# Patient Record
Sex: Female | Born: 1950
Health system: Southern US, Community
[De-identification: ages and names within clinical notes are randomized; demographics above are authoritative.]

## PROBLEM LIST (undated history)

## (undated) DIAGNOSIS — K219 Gastro-esophageal reflux disease without esophagitis: Secondary | ICD-10-CM

## (undated) DIAGNOSIS — I509 Heart failure, unspecified: Secondary | ICD-10-CM

## (undated) DIAGNOSIS — G8929 Other chronic pain: Secondary | ICD-10-CM

## (undated) DIAGNOSIS — R351 Nocturia: Secondary | ICD-10-CM

## (undated) DIAGNOSIS — R5383 Other fatigue: Secondary | ICD-10-CM

## (undated) DIAGNOSIS — I10 Essential (primary) hypertension: Secondary | ICD-10-CM

## (undated) DIAGNOSIS — J449 Chronic obstructive pulmonary disease, unspecified: Secondary | ICD-10-CM

## (undated) DIAGNOSIS — R609 Edema, unspecified: Secondary | ICD-10-CM

## (undated) DIAGNOSIS — M549 Dorsalgia, unspecified: Secondary | ICD-10-CM

## (undated) DIAGNOSIS — M797 Fibromyalgia: Secondary | ICD-10-CM

## (undated) DIAGNOSIS — M1990 Unspecified osteoarthritis, unspecified site: Secondary | ICD-10-CM

## (undated) DIAGNOSIS — G4730 Sleep apnea, unspecified: Secondary | ICD-10-CM

## (undated) DIAGNOSIS — D649 Anemia, unspecified: Secondary | ICD-10-CM

## (undated) HISTORY — PX: VESICOVAGINAL FISTULA CLOSURE W/ TAH: SUR271

## (undated) HISTORY — PX: CHOLECYSTECTOMY: SHX55

## (undated) HISTORY — DX: Edema, unspecified: R60.9

## (undated) HISTORY — DX: Sleep apnea, unspecified: G47.30

## (undated) HISTORY — DX: Fibromyalgia: M79.7

## (undated) HISTORY — PX: TOTAL KNEE ARTHROPLASTY: SHX125

## (undated) HISTORY — DX: Gastro-esophageal reflux disease without esophagitis: K21.9

## (undated) HISTORY — DX: Unspecified osteoarthritis, unspecified site: M19.90

## (undated) HISTORY — DX: Nocturia: R35.1

## (undated) HISTORY — PX: ABDOMINAL HYSTERECTOMY: SHX81

## (undated) HISTORY — DX: Essential (primary) hypertension: I10

## (undated) HISTORY — DX: Anemia, unspecified: D64.9

## (undated) HISTORY — DX: Other fatigue: R53.83

---

## 2001-05-30 ENCOUNTER — Other Ambulatory Visit: Admission: RE | Admit: 2001-05-30 | Discharge: 2001-05-30 | Payer: Self-pay | Admitting: Family Medicine

## 2004-06-21 ENCOUNTER — Ambulatory Visit: Payer: Self-pay

## 2004-06-30 ENCOUNTER — Ambulatory Visit: Payer: Self-pay

## 2005-02-11 ENCOUNTER — Ambulatory Visit: Payer: Self-pay

## 2005-03-22 ENCOUNTER — Ambulatory Visit: Payer: Self-pay | Admitting: Pain Medicine

## 2005-03-24 ENCOUNTER — Ambulatory Visit: Payer: Self-pay | Admitting: Urology

## 2005-04-20 ENCOUNTER — Ambulatory Visit: Payer: Self-pay | Admitting: Pain Medicine

## 2005-05-04 ENCOUNTER — Ambulatory Visit: Payer: Self-pay | Admitting: Physician Assistant

## 2005-05-18 ENCOUNTER — Ambulatory Visit: Payer: Self-pay | Admitting: Physician Assistant

## 2005-06-16 ENCOUNTER — Ambulatory Visit: Payer: Self-pay | Admitting: Physician Assistant

## 2005-06-17 ENCOUNTER — Emergency Department: Payer: Self-pay | Admitting: Emergency Medicine

## 2005-07-04 ENCOUNTER — Other Ambulatory Visit: Payer: Self-pay

## 2005-07-04 ENCOUNTER — Ambulatory Visit: Payer: Self-pay | Admitting: General Surgery

## 2005-07-10 ENCOUNTER — Ambulatory Visit: Payer: Self-pay | Admitting: General Surgery

## 2005-09-08 ENCOUNTER — Ambulatory Visit: Payer: Self-pay | Admitting: Physician Assistant

## 2005-12-04 ENCOUNTER — Ambulatory Visit: Payer: Self-pay | Admitting: Physician Assistant

## 2006-01-01 ENCOUNTER — Ambulatory Visit: Payer: Self-pay | Admitting: Physician Assistant

## 2006-02-01 ENCOUNTER — Ambulatory Visit: Payer: Self-pay | Admitting: Physician Assistant

## 2006-03-15 ENCOUNTER — Ambulatory Visit: Payer: Self-pay | Admitting: Physician Assistant

## 2006-03-15 ENCOUNTER — Emergency Department: Payer: Self-pay | Admitting: Internal Medicine

## 2006-06-11 ENCOUNTER — Ambulatory Visit: Payer: Self-pay | Admitting: Physician Assistant

## 2006-09-10 ENCOUNTER — Ambulatory Visit: Payer: Self-pay | Admitting: Physician Assistant

## 2006-12-06 ENCOUNTER — Ambulatory Visit: Payer: Self-pay | Admitting: Physician Assistant

## 2007-02-05 ENCOUNTER — Ambulatory Visit: Payer: Self-pay | Admitting: Physician Assistant

## 2007-05-13 ENCOUNTER — Ambulatory Visit: Payer: Self-pay | Admitting: Physician Assistant

## 2007-07-02 ENCOUNTER — Ambulatory Visit: Payer: Self-pay | Admitting: Family Medicine

## 2007-08-08 ENCOUNTER — Ambulatory Visit: Payer: Self-pay | Admitting: Physician Assistant

## 2007-08-24 ENCOUNTER — Emergency Department: Payer: Self-pay | Admitting: Emergency Medicine

## 2007-09-04 ENCOUNTER — Emergency Department: Payer: Self-pay | Admitting: Emergency Medicine

## 2007-10-09 ENCOUNTER — Ambulatory Visit: Payer: Self-pay | Admitting: Physician Assistant

## 2007-11-01 ENCOUNTER — Emergency Department: Payer: Self-pay | Admitting: Emergency Medicine

## 2007-11-05 ENCOUNTER — Ambulatory Visit: Payer: Self-pay | Admitting: Physician Assistant

## 2007-12-05 ENCOUNTER — Ambulatory Visit: Payer: Self-pay | Admitting: Physician Assistant

## 2008-07-02 ENCOUNTER — Ambulatory Visit: Payer: Self-pay | Admitting: Family Medicine

## 2009-02-03 ENCOUNTER — Emergency Department (HOSPITAL_COMMUNITY): Admission: EM | Admit: 2009-02-03 | Discharge: 2009-02-03 | Payer: Self-pay | Admitting: Emergency Medicine

## 2009-11-04 ENCOUNTER — Emergency Department: Payer: Self-pay | Admitting: Emergency Medicine

## 2010-01-13 ENCOUNTER — Ambulatory Visit: Payer: Self-pay | Admitting: Family Medicine

## 2010-10-13 ENCOUNTER — Ambulatory Visit: Payer: Self-pay | Admitting: Family Medicine

## 2010-12-12 ENCOUNTER — Ambulatory Visit: Payer: Self-pay | Admitting: Family Medicine

## 2010-12-28 ENCOUNTER — Ambulatory Visit: Payer: Self-pay | Admitting: Gastroenterology

## 2011-04-18 ENCOUNTER — Ambulatory Visit: Payer: Self-pay | Admitting: Gastroenterology

## 2011-04-26 ENCOUNTER — Ambulatory Visit: Payer: Self-pay

## 2012-01-24 ENCOUNTER — Encounter: Payer: Self-pay | Admitting: Pulmonary Disease

## 2012-01-25 ENCOUNTER — Institutional Professional Consult (permissible substitution): Payer: Self-pay | Admitting: Pulmonary Disease

## 2012-05-23 ENCOUNTER — Ambulatory Visit: Payer: Self-pay | Admitting: Family Medicine

## 2012-06-08 ENCOUNTER — Emergency Department: Payer: Self-pay | Admitting: Emergency Medicine

## 2012-06-08 LAB — COMPREHENSIVE METABOLIC PANEL
Alkaline Phosphatase: 202 U/L — ABNORMAL HIGH (ref 50–136)
Bilirubin,Total: 0.7 mg/dL (ref 0.2–1.0)
Co2: 26 mmol/L (ref 21–32)
Osmolality: 282 (ref 275–301)
SGPT (ALT): 68 U/L (ref 12–78)
Sodium: 141 mmol/L (ref 136–145)
Total Protein: 8.2 g/dL (ref 6.4–8.2)

## 2012-06-08 LAB — CBC
HCT: 41.7 % (ref 35.0–47.0)
MCHC: 32 g/dL (ref 32.0–36.0)
WBC: 5.7 10*3/uL (ref 3.6–11.0)

## 2012-06-08 LAB — URINALYSIS, COMPLETE
Bilirubin,UR: NEGATIVE
Glucose,UR: NEGATIVE mg/dL (ref 0–75)
Ketone: NEGATIVE
Protein: NEGATIVE
Specific Gravity: 1.017 (ref 1.003–1.030)
Squamous Epithelial: 2
WBC UR: 9 /HPF (ref 0–5)

## 2013-05-19 ENCOUNTER — Inpatient Hospital Stay: Payer: Self-pay | Admitting: Internal Medicine

## 2013-05-19 LAB — COMPREHENSIVE METABOLIC PANEL
ALBUMIN: 3.1 g/dL — AB (ref 3.4–5.0)
ALT: 34 U/L (ref 12–78)
AST: 35 U/L (ref 15–37)
Alkaline Phosphatase: 162 U/L — ABNORMAL HIGH
Anion Gap: 4 — ABNORMAL LOW (ref 7–16)
BILIRUBIN TOTAL: 1.2 mg/dL — AB (ref 0.2–1.0)
BUN: 16 mg/dL (ref 7–18)
CALCIUM: 8.9 mg/dL (ref 8.5–10.1)
Chloride: 107 mmol/L (ref 98–107)
Co2: 26 mmol/L (ref 21–32)
Creatinine: 0.72 mg/dL (ref 0.60–1.30)
EGFR (Non-African Amer.): >60
Glucose: 109 mg/dL — ABNORMAL HIGH (ref 65–99)
Osmolality: 276 (ref 275–301)
POTASSIUM: 3.9 mmol/L (ref 3.5–5.1)
SODIUM: 137 mmol/L (ref 136–145)
TOTAL PROTEIN: 7.2 g/dL (ref 6.4–8.2)

## 2013-05-19 LAB — CBC
HCT: 37 %
HGB: 11.9 g/dL — ABNORMAL LOW
MCH: 29.7 pg
MCHC: 32.2 g/dL
MCV: 92 fL
Platelet: 190 x10 3/mm 3
RBC: 4.01 X10 6/mm 3
RDW: 15.7 % — ABNORMAL HIGH
WBC: 7.5 x10 3/mm 3

## 2013-05-19 LAB — URINALYSIS, COMPLETE
BLOOD: NEGATIVE
GLUCOSE, UR: NEGATIVE mg/dL (ref 0–75)
Ketone: NEGATIVE
NITRITE: NEGATIVE
Ph: 5 (ref 4.5–8.0)
SPECIFIC GRAVITY: 1.017 (ref 1.003–1.030)
SQUAMOUS EPITHELIAL: NONE SEEN

## 2013-05-19 LAB — PRO B NATRIURETIC PEPTIDE: B-Type Natriuretic Peptide: 61 pg/mL

## 2013-05-19 LAB — TROPONIN I: Troponin-I: <0.02 ng/mL

## 2013-05-19 LAB — LIPASE, BLOOD: Lipase: 69 U/L — ABNORMAL LOW

## 2013-05-20 LAB — CBC WITH DIFFERENTIAL/PLATELET
BASOS ABS: 0.1 10*3/uL (ref 0.0–0.1)
BASOS PCT: 0.6 %
EOS ABS: 0 10*3/uL (ref 0.0–0.7)
Eosinophil %: 0 %
HCT: 36.1 % (ref 35.0–47.0)
HGB: 11.2 g/dL — ABNORMAL LOW (ref 12.0–16.0)
LYMPHS PCT: 9.7 %
Lymphocyte #: 0.9 10*3/uL — ABNORMAL LOW (ref 1.0–3.6)
MCH: 28.9 pg (ref 26.0–34.0)
MCHC: 31.1 g/dL — ABNORMAL LOW (ref 32.0–36.0)
MCV: 93 fL (ref 80–100)
MONOS PCT: 4.1 %
Monocyte #: 0.4 x10 3/mm (ref 0.2–0.9)
Neutrophil #: 8.1 10*3/uL — ABNORMAL HIGH (ref 1.4–6.5)
Neutrophil %: 85.6 %
PLATELETS: 222 10*3/uL (ref 150–440)
RBC: 3.88 10*6/uL (ref 3.80–5.20)
RDW: 15.9 % — AB (ref 11.5–14.5)
WBC: 9.4 10*3/uL (ref 3.6–11.0)

## 2013-05-20 LAB — BASIC METABOLIC PANEL
Anion Gap: 5 — ABNORMAL LOW (ref 7–16)
BUN: 22 mg/dL — AB (ref 7–18)
CHLORIDE: 108 mmol/L — AB (ref 98–107)
CREATININE: 1.05 mg/dL (ref 0.60–1.30)
Calcium, Total: 9 mg/dL (ref 8.5–10.1)
Co2: 25 mmol/L (ref 21–32)
EGFR (African American): >60
GFR CALC NON AF AMER: 58 — AB
GLUCOSE: 159 mg/dL — AB (ref 65–99)
Osmolality: 282 (ref 275–301)
Potassium: 4.6 mmol/L (ref 3.5–5.1)
Sodium: 138 mmol/L (ref 136–145)

## 2013-05-20 LAB — TROPONIN I

## 2013-05-22 LAB — BASIC METABOLIC PANEL
Anion Gap: 2 — ABNORMAL LOW (ref 7–16)
BUN: 21 mg/dL — AB (ref 7–18)
CO2: 32 mmol/L (ref 21–32)
CREATININE: 0.9 mg/dL (ref 0.60–1.30)
Calcium, Total: 9.1 mg/dL (ref 8.5–10.1)
Chloride: 104 mmol/L (ref 98–107)
EGFR (African American): >60
EGFR (Non-African Amer.): >60
Glucose: 152 mg/dL — ABNORMAL HIGH (ref 65–99)
Osmolality: 282 (ref 275–301)
POTASSIUM: 4.1 mmol/L (ref 3.5–5.1)
SODIUM: 138 mmol/L (ref 136–145)

## 2013-05-23 LAB — HEPATIC FUNCTION PANEL A (ARMC)
ALBUMIN: 3.1 g/dL — AB (ref 3.4–5.0)
ALK PHOS: 134 U/L — AB
BILIRUBIN TOTAL: 0.5 mg/dL (ref 0.2–1.0)
Bilirubin, Direct: 0.2 mg/dL (ref 0.00–0.20)
SGOT(AST): 29 U/L (ref 15–37)
SGPT (ALT): 35 U/L (ref 12–78)
TOTAL PROTEIN: 7.5 g/dL (ref 6.4–8.2)

## 2013-05-26 LAB — PATHOLOGY REPORT

## 2013-07-28 ENCOUNTER — Ambulatory Visit: Payer: Self-pay | Admitting: Family Medicine

## 2013-08-04 ENCOUNTER — Emergency Department: Payer: Self-pay | Admitting: Emergency Medicine

## 2013-08-04 LAB — URINALYSIS, COMPLETE
BLOOD: NEGATIVE
Bacteria: NONE SEEN
Bilirubin,UR: NEGATIVE
GLUCOSE, UR: NEGATIVE mg/dL (ref 0–75)
Ketone: NEGATIVE
LEUKOCYTE ESTERASE: NEGATIVE
NITRITE: NEGATIVE
PH: 5 (ref 4.5–8.0)
Protein: NEGATIVE
Specific Gravity: 1.015 (ref 1.003–1.030)
WBC UR: 1 /HPF (ref 0–5)

## 2013-08-04 LAB — COMPREHENSIVE METABOLIC PANEL
ALK PHOS: 134 U/L — AB
ALT: 29 U/L (ref 12–78)
Albumin: 3.3 g/dL — ABNORMAL LOW (ref 3.4–5.0)
Anion Gap: 5 — ABNORMAL LOW (ref 7–16)
BILIRUBIN TOTAL: 0.6 mg/dL (ref 0.2–1.0)
BUN: 9 mg/dL (ref 7–18)
CALCIUM: 9.5 mg/dL (ref 8.5–10.1)
Chloride: 104 mmol/L (ref 98–107)
Co2: 30 mmol/L (ref 21–32)
Creatinine: 0.7 mg/dL (ref 0.60–1.30)
EGFR (African American): >60
EGFR (Non-African Amer.): >60
GLUCOSE: 109 mg/dL — AB (ref 65–99)
OSMOLALITY: 277 (ref 275–301)
Potassium: 3.8 mmol/L (ref 3.5–5.1)
SGOT(AST): 22 U/L (ref 15–37)
Sodium: 139 mmol/L (ref 136–145)
TOTAL PROTEIN: 7.3 g/dL (ref 6.4–8.2)

## 2013-08-04 LAB — CBC WITH DIFFERENTIAL/PLATELET
BASOS PCT: 1.1 %
Basophil #: 0 10*3/uL (ref 0.0–0.1)
EOS PCT: 3 %
Eosinophil #: 0.1 10*3/uL (ref 0.0–0.7)
HCT: 38.6 % (ref 35.0–47.0)
HGB: 12.5 g/dL (ref 12.0–16.0)
Lymphocyte #: 1.9 10*3/uL (ref 1.0–3.6)
Lymphocyte %: 44.9 %
MCH: 30.5 pg (ref 26.0–34.0)
MCHC: 32.4 g/dL (ref 32.0–36.0)
MCV: 94 fL (ref 80–100)
Monocyte #: 0.6 x10 3/mm (ref 0.2–0.9)
Monocyte %: 15 %
NEUTROS ABS: 1.5 10*3/uL (ref 1.4–6.5)
Neutrophil %: 36 %
PLATELETS: 217 10*3/uL (ref 150–440)
RBC: 4.1 10*6/uL (ref 3.80–5.20)
RDW: 16.1 % — AB (ref 11.5–14.5)
WBC: 4.1 10*3/uL (ref 3.6–11.0)

## 2013-08-04 LAB — PROTIME-INR
INR: 1
PROTHROMBIN TIME: 12.7 s (ref 11.5–14.7)

## 2013-08-04 LAB — HEMOGLOBIN A1C: Hemoglobin A1C: 5.6 % (ref 4.2–6.3)

## 2013-11-12 ENCOUNTER — Ambulatory Visit: Payer: Self-pay | Admitting: Family Medicine

## 2014-01-16 ENCOUNTER — Ambulatory Visit: Payer: Self-pay | Admitting: Family Medicine

## 2014-03-02 ENCOUNTER — Inpatient Hospital Stay: Payer: Self-pay | Admitting: Internal Medicine

## 2014-03-02 LAB — CBC
HCT: 35.7 % (ref 35.0–47.0)
HGB: 11.1 g/dL — AB (ref 12.0–16.0)
MCH: 29.3 pg (ref 26.0–34.0)
MCHC: 31.1 g/dL — AB (ref 32.0–36.0)
MCV: 94 fL (ref 80–100)
PLATELETS: 210 10*3/uL (ref 150–440)
RBC: 3.79 10*6/uL — AB (ref 3.80–5.20)
RDW: 16.1 % — AB (ref 11.5–14.5)
WBC: 4.3 10*3/uL (ref 3.6–11.0)

## 2014-03-02 LAB — COMPREHENSIVE METABOLIC PANEL
ALK PHOS: 120 U/L — AB
ALT: 25 U/L
ANION GAP: 4 — AB (ref 7–16)
AST: 22 U/L (ref 15–37)
Albumin: 2.9 g/dL — ABNORMAL LOW (ref 3.4–5.0)
BUN: 9 mg/dL (ref 7–18)
Bilirubin,Total: 0.6 mg/dL (ref 0.2–1.0)
CHLORIDE: 108 mmol/L — AB (ref 98–107)
CO2: 32 mmol/L (ref 21–32)
CREATININE: 0.81 mg/dL (ref 0.60–1.30)
Calcium, Total: 8.8 mg/dL (ref 8.5–10.1)
EGFR (African American): >60
EGFR (Non-African Amer.): >60
GLUCOSE: 112 mg/dL — AB (ref 65–99)
Osmolality: 286 (ref 275–301)
POTASSIUM: 4.1 mmol/L (ref 3.5–5.1)
SODIUM: 144 mmol/L (ref 136–145)
TOTAL PROTEIN: 6.6 g/dL (ref 6.4–8.2)

## 2014-03-02 LAB — TROPONIN I

## 2014-03-02 LAB — PRO B NATRIURETIC PEPTIDE: B-TYPE NATIURETIC PEPTID: 128 pg/mL — AB (ref 0–125)

## 2014-03-04 DIAGNOSIS — R079 Chest pain, unspecified: Secondary | ICD-10-CM

## 2014-03-04 LAB — TROPONIN I

## 2014-03-20 ENCOUNTER — Emergency Department: Payer: Self-pay | Admitting: Emergency Medicine

## 2014-03-20 LAB — COMPREHENSIVE METABOLIC PANEL
ALK PHOS: 121 U/L — AB
Albumin: 3 g/dL — ABNORMAL LOW (ref 3.4–5.0)
Anion Gap: 5 — ABNORMAL LOW (ref 7–16)
BILIRUBIN TOTAL: 0.5 mg/dL (ref 0.2–1.0)
BUN: 13 mg/dL (ref 7–18)
CHLORIDE: 106 mmol/L (ref 98–107)
CO2: 31 mmol/L (ref 21–32)
CREATININE: 1.13 mg/dL (ref 0.60–1.30)
Calcium, Total: 9.2 mg/dL (ref 8.5–10.1)
GFR CALC NON AF AMER: 52 — AB
Glucose: 127 mg/dL — ABNORMAL HIGH (ref 65–99)
OSMOLALITY: 285 (ref 275–301)
Potassium: 4.2 mmol/L (ref 3.5–5.1)
SGOT(AST): 31 U/L (ref 15–37)
SGPT (ALT): 42 U/L
SODIUM: 142 mmol/L (ref 136–145)
TOTAL PROTEIN: 7 g/dL (ref 6.4–8.2)

## 2014-03-20 LAB — URINALYSIS, COMPLETE
BACTERIA: NONE SEEN
Blood: NEGATIVE
GLUCOSE, UR: NEGATIVE mg/dL (ref 0–75)
Hyaline Cast: 3
Ketone: NEGATIVE
LEUKOCYTE ESTERASE: NEGATIVE
NITRITE: NEGATIVE
Ph: 5 (ref 4.5–8.0)
Protein: 30
RBC,UR: 1 /HPF (ref 0–5)
SPECIFIC GRAVITY: 1.025 (ref 1.003–1.030)
Squamous Epithelial: 7

## 2014-03-20 LAB — CBC
HCT: 39.2 % (ref 35.0–47.0)
HGB: 12.2 g/dL (ref 12.0–16.0)
MCH: 29.1 pg (ref 26.0–34.0)
MCHC: 31.2 g/dL — AB (ref 32.0–36.0)
MCV: 93 fL (ref 80–100)
Platelet: 185 10*3/uL (ref 150–440)
RBC: 4.21 10*6/uL (ref 3.80–5.20)
RDW: 17 % — AB (ref 11.5–14.5)
WBC: 6.1 10*3/uL (ref 3.6–11.0)

## 2014-03-20 LAB — CK TOTAL AND CKMB (NOT AT ARMC)
CK, Total: 378 U/L — ABNORMAL HIGH (ref 26–192)
CK-MB: <0.5 ng/mL — ABNORMAL LOW (ref 0.5–3.6)

## 2014-03-20 LAB — PRO B NATRIURETIC PEPTIDE: B-Type Natriuretic Peptide: 39 pg/mL (ref 0–125)

## 2014-03-20 LAB — TROPONIN I

## 2014-03-21 LAB — AMMONIA: Ammonia, Plasma: 28 mcmol/L (ref 11–32)

## 2014-04-27 ENCOUNTER — Emergency Department: Payer: Self-pay | Admitting: Emergency Medicine

## 2014-05-27 ENCOUNTER — Other Ambulatory Visit: Payer: Self-pay | Admitting: *Deleted

## 2014-05-27 NOTE — Patient Outreach (Signed)
Phone call to patient to assess for continued social work needs.  Patient continues to be active with home health.  Patient has recently moved to [facility] senior housing through the [employer].  Patient continues to grieve the death of her daughter and feels that she is in need of mental health counseling  Contact numbers for community mental health agencies to be mailed to patient as well as contact information for [facility].     [place], [facility] [phone]

## 2014-06-08 ENCOUNTER — Other Ambulatory Visit: Payer: Self-pay | Admitting: *Deleted

## 2014-06-08 NOTE — Patient Outreach (Signed)
Follow up phone call:  Pt states [place] and aide stopped last week.  Pt states nurse is to come one day this week, don't think it will be long.  Pt states HHPT started back today.  Pt states MD did give refill on her Lasix, took one this morning.Pt states breathing is the same. Pt states saw MD in February.  Pt states she has pain every day, even with Lyrica, no change. Pt states she is taking all of her medicines.  RN CM discussed with pt doing a home visit this month to assess needs (COPD) to which pt agreed.  Home visit scheduled for 4/15.

## 2014-06-11 ENCOUNTER — Ambulatory Visit: Payer: Medicare PPO | Admitting: *Deleted

## 2014-06-18 ENCOUNTER — Other Ambulatory Visit: Payer: Self-pay | Admitting: *Deleted

## 2014-06-18 ENCOUNTER — Ambulatory Visit: Payer: Self-pay | Admitting: *Deleted

## 2014-06-18 NOTE — Patient Outreach (Signed)
F/u on call received earlier from pt to [facility] office, requesting a call from RN CM to reschedule home visit for tomorrow.  HIPPA compliant voice message left.

## 2014-06-18 NOTE — Patient Outreach (Signed)
Pt returned call shortly after RN CM left voice message, f/u on call made by pt earlier to [facility] office to reschedule home visit for tomorrow with RN CM.   Pt states nurse from [facility] is coming out tomorrow, to call her tonight and let her know the time.    Pt states RN CM can still come out tomorrow.    As discussed with pt, after talking with [place], if need to reschedule tomorrow's visit with RN CM to call.  Otherwise, plan to f/u with home visit tomorrow as planned.      Zara Chess.   [place] [facility]  [phone]

## 2014-06-19 ENCOUNTER — Ambulatory Visit: Payer: Medicare PPO | Admitting: *Deleted

## 2014-06-27 NOTE — Consult Note (Signed)
Breathing marginal. EGD showed mild esophagitis. No obvious Barrett's seen but 4 quadrant biopsies taken. Stomach looked normal. Continue PPI bid. If LFT continues to be elevated, then get MRCP to evaluate CBD. Otherwise, will check back on Monday. Thanks.  Electronic Signatures: Verdie Shire (MD)  (Signed on 20-Mar-15 11:22)  Authored  Last Updated: 20-Mar-15 11:22 by Verdie Shire (MD)

## 2014-06-27 NOTE — Discharge Summary (Signed)
PATIENT NAME:  Loretta Wade, BOLAM MR#:  [id number] DATE OF BIRTH:  February 04, 1951  DATE OF ADMISSION:  03/02/2014 DATE OF DISCHARGE:  03/05/2014  DISCHARGE DIAGNOSES: 1.  Acute chronic obstructive pulmonary disease exacerbation. 2.  Chest pain, pleuritic in nature, doubt cardiac. Negative cardiac enzymes and Myoview.  SECONDARY DIAGNOSES: 1.  Sleep apnea, using CPAP at night. 2.  Chronic obstructive pulmonary disease.  3.  Gastroesophageal reflux disease.  4.  Hypertension.   CONSULTATION:  1.  Cardiology, Dr. Dorothyann Peng.  2.  Physical therapy and speech therapy.   PROCEDURES AND RADIOLOGY: Myoview on 31st of December was negative.   Left shoulder x-ray on 30th of December showed no evidence of acute abnormality. Glenohumeral osteophyte raising concern for chronic rotator cuff injury.   Chest x-ray on the 28th of December showed mild pulmonary vascular congestion. No focal consolidation.  HISTORY AND SHORT HOSPITAL COURSE: The patient is a 64 year old female with the above-mentioned medical problems who was admitted for shortness of breath. She was found to have COPD exacerbation. She also had some pleuritic chest pain. The ongoing chest pain made her concerned and wanted to have cardiology evaluation for which cardiology consultation was obtained with Dr. Dorothyann Peng who recommended Myoview, which was obtained, with the results dictated above, essentially negative. She also had negative serial cardiac enzymes. She was feeling much better by the 31st of December, was back to her baseline with breathing much improved.   PERTINENT DISCHARGE PHYSICAL EXAMINATION: VITAL SIGNS: On the day of discharge, her temperature was 97.7, heart rate 61 per minute, respirations 19 per minute, blood pressure 147/87 and she was saturating 93% on 2 liters oxygen via nasal cannula. CARDIOVASCULAR: S1 and S2 normal. No murmurs, rubs or gallops.  LUNGS: Decreased breath sounds at the bases. No wheezing,  rales, rhonchi, or crepitation.  ABDOMEN: Soft, obese, benign.  NEUROLOGIC: Nonfocal examination. All other physical examination remained at baseline.  DISCHARGE MEDICATIONS:   Medication Instructions  propranolol extended release 80 mg oral capsule, extended release  1 cap(s) orally once a day   tiotropium 18 mcg inhalation capsule  1 cap(s) inhaled once a day   duoneb 0.5 mg-2.5 mg/3 ml inhalation solution  3 milliliter(s) inhaled 4 times a day, As Needed   lyrica 150 mg oral capsule  1 cap(s) orally 2 times a day   gabapentin 300 mg oral capsule  1 cap(s) orally once a day (at bedtime)   requip 0.25 mg oral tablet  1 tab(s) orally 3 times a day   etodolac 400 mg oral tablet  1 tab(s) orally 2 times a day   benicar 20 mg oral tablet  1 tab(s) orally once a day   dexilant 60 mg oral delayed release capsule  1 cap(s) orally once a day   furosemide 20 mg oral tablet  1 tab(s) orally once a day   breo ellipta 100 mcg-25 mcg/inh inhalation powder  1 puff(s) inhaled 2 times a day   ventolin hfa cfc free 90 mcg/inh inhalation aerosol  1-3 puff(s) inhaled every 6 hours, As Needed   oxymetazoline nasal 0.05% nasal spray  1 spray(s) nasal 2 times a day, As Needed   pantoprazole 40 mg oral delayed release tablet  1 tab(s) orally once a day   myrbetriq 25 mg oral tablet, extended release  1 tab(s) orally once a day   prednisone 10 mg oral tablet  Start at 60 mg and taper by 10 mg daily until complete   ibuprofen  600 mg oral tablet  1 tab(s) orally 3 times a day x 5 days, As Needed - for Pain    DISCHARGE DIET: Low sodium, low fat, low cholesterol.  DISCHARGE ACTIVITY: As tolerated.   DISCHARGE INSTRUCTIONS AND FOLLOW-UP: The patient was instructed to follow up with her primary care physician, Dr. Brayton El, in 1 to 2 weeks. She will need follow-up with [facility] cardiology in 2 to 4 weeks. She remains at very high risk for readmission. She normally uses 2 liters oxygen via nasal cannula  continuously at baseline for her chronic oxygen requirement.  TOTAL TIME DISCHARGING PATIENT: 45 minutes. ____________________________ Ellamae Sia. Sherryll Burger, MD vss:sb D: 03/05/2014 14:07:42 ET T: 03/05/2014 14:39:01 ET JOB#: [id number]  cc: Ermie Glendenning S. Sherryll Burger, MD, <Dictator> Velta Addison, MD [facility]  Ellamae Sia [facility] MD ELECTRONICALLY SIGNED 03/05/2014 15:26

## 2014-06-27 NOTE — Consult Note (Signed)
PATIENT NAME:  Loretta Wade, SCHRAEDER MR#:  [id number] DATE OF BIRTH:  Apr 19, 1950  DATE OF CONSULTATION:  05/22/2013  ATTENDING PHYSICIAN:  Dr. Vianne Bulls   CONSULTING PHYSICIAN:  Dr. Joen Laura, NP  PRIMARY CARE PHYSICIAN:  Dr. Keith Rake.  HISTORY OF PRESENT ILLNESS: Ms. Shere is a 64 year old Caucasian female who presented to [facility] Emergency Room on 05/20/2003, for the chief complaint of trouble breathing. Medical history is significant for COPD, GERD, morbid obesity, sleep apnea and hypertension. The patient had been experiencing dyspnea with a cough and chest pain for 2 weeks. She tried inhalers and nebulizers at home with no relief. She was brought in by EMS because of left arm pain and breast pain. O2 sats are 86, 87 on room air. On 3 liters she was started to sat at 90% to 92% and during hospitalization has been up to the range of 94% on 3 liters. She has been experiencing abdominal pain, which has been present for the past 2 to 3 months intermittent in presentation but does state it can be constant for several days in time. Her primary gastroenterologist is Dr. Loistine Simas. She has been on Dexilant 60 mg once a day. States for the past 3 weeks at least so it just seems that the medication is not working. Her weight does seem to go up and down. The patient describes the pain as sharp. It is worse after eating. States even can start to occur while she is eating to the point to where she will get very nauseated and actually regurgitate undigested food. There is no history of diabetes. Pain medications were Lyrica, gabapentin and narcotic use. Prior to being hospitalized, she was having diarrhea and that stopped on Tuesday. Actually had a bowel movement prior to my interviewing process today and states that with cleansing there is bright red blood. Stool has been black at times with a gold appearance. A couple of weeks ago, she was placed on antibiotics for a sinus infection, unable to  tolerate the first antibiotic, unknown name, and that was changed to amoxicillin and during this course of time is when the diarrhea started but no nocturnal bowel movements.   PAST MEDICAL HISTORY: 1.  GERD. 2.  COPD. 3.  Morbid obesity. 4.  Sleep apnea. 5.  Hypertension. 6.  History of fibromyalgia.  HOME MEDICATIONS: Advair 250/50 one puff b.i.d., Benicar 5 mg a day, Dexilant 60 mg a day, Etodolac 400 mg twice a day, Neurontin 3 mg a day, Lyrica 150 mg twice a day, ProAir 90 mcg inhalation 2 puffs every 6 hours and propranolol extended-release 80 mg a day.   ALLERGIES: VIOXX.   SOCIAL HISTORY: Previous smoker. Smoked for 40 to 50 years, quit and has since 2008. No alcohol. No recreational drug use. Resides by herself.   PAST SURGICAL HISTORY: Total right knee replacement, cholecystectomy and total abdominal hysterectomy.   FAMILY HISTORY: No diabetes. No hypertension.   REVIEW OF SYSTEMS: CONSTITUTIONAL: Significant for fever-like feeling at home with chills.  EYES: No blurred vision. ENT: No tinnitus, epistaxis. No difficulty swallowing.  RESPIRATORY: See HPI.  CARDIOVASCULAR: Some chest discomfort due to the dyspnea. No heart palpitations.  GASTROINTESTINAL: See HPI.  GENITOURINARY: No dysuria or hematuria.  ENDOCRINE: No polyuria or nocturia.  INTEGUMENTARY: No rashes. No lesions.  MUSCULOSKELETAL: Significant for generalized joint pain. She also has a history of fibromyalgia. NEUROLOGIC: No history of CVA or TIA.  PSYCHIATRIC: No depression. No anxiety.   PHYSICAL EXAMINATION: VITAL  SIGNS: Temperature is 98.2 with a pulse of 80, respirations 18, blood pressure 165/69 with a pulse ox of 93% on O2.  GENERAL: Well developed, morbidly obese female, no acute distress noted. Appears to be resting comfortably in bed.  HEENT: Normocephalic, atraumatic. Pupils equal, reactive to light. Conjunctivae clear. Sclerae anicteric. She had glasses.  NECK: Supple. Trachea midline. No  lymphadenopathy or thyromegaly.  PULMONARY: Decreased breath sounds noted throughout. Scattered expiratory wheezes. Poor respiratory effort.  CARDIOVASCULAR: Regular rhythm, S1, S2. No murmurs. No gallops.  ABDOMEN: A very large, obtunded abdomen. No distention. Marked discomfort to right upper quadrant epigastric caused her to be tearful. Bowel sounds in 4 quadrants. No bruits. No masses, but difficulty due to body habitus size to truly assess.  RECTAL: Deferred.  MUSCULOSKELETAL: No contractures. No clubbing.  EXTREMITIES: +1 edema.  PSYCHIATRIC: Alert and oriented x 4. Memory grossly intact.  NEUROLOGICAL: No gross neurological deficits.  LABORATORY AND DIAGNOSTIC:  Glucose was 109. Anion gap was low at 4. On admission lipase was 69. Comparison today's date, glucose has risen to 152. BUN has risen to 21. Creatinine has remained within normal range. Hepatic panel: Total protein is 7.2 with an albumin of 3.1. Total bilirubin is 1.2 with alk phos of 162. AST is 35 and ALT is 34. Troponin has been less than 0.02. CBC: Hemoglobin was low at 11.9 on admission, has dropped to 11.2. WBC count has remained within normal limits. Urinalysis revealed +1 bilirubin, protein is 30 g/dL, +3 leukocytes. RBC is 10 per high-power field. WBC is 660 per high-power field, +3 bacteria. ABG revealed a pH of 7.33 with a pCO2 of 49 and O2 of 130. Bicarb 25.8.  EKG was sinus rhythm with PACs.   Chest, PA and lateral, revealed limited study by patient's large body habitus size. Hazy left bibasilar atelectasis or infiltrate, cardiomegaly, central vascular congestion without convincing pulmonary edema.  Abdominal ultrasound, which was done yesterday, revealed gallbladder to be surgically absent. Echogenic liver compatible with hepatic steatosis. No focal liver lesion. Negative for ascites, thus diagnosis is hepatic steatosis. Common bile duct was normal in caliber.  The patient did have colonoscopy which was done by Dr.  Loistine Simas on 04/18/2011, in which 2 polyps were resected from descending colon. Two polyps were resected from distal sigmoid colon and diverticulosis noted. Upper endoscopy revealed esophageal mucosal changes suspicious for a short segment of Barrett esophagus. Normal stomach, normal duodenum. Biopsies were obtained.  Pathology revealed antral mucosa with reactive epithelial changes, gastric fundic mucosa with mild chronic inflammation, negative for Helicobacter, dysplasia or malignancy.   Esophageal biopsy, though, did reveal evidence of reflux, as well as consistent with Barrett esophagus.  Biopsies were all hyperplastic for colonic polyps, which were removed.   IMPRESSION: 1.  Known history of chronic obstructive pulmonary disease.  2.  Known history of reflux with history of Barrett esophagus. 3.  Marked abdominal pain, right upper quadrant epigastric intermittently for the past 2 to 3 months which has not been well controlled with proton pump inhibitor therapy at this time. The patient does have a known history of chronic anti-inflammatory use.   PLAN: The patient's presentation was discussed with Dr. Verdie Shire. Recommendation is to proceed forward with an upper endoscopy tomorrow to allow direct luminal evaluation of biopsies to be obtained. Order placed. Procedure risks versus benefits discussed with patient. Order will be placed for the patient to have Lovenox held in the morning. She should remain on her current medications. We will  continue to monitor.  These services provided by Payton Emerald, MS, APRN, [facility], FNP under collaborative agreement with Verdie Shire, MD.    ____________________________ Payton Emerald, NP dsh:ce D: 05/22/2013 16:37:18 ET T: 05/22/2013 19:44:59 ET JOB#: [id number]  cc: Payton Emerald, NP, <Dictator> Payton Emerald MD ELECTRONICALLY SIGNED 05/26/2013 16:11

## 2014-06-27 NOTE — Consult Note (Signed)
Pt seen and examined. Please see Dawn Harrison's notes. Pt with epig pain. SOB but 02 sat 93% on 2 L. GB out. Hx of Barrett's. Due to repeat EGD this year. Though Barrett's should not cause epig pain, will plan EGD tomorrow as long as breathing stable. If EGD neg, consider MRCP to evaluate CBD since both T.bili/alk phos elevated. Thanks.  Electronic Signatures: Verdie Shire (MD)  (Signed on 19-Mar-15 15:52)  Authored  Last Updated: 19-Mar-15 15:52 by Verdie Shire (MD)

## 2014-06-27 NOTE — H&P (Signed)
PATIENT NAME:  Loretta Wade, Loretta Wade MR#:  [id number] DATE OF BIRTH:  [birth year]  DATE OF ADMISSION:  05/19/2013  PRIMARY CARE PHYSICIAN: Keith Rake, MD  EMERGENCY ROOM PHYSICIAN: Dr. Corky Downs   CHIEF COMPLAINT: Trouble breathing.   HISTORY OF PRESENT ILLNESS: A 64 year old female, who is morbidly obese, comes in because of shortness of breath. The patient has been having trouble breathing with cough and chest pain for about 2 weeks. The patient tried inhalers and nebulizers at home with no relief. Brought in by the EMS today because of left arm pain and breast pain, and also tightness in the chest with trouble breathing. Oxygen saturations were 86%, 87% on room air. Right now she is on 3 liters, saturating at 90%, 92%. The patient also complains of abdominal pain radiating to the back. She says she has been having this abdominal pain for about 2 to 3 days. No dysuria. No nausea. No vomiting. No diarrhea. The patient has no constipation.   PAST MEDICAL HISTORY: Significant for GERD, COPD, morbid obesity, sleep apnea, hypertension.   HOME MEDICATIONS:  1.  Advair Diskus 250/50 one puff b.i.d. 2.  Benicar 5 mg p.o. daily,  3.  Dexilant 60 mg p.o. daily.  4.  Etodolac 400 mg p.o. b.i.d.  5.  Neurontin 300 mg p.o. daily.  6.  Lyrica 150 mg p.o. b.i.d.  7.  ProAir 90 mcg inhalation 2 puffs every 6 hours.  8.  Propranolol extended release 80 mg daily.   ALLERGIES: vioxx  SOCIAL HISTORY: Previous smoker, smoked for about 40 to 50 years, now quit, in 2008. No alcohol. No drugs. Lives alone.   PAST SURGICAL HISTORY: Significant for right total knee replacement, gallbladder surgery, and total abdominal hysterectomy,   FAMILY HISTORY: No hypertension or diabetes.   REVIEW OF SYSTEMS:  CONSTITUTIONAL: The patient felt like fever sensation this morning.  EYES: No blurred vision.  ENT: No tinnitus. No epistaxis. No difficulty swallowing.  RESPIRATORY: Has cough, shortness of breath, wheezing,  tightness in the chest for about 1 week.  CARDIOVASCULAR: Tightness in the chest due to trouble breathing. No orthopnea or PND.  GASTROINTESTINAL: No nausea. No vomiting. No diarrhea, nausea. No abdominal pain. The patient does have history of GERD.  GENITOURINARY: No dysuria or hematuria.  ENDOCRINE: No polyuria or nocturia.  INTEGUMENTARY: No skin rashes.  MUSCULOSKELETAL: Complains of joint pains and has a history of fibromyalgia.  NEUROLOGIC: No numbness or weakness.  PSYCHIATRIC: No anxiety or insomnia.   PHYSICAL EXAMINATION:  VITAL SIGNS: Temperature 98 Fahrenheit, heart rate 88, blood pressure 92/62 during my visit. Initial blood pressure was 155/68. The patient's O2 saturations are between 86% to 90% on 3 liters.  GENERAL: Alert, awake, oriented, morbidly obese female having audible wheezing and able to  talk in full sentences.  HEAD: Atraumatic, normocephalic.  EYES: Pupils equal, reacting to light. Extraocular movements are intact.  ENT: No tympanic membrane congestion. No turbinate hypertrophy. No oropharyngeal erythema.  NECK: Supple. No JVD. No carotid bruit.  CARDIOVASCULAR: S1, S2 regular. No murmurs.  LUNGS: Bilateral expiratory wheeze in all lung fields. The patient is not using accessory muscles for respiration.  ABDOMEN: Soft, obese. Bowel sounds present. The patient has no organomegaly. No hernias. Does have mild suprapubic tenderness present.  EXTREMITIES: No extremity edema. No cyanosis. No clubbing.  NEUROLOGIC: Cranial nerves II to XII are intact. Power 5/5 in upper and lower extremities. Sensations are intact. DTRs 2+ bilaterally.   LABORATORY DATA: Electrolytes: Sodium 137, potassium 3.9,  chloride 107, bicarb 26, BUN 16, creatinine 0.72, glucose 109, alkaline phosphatase 162. WBC 7.5 >, hemoglobin 11.9, hematocrit 37, platelets 190. Lipase 69. UA: Yellow colored, 1+  bilirubin, 3+ leukocyte esterase, WBC 660, bacteria 3+.   Chest x-ray showed left base atelectasis  versus infiltrate, no convincing pulmonary edema, central vascular congestion.   EKG shows sinus rhythm with PACs 84 beats per minutes. No ST or T changes.   ASSESSMENT AND PLAN:  1.  The patient is a 64 year old female patient with acute respiratory failure due to chronic obstructive pulmonary disease exacerbation and pneumonia. The patient is going to be admitted to telemetry. Continue her on IV Solu-Medrol along with DuoNebs and IV Rocephin and Zithromax, see how she does. Continue oxygen 2 to 3 liters.  2.  Urinary tract infection. The patient already on Rocephin. Follow urine cultures.  3.  Fibromyalgia. Continue her on her Lyrica and Neurontin.  4.  History of sleep apnea. Continue CPAP at night.  5.  Hypotension. The patient's propranolol and Benicar will be on hold and see how she does. We are going to continue IV fluids, normal saline at 60 mL/hour.  6.  Gastroesophageal reflux disease. Continue PPIs.   TOTAL TIME SPENT ON HISTORY AND PHYSICAL: About 55 minutes.   ____________________________ Epifanio Lesches, MD sk:np D: 05/19/2013 15:07:00 ET T: 05/19/2013 18:40:47 ET JOB#: [id number]  cc: Epifanio Lesches, MD, <Dictator>     Epifanio Lesches MD ELECTRONICALLY SIGNED 05/21/2013 14:10

## 2014-06-27 NOTE — Discharge Summary (Signed)
PATIENT NAME:  Loretta Wade, BERTHOLF MR#:  [id number] DATE OF BIRTH:  [birth year]  DATE OF ADMISSION:  05/19/2013 DATE OF DISCHARGE:  03/20/015  DISPOSITION: Discharged to skilled nursing facility.   DISCHARGE DIAGNOSES: 1. Acute chronic obstructive pulmonary disease exacerbation.  2. Acute respiratory failure.  3. Esophagitis with epigastric pain.  4. Morbid obesity.  5. Sleep apnea, on CPAP.   DISCHARGE MEDICATIONS: 1. Propranolol 80 mg oral once a day extended release.  2. ProAir HFA 2 puffs inhaled every six hours as needed.  3. Advair Diskus 250/50 1 puff inhaled 2 times a day. 4. Gabapentin 3 mg oral once a day.  5. Lyrica 150 mg oral 2 times a day.  6. Benicar 5 mg oral once a day.  7. Dexilant 60 mg oral 2 times a day.  8. Spiriva 18 mcg inhaled once a day.  9. DuoNeb 3 mL inhaled 4 times a day.  10. Levaquin 750 mg oral once a day for five days.  11. Prednisone 10 mg oral 60 mg tapered over 12 days 5 mg tapered daily.  12. Ultracet 37.5/325 mg oral 3 times a day as needed for pain.   ADMITTING HISTORY AND PHYSICAL: Please see detailed H and P dictated previously by Dr. Charma Igo. In brief, a 63 year old morbidly obese, African American female patient with history of past tobacco use, COPD, morbid obesity, sleep apnea, and admitted to the hospital complaining of trouble breathing, but also has epigastric pain. The patient was admitted to the hospitalist service after being found to have acute respiratory failure with COPD exacerbation. There was some concern for pneumonia but seemed like atelectasis on the chest x-ray.   HOSPITAL COURSE: 1. Acute chronic obstructive pulmonary disease exacerbation with acute respiratory failure. The patient was started on Advair, Spiriva and DuoNeb nebulizer along with IV steroids, antibiotics with which she has improved well, still has some mild wheezing on day of discharge on examination, but will need further steroid taper and nebulizer treatment  with antibiotic.  2. Epigastric pain. The patient had an EGD done, which showed esophagitis and will be on PPIs b.i.d. The patient has been on etodolac chronically, which has been stopped and is being replaced with tramadol for pain.  3. Sleep apnea. The patient has been continued on CPAP. I have asked her to bring her CPAP from home to rehab which she can continue while sleeping.  4. Weakness. The patient was seen by physical therapy. I have recommended short-term rehab and the patient is being discharged to short-term rehab.   Prior to discharge, on examination her lungs have mild wheezing but good air entry. No tachypnea. No use of accessory muscles, has mild tenderness in the epigastric area.   DISCHARGE INSTRUCTIONS: DIET: 1800 calorie, low-sodium diet. Activity as tolerated with assistance. Follow-up with the rehab physician within 1 to 2 days. CPAP at night from home. The patient will need to follow up with new appointment with pulmonary for COPD.   Time spent today was 50 minutes.  ____________________________ Leia Alf Elior Robinette, MD srs:sg D: 05/23/2013 15:22:02 ET T: 05/23/2013 15:38:13 ET JOB#: [id number]  cc: Alveta Heimlich R. Keon Pender, MD, <Dictator> Neita Carp MD ELECTRONICALLY SIGNED 05/31/2013 10:27

## 2014-06-29 ENCOUNTER — Other Ambulatory Visit: Payer: Self-pay | Admitting: *Deleted

## 2014-06-29 NOTE — Patient Outreach (Signed)
[facility] [facility]) Care Management  06/29/2014  Loretta Wade December 14, 1950 [id number]   Phone call to patient to reschedule home visit as patient called and cancelled appointment previously scheduled for 06/11/14.  Home visit scheduled for Monday, 07/06/14 at 12:30pm.     [place], [place] [phone]

## 2014-07-01 NOTE — Consult Note (Signed)
PATIENT NAME:  Loretta Wade, Loretta Wade MR#:  [id number] DATE OF BIRTH:  09-19-1950  DATE OF CONSULTATION:  03/04/2014  REFERRING PHYSICIAN:  Katha Hamming, MD  CONSULTING PHYSICIAN:  Daniele Yankowski D. Firman Petrow, MD  INDICATION FOR CONSULTATION: Shortness of breath, chest pain.  HISTORY OF PRESENT ILLNESS: The patient is 64 year old female with history of COPD uses oxygen 2 L at home and comes to the ER because of shortness of breath. The patient stated she has had trouble breathing since Thanksgiving. Her primary doctor gave her amoxicillin; she took for 5 days, had some relief and symptoms  got better but then symptoms restarted again. The day prior to admission she also states that there has been gradual worsening shortness of breath with cough. No phlegm. She has had some edema and mild orthopnea, PND. The patient also has had severe wheezing and oxygen saturations have been about 93 on 4 L. Patient also uses 2 L at home. The patient has had fever at home. Complained of left-sided pleuritic chest pain. Came to the hospital for further evaluation.   PAST MEDICAL HISTORY: Sleep apnea, states that her sleep machine is not working for a year; COPD; GERD; hypertension.   SOCIAL HISTORY: Smoked but quit 20 years ago. No alcohol consumption. Lives alone. Had one daughter who died of breast cancer.   ALLERGIES: VIOXX.  HOME MEDICATIONS: Include Advair Diskus 250/50 twice a day, Benicar 20 mg daily, Breo Ellipta 100/25 twice a day, Dexilant 60 mg a day, DuoNebs 4 times a day, etodolac 400 twice a day, Lasix 20 mg daily, Neurontin 300 daily, Lyrica 150 twice a day, Myrbetriq 25 mg daily, oxymetazoline nasal spray twice a day, Protonix 40 a day, propranolol XR 80 mg a day, Requip 0.25 t.i.d., Spiriva mcg daily, Ventolin   mcg every 6 hours.   FAMILY HISTORY: Hypertension, diabetes.   PAST SURGICAL HISTORY: Hysterectomy, gallbladder, right total knee.   REVIEW OF SYSTEMS: Denies blackout spells, syncope.  Denies nausea or vomiting. She has had some fever, chills, some occasional sweats, shortness of breath. No sputum production. No weight loss. No weight gain. No hemoptysis or hematemesis. No bright red blood per rectum vision change or hearing change. Denies sputum production or cough.    PHYSICAL EXAMINATION:  VITAL SIGNS: Blood pressure 120/70, pulse 70, respiratory rate of 16, afebrile.  HEENT: Normocephalic and supple. Pupils equal and reactive to light.  NECK: Supple. No significant   LUNGS: Clear to auscultation .  No wheezing, rhonchi or rales.  EXTREMITIES: Mild edema.   LABORATORY DATA: White count of 4, hemoglobin 11, hematocrit 36, platelet count 210,000. Electrolytes: Sodium 134, potassium 4.1, chloride 108, bicarbonate of 32, BUN 9, creatinine 0.8, glucose of 112. Troponin less than 0.02, BNP 128.   CHEST X-RAY: Mild pulmonary vascular congestion, no focal consolidation.   EKG: Normal sinus rhythm, nonspecific ST-T changes.   ASSESSMENT: Chronic obstructive pulmonary disease, asthma, obstructive sleep apnea, shortness of breath, gastroesophageal reflux disease, overactive bladder, hypertension, restless leg syndrome, chest pain.  PLAN: Agree with admit. Rule out myocardial infarction. Continue respiratory therapy. Continue inhalers. Continue antibiotics. Continue DVT prophylaxis. Chest pain appears to be noncardiac, atypical. We will consider functional study, possibly a Myoview. Do not recommend cardiac catheterization. Recommend weight loss, portion control for obesity. Recommend compliance with her CPAP. Continue reflux therapy as necessary. We will continue hypertension control, propranolol  restless leg syndrome with Requip continue general medical therapy for now  redirect our intervention possibly for a functional study to evaluate  atypical chest pain.    ____________________________ Bobbie Stack. Juliann Pares, MD ddc:bm D: 03/05/2014 00:35:00 ET T: 03/05/2014 02:06:01  ET JOB#: [id number]  cc: Rece Zechman D. Juliann Pares, MD, <Dictator> Alwyn Pea MD ELECTRONICALLY SIGNED 03/25/2014 14:06

## 2014-07-01 NOTE — H&P (Signed)
PATIENT NAME:  Loretta Wade, Loretta Wade MR#:  [id number] DATE OF BIRTH:  Feb 20, 1951  DATE OF ADMISSION:  03/02/2014  PRIMARY CARE PHYSICIAN:  Dr. Sabino Niemann.  EMERGENCY ROOM PHYSICIAN:  Dr. Carrie Mew.   CHIEF COMPLAINT:  Shortness of breath.  HISTORY OF PRESENT ILLNESS: The patient is a 64 year old female with a history of COPD, uses oxygen 2 liters all the time; comes in because of shortness of breath. The patient started to have trouble breathing since Thanksgiving, her primary doctor gave her amoxicillin. She took it for 5 days.  Her symptoms got better, but the symptoms restarted again since last night. Also says that there is gradual worsening of shortness of breath, cough with no phlegm and pedal edema, some orthopnea and PND. The patient also has severe wheezing and her oxygen saturation 93% on 4 liters.  The patient uses only 2 liters at home. The patient fever at home and also today complained of left-sided pleuritic chest pain.   PAST MEDICAL HISTORY: Significant for sleep apnea and she says her CPAP machine is not working for the past one year. Other history includes chronic obstructive pulmonary disease, and GERD.  Also includes hypertension.   SOCIAL HISTORY: Previous smoker; quit 20 years ago. No alcohol. No drugs.  Lives alone. The patient had one daughter. She died of breast cancer.   ALLERGIES: VIOXX.   HOME MEDICATIONS: Include:  1.  Advair Diskus 250/51 puff b.i.d. 2.  Benicar 20 mg p.o. daily.  3.  Breo Ellipta 100/25 mg 1 inhalation b.i.d.  4.  Dexilant 60 mg p.o. daily. 5.  DuoNebs inhaled 4 times daily. 6.  Etodolac 400 mg p.o. b.i.d.  7.  Furosemide 20 mg p.o. daily. 8.  Neurontin 300 mg p.o. daily. 9.  Lyrica 150 mg p.o. b.i.d.  10.  Myrbetriq 25 mg p.o. daily.   11.  Oxymetazoline 0.05% nostril spray 1 spray b.i.d.  12.  Pantoprazole 40 mg p.o. daily.  13.  Propranolol XR 80 mg p.o. daily.  14.  Requip 0.25 mg p.o. t.i.d.  15.  Spiriva 18 mcg p.o. daily.   16.  Ventolin 90 mcg inhalation every six hours as needed.   FAMILY HISTORY: The patient has an aunt, mother, and sister; all of them have high blood pressure and diabetes.   PAST SURGICAL HISTORY: Also includes hysterectomy, gallbladder surgery and right total knee replacement.   REVIEW OF SYSTEMS:  CONSTITUTIONAL: The patient feels short of breath and cough present. Pedal edema present.  EYES: No blurred vision.  ENT: No tinnitus. No epistaxis. No difficulty swallowing.  RESPIRATIONS: Does have some cough and shortness of breath, getting worse since last night.  CARDIOVASCULAR: Had left-sided pleuritic chest pain today, orthopnea present, PND present. No palpitations or syncope.  GASTROINTESTINAL: No nausea. No vomiting. No abdominal pain.   GENITOURINARY: No dysuria.  ENDOCRINE: No polyuria or polydipsia.  HEMATOLOGIC: No anemia.  INTEGUMENTARY: No skin rash.  MUSCULOSKELETAL: No joint pain.  NEUROLOGIC: No numbness or weakness.  PSYCHIATRIC: Anxiety and insomnia.   PHYSICAL EXAMINATION:  VITAL SIGNS: Temperature 98.5, heart rate 68, blood pressure 119/73, saturations 95% on 4 liters.  GENERAL EXAMINATION: A well-developed, well-nourished obese female not in distress.  HEAD: Normocephalic, atraumatic.  EYES: Pupils equal, reacting to light. No conjunctival pallor. No scleral icterus.   NOSE: No nasal lesions. No drainage.  EARS: No drainage or external lesions.   MOUTH: No lesions. No exudates.  NECK: Supple. No JVD. No carotid bruit. Normal range of motion.  RESPIRATIONS:  Bilateral expiratory wheeze in all lung fields present.  CARDIOVASCULAR: S1, S2 regular. The patient has no murmurs, does have 2+ pitting edema bilaterally. Pulses are equal in carotid, femoral, and dorsalis pedis.  GASTROINTESTINAL: Abdomen is obese, bowel sounds present, nontender, nondistended. No organomegaly. No hernias.   GENITOURINARY:  Deferred. MUSCULOSKELETAL: Normal gait and station. The patient  extremities move x4. No tenderness or effusion. Normal range of motion. Strength and tone are equal bilaterally.  SKIN: Inspection is within normal limits, well-hydrated.  LYMPHATICS: No lymphadenopathy.  NEUROLOGIC: Cranial II to XII are intact. Power 5/5 in the upper and lower extremities. Sensations are intact. DTRs 2+ bilaterally.  PSYCHIATRIC: Mood and affect are within normal limits.   LABORATORY DATA: WBC 4.3, hemoglobin 11.1, hematocrit 35.7, platelets 210. Electrolytes: Sodium 144, potassium 4.1, chloride 108, bicarbonate 32, BUN 9, creatinine 0.8 and glucose 112. The patient's troponin is less than 0.02. BNP 128. Chest x-ray shows suspected mild pulmonary vascular congestion. No focal consolidation. The patient's EKG did not show any acute ST-T changes.   ASSESSMENT AND PLAN:   1.   The patient is a 64 year old female patient with chronic obstructive pulmonary disease exacerbation. Admit her to hospitalist service under observation and started her on DuoNebs,  Solu-Medrol and Levaquin and continue her home Spiriva and see how she does.  2.  History of gastroesophageal reflux disease. Continue proton pump inhibitors.  3.  History of sleep apnea and CPAP at night.  4.  Chronic respiratory failure. Continue oxygen 2 liters.  5.  Overactive bladder. Continue Myrbetriq 25 mg daily.  6.  Hypertension. Continue propranolol 40 mg extended release daily.  7.  Restless leg syndrome. Continue Requip 0.25 mg p.o. t.i.d.  TIME SPENT:  55 minutes.    ____________________________ Epifanio Lesches, MD sk:at D: 03/02/2014 14:49:57 ET T: 03/02/2014 15:20:26 ET JOB#: [id number]  cc: Epifanio Lesches, MD, <Dictator> Epifanio Lesches MD ELECTRONICALLY SIGNED 04/07/2014 8:50

## 2014-07-02 ENCOUNTER — Other Ambulatory Visit: Payer: Self-pay | Admitting: *Deleted

## 2014-07-02 NOTE — Patient Outreach (Signed)
Follow up phone call as pt called 4/15 to cancel home visit, was  Suppose to call back and reschedule.    Spoke with pt today to talk about scheduling home and with [name] SW doing a home visit 5/2, it was agreed  To have RN CM do a joint visit at that time.     Plan to inform [name] SW that RN CM will be doing a joint home visit on 5/2.      Zara Chess.   [place] Care Management  [phone]

## 2014-07-06 ENCOUNTER — Other Ambulatory Visit: Payer: Self-pay | Admitting: *Deleted

## 2014-07-06 VITALS — BP 94/62 | HR 67 | Resp 24 | Ht 64.0 in | Wt 364.0 lb

## 2014-07-06 DIAGNOSIS — I509 Heart failure, unspecified: Secondary | ICD-10-CM

## 2014-07-06 NOTE — Patient Outreach (Addendum)
  [place] [facility]) Care Management  [facility] Social Work  07/06/2014  ISOLA MEHLMAN 06-19-1950 [id number]  Current Medications:  Current Outpatient Prescriptions  Medication Sig Dispense Refill  . cholecalciferol (VITAMIN D) 1000 UNITS tablet     . dexlansoprazole (DEXILANT) 60 MG capsule Take 60 mg by mouth daily.    . DULoxetine (CYMBALTA) 60 MG capsule Take 60 mg by mouth 2 (two) times daily.    Marland Kitchen etodolac (LODINE) 400 MG tablet Take 400 mg by mouth 2 (two) times daily.    . fish oil-omega-3 fatty acids 1000 MG capsule     . Fluticasone Furoate-Vilanterol 100-25 MCG/INH AEPB Inhale into the lungs daily.    . Fluticasone-Salmeterol (ADVAIR) 250-50 MCG/DOSE AEPB Inhale 1 puff into the lungs 2 (two) times daily.    . furosemide (LASIX) 20 MG tablet Take 20 mg by mouth.    . hydrochlorothiazide (HYDRODIURIL) 25 MG tablet Take 25 mg by mouth daily.    Marland Kitchen ipratropium-albuterol (DUONEB) 0.5-2.5 (3) MG/3ML SOLN Take 3 mLs by nebulization as needed. Every 6 hours as needed.  Pt taking twice a day    . Magnesium 250 MG TABS Take by mouth.    . magnesium gluconate (MAGONATE) 500 MG tablet Take 500 mg by mouth daily.    Marland Kitchen olmesartan (BENICAR) 20 MG tablet Take 20 mg by mouth daily.    . pantoprazole (PROTONIX) 40 MG tablet Take 40 mg by mouth daily.    . pregabalin (LYRICA) 150 MG capsule Take 150 mg by mouth 2 (two) times daily.    . propranolol (INDERAL) 80 MG tablet     . rOPINIRole (REQUIP) 0.25 MG tablet Take 0.25 mg by mouth 3 (three) times daily.    . tizanidine (ZANAFLEX) 2 MG capsule     . vitamin E 400 UNIT capsule Take 400 Units by mouth daily.     No current facility-administered medications for this visit.    Functional Status:  In your present state of health, do you have any difficulty performing the following activities: 07/06/2014  Hearing? N  Vision? N  Difficulty concentrating or making decisions? N  Walking or climbing stairs? Y  Dressing or bathing? N   Doing errands, shopping? Y  Preparing Food and eating ? Y  Using the Toilet? Y  In the past six months, have you accidently leaked urine? Y  Do you have problems with loss of bowel control? N  Managing your Medications? N  Managing your Finances? N  Housekeeping or managing your Housekeeping? Y    Fall/Depression Screening:  PHQ 2/9 Scores 07/06/2014  PHQ - 2 Score 6  PHQ- 9 Score 17   Assessment:  Co-Home visit with RNCM to see patient.  Patient discussed increased depression related to the loss of her daughter and her medical condition.  Per patient, it is difficulty for her to do things due to pain symptoms or difficulty breathing.  Patient interested in mental health resources and possibly re-starting an anti-depressant medication. [place] program discussed as a suggestion of RNCM to increase socialization.   Plan: Patient agrees to referral to [facility] behavioral health program.  This social worker to contact patient's doctor to discuss re-start of anti-depressant.  Referral to be made to [facility] as well-[phone].  Case closure cancelled due to increase in depressive symptoms and need for community resources.    Sheralyn Boatman [facility] Care Management [phone]
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
## 2014-07-07 ENCOUNTER — Other Ambulatory Visit: Payer: Self-pay | Admitting: *Deleted
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
## 2014-07-07 NOTE — Patient Outreach (Signed)
[place] [facility]) Care Management  07/07/2014  Silva Aamodt [place] 03/15/1950 [id number]    Referral made to [facility].  Spoke with Kathreen Cornfield who will contact patient in regards to eligibility requirements, program details and interest in program.    [place], [place] Management [phone]

## 2014-07-08 ENCOUNTER — Encounter: Payer: Self-pay | Admitting: *Deleted

## 2014-07-08 ENCOUNTER — Emergency Department: Payer: Medicare PPO

## 2014-07-08 ENCOUNTER — Other Ambulatory Visit: Payer: Self-pay

## 2014-07-08 ENCOUNTER — Other Ambulatory Visit: Payer: Self-pay | Admitting: *Deleted

## 2014-07-08 ENCOUNTER — Emergency Department
Admission: EM | Admit: 2014-07-08 | Discharge: 2014-07-08 | Disposition: A | Discharge status: Home / Self Care | Payer: Medicare PPO | Attending: Internal Medicine | Admitting: Internal Medicine

## 2014-07-08 DIAGNOSIS — J441 Chronic obstructive pulmonary disease with (acute) exacerbation: Secondary | ICD-10-CM | POA: Insufficient documentation

## 2014-07-08 DIAGNOSIS — J40 Bronchitis, not specified as acute or chronic: Secondary | ICD-10-CM

## 2014-07-08 DIAGNOSIS — Z7951 Long term (current) use of inhaled steroids: Secondary | ICD-10-CM | POA: Diagnosis not present

## 2014-07-08 DIAGNOSIS — R600 Localized edema: Secondary | ICD-10-CM | POA: Insufficient documentation

## 2014-07-08 DIAGNOSIS — L03115 Cellulitis of right lower limb: Secondary | ICD-10-CM

## 2014-07-08 DIAGNOSIS — R0602 Shortness of breath: Secondary | ICD-10-CM

## 2014-07-08 DIAGNOSIS — I10 Essential (primary) hypertension: Secondary | ICD-10-CM | POA: Insufficient documentation

## 2014-07-08 DIAGNOSIS — R609 Edema, unspecified: Secondary | ICD-10-CM

## 2014-07-08 DIAGNOSIS — Z87891 Personal history of nicotine dependence: Secondary | ICD-10-CM | POA: Diagnosis not present

## 2014-07-08 DIAGNOSIS — G8929 Other chronic pain: Secondary | ICD-10-CM | POA: Diagnosis not present

## 2014-07-08 DIAGNOSIS — L03116 Cellulitis of left lower limb: Secondary | ICD-10-CM | POA: Diagnosis not present

## 2014-07-08 DIAGNOSIS — R079 Chest pain, unspecified: Secondary | ICD-10-CM | POA: Diagnosis present

## 2014-07-08 DIAGNOSIS — Z79899 Other long term (current) drug therapy: Secondary | ICD-10-CM | POA: Diagnosis not present

## 2014-07-08 HISTORY — DX: Chronic obstructive pulmonary disease, unspecified: J44.9

## 2014-07-08 LAB — CBC WITH DIFFERENTIAL/PLATELET
BASOS ABS: 0.1 10*3/uL (ref 0–0.1)
BASOS PCT: 2 %
EOS PCT: 4 %
Eosinophils Absolute: 0.1 10*3/uL (ref 0–0.7)
HCT: 37.8 % (ref 35.0–47.0)
Hemoglobin: 11.8 g/dL — ABNORMAL LOW (ref 12.0–16.0)
Lymphocytes Relative: 37 %
Lymphs Abs: 1.5 10*3/uL (ref 1.0–3.6)
MCH: 29.7 pg (ref 26.0–34.0)
MCHC: 31.2 g/dL — ABNORMAL LOW (ref 32.0–36.0)
MCV: 95.3 fL (ref 80.0–100.0)
Monocytes Absolute: 0.6 10*3/uL (ref 0.2–0.9)
Monocytes Relative: 15 %
NEUTROS PCT: 42 %
Neutro Abs: 1.7 10*3/uL (ref 1.4–6.5)
Platelets: 199 10*3/uL (ref 150–440)
RBC: 3.97 MIL/uL (ref 3.80–5.20)
RDW: 14.6 % — AB (ref 11.5–14.5)
WBC: 4 10*3/uL (ref 3.6–11.0)

## 2014-07-08 LAB — COMPREHENSIVE METABOLIC PANEL
ALT: 23 U/L (ref 14–54)
AST: 22 U/L (ref 15–41)
Albumin: 3.6 g/dL (ref 3.5–5.0)
Alkaline Phosphatase: 92 U/L (ref 38–126)
Anion gap: 5 (ref 5–15)
BUN: 26 mg/dL — ABNORMAL HIGH (ref 6–20)
CALCIUM: 9.3 mg/dL (ref 8.9–10.3)
CO2: 30 mmol/L (ref 22–32)
Chloride: 105 mmol/L (ref 101–111)
Creatinine, Ser: 1.05 mg/dL — ABNORMAL HIGH (ref 0.44–1.00)
GFR calc non Af Amer: 56 mL/min — ABNORMAL LOW (ref >60)
Glucose, Bld: 156 mg/dL — ABNORMAL HIGH (ref 65–99)
POTASSIUM: 5.2 mmol/L — AB (ref 3.5–5.1)
SODIUM: 140 mmol/L (ref 135–145)
Total Bilirubin: 0.5 mg/dL (ref 0.3–1.2)
Total Protein: 7 g/dL (ref 6.5–8.1)

## 2014-07-08 LAB — GLUCOSE, CAPILLARY: Glucose-Capillary: 125 mg/dL — ABNORMAL HIGH (ref 70–99)

## 2014-07-08 LAB — BRAIN NATRIURETIC PEPTIDE: B NATRIURETIC PEPTIDE 5: 18 pg/mL (ref 0.0–100.0)

## 2014-07-08 LAB — TROPONIN I: Troponin I: <0.03 ng/mL (ref <0.031)

## 2014-07-08 MED ORDER — FUROSEMIDE 10 MG/ML IJ SOLN
80.0000 mg | Freq: Once | INTRAMUSCULAR | Status: AC
  Administered 2014-07-08: 80 mg via INTRAVENOUS

## 2014-07-08 MED ORDER — METHYLPREDNISOLONE SODIUM SUCC 125 MG IJ SOLR
INTRAMUSCULAR | Status: AC
  Administered 2014-07-08: 125 mg via INTRAVENOUS
  Filled 2014-07-08: qty 2

## 2014-07-08 MED ORDER — CEPHALEXIN 500 MG PO CAPS: 500.0000 mg | ORAL_CAPSULE | Freq: Four times a day (QID) | ORAL | Status: DC

## 2014-07-08 MED ORDER — METHYLPREDNISOLONE SODIUM SUCC 125 MG IJ SOLR
125.0000 mg | Freq: Once | INTRAMUSCULAR | Status: AC
  Administered 2014-07-08: 125 mg via INTRAVENOUS

## 2014-07-08 MED ORDER — IPRATROPIUM-ALBUTEROL 0.5-2.5 (3) MG/3ML IN SOLN
RESPIRATORY_TRACT | Status: AC
  Administered 2014-07-08: 3 mL via RESPIRATORY_TRACT
  Filled 2014-07-08: qty 3

## 2014-07-08 MED ORDER — IPRATROPIUM-ALBUTEROL 0.5-2.5 (3) MG/3ML IN SOLN
3.0000 mL | RESPIRATORY_TRACT | Status: DC
  Administered 2014-07-08: 3 mL via RESPIRATORY_TRACT

## 2014-07-08 MED ORDER — FUROSEMIDE 10 MG/ML IJ SOLN
INTRAMUSCULAR | Status: AC
  Filled 2014-07-08: qty 8

## 2014-07-08 NOTE — Discharge Instructions (Signed)
Increase lasix to 2 tabs daily for 3 days. Keflex as directed. Wear teds supportive hose. Use your o2 as directed. Aspirin 1 tab 81 mg daily.continue your other med's. See your dr in  follow as directed; If your symptoms worsen or if you develop new symptoms return to the er.

## 2014-07-08 NOTE — ED Notes (Signed)
Pt reports chest pain that started 5 days ago associated with shortness of breath. Pt has hx of COPD on 2L [place] at all times.  Pt also complains of burning in panis of belly and staying how there.  Also reports bilateral extremity pain and burning.  Thought she might have blood clots.  Pt typically walks with walker.  NAD alert and oriented.  NSR> CBG 134 per ems

## 2014-07-08 NOTE — ED Provider Notes (Signed)
[facility] Emergency Department Provider Note    ____________________________________________  Time seen: 15:10 PM  I have reviewed the triage vital signs and the nursing notes.   HISTORY  Chief Complaint Chest Pain   The patient arrived to the emergency department by EMS.    HPI Loretta Wade is a 64 y.o. female who presents to the emergency department with a five-day history of chest pain and tightness in chest.   She describes the pain as in the mid center of her chest constant for the past 5 days without change in severity doesn't related to any kind of activity is just been constant it is mild to moderate in severity 3 to 5/10 she describes it as a tightness it is non radiating. she's had no recent changes in her medication . deep breath does not make it worse/ motion does not affect the pain.   associated symptoms include a cough but no sputum, she does have shortness of breath, this is worse on exertion , she has chills but no fever. She also complains of increased bilateral lower extremity swelling and cramps/tightness in both her legs.  She has an extensive medical history which is positive for home O2which she takes for COPD, CHF hypertension, sleep apnea, anemia chronic pain/fibromyalgia osteoarthritis peripheral edema esophageal reflux.  No past medical history of coronary artery disease or MI.  She's been compliant with her medication. And has had no recent medication changes.     Past Medical History  Diagnosis Date  . OA (osteoarthritis)   . Fibromyalgia   . Anemia   . Sleep apnea   . Fatigue   . Edema   . Esophageal reflux   . Nocturia   . Hypertension   . COPD (chronic obstructive pulmonary disease)     There are no active problems to display for this patient.   Past Surgical History  Procedure Laterality Date  . Cholecystectomy    . Total knee arthroplasty      right  . Vesicovaginal fistula closure w/ tah    .  Abdominal hysterectomy      Current Outpatient Rx  Name  Route  Sig  Dispense  Refill  . cephALEXin (KEFLEX) 500 MG capsule   Oral   Take 1 capsule (500 mg total) by mouth 4 (four) times daily.   28 capsule   0   . cholecalciferol (VITAMIN D) 1000 UNITS tablet               . dexlansoprazole (DEXILANT) 60 MG capsule   Oral   Take 60 mg by mouth daily.         . DULoxetine (CYMBALTA) 60 MG capsule   Oral   Take 60 mg by mouth 2 (two) times daily.         Marland Kitchen etodolac (LODINE) 400 MG tablet   Oral   Take 400 mg by mouth 2 (two) times daily.         . fish oil-omega-3 fatty acids 1000 MG capsule               . Fluticasone Furoate-Vilanterol 100-25 MCG/INH AEPB   Inhalation   Inhale into the lungs daily.         . Fluticasone-Salmeterol (ADVAIR) 250-50 MCG/DOSE AEPB   Inhalation   Inhale 1 puff into the lungs 2 (two) times daily.         . furosemide (LASIX) 20 MG tablet   Oral   Take  20 mg by mouth.         . hydrochlorothiazide (HYDRODIURIL) 25 MG tablet   Oral   Take 25 mg by mouth daily.         Marland Kitchen ipratropium-albuterol (DUONEB) 0.5-2.5 (3) MG/3ML SOLN   Nebulization   Take 3 mLs by nebulization as needed. Every 6 hours as needed.  Pt taking twice a day         . Magnesium 250 MG TABS   Oral   Take by mouth.         . magnesium gluconate (MAGONATE) 500 MG tablet   Oral   Take 500 mg by mouth daily.         Marland Kitchen olmesartan (BENICAR) 20 MG tablet   Oral   Take 20 mg by mouth daily.         . pantoprazole (PROTONIX) 40 MG tablet   Oral   Take 40 mg by mouth daily.         . pregabalin (LYRICA) 150 MG capsule   Oral   Take 150 mg by mouth 2 (two) times daily.         . propranolol (INDERAL) 80 MG tablet               . rOPINIRole (REQUIP) 0.25 MG tablet   Oral   Take 0.25 mg by mouth 3 (three) times daily.         . tizanidine (ZANAFLEX) 2 MG capsule               . vitamin E 400 UNIT capsule   Oral   Take  400 Units by mouth daily.           Allergies Review of patient's allergies indicates no known allergies.  Family History  Problem Relation Age of Onset  . Hypertension Mother   . Diabetes    . Breast cancer Daughter     Social History History  Substance Use Topics  . Smoking status: Former Research scientist (life sciences)  . Smokeless tobacco: Not on file  . Alcohol Use: No    Review of Systems  Constitutional: Negative for fever. Eyes: Negative for visual changes. ENT: Negative for sore throat. Cardiovascular: Negative for chest pain. Respiratory: Positive for shortness of breath. Positive for cough no sputum Gastrointestinal: Negative for abdominal pain, vomiting and diarrhea. Genitourinary: Negative for dysuria. Musculoskeletal: Negative for back pain. Skin: Negative for rash. Neurological: Negative for headaches, focal weakness or numbness. Next to it that Extremities: Positive for swelling  10-point ROS otherwise negative.  ____________________________________________   PHYSICAL EXAM:  VITAL SIGNS: ED Triage Vitals  Enc Vitals Group     BP 07/08/14 1456 129/64 mmHg     Pulse Rate 07/08/14 1456 72     Resp 07/08/14 1456 19     Temp 07/08/14 1456 98 F (36.7 C)     Temp Source 07/08/14 1456 Oral     SpO2 07/08/14 1456 97 %     Weight 07/08/14 1456 360 lb (163.295 kg)     Height 07/08/14 1456 5\' 4"  (1.626 m)     Head Cir --      Peak Flow --      Pain Score 07/08/14 1456 8     Pain Loc --      Pain Edu? --      Excl. in [place]? --   Initial vital signs on arrival to the emergency Department R above and are stable.  Morbidly obese Constitutional: Alert  and oriented. Well appearing and in no distress. Eyes: Conjunctivae are normal. PERRL. Normal extraocular movements. ENT   Head: Normocephalic and atraumatic.   Nose: No congestion/rhinnorhea.   Mouth/Throat: Mucous membranes are moist.   Neck: No stridor. Hematological/Lymphatic/Immunilogical: No cervical  lymphadenopathy. Cardiovascular: Normal rate, regular rhythm. Normal and symmetric distal pulses are present in all extremities. No murmurs, rubs, or gallops. Respiratory: Normal respiratory effort without tachypnea nor retractions. Breath sounds scattered wheezes and bibasilar rales scattered rhonchi Gastrointestinal: Soft and nontender. No distention. No abdominal bruits. There is no CVA tenderness. Genitourinary: Deferred Musculoskeletal: Bilateral diffuse tenderness with normal range of motion in all extremities. No joint effusions.  No lower extremity tenderness bilateral 3+ pitting edema. Neurologic:  Normal speech and language. No gross focal neurologic deficits are appreciated. Speech is normal. No gait instability. Skin:  Skin is warm, dry and intact. No rash noted. She does have bilateral erythema of the lower extremities which also pitting edema. Psychiatric: Mood and affect are normal. Speech and behavior are normal. Patient exhibits appropriate insight and judgment.  ____________________________________________    LABS (pertinent positives/negatives)  Labs Reviewed  CBC WITH DIFFERENTIAL/PLATELET - Abnormal; Notable for the following:    Hemoglobin 11.8 (*)    MCHC 31.2 (*)    RDW 14.6 (*)    All other components within normal limits  COMPREHENSIVE METABOLIC PANEL - Abnormal; Notable for the following:    Potassium 5.2 (*)    Glucose, Bld 156 (*)    BUN 26 (*)    Creatinine, Ser 1.05 (*)    GFR calc non Af Amer 56 (*)    All other components within normal limits  GLUCOSE, CAPILLARY - Abnormal; Notable for the following:    Glucose-Capillary 125 (*)    All other components within normal limits  TROPONIN I  BRAIN NATRIURETIC PEPTIDE    abnormal results noted on the lab tests in the emergency department included a slightly diminished lowered hemoglobin 11.8 and hematocrit 31.2 her potassium is slightly elevated at 5.2 glucose is elevated at 150 6P Alaniz elevated at 26  with a creatinine of 1.05 and a GFR 56.  The troponin and BNP remain pending at this time ----------------------------------------- 5:11 PM on 07/08/2014 ----------------------------------------- troponin is normal at less than 0.03.____________________________________________   EKG  ED ECG REPORT   Date: 07/08/2014  EKG Time: 1452  Rate: 76  Rhythm: Normal sinus rhythm  Axis: Normal  Intervals:none  ST&T Change: Nonspecific ST-T changes ____________________________________________    RADIOLOGY  a chest x-ray was done in the emergency department and shows heart size which is within normal limits left basilar atelectasis no acute disease  ____________________________________________   PROCEDURES  Procedure(s) performed: None   No    ____________________________________________    INITIAL IMPRESSION / ASSESSMENT AND PLAN / ED COURSE  Pertinent labs & imaging results that were available during my care of the patient were reviewed by me and considered in my medical decision making (see chart for details  In the emergency department she received Solu-Medrol 125 mg IV and a DuoNeb. Her breathing felt better after that treatment. She also received 80 mg Lasix IV for 3+ peripheral edema. Her laboratory evaluation does not indicate a need for hospitalization. Her chest x-ray is normal. Her EKG does not show any abnormalities that are consistent with a need for hospitalization.  Plan is to wait for the results of her troponin. If this is normal. She will be dismissed from the emergency department with a prescription  for antibiotic to treat her bronchitis and her lower extremity cellulitis.  Her Lasix will be increased to 3 days to treat her peripheral edema.).  ____________________________________________   FINAL CLINICAL IMPRESSION(S) / ED DIAGNOSES  Final diagnoses:  SOB (shortness of breath)  Bronchitis  Cellulitis of both lower extremities  Peripheral edema   1.shortness of breath 2. acute bronchitis 3.peripheral edema 4. Lower extremity cellulitis   Boris Lown, DO 07/08/14 1902

## 2014-07-09 ENCOUNTER — Other Ambulatory Visit: Payer: Self-pay | Admitting: *Deleted

## 2014-07-09 NOTE — Patient Outreach (Signed)
[place] [facility]) Care Management  07/09/2014  Loretta Wade [birth year] [id number]   Phone call to patient to discuss recent discharge from the emergency room and to emphaized need for patient to be seen by her primary care doctor.  HIPPA compliant voicemail message left for a return call.  Sheralyn Boatman [facility] Care Management [phone]

## 2014-07-09 NOTE — Patient Outreach (Signed)
Telephone call:  Attempt made to contact pt, f/u on recent ED visit on 07/08/14.   HIPPA compliant voice message left with contact number.   If no response, will try again tomorrow.   Zara Chess.   [place] Care Management  [phone]

## 2014-07-09 NOTE — Patient Outreach (Signed)
[facility]) Care Management  07/09/2014  Loretta Wade 1951/02/07 [id number]   Phone call to patient's provider's office to request re-start of patient antidepressant and need for a referral for a shower chair.  Spoke with providers nurse, Caryl Pina who stated that per patient's doctor, he will not call any more prescriptions in for patient or do anything by phone until she comes in for an appointment.  Per provider's nurse, patient  has missed several in office appointments.  RNCM to be notified, patient to be contacted to explore options to increase compliance with  doctor visits.   Sheralyn Boatman [facility] Care Management [phone]

## 2014-07-10 ENCOUNTER — Other Ambulatory Visit: Payer: Medicare PPO | Admitting: *Deleted

## 2014-07-10 NOTE — Patient Outreach (Signed)
Follow up phone call:  Second attempt made to contact pt, check on status, f/u on recent ED visit.   HIPPA compliant voice message left with contact number.  If no response today,plan to call pt again next week.     Zara Chess.   [place] Care Management  [phone]

## 2014-07-16 ENCOUNTER — Encounter: Payer: Self-pay | Admitting: *Deleted

## 2014-07-16 NOTE — Patient Outreach (Signed)
[facility]) Care Management  07/16/2014  Sueellen Kayes Mazer [birth year] [id number]   Documentation: viewed to see if RN CM added to patient care team.   Already added.   Zara Chess.   [place] Care Management  [phone]

## 2014-07-23 ENCOUNTER — Other Ambulatory Visit: Payer: Self-pay | Admitting: *Deleted

## 2014-07-23 NOTE — Patient Outreach (Signed)
[facility]) Care Management  07/23/2014  Loretta Wade [birth year] [id number]   Phone call to patient to follow up on PACE referral.  Phone rang,however no message was able to left.    Sheralyn Boatman [facility] Care Management [phone]

## 2014-07-24 ENCOUNTER — Other Ambulatory Visit: Payer: Self-pay | Admitting: *Deleted

## 2014-07-24 NOTE — Patient Outreach (Signed)
[facility]) Care Management  07/24/2014  Loretta Wade 1950/07/19 [id number]     Phone call to patient to follow up on referral for all inclusive care (PACE) .  No messages could not be left on patient's phone.  Sheralyn Boatman [facility] Care Management [phone]

## 2014-07-28 ENCOUNTER — Other Ambulatory Visit: Payer: Self-pay | Admitting: *Deleted

## 2014-07-28 NOTE — Patient Outreach (Signed)
Received a call from pt, provided RN CM with result of MD call today (report on cellulitis, need to be seen sooner than 5/26). Pt states she called MD office, informed them about cellulitis in both legs and was told MD is booked solid, to f/u 5/26 as scheduled.    RN CM inquired again about status of legs to which pt states legs hurt on and off, pain/redness/burning comes and goes, does elevate legs.   RN CM reinforced need to elevate legs, complete antibiotic,to f/u at ED if legs get worse, keep appointment with MD on 5/26.  RN CM to see pt again 6/2- scheduled home visit.   Loretta Wade.   [place] Care Management  [phone]

## 2014-07-28 NOTE — Patient Outreach (Signed)
Follow up phone call:  RN CM received a voice message from pt yesterday, pt  provided new phone number [phone].   RN CM called today to inquire about ED visit 5/4 as f/u calls were made, voice messages left with no response.  Pt states they gave me an antibiotic for cellulitis in legs.  Pt she has one more day of the antibiotic (Cephalexin 500 mg - to take four times a day).  Pt states legs are still red, burning, warm, swelling comes and goes.  Pt states she has not seen any improvement.  RN CM discussed with pt need to f/u with [name] and she states has an appointment with MD 5/26.  Pt reports MD does not know about cellulitis, reason for MD visit is she needs to see him in order to have her medications refilled.    RN CM discussed with pt need to f/u with MD sooner to assess bilateral legs.  As discussed, pt to call MD office, provide report on current status of both legs, RN CM's recommendation of needing to be seen by MD as soon as possible.   Pt to call RN CM back, let her know outcome of call.    Zara Chess.   [place] Care Management  [phone]

## 2014-07-29 ENCOUNTER — Other Ambulatory Visit: Payer: Self-pay | Admitting: *Deleted

## 2014-07-29 NOTE — Patient Outreach (Addendum)
[facility]) Care Management  07/29/2014  Loretta Wade [birth year] [id number]   Follow up phone call to patient regarding all inclusive care.  Patient could not be reached.  This social worker will request co-visit on 08/06/14 with [facility] for follow up purposes.  Sheralyn Boatman [facility] Care Management [phone]

## 2014-08-06 ENCOUNTER — Other Ambulatory Visit: Payer: Self-pay | Admitting: *Deleted

## 2014-08-06 ENCOUNTER — Ambulatory Visit: Payer: Medicare PPO | Admitting: *Deleted

## 2014-08-06 NOTE — Patient Outreach (Signed)
[facility]) Care Management  08/06/2014  ZAREEN JAMISON 04-May-1950 [id number]    Phone call from [place] stating that she received a call from patient cancelling today's home visit due to a death in the family.  Home visit re-scheduled for 08/18/14 at 11:30am.   Sheralyn Boatman [facility] Care Management [phone]

## 2014-08-06 NOTE — Patient Outreach (Signed)
Attempt made to contact pt to reschedule today's home visit as this RN CM received a voice message early this morning From  pt requesting to cancel today's visit, has a death in the family, call to reschedule.   Pt's phone kept ringing, unable to leave a voice message, plan to try again.      Zara Chess.   [place] Care Management  [phone]

## 2014-08-07 ENCOUNTER — Other Ambulatory Visit: Payer: Self-pay | Admitting: *Deleted

## 2014-08-07 NOTE — Patient Outreach (Signed)
Follow up call to pt, reschedule home visit.   Spoke with pt to reschedule home visit as pt cancelled previous one due to death in the family.  As discussed with pt, home visit scheduled for 6/14.  Informed pt [name] SW would like to do a joint visit to which pt agreed.    Plan to see pt 6/14, do joint  With [name] SW.    Zara Chess.   [place] Care Management  [phone]

## 2014-08-14 ENCOUNTER — Encounter: Payer: Self-pay | Admitting: *Deleted

## 2014-08-18 ENCOUNTER — Other Ambulatory Visit: Payer: Self-pay | Admitting: *Deleted

## 2014-08-18 VITALS — BP 100/80 | HR 59 | Resp 24

## 2014-08-18 DIAGNOSIS — I509 Heart failure, unspecified: Secondary | ICD-10-CM

## 2014-08-18 NOTE — Patient Outreach (Signed)
[place] [facility]) Care Management  [facility] Social Work  08/18/2014  Loretta Wade 04-16-1950 [id number]  Subjective:    Objective:   Current Medications:  Current Outpatient Prescriptions  Medication Sig Dispense Refill  . albuterol (PROVENTIL HFA;VENTOLIN HFA) 108 (90 BASE) MCG/ACT inhaler Inhale into the lungs every 6 (six) hours as needed.    . cephALEXin (KEFLEX) 500 MG capsule Take 1 capsule (500 mg total) by mouth 4 (four) times daily. (Patient not taking: Reported on 08/18/2014) 28 capsule 0  . cholecalciferol (VITAMIN D) 1000 UNITS tablet     . dexlansoprazole (DEXILANT) 60 MG capsule Take 60 mg by mouth daily.    . DULoxetine (CYMBALTA) 60 MG capsule Take 60 mg by mouth 2 (two) times daily.    Marland Kitchen etodolac (LODINE) 300 MG capsule Take 300 mg by mouth every 8 (eight) hours.    Marland Kitchen etodolac (LODINE) 400 MG tablet Take 400 mg by mouth 2 (two) times daily.    . fish oil-omega-3 fatty acids 1000 MG capsule     . Fluticasone Furoate-Vilanterol 100-25 MCG/INH AEPB Inhale into the lungs daily.    . Fluticasone-Salmeterol (ADVAIR) 250-50 MCG/DOSE AEPB Inhale 1 puff into the lungs 2 (two) times daily.    . furosemide (LASIX) 20 MG tablet Take 20 mg by mouth.    . hydrochlorothiazide (HYDRODIURIL) 25 MG tablet Take 25 mg by mouth daily.    Marland Kitchen ipratropium-albuterol (DUONEB) 0.5-2.5 (3) MG/3ML SOLN Take 3 mLs by nebulization as needed. Every 6 hours as needed.  Pt taking twice a day    . ipratropium-albuterol (DUONEB) 0.5-2.5 (3) MG/3ML SOLN Take 3 mLs by nebulization. Pt takes twice a day    . Magnesium 250 MG TABS Take by mouth.    . magnesium gluconate (MAGONATE) 500 MG tablet Take 500 mg by mouth daily.    Marland Kitchen olmesartan (BENICAR) 20 MG tablet Take 20 mg by mouth daily.    . pantoprazole (PROTONIX) 40 MG tablet Take 40 mg by mouth daily.    . pregabalin (LYRICA) 150 MG capsule Take 150 mg by mouth 2 (two) times daily.    . propranolol (INDERAL) 80 MG tablet     . rOPINIRole  (REQUIP) 0.25 MG tablet Take 0.25 mg by mouth 3 (three) times daily.    Marland Kitchen tiotropium (SPIRIVA) 18 MCG inhalation capsule Place 18 mcg into inhaler and inhale daily.    . tizanidine (ZANAFLEX) 2 MG capsule     . vitamin E 400 UNIT capsule Take 400 Units by mouth daily.     No current facility-administered medications for this visit.    Functional Status:  In your present state of health, do you have any difficulty performing the following activities: 07/06/2014  Hearing? N  Vision? N  Difficulty concentrating or making decisions? N  Walking or climbing stairs? Y  Dressing or bathing? N  Doing errands, shopping? Y  Preparing Food and eating ? Y  Using the Toilet? Y  In the past six months, have you accidently leaked urine? Y  Do you have problems with loss of bowel control? N  Managing your Medications? N  Managing your Finances? N  Housekeeping or managing your Housekeeping? Y    Fall/Depression Screening:  PHQ 2/9 Scores 07/06/2014  PHQ - 2 Score 6  PHQ- 9 Score 17    Assessment:  Home visit completed.  Per patient, she has not contacted PACE yet. Per patient, a representative called from the PACE program, however she has not  called them back.  Per patient, she is concerned about the cost due to the fact that she does not have full Medicaid benefits.  Benefits discussed  encouragement provided to contact intake coordinator to discuss potential financial obligations  Patient  denies need for anti-depressant at this time.  Referral to [facility] program discussed. States that her main concern is increasing her mobility.    Patient discussed need for a walker with wheels and a larger shower chair to avoid falls.  Patient encouraged to request this through [place].  Financial aid form completed.  Patient also discussed need for low cost dental care.  Plan: Referral made to [facility] for low cost dental care.  CSW to follow up on DME request.    Sheralyn Boatman [facility] Care Management [phone]

## 2014-08-19 NOTE — Patient Outreach (Signed)
[facility]) Care Management  [place]  08/19/2014   Loretta Wade 29-May-1950 [id number]  Subjective:   Co visit done with [name] SW.  Pt states f/u with [name] 5/26, looked at her legs.  Pt states she finished antibiotic.  Pt state  MD  did change her Etodolac (335m three times a day), reports no improvement seen.  Pt states MD is to refer her  to a lung MD, did not hear back yet.  Pt states sob on and off, but  getting better, stays indoors, exercises by walking in her apartment.  Pt states [facility] [place] still coming, to come next week.  Pt states  Nurse mentioned she could get a new walker through her insurance.  Pt also states she received a quote from her  Insurance for cost of shower chair-  $24 but could not afford it at that time.    Objective:  Alert and orientated x 3, lungs clear.  Continues on O2 2L [place].    Filed Vitals:   08/18/14 1408  BP: 100/80  Pulse: 59  Resp: 24    Current Medications:  Current Outpatient Prescriptions  Medication Sig Dispense Refill  . albuterol (PROVENTIL HFA;VENTOLIN HFA) 108 (90 BASE) MCG/ACT inhaler Inhale into the lungs every 6 (six) hours as needed.    . cholecalciferol (VITAMIN D) 1000 UNITS tablet     . dexlansoprazole (DEXILANT) 60 MG capsule Take 60 mg by mouth daily.    .Marland Kitchenetodolac (LODINE) 300 MG capsule Take 300 mg by mouth every 8 (eight) hours.    . fish oil-omega-3 fatty acids 1000 MG capsule     . furosemide (LASIX) 20 MG tablet Take 20 mg by mouth.    .Marland Kitchenipratropium-albuterol (DUONEB) 0.5-2.5 (3) MG/3ML SOLN Take 3 mLs by nebulization as needed. Every 6 hours as needed.  Pt taking twice a day    . ipratropium-albuterol (DUONEB) 0.5-2.5 (3) MG/3ML SOLN Take 3 mLs by nebulization. Pt takes twice a day    . magnesium gluconate (MAGONATE) 500 MG tablet Take 500 mg by mouth daily.    .Marland Kitchenolmesartan (BENICAR) 20 MG tablet Take 20 mg by mouth daily.    . pantoprazole (PROTONIX) 40 MG tablet Take 40 mg by  mouth daily.    . pregabalin (LYRICA) 150 MG capsule Take 150 mg by mouth 2 (two) times daily.    . propranolol (INDERAL) 80 MG tablet     . rOPINIRole (REQUIP) 0.25 MG tablet Take 0.25 mg by mouth 3 (three) times daily.    .Marland Kitchentiotropium (SPIRIVA) 18 MCG inhalation capsule Place 18 mcg into inhaler and inhale daily.    . vitamin E 400 UNIT capsule Take 400 Units by mouth daily.    . cephALEXin (KEFLEX) 500 MG capsule Take 1 capsule (500 mg total) by mouth 4 (four) times daily. (Patient not taking: Reported on 08/18/2014) 28 capsule 0  . DULoxetine (CYMBALTA) 60 MG capsule Take 60 mg by mouth 2 (two) times daily.    .Marland Kitchenetodolac (LODINE) 400 MG tablet Take 400 mg by mouth 2 (two) times daily.    . Fluticasone Furoate-Vilanterol 100-25 MCG/INH AEPB Inhale into the lungs daily.    . Fluticasone-Salmeterol (ADVAIR) 250-50 MCG/DOSE AEPB Inhale 1 puff into the lungs 2 (two) times daily.    . hydrochlorothiazide (HYDRODIURIL) 25 MG tablet Take 25 mg by mouth daily.    . Magnesium 250 MG TABS Take by mouth.    . tizanidine (  ZANAFLEX) 2 MG capsule      No current facility-administered medications for this visit.     Assessment:   COPD- stable, pt reports MD to refer her to Lung MD.  Safety- need for new walker and shower chair.                             Plan:  Pt to f/u with nurse next week, discuss getting new walker.            Pt to f/u with [name] to see about referral for Lung MD            COPD- Pt to stay hydrated especially with warm weather, increase exercise.            RN CM to continue to provide community nurse case management services, update care plan as needed.            Next home visit 7/13.  [facility] CM Care Plan Problem One        Patient Outreach from 08/18/2014 in [place] CM Short Term Goal #1 (0-30 days)  patient to be followed by [place] program within 30 days   Interventions for Short Term Goal #1  referral completed and submitted for  [place] Problem Two        Patient Outreach from 08/18/2014 in [place] for Problem Two  Not Active    [facility] CM Care Plan Problem Three        Patient Outreach from 08/18/2014 in [place] for Problem Three  Active   [facility] Long Term Goal (31-90) days  Pt's breathing would  improve within the next 60 days    [facility] Long Term Goal Start Date  07/06/14   [facility] Long Term Goal Met Date  08/18/14   Interventions for Problem Three Long Term Goal  -- [note- RN CM to still provide scale, no edema today.  ]   [facility] CM Short Term Goal #1 Met Date  08/18/14   [facility] CM Short Term Goal #2 (0-30 days)  Pt would be familiar with yellow zones in COPD action plan in the next 30 days    [facility] CM Short Term Goal #2 Start Date  08/18/14   Interventions for Short Term Goal #2  provided pt with COPD action plan- yellow zone reviewed    [facility] CM Short Term Goal #3 (0-30 days)  Pt would increase her exercise, doing sit down exercises three times a week  in the next 30 days    [facility]  CM Short Term Goal #3 Start Date  08/18/14   Interventions for Short Term Goal #3  Discussed with pt importance of exercise- could help improve lung function

## 2014-08-23 ENCOUNTER — Encounter: Payer: Self-pay | Admitting: Emergency Medicine

## 2014-08-23 ENCOUNTER — Other Ambulatory Visit: Payer: Self-pay

## 2014-08-23 ENCOUNTER — Emergency Department
Admission: EM | Admit: 2014-08-23 | Discharge: 2014-08-23 | Disposition: A | Discharge status: Home / Self Care | Payer: Medicare PPO | Attending: Emergency Medicine | Admitting: Emergency Medicine

## 2014-08-23 ENCOUNTER — Emergency Department: Payer: Medicare PPO

## 2014-08-23 DIAGNOSIS — R609 Edema, unspecified: Secondary | ICD-10-CM

## 2014-08-23 DIAGNOSIS — Z87891 Personal history of nicotine dependence: Secondary | ICD-10-CM | POA: Diagnosis not present

## 2014-08-23 DIAGNOSIS — Z79899 Other long term (current) drug therapy: Secondary | ICD-10-CM | POA: Diagnosis not present

## 2014-08-23 DIAGNOSIS — J441 Chronic obstructive pulmonary disease with (acute) exacerbation: Secondary | ICD-10-CM | POA: Insufficient documentation

## 2014-08-23 DIAGNOSIS — I10 Essential (primary) hypertension: Secondary | ICD-10-CM | POA: Diagnosis not present

## 2014-08-23 DIAGNOSIS — Z7951 Long term (current) use of inhaled steroids: Secondary | ICD-10-CM | POA: Diagnosis not present

## 2014-08-23 DIAGNOSIS — R600 Localized edema: Secondary | ICD-10-CM | POA: Diagnosis not present

## 2014-08-23 DIAGNOSIS — R531 Weakness: Secondary | ICD-10-CM | POA: Insufficient documentation

## 2014-08-23 LAB — CBC
HCT: 36.1 % (ref 35.0–47.0)
Hemoglobin: 11.5 g/dL — ABNORMAL LOW (ref 12.0–16.0)
MCH: 30.9 pg (ref 26.0–34.0)
MCHC: 31.9 g/dL — ABNORMAL LOW (ref 32.0–36.0)
MCV: 96.8 fL (ref 80.0–100.0)
Platelets: 184 10*3/uL (ref 150–440)
RBC: 3.73 MIL/uL — ABNORMAL LOW (ref 3.80–5.20)
RDW: 15.4 % — AB (ref 11.5–14.5)
WBC: 4.8 10*3/uL (ref 3.6–11.0)

## 2014-08-23 LAB — URINALYSIS COMPLETE WITH MICROSCOPIC (ARMC ONLY)
Bilirubin Urine: NEGATIVE
Glucose, UA: NEGATIVE mg/dL
HGB URINE DIPSTICK: NEGATIVE
Ketones, ur: NEGATIVE mg/dL
Nitrite: NEGATIVE
Protein, ur: NEGATIVE mg/dL
RBC / HPF: NONE SEEN RBC/hpf (ref 0–5)
Specific Gravity, Urine: 1.013 (ref 1.005–1.030)
pH: 5 (ref 5.0–8.0)

## 2014-08-23 LAB — POTASSIUM: Potassium: 4.3 mmol/L (ref 3.5–5.1)

## 2014-08-23 LAB — BASIC METABOLIC PANEL
Anion gap: 8 (ref 5–15)
BUN: 23 mg/dL — ABNORMAL HIGH (ref 6–20)
CO2: 27 mmol/L (ref 22–32)
Calcium: 9.2 mg/dL (ref 8.9–10.3)
Chloride: 105 mmol/L (ref 101–111)
Creatinine, Ser: 0.99 mg/dL (ref 0.44–1.00)
GFR calc non Af Amer: >60 mL/min (ref >60)
Glucose, Bld: 98 mg/dL (ref 65–99)
POTASSIUM: 5.5 mmol/L — AB (ref 3.5–5.1)
Sodium: 140 mmol/L (ref 135–145)

## 2014-08-23 LAB — TROPONIN I

## 2014-08-23 MED ORDER — TRAZODONE HCL 100 MG PO TABS
ORAL_TABLET | ORAL | Status: AC
  Filled 2014-08-23: qty 1

## 2014-08-23 MED ORDER — TRAMADOL HCL 50 MG PO TABS: 100.0000 mg | ORAL_TABLET | Freq: Once | ORAL | Status: DC

## 2014-08-23 NOTE — ED Notes (Signed)
NAD noted at time of D/C. Pt taken to lobby via wheelchair with RN assistance.

## 2014-08-23 NOTE — ED Notes (Signed)
Per EMS pt presents from home with a hx of COPD. EMS states pt C/O chest pain, swelling to her face, and ringing in her ear that started today. Pt states she began having chest pain and weakness since yesterday. Pt is on 2L chronic. Per EMS VSS. Pt on 2L chronic at home.

## 2014-08-23 NOTE — ED Provider Notes (Signed)
Patient is awake alert and stable condition. She does have borderline hypotension, however this has also been examined and previous evaluation on 614. She's been seen by Dr. Kerman Passey, with plan to discharge with urine negative. Urinalysis does not appear to show obvious infection. I did send a urine culture, based on current UA would not recommend antibiotic. Patient in addition has normal white count and is afebrile.  Discharge home Follow up with primary care advised. Return precautions discussed.    Delman Kitten, MD 08/23/14 [phone]

## 2014-08-23 NOTE — ED Provider Notes (Signed)
[facility] Emergency Department Provider Note  Time seen: 2:09 PM  I have reviewed the triage vital signs and the nursing notes.   HISTORY  Chief Complaint Weakness; Otalgia; Facial Swelling; and Chest Pain    HPI Loretta Wade is a 64 y.o. female with a past medical history of arthritis, fibromyalgia, sleep apnea, hypertension, COPD who presents to the emergency department with chest pain for the last 2 days intermittently, along with some shortness of breath. Patient also states facial swelling since yesterday as well, and right ear ringing. Patient denies any fever, cough or congestion. She states general fatigue 2 days as well. Denies any history of myocardial infarction, but does state congestive heart failure history. States her chest pain is moderate, pressure sensation and tightness located mostly in the mid chest. It comes and goes lasting minutes at a time and then resolves on its own. Denies any chest pain at this time, but does state shortness of breath and facial swelling currently.     Past Medical History  Diagnosis Date  . OA (osteoarthritis)   . Fibromyalgia   . Anemia   . Sleep apnea   . Fatigue   . Edema   . Esophageal reflux   . Nocturia   . Hypertension   . COPD (chronic obstructive pulmonary disease)     There are no active problems to display for this patient.   Past Surgical History  Procedure Laterality Date  . Cholecystectomy    . Total knee arthroplasty      right  . Vesicovaginal fistula closure w/ tah    . Abdominal hysterectomy      Current Outpatient Rx  Name  Route  Sig  Dispense  Refill  . albuterol (PROVENTIL HFA;VENTOLIN HFA) 108 (90 BASE) MCG/ACT inhaler   Inhalation   Inhale into the lungs every 6 (six) hours as needed.         . cephALEXin (KEFLEX) 500 MG capsule   Oral   Take 1 capsule (500 mg total) by mouth 4 (four) times daily. Patient not taking: Reported on 08/18/2014   28 capsule   0   . cholecalciferol (VITAMIN D) 1000 UNITS tablet               . dexlansoprazole (DEXILANT) 60 MG capsule   Oral   Take 60 mg by mouth daily.         . DULoxetine (CYMBALTA) 60 MG capsule   Oral   Take 60 mg by mouth 2 (two) times daily.         Marland Kitchen etodolac (LODINE) 300 MG capsule   Oral   Take 300 mg by mouth every 8 (eight) hours.         Marland Kitchen etodolac (LODINE) 400 MG tablet   Oral   Take 400 mg by mouth 2 (two) times daily.         . fish oil-omega-3 fatty acids 1000 MG capsule               . Fluticasone Furoate-Vilanterol 100-25 MCG/INH AEPB   Inhalation   Inhale into the lungs daily.         . Fluticasone-Salmeterol (ADVAIR) 250-50 MCG/DOSE AEPB   Inhalation   Inhale 1 puff into the lungs 2 (two) times daily.         . furosemide (LASIX) 20 MG tablet   Oral   Take 20 mg by mouth.         Marland Kitchen  hydrochlorothiazide (HYDRODIURIL) 25 MG tablet   Oral   Take 25 mg by mouth daily.         Marland Kitchen ipratropium-albuterol (DUONEB) 0.5-2.5 (3) MG/3ML SOLN   Nebulization   Take 3 mLs by nebulization as needed. Every 6 hours as needed.  Pt taking twice a day         . ipratropium-albuterol (DUONEB) 0.5-2.5 (3) MG/3ML SOLN   Nebulization   Take 3 mLs by nebulization. Pt takes twice a day         . Magnesium 250 MG TABS   Oral   Take by mouth.         . magnesium gluconate (MAGONATE) 500 MG tablet   Oral   Take 500 mg by mouth daily.         Marland Kitchen olmesartan (BENICAR) 20 MG tablet   Oral   Take 20 mg by mouth daily.         . pantoprazole (PROTONIX) 40 MG tablet   Oral   Take 40 mg by mouth daily.         . pregabalin (LYRICA) 150 MG capsule   Oral   Take 150 mg by mouth 2 (two) times daily.         . propranolol (INDERAL) 80 MG tablet               . rOPINIRole (REQUIP) 0.25 MG tablet   Oral   Take 0.25 mg by mouth 3 (three) times daily.         Marland Kitchen tiotropium (SPIRIVA) 18 MCG inhalation capsule   Inhalation   Place 18 mcg into  inhaler and inhale daily.         . tizanidine (ZANAFLEX) 2 MG capsule               . vitamin E 400 UNIT capsule   Oral   Take 400 Units by mouth daily.           Allergies Review of patient's allergies indicates no known allergies.  Family History  Problem Relation Age of Onset  . Hypertension Mother   . Diabetes    . Breast cancer Daughter     Social History History  Substance Use Topics  . Smoking status: Former Research scientist (life sciences)  . Smokeless tobacco: Not on file  . Alcohol Use: No    Review of Systems Constitutional: Negative for fever. Positive for generalized weakness. Cardiovascular: Intermittent chest pain 2 days. Respiratory: Intermittent shortness of breath. Gastrointestinal: Negative for abdominal pain, vomiting and diarrhea. Genitourinary: Negative for dysuria. Neurological: Negative for headaches, focal weakness  10-point ROS otherwise negative.  ____________________________________________   PHYSICAL EXAM:  VITAL SIGNS: ED Triage Vitals  Enc Vitals Group     BP 08/23/14 1303 106/67 mmHg     Pulse Rate 08/23/14 1303 66     Resp 08/23/14 1303 20     Temp 08/23/14 1303 97.8 F (36.6 C)     Temp Source 08/23/14 1303 Oral     SpO2 08/23/14 1303 98 %     Weight 08/23/14 1303 400 lb (181.439 kg)     Height 08/23/14 1303 5\' 5"  (1.651 m)     Head Cir --      Peak Flow --      Pain Score 08/23/14 1301 8     Pain Loc --      Pain Edu? --      Excl. in [place]? --     Constitutional: Alert and  oriented. No distress. No respiratory distress. Eyes: Normal exam ENT   Head: Normocephalic and atraumatic. No notable swelling on exam. Cardiovascular: Normal rate, regular rhythm. No murmur Respiratory: Normal respiratory effort without tachypnea nor retractions. Mild wheeze bilaterally on expiration. Gastrointestinal: Soft and nontender. No distention.   Musculoskeletal: Nontender with normal range of motion in all extremities.  Neurologic:  Normal speech  and language. No gross focal neurologic deficits  Skin:  Skin is warm, dry and intact.  Psychiatric: Mood and affect are normal. Speech and behavior are normal.  ____________________________________________    EKG  EKG reviewed and interpreted by myself shows normal sinus rhythm at 60 bpm, narrow QRS, normal axis, normal intervals, no ST changes noted. Overall normal EKG.  ____________________________________________    RADIOLOGY  Chest x-ray within normal limits.  ____________________________________________    INITIAL IMPRESSION / ASSESSMENT AND PLAN / ED COURSE  Pertinent labs & imaging results that were available during my care of the patient were reviewed by me and considered in my medical decision making (see chart for details).  Patient presents with intermittent chest pain 2 days, also generalized fatigue, and intermittent shortness breath. We will check labs, chest x-ray, and closely monitor in the emergency department. Patient's EKG appears very well at this time.  ----------------------------------------- 2:19 PM on 08/23/2014 -----------------------------------------  Mild hyperkalemia on laboratory results, EKG is within normal limits. Lab results as possible hemolyzed sample, we will recently new potassium to verify results.  Repeat potassium within normal limits. Chest x-ray negative. Troponin negative.  Currently awaiting urinalysis results, all other results are largely normal. Anticipate likely discharge home with primary care follow-up. Patient agreeable to plan. I discussed results with the patient. Patient care signed out to Dr. Jacqualine Code.  ____________________________________________   FINAL CLINICAL IMPRESSION(S) / ED DIAGNOSES  Fatigue Chest pain  dyspnea   Harvest Dark, MD 08/23/14 1507

## 2014-08-23 NOTE — ED Notes (Signed)
Pt assisted to the toilet by RN. Pt SOB upon return to bed. Urine sample collected.

## 2014-08-23 NOTE — Discharge Instructions (Signed)
Weakness Weakness is a lack of strength. You may feel weak all over your body or just in one part of your body. Weakness can be serious. In some cases, you may need more medical tests. HOME [place] a well-balanced diet.  Try to exercise every day.  Only take medicines as told by your doctor. GET HELP RIGHT AWAY IF:   You cannot do your normal daily activities.  You cannot walk up and down stairs, or you feel very tired when you do so.  You have shortness of breath or chest pain.  You have trouble moving parts of your body.  You have weakness in only one body part or on only one side of the body.  You have a fever.  You have trouble speaking or swallowing.  You cannot control when you pee (urinate) or poop (bowel movement).  You have black or bloody throw up (vomit) or poop.  Your weakness gets worse or spreads to other body parts.  You have new aches or pains. MAKE SURE YOU:   Understand these instructions.  Will watch your condition.  Will get help right away if you are not doing well or get worse. Document Released: 02/03/2008 Document Revised: 08/22/2011 Document Reviewed: 04/21/2011 [facility] Patient Information 2015 [place], [place]. This information is not intended to replace advice given to you by your health care provider. Make sure you discuss any questions you have with your health care provider.    Please drink plenty of fluids of the next several days, follow-up with your primary care doctor this week. Return to the emergency department for any personally concerning symptoms.

## 2014-08-26 LAB — URINE CULTURE: Culture: >=100000

## 2014-09-01 ENCOUNTER — Emergency Department: Payer: Medicare PPO

## 2014-09-01 ENCOUNTER — Other Ambulatory Visit: Payer: Self-pay

## 2014-09-01 ENCOUNTER — Encounter: Payer: Self-pay | Admitting: Emergency Medicine

## 2014-09-01 ENCOUNTER — Emergency Department
Admission: EM | Admit: 2014-09-01 | Discharge: 2014-09-01 | Disposition: A | Discharge status: Home / Self Care | Payer: Medicare PPO | Attending: Emergency Medicine | Admitting: Emergency Medicine

## 2014-09-01 DIAGNOSIS — M546 Pain in thoracic spine: Secondary | ICD-10-CM | POA: Insufficient documentation

## 2014-09-01 DIAGNOSIS — G44219 Episodic tension-type headache, not intractable: Secondary | ICD-10-CM | POA: Insufficient documentation

## 2014-09-01 DIAGNOSIS — Z792 Long term (current) use of antibiotics: Secondary | ICD-10-CM | POA: Diagnosis not present

## 2014-09-01 DIAGNOSIS — Z87891 Personal history of nicotine dependence: Secondary | ICD-10-CM | POA: Insufficient documentation

## 2014-09-01 DIAGNOSIS — K088 Other specified disorders of teeth and supporting structures: Secondary | ICD-10-CM | POA: Diagnosis not present

## 2014-09-01 DIAGNOSIS — Z791 Long term (current) use of non-steroidal anti-inflammatories (NSAID): Secondary | ICD-10-CM | POA: Insufficient documentation

## 2014-09-01 DIAGNOSIS — I10 Essential (primary) hypertension: Secondary | ICD-10-CM | POA: Diagnosis not present

## 2014-09-01 DIAGNOSIS — Z7951 Long term (current) use of inhaled steroids: Secondary | ICD-10-CM | POA: Diagnosis not present

## 2014-09-01 DIAGNOSIS — K0889 Other specified disorders of teeth and supporting structures: Secondary | ICD-10-CM

## 2014-09-01 DIAGNOSIS — R0789 Other chest pain: Secondary | ICD-10-CM | POA: Diagnosis present

## 2014-09-01 DIAGNOSIS — Z79899 Other long term (current) drug therapy: Secondary | ICD-10-CM | POA: Insufficient documentation

## 2014-09-01 LAB — BASIC METABOLIC PANEL
ANION GAP: 5 (ref 5–15)
BUN: 16 mg/dL (ref 6–20)
CALCIUM: 9.4 mg/dL (ref 8.9–10.3)
CO2: 30 mmol/L (ref 22–32)
Chloride: 109 mmol/L (ref 101–111)
Creatinine, Ser: 0.91 mg/dL (ref 0.44–1.00)
GFR calc Af Amer: >60 mL/min (ref >60)
GLUCOSE: 121 mg/dL — AB (ref 65–99)
POTASSIUM: 4.1 mmol/L (ref 3.5–5.1)
Sodium: 144 mmol/L (ref 135–145)

## 2014-09-01 LAB — TROPONIN I

## 2014-09-01 LAB — CBC
HEMATOCRIT: 37 % (ref 35.0–47.0)
HEMOGLOBIN: 11.6 g/dL — AB (ref 12.0–16.0)
MCH: 30.6 pg (ref 26.0–34.0)
MCHC: 31.4 g/dL — ABNORMAL LOW (ref 32.0–36.0)
MCV: 97.4 fL (ref 80.0–100.0)
Platelets: 198 10*3/uL (ref 150–440)
RBC: 3.8 MIL/uL (ref 3.80–5.20)
RDW: 15.1 % — ABNORMAL HIGH (ref 11.5–14.5)
WBC: 5.1 10*3/uL (ref 3.6–11.0)

## 2014-09-01 MED ORDER — KETOROLAC TROMETHAMINE 30 MG/ML IJ SOLN
INTRAMUSCULAR | Status: AC
  Administered 2014-09-01: 30 mg via INTRAVENOUS
  Filled 2014-09-01: qty 1

## 2014-09-01 MED ORDER — ACETAMINOPHEN 325 MG PO TABS
ORAL_TABLET | ORAL | Status: AC
  Administered 2014-09-01: 975 mg via ORAL
  Filled 2014-09-01: qty 3

## 2014-09-01 MED ORDER — KETOROLAC TROMETHAMINE 30 MG/ML IJ SOLN
30.0000 mg | Freq: Once | INTRAMUSCULAR | Status: AC
  Administered 2014-09-01: 30 mg via INTRAVENOUS

## 2014-09-01 MED ORDER — PENICILLIN V POTASSIUM 500 MG PO TABS: 500.0000 mg | ORAL_TABLET | Freq: Three times a day (TID) | ORAL | Status: DC

## 2014-09-01 MED ORDER — PENICILLIN V POTASSIUM 250 MG PO TABS
ORAL_TABLET | ORAL | Status: AC
  Administered 2014-09-01: 500 mg via ORAL
  Filled 2014-09-01: qty 2

## 2014-09-01 MED ORDER — ACETAMINOPHEN 500 MG PO TABS
1000.0000 mg | ORAL_TABLET | Freq: Once | ORAL | Status: AC
  Administered 2014-09-01: 975 mg via ORAL

## 2014-09-01 MED ORDER — PENICILLIN V POTASSIUM 250 MG PO TABS
500.0000 mg | ORAL_TABLET | Freq: Once | ORAL | Status: AC
  Administered 2014-09-01: 500 mg via ORAL

## 2014-09-01 NOTE — ED Notes (Signed)
Patient waiting for ride in room.  On home oxygen 3L/[place]

## 2014-09-01 NOTE — ED Provider Notes (Signed)
[facility] Emergency Department Provider Note  ____________________________________________  Time seen: 2:10 PM  I have reviewed the triage vital signs and the nursing notes.   HISTORY  Chief Complaint Chest Pain and Headache     HPI Loretta Wade is a 64 y.o. female who presents complaining of left-sided chest pain. This is been going on for 3-4 days. She reports she has had similar symptoms previously. It hurts when she moves. She is tender to touch. She describes this pain as being "around her heart. She denies nausea, vomiting, or diaphoresis.  In addition to the chest pain, the patient complains of a headache. This is primarily in her bilateral temples. This is been occurring for 3-4 days as well. She denies any fevers.  After interviewing exam, the patient mentions that she thinks she may have an infection in her gums. She is edentulous on the top and has a few teeth with some decay on the bottom. She is reporting that she is having pain and irritation in her gums.  She reports she has been seen multiple times in the emergency department for similar complaints. She was seen here last on 08/23/2014.   Past Medical History  Diagnosis Date  . OA (osteoarthritis)   . Fibromyalgia   . Anemia   . Sleep apnea   . Fatigue   . Edema   . Esophageal reflux   . Nocturia   . Hypertension   . COPD (chronic obstructive pulmonary disease)     There are no active problems to display for this patient.   Past Surgical History  Procedure Laterality Date  . Cholecystectomy    . Total knee arthroplasty      right  . Vesicovaginal fistula closure w/ tah    . Abdominal hysterectomy      Current Outpatient Rx  Name  Route  Sig  Dispense  Refill  . albuterol (PROVENTIL HFA;VENTOLIN HFA) 108 (90 BASE) MCG/ACT inhaler   Inhalation   Inhale into the lungs every 6 (six) hours as needed.         . cephALEXin (KEFLEX) 500 MG capsule   Oral   Take 1  capsule (500 mg total) by mouth 4 (four) times daily. Patient not taking: Reported on 08/18/2014   28 capsule   0   . cholecalciferol (VITAMIN D) 1000 UNITS tablet               . dexlansoprazole (DEXILANT) 60 MG capsule   Oral   Take 60 mg by mouth daily.         . DULoxetine (CYMBALTA) 60 MG capsule   Oral   Take 60 mg by mouth 2 (two) times daily.         Marland Kitchen etodolac (LODINE) 300 MG capsule   Oral   Take 300 mg by mouth every 8 (eight) hours.         Marland Kitchen etodolac (LODINE) 400 MG tablet   Oral   Take 400 mg by mouth 2 (two) times daily.         . fish oil-omega-3 fatty acids 1000 MG capsule               . Fluticasone Furoate-Vilanterol 100-25 MCG/INH AEPB   Inhalation   Inhale into the lungs daily.         . Fluticasone-Salmeterol (ADVAIR) 250-50 MCG/DOSE AEPB   Inhalation   Inhale 1 puff into the lungs 2 (two) times daily.         Marland Kitchen  furosemide (LASIX) 20 MG tablet   Oral   Take 20 mg by mouth.         . hydrochlorothiazide (HYDRODIURIL) 25 MG tablet   Oral   Take 25 mg by mouth daily.         Marland Kitchen ipratropium-albuterol (DUONEB) 0.5-2.5 (3) MG/3ML SOLN   Nebulization   Take 3 mLs by nebulization as needed. Every 6 hours as needed.  Pt taking twice a day         . ipratropium-albuterol (DUONEB) 0.5-2.5 (3) MG/3ML SOLN   Nebulization   Take 3 mLs by nebulization. Pt takes twice a day         . Magnesium 250 MG TABS   Oral   Take by mouth.         . magnesium gluconate (MAGONATE) 500 MG tablet   Oral   Take 500 mg by mouth daily.         Marland Kitchen olmesartan (BENICAR) 20 MG tablet   Oral   Take 20 mg by mouth daily.         . pantoprazole (PROTONIX) 40 MG tablet   Oral   Take 40 mg by mouth daily.         . penicillin v potassium (VEETID) 500 MG tablet   Oral   Take 1 tablet (500 mg total) by mouth 3 (three) times daily.   15 tablet   0   . pregabalin (LYRICA) 150 MG capsule   Oral   Take 150 mg by mouth 2 (two) times  daily.         . propranolol (INDERAL) 80 MG tablet               . rOPINIRole (REQUIP) 0.25 MG tablet   Oral   Take 0.25 mg by mouth 3 (three) times daily.         Marland Kitchen tiotropium (SPIRIVA) 18 MCG inhalation capsule   Inhalation   Place 18 mcg into inhaler and inhale daily.         . tizanidine (ZANAFLEX) 2 MG capsule               . vitamin E 400 UNIT capsule   Oral   Take 400 Units by mouth daily.           Allergies Review of patient's allergies indicates no known allergies.  Family History  Problem Relation Age of Onset  . Hypertension Mother   . Diabetes    . Breast cancer Daughter     Social History History  Substance Use Topics  . Smoking status: Former Research scientist (life sciences)  . Smokeless tobacco: Not on file  . Alcohol Use: No    Review of Systems  Constitutional: Negative for fever. ENT: Negative for sore throat. Positive for tenderness in her gums. Cardiovascular: Positive for chest pain. Respiratory: Negative for shortness of breath. Gastrointestinal: Negative for abdominal pain, vomiting and diarrhea. Genitourinary: Negative for dysuria. Musculoskeletal: Positive for left-sided chest pain and left back pain.. Skin: Negative for rash. Neurological: Negative for headaches   10-point ROS otherwise negative.  ____________________________________________   PHYSICAL EXAM:  VITAL SIGNS: ED Triage Vitals  Enc Vitals Group     BP 09/01/14 1343 116/59 mmHg     Pulse Rate 09/01/14 1343 68     Resp --      Temp 09/01/14 1343 97.9 F (36.6 C)     Temp Source 09/01/14 1343 Oral     SpO2 09/01/14 1343 96 %  Weight 09/01/14 1343 400 lb (181.439 kg)     Height 09/01/14 1343 5\' 5"  (1.651 m)     Head Cir --      Peak Flow --      Pain Score 09/01/14 1344 8     Pain Loc --      Pain Edu? --      Excl. in [place]? --     Constitutional: Alert and oriented. Large body habitus. Notably large breasts. ENT   Head: Normocephalic and atraumatic.    Nose: No congestion/rhinnorhea.      Mouth:  Upper teeth are absent. No redness or inflammation or sign of irritation of the upper gums. The lower gums do not appear inflamed either, though there are a number of decaying small teeth present. These may be triggering some discomfort or inflammation for the patient. Cardiovascular: Normal rate, regular rhythm, no murmur noted Chest wall: Notably tender left chest wall. This wraps from the lateral chest and includes tenderness in her left upper back.  Respiratory:  Normal respiratory effort, no tachypnea.    Breath sounds are clear and equal bilaterally.  Gastrointestinal: Soft and nontender. No distention.  Back: Tenderness in the left upper back.  Musculoskeletal: No deformity noted. Nontender with normal range of motion in all extremities.  Neurologic:  Normal speech and language. No gross focal neurologic deficits are appreciated.  Skin:  Skin is warm, dry. No rash noted. Psychiatric: Patient is alert and communicative. She appears frustrated with her intermittent pains that have been occurring over months.  ____________________________________________    LABS (pertinent positives/negatives)  CBC: Hemoglobin 11.6, otherwise within normal limits. Metabolic panel and troponin are pending.  ____________________________________________   EKG  ED ECG REPORT I, Kewon Statler W, the attending physician, personally viewed and interpreted this ECG.   Date: 09/01/2014  EKG Time: 1335  Rate: 68  Rhythm: sinus rhythm PACs.  Axis: Normal  Intervals: Normal  ST&T Change: None noted   ____________________________________________    RADIOLOGY  Chest x-ray: IMPRESSION: Limited study by patient's large body habitus. No gross infiltrate or pulmonary edema. Mild left basilar atelectasis.  ____________________________________________  INITIAL IMPRESSION / ASSESSMENT AND PLAN / ED COURSE  Pertinent labs & imaging results that were  available during my care of the patient were reviewed by me and considered in my medical decision making (see chart for details).  Patient with notable left chest wall tenderness. The pectoralis muscle particular is tender as well as the musculature around the left chest to the left upper back.   Her EKG is within normal limits.  A troponin have been ordered but apparently due to hemolysis was not able to be completed initially. This is been reordered. I have low suspicion for the discomfort coming from a cardiac source.   I have ordered Toradol for the patient. I will ask Dr. Edd Fabian to follow-up with the troponin level as well as a basic metabolic panel. If all of this is stable she may be discharged for follow-up care with her primary physician.   ____________________________________________   FINAL CLINICAL IMPRESSION(S) / ED DIAGNOSES  Final diagnoses:  Chest wall pain  Episodic tension-type headache, not intractable  Pain, dental      Ahmed Prima, MD 09/01/14 [phone]

## 2014-09-01 NOTE — Discharge Instructions (Signed)
You have notable tenderness in her left chest wall and the pectoralis muscle. Follow-up with your regular doctor for this. You report that your gums feel tender and inflamed. We are prescribing penicillin V for this. Follow-up with a dentist for ongoing care. Return to the emergency department if you have shortness of breath, worsening chest pain, more of a headache, any focal weakness, or other urgent concerns.  Chest Wall Pain Chest wall pain is pain in or around the bones and muscles of your chest. It may take up to 6 weeks to get better. It may take longer if you must stay physically active in your work and activities.  CAUSES  Chest wall pain may happen on its own. However, it may be caused by:  A viral illness like the flu.  Injury.  Coughing.  Exercise.  Arthritis.  Fibromyalgia.  Shingles. HOME CARE INSTRUCTIONS   Avoid overtiring physical activity. Try not to strain or perform activities that cause pain. This includes any activities using your chest or your abdominal and side muscles, especially if heavy weights are used.  Put ice on the sore area.  Put ice in a plastic bag.  Place a towel between your skin and the bag.  Leave the ice on for 15-20 minutes per hour while awake for the first 2 days.  Only take over-the-counter or prescription medicines for pain, discomfort, or fever as directed by your caregiver. SEEK IMMEDIATE MEDICAL CARE IF:   Your pain increases, or you are very uncomfortable.  You have a fever.  Your chest pain becomes worse.  You have new, unexplained symptoms.  You have nausea or vomiting.  You feel sweaty or lightheaded.  You have a cough with phlegm (sputum), or you cough up blood. MAKE SURE YOU:   Understand these instructions.  Will watch your condition.  Will get help right away if you are not doing well or get worse. Document Released: 02/20/2005 Document Revised: 05/15/2011 Document Reviewed: 10/17/2010 [facility] Patient  Information 2015 [place], [place]. This information is not intended to replace advice given to you by your health care provider. Make sure you discuss any questions you have with your health care provider.

## 2014-09-01 NOTE — ED Notes (Signed)
BMP/Troponin sent

## 2014-09-01 NOTE — ED Notes (Signed)
AAOx3.  Skin warm and dry.  No SOB/ DOE.  NAD 

## 2014-09-01 NOTE — ED Notes (Signed)
AAOx3.  Skin warm and dry.  D/c home

## 2014-09-01 NOTE — ED Notes (Signed)
Patient reports 3 day history of left upper chest and left breast pain.  Describes left upper chest pain as tightness and left breast pain as burning.  Pain is reproducible with light palpation.

## 2014-09-01 NOTE — ED Provider Notes (Signed)
-----------------------------------------   5:33 PM on 09/01/2014 -----------------------------------------  BMP and troponin unremarkable. Pain improved. DC with Dr. Ronni Rumble discharge instructions as above.  Joanne Gavel, MD 09/01/14 [phone]

## 2014-09-03 ENCOUNTER — Other Ambulatory Visit: Payer: Self-pay | Admitting: *Deleted

## 2014-09-03 NOTE — Patient Outreach (Signed)
Attempt made to contact pt as view in Epic shows pt went to [facility] ED 6/28- wanted to f/u and check on status.  Unable to leave a voice message as phone kept ringing.   Plan to try again.      Zara Chess.   [place] [facility]  [phone]

## 2014-09-03 NOTE — Patient Outreach (Signed)
Second attempt made to contact pt, f/u on recent ED visit.   Saw on phone pt tried to call RN CM back from earlier attempt but left no voice message.    Will wait for pt to call back again and if not, will try again.      Zara Chess.   [place] [facility]  [phone]

## 2014-09-04 ENCOUNTER — Other Ambulatory Visit: Payer: Self-pay | Admitting: *Deleted

## 2014-09-04 NOTE — Patient Outreach (Signed)
Another attempt made to contact pt, f/u on her  recent ED visit (6/28).  Again RN CM unable to leave voice message as phone kept ringing.   If no response today, plan to f/u again next week.     Next home visit with pt scheduled for 7/13.     Loretta Wade.   [place] [facility]  [phone]

## 2014-09-15 ENCOUNTER — Other Ambulatory Visit: Payer: Self-pay | Admitting: *Deleted

## 2014-09-15 NOTE — Patient Outreach (Signed)
Received a call from pt inquiring when home visit is with RN CM to which relayed is 7/13.   Discussed with pt attempts made to f/u with her following  recent ED visit.   Pt reports she has not f/u with MD, currently no chest pain, sob.  Pt states she has wheezing she can't get rid of to which RN CM encouraged pt to call MD, schedule an appointment- pt agreed.      RN CM to f/u with pt 7/13- home visit.    Zara Chess.   [place] [facility]  [phone]

## 2014-09-16 ENCOUNTER — Encounter: Payer: Self-pay | Admitting: *Deleted

## 2014-09-16 ENCOUNTER — Other Ambulatory Visit: Payer: Self-pay | Admitting: *Deleted

## 2014-09-16 VITALS — BP 134/80 | HR 66 | Resp 32 | Ht 64.0 in | Wt 359.0 lb

## 2014-09-16 DIAGNOSIS — I509 Heart failure, unspecified: Secondary | ICD-10-CM

## 2014-09-16 NOTE — Patient Outreach (Signed)
[facility]) Care Management   09/16/2014  Zarayah Lanting [place]vato [birth year] [id number]  Loretta Wade is an 64 y.o. female  Subjective:  Pt states did not call MD yesterday, wheezing still there, so far better.  Pt states her CPAP machine is  Broken, MD aware, was told by MD needs a new sleep study before getting a new CPAP.  Pt states MD told her unable to have  sleep study done at home but pt said she talked to Sleep study in [place], can have it done.  Pt states HH RN came last week, said she would f/u on  new walker, called her several times, did not get back to her.  Pt states her old walker  Is the reason she does not go out.   Pt  Reports when she has sweets she does not want anything else to eat, grandson likes to bring her sweets.  Pt states she would like to get down to 300 lbs.    Objective:   Filed Vitals:   09/16/14 1055  BP: 134/80  Pulse: 66  Resp: 32    ROS  Physical Exam  Constitutional: She is oriented to person, place, and time. She appears well-developed and well-nourished.  Cardiovascular: Normal rate.   Respiratory: She has wheezes.  Wheezes- Left upper lobe anterior on expiration, bilateral upper lobes on expiration.   Musculoskeletal:  Sob walking short distances.   Neurological: She is alert and oriented to person, place, and time.  Skin: Skin is warm and dry.  Psychiatric: She has a normal mood and affect. Her behavior is normal. Judgment and thought content normal.    Current Medications:  Medications reviewed with pt.  Current Outpatient Prescriptions  Medication Sig Dispense Refill  . albuterol (PROVENTIL HFA;VENTOLIN HFA) 108 (90 BASE) MCG/ACT inhaler Inhale 2 puffs into the lungs every 6 (six) hours as needed for wheezing or shortness of breath.     . cholecalciferol (VITAMIN D) 1000 UNITS tablet Take 1,000 Units by mouth daily.     Marland Kitchen dexlansoprazole (DEXILANT) 60 MG capsule Take 60 mg by mouth daily.    Marland Kitchen etodolac (LODINE)  300 MG capsule Take 300 mg by mouth every 8 (eight) hours.    . Fluticasone Furoate-Vilanterol 100-25 MCG/INH AEPB Inhale 1 puff into the lungs daily.     . furosemide (LASIX) 20 MG tablet Take 20 mg by mouth daily.     . hydrochlorothiazide (HYDRODIURIL) 25 MG tablet Take 25 mg by mouth daily.    Marland Kitchen ipratropium-albuterol (DUONEB) 0.5-2.5 (3) MG/3ML SOLN Take 3 mLs by nebulization every 6 (six) hours as needed (for shortness of breath).     . Magnesium 250 MG TABS Take 1 tablet by mouth daily.     Marland Kitchen olmesartan (BENICAR) 20 MG tablet Take 20 mg by mouth daily.    . pregabalin (LYRICA) 150 MG capsule Take 150 mg by mouth 2 (two) times daily.    . propranolol (INDERAL) 80 MG tablet Take 80 mg by mouth daily.     Marland Kitchen rOPINIRole (REQUIP) 0.25 MG tablet Take 0.25 mg by mouth 3 (three) times daily.    Marland Kitchen tiotropium (SPIRIVA) 18 MCG inhalation capsule Place 18 mcg into inhaler and inhale daily.    . cephALEXin (KEFLEX) 500 MG capsule Take 1 capsule (500 mg total) by mouth 4 (four) times daily. (Patient not taking: Reported on 08/18/2014) 28 capsule 0  . Chlorpheniramine-Acetaminophen (CORICIDIN HBP COLD/FLU PO) Take 2 tablets by mouth daily as  needed (for nasal congestion).    . penicillin v potassium (VEETID) 500 MG tablet Take 1 tablet (500 mg total) by mouth 3 (three) times daily. (Patient not taking: Reported on 09/16/2014) 15 tablet 0   No current facility-administered medications for this visit.    Functional Status:   In your present state of health, do you have any difficulty performing the following activities: 07/06/2014  Hearing? N  Vision? N  Difficulty concentrating or making decisions? N  Walking or climbing stairs? Y  Dressing or bathing? N  Doing errands, shopping? Y  Preparing Food and eating ? Y  Using the Toilet? Y  In the past six months, have you accidently leaked urine? Y  Do you have problems with loss of bowel control? N  Managing your Medications? N  Managing your Finances? N   Housekeeping or managing your Housekeeping? Y    Fall/Depression Screening:    Fall screening done 7/13- no recent falls.   PHQ 2/9 Scores 07/06/2014  PHQ - 2 Score 6  PHQ- 9 Score 17    Assessment:   COPD-   Slight wheezing noted on expiration in left upper lobe anterior, bilateral upper lobes posterior.                                         Sob noted walking short distance- O2 sat checked while ambulating, dropped to 94% on O2                                          2.5 L [place].                             High BMI-  Provided pt with a scale, pt's weight today 359 lbs,  Wants to loose weight.                          Fall risk- pt's walker does not slide, pt needs to pick up to walk putting her at risk for fall.                          Diet:  Pt reported when eating sweets will not want anything else, need to increase intake of                                    Nutritional foods, limit sweets.   Plan:   Pt to weigh daily,record, work on goal of loosing one lb in the next 30 days.            Pt to limit sweets, increase protein/vegetables in diet.            Pt to call MD to see if sleep study can be done in the home.            RN CM called HH RN [facility]), discussed pt's need for a new walker to  which she will talk to                 [facility] PT- get back to RN CM.  RN CM to continue to provide pt with community RN CM services, next home visit 8/10.              RN CM to send [name] today's encounter as part of quarterly update.  [facility] CM Care Plan Problem One        Patient Outreach from 08/18/2014 in [place] CM Short Term Goal #1 (0-30 days)  patient to be followed by [place] program within 30 days   Interventions for Short Term Goal #1  referral completed and submitted for [place] Problem Two        Patient Outreach from 08/18/2014 in [place] for  Problem Two  Not Active    [facility] CM Care Plan Problem Three        Patient Outreach from 09/16/2014 in [place] Problem Three  COPD - activity intolerence    Care Plan for Problem Three  Active   [facility] Long Term Goal (31-90) days  Pt would see an improvement in activity, mobility improve in the next 60 days    [facility] Long Term Goal Start Date  09/16/14   Interventions for Problem Three Long Term Goal   Discussed with pt importance of sleep, thus need to f/u with MD about sleep study in order to get a new CPAP, diet change to help with weight loss, thus improve breathing.    [facility] CM Short Term Goal #2 Met Date  09/16/14   Interventions for Short Term Goal #2  provided pt with COPD action plan- yellow zone reviewed    [facility] CM Short Term Goal #3 (0-30 days)  Pt would increase her exercise, doing sit down exercises three times a week  in the next 30 days    [facility] CM Short Term Goal #3 Met Date  09/16/14   Interventions for Short Term Goal #3  Discussed with pt importance of exercise- could help improve lung function    [facility] CM Short Term Goal #4 (0-30 days)  pt would call MD about sleep study, be scheduled within the next 30 days    [facility] CM Short Term Goal #4 Start Date  09/16/14   Interventions for Short Term Goal #4  Discussed with importance of f/u with MD to schedule sleep study as was told would need before can obtain a new  CPAP, old one broken.    [facility] CM Short Term Goal #5 (0-30 days)  Pt would loose one pound in the next 30 days    [facility] CM Short Term Goal #5 Start Date  09/16/14   Interventions for Short Term Goal #5  RN CM provided pt with a scale, discussed  nutriitional food choices verses diet high in sweets (work against COPD)     Zara Chess.   [place] [facility]  [phone]

## 2014-09-18 ENCOUNTER — Telehealth: Payer: Self-pay | Admitting: Family Medicine

## 2014-09-18 NOTE — Telephone Encounter (Signed)
Loretta Wade from [facility] is requesting a verbal to add phyiscal therapy. Please return call [phone]

## 2014-09-18 NOTE — Telephone Encounter (Signed)
Patient already have appointment scheduled for aug 10

## 2014-09-18 NOTE — Telephone Encounter (Signed)
Marita Kansas from [place] care is requesting a verbal to add Physical therapy. Please return call [phone], routed call to [name].

## 2014-09-18 NOTE — Telephone Encounter (Signed)
Spoke with Marita Kansas from [employer] and okayed PT for balance and strength training for patient

## 2014-09-24 ENCOUNTER — Other Ambulatory Visit: Payer: Self-pay | Admitting: *Deleted

## 2014-09-24 ENCOUNTER — Encounter: Payer: Self-pay | Admitting: *Deleted

## 2014-09-24 NOTE — Patient Outreach (Signed)
[name]  09/24/2014  Loretta Wade [birth year] [id number]   Care coordination phone call from patient requesting financial assistance to obtain needed DME equipment.  All inclusive program re-visited.  Patient remains undecided regarding enrolling in this program due to the cost.  Supportive counseling provided regarding this issues.  Benefits of the program discussed.  Per patient, she agrees with the benefits, however, remains concerned in regards to the financial commitment.  Based on the fact that she does not have Medicaid, she would have to pay approximately $210.00 per month.  Plan:  CSW will follow up with patient within 2 days regarding DME equipment.   Loretta Wade [facility] Management [phone]

## 2014-09-29 ENCOUNTER — Other Ambulatory Visit: Payer: Self-pay | Admitting: *Deleted

## 2014-09-29 NOTE — Patient Outreach (Signed)
[facility]) Care Management  [facility] Social Work  09/29/2014  Loretta Wade [birth year] [id number]   Current Medications:  Current Outpatient Prescriptions  Medication Sig Dispense Refill  . albuterol (PROVENTIL HFA;VENTOLIN HFA) 108 (90 BASE) MCG/ACT inhaler Inhale 2 puffs into the lungs every 6 (six) hours as needed for wheezing or shortness of breath.     . cephALEXin (KEFLEX) 500 MG capsule Take 1 capsule (500 mg total) by mouth 4 (four) times daily. (Patient not taking: Reported on 08/18/2014) 28 capsule 0  . Chlorpheniramine-Acetaminophen (CORICIDIN HBP COLD/FLU PO) Take 2 tablets by mouth daily as needed (for nasal congestion).    . cholecalciferol (VITAMIN D) 1000 UNITS tablet Take 1,000 Units by mouth daily.     Marland Kitchen dexlansoprazole (DEXILANT) 60 MG capsule Take 60 mg by mouth daily.    Marland Kitchen etodolac (LODINE) 300 MG capsule Take 300 mg by mouth every 8 (eight) hours.    . Fluticasone Furoate-Vilanterol 100-25 MCG/INH AEPB Inhale 1 puff into the lungs daily.     . furosemide (LASIX) 20 MG tablet Take 20 mg by mouth daily.     . hydrochlorothiazide (HYDRODIURIL) 25 MG tablet Take 25 mg by mouth daily.    Marland Kitchen ipratropium-albuterol (DUONEB) 0.5-2.5 (3) MG/3ML SOLN Take 3 mLs by nebulization every 6 (six) hours as needed (for shortness of breath).     . Magnesium 250 MG TABS Take 1 tablet by mouth daily.     Marland Kitchen olmesartan (BENICAR) 20 MG tablet Take 20 mg by mouth daily.    . penicillin v potassium (VEETID) 500 MG tablet Take 1 tablet (500 mg total) by mouth 3 (three) times daily. (Patient not taking: Reported on 09/16/2014) 15 tablet 0  . pregabalin (LYRICA) 150 MG capsule Take 150 mg by mouth 2 (two) times daily.    . propranolol (INDERAL) 80 MG tablet Take 80 mg by mouth daily.     Marland Kitchen rOPINIRole (REQUIP) 0.25 MG tablet Take 0.25 mg by mouth 3 (three) times daily.    Marland Kitchen tiotropium (SPIRIVA) 18 MCG inhalation capsule Place 18 mcg into inhaler and inhale daily.     No current  facility-administered medications for this visit.    Functional Status:  In your present state of health, do you have any difficulty performing the following activities: 07/06/2014  Hearing? N  Vision? N  Difficulty concentrating or making decisions? N  Walking or climbing stairs? Y  Dressing or bathing? N  Doing errands, shopping? Y  Preparing Food and eating ? Y  Using the Toilet? Y  In the past six months, have you accidently leaked urine? Y  Do you have problems with loss of bowel control? N  Managing your Medications? N  Managing your Finances? N  Housekeeping or managing your Housekeeping? Y    Fall/Depression Screening:  PHQ 2/9 Scores 07/06/2014  PHQ - 2 Score 6  PHQ- 9 Score 17    Assessment:  Home visit to patient's home.  Patient's new walker and shower chair ordered and received last night. All inclusive program re-visited.  Patient continues to decline program due to financial limitations. This Education officer, museum  spoke with Scientist, research (medical) of the program to inquire about any possible financial assistance.  Per Butch Penny, no assistance available as per federal guidelines, the 200.00 is  patient monthly liability payment. However Butch Penny did explain that patient's co-pays, supplies and medications would be covered if she decided to participate.  Alternative's discussed for socialization.  Discussed possibility of the  [employer] discussed as an options.  Patient will think about it.    Plan: [employer]  [facility] has not contacted patient, referral form re-faxed. Information for the Lunch Program at the [place] to be mailed to patient by care management assistant. This Education officer, museum will follow up within 30 days.    Sheralyn Boatman [facility] Care Management [phone]

## 2014-09-30 ENCOUNTER — Other Ambulatory Visit: Payer: Self-pay | Admitting: *Deleted

## 2014-09-30 NOTE — Patient Outreach (Signed)
[facility]) Care Management  09/30/2014  FRANCINE HANNAN [birth year] [id number]   Phone call to the [facility] to discuss their lunch program.  Spoke with Jenny Reichmann who stated that no referral is necessary.  Lunch is provided Monday-Friday at 11:30am.  Most recipients use [facility] to get there.  She also stated that [facility], where patient lives also has the same meal program.  HIPPA compliant voicemail left for patient to return phone call regarding this option.  Sheralyn Boatman [facility] Care Management [phone]

## 2014-10-02 ENCOUNTER — Other Ambulatory Visit: Payer: Self-pay | Admitting: Family Medicine

## 2014-10-02 ENCOUNTER — Telehealth: Payer: Self-pay | Admitting: Family Medicine

## 2014-10-02 DIAGNOSIS — N3000 Acute cystitis without hematuria: Secondary | ICD-10-CM

## 2014-10-02 DIAGNOSIS — N390 Urinary tract infection, site not specified: Secondary | ICD-10-CM | POA: Insufficient documentation

## 2014-10-02 MED ORDER — NITROFURANTOIN MONOHYD MACRO 100 MG PO CAPS: 100.0000 mg | ORAL_CAPSULE | Freq: Two times a day (BID) | ORAL | Status: DC

## 2014-10-06 NOTE — Telephone Encounter (Signed)
ERRENOUS °

## 2014-10-08 ENCOUNTER — Encounter: Payer: Self-pay | Admitting: Family Medicine

## 2014-10-14 ENCOUNTER — Other Ambulatory Visit: Payer: Self-pay | Admitting: *Deleted

## 2014-10-14 VITALS — BP 134/74 | HR 69 | Resp 20 | Ht 65.0 in | Wt 358.0 lb

## 2014-10-14 DIAGNOSIS — I509 Heart failure, unspecified: Secondary | ICD-10-CM

## 2014-10-14 NOTE — Patient Outreach (Signed)
[facility]) Care Management  [facility] Social Work  10/14/2014  Loretta Wade 03-Feb-1951 [id number]  Subjective:    Objective:   Current Medications:  Current Outpatient Prescriptions  Medication Sig Dispense Refill  . acetaminophen (ACETAMINOPHEN EXTRA STRENGTH) 500 MG tablet Take 500 mg by mouth every 6 (six) hours as needed.    Marland Kitchen albuterol (PROVENTIL HFA;VENTOLIN HFA) 108 (90 BASE) MCG/ACT inhaler Inhale 2 puffs into the lungs every 6 (six) hours as needed for wheezing or shortness of breath.     . cephALEXin (KEFLEX) 500 MG capsule Take 1 capsule (500 mg total) by mouth 4 (four) times daily. (Patient not taking: Reported on 08/18/2014) 28 capsule 0  . Chlorpheniramine-Acetaminophen (CORICIDIN HBP COLD/FLU PO) Take 2 tablets by mouth daily as needed (for nasal congestion).    . cholecalciferol (VITAMIN D) 1000 UNITS tablet Take 1,000 Units by mouth daily.     Marland Kitchen dexlansoprazole (DEXILANT) 60 MG capsule Take 60 mg by mouth daily.    Marland Kitchen etodolac (LODINE) 300 MG capsule Take 300 mg by mouth every 8 (eight) hours.    . Fluticasone Furoate-Vilanterol 100-25 MCG/INH AEPB Inhale 1 puff into the lungs daily.     . furosemide (LASIX) 20 MG tablet Take 20 mg by mouth daily.     . hydrochlorothiazide (HYDRODIURIL) 25 MG tablet Take 25 mg by mouth daily.    Marland Kitchen ipratropium-albuterol (DUONEB) 0.5-2.5 (3) MG/3ML SOLN Take 3 mLs by nebulization every 6 (six) hours as needed (for shortness of breath).     . Magnesium 250 MG TABS Take 1 tablet by mouth daily.     . nitrofurantoin, macrocrystal-monohydrate, (MACROBID) 100 MG capsule Take 1 capsule (100 mg total) by mouth 2 (two) times daily. (Patient not taking: Reported on 10/14/2014) 14 capsule 0  . olmesartan (BENICAR) 20 MG tablet Take 20 mg by mouth daily.    . penicillin v potassium (VEETID) 500 MG tablet Take 1 tablet (500 mg total) by mouth 3 (three) times daily. (Patient not taking: Reported on 09/16/2014) 15 tablet 0  .  pregabalin (LYRICA) 150 MG capsule Take 150 mg by mouth 2 (two) times daily.    . propranolol (INDERAL) 80 MG tablet Take 80 mg by mouth daily.     Marland Kitchen rOPINIRole (REQUIP) 0.25 MG tablet Take 0.25 mg by mouth 3 (three) times daily.    Marland Kitchen tiotropium (SPIRIVA) 18 MCG inhalation capsule Place 18 mcg into inhaler and inhale daily.     No current facility-administered medications for this visit.    Functional Status:  In your present state of health, do you have any difficulty performing the following activities: 07/06/2014  Hearing? N  Vision? N  Difficulty concentrating or making decisions? N  Walking or climbing stairs? Y  Dressing or bathing? N  Doing errands, shopping? Y  Preparing Food and eating ? Y  Using the Toilet? Y  In the past six months, have you accidently leaked urine? Y  Do you have problems with loss of bowel control? N  Managing your Medications? N  Managing your Finances? N  Housekeeping or managing your Housekeeping? Y    Fall/Depression Screening:  PHQ 2/9 Scores 07/06/2014  PHQ - 2 Score 6  PHQ- 9 Score 17   3 times Assessment: Home visit to patient's home. Patient confirms receiving her  walker and 3 in 1 shower chair to increase safety.  Per patient , she had not received a call from [facility].  Phone call made to  [employer] [facility].  It was confirmed that patient had been reached out to 3 times.  Her final call will be 10/15/14.  Patient informed to expect a call from this provider.  Increasing patient's mobility and socialization explored.  Patient has a congregate meal program in her building.  This Education officer, museum given verbal persmission to speak to Georgeanne Nim, resident coordinator regarding this program.  Per Junius Creamer, she is very concerned about patient's isolation and refusal to be more active in the activities provided there despite frequent encouragement. The menu and schedule provided for patient, however patient will have to walk down in  person to sign up for the program and for the meals.  Patient declined this program stating that she did not feel good enough to go down there.  Patient continues to discuss several reasons why she could not attend this program despite the offer of having someone assist her to the area where they have the meals and activities.    Emotional support provided, options explored  Patient continues to refuse the all inclusive program([facility]) , congregate lunch program and other activities provided by the facility where she lives.  Per patient, her son and grandson visit regularly and are supportive. Plan:  Patient case to be closed to social work at this time.     Sheralyn Boatman [facility] Care Management [phone]

## 2014-10-14 NOTE — Patient Outreach (Signed)
[facility]) Care Management   10/14/2014  Loretta Wade 1951-02-01 [id number]  Loretta Wade is an 64 y.o. female  Subjective:  Co visit done with Loretta Wade.   Pt states she did receive her walker and 3 in 1 chair, not assembled yet.  Pt states HH PT came twice, worked with her.  Pt states HH RN still coming.  Pt states she is to see  Dr. Manuella Wade 8/25 will talk to him then about in home sleep study.  Pt states she has been weighing daily, was recording but misplaced the paper.  Pt states the last 3 days- weight was 358 lbs.- no weight loss. Pt states she is getting up more.    Objective:   Filed Vitals:   10/14/14 1030  BP: 134/74  Pulse: 69  Resp: 20    ROS  Physical Exam  Constitutional: She is oriented to person, place, and time. She appears well-nourished.  Cardiovascular: Normal rate and regular rhythm.   Respiratory: Breath sounds normal.  On O2 2 LNC   GI: Soft.  Neurological: She is alert and oriented to person, place, and time.  Skin: Skin is warm and dry.  Psychiatric: She has a normal mood and affect. Her behavior is normal. Judgment and thought content normal.    Current Medications:  Medications reviewed with pt.  Current Outpatient Prescriptions  Medication Sig Dispense Refill  . acetaminophen (ACETAMINOPHEN EXTRA STRENGTH) 500 MG tablet Take 500 mg by mouth every 6 (six) hours as needed.    . Chlorpheniramine-Acetaminophen (CORICIDIN HBP COLD/FLU PO) Take 2 tablets by mouth daily as needed (for nasal congestion).    . cholecalciferol (VITAMIN D) 1000 UNITS tablet Take 1,000 Units by mouth daily.     Marland Kitchen dexlansoprazole (DEXILANT) 60 MG capsule Take 60 mg by mouth daily.    Marland Kitchen etodolac (LODINE) 300 MG capsule Take 300 mg by mouth every 8 (eight) hours.    . Fluticasone Furoate-Vilanterol 100-25 MCG/INH AEPB Inhale 1 puff into the lungs daily.     . furosemide (LASIX) 20 MG tablet Take 20 mg by mouth daily.     . hydrochlorothiazide  (HYDRODIURIL) 25 MG tablet Take 25 mg by mouth daily.    Marland Kitchen ipratropium-albuterol (DUONEB) 0.5-2.5 (3) MG/3ML SOLN Take 3 mLs by nebulization every 6 (six) hours as needed (for shortness of breath).     . Magnesium 250 MG TABS Take 1 tablet by mouth daily.     Marland Kitchen olmesartan (BENICAR) 20 MG tablet Take 20 mg by mouth daily.    . pregabalin (LYRICA) 150 MG capsule Take 150 mg by mouth 2 (two) times daily.    . propranolol (INDERAL) 80 MG tablet Take 80 mg by mouth daily.     Marland Kitchen rOPINIRole (REQUIP) 0.25 MG tablet Take 0.25 mg by mouth 3 (three) times daily.    Marland Kitchen tiotropium (SPIRIVA) 18 MCG inhalation capsule Place 18 mcg into inhaler and inhale daily.    Marland Kitchen albuterol (PROVENTIL HFA;VENTOLIN HFA) 108 (90 BASE) MCG/ACT inhaler Inhale 2 puffs into the lungs every 6 (six) hours as needed for wheezing or shortness of breath.     . cephALEXin (KEFLEX) 500 MG capsule Take 1 capsule (500 mg total) by mouth 4 (four) times daily. (Patient not taking: Reported on 08/18/2014) 28 capsule 0  . nitrofurantoin, macrocrystal-monohydrate, (MACROBID) 100 MG capsule Take 1 capsule (100 mg total) by mouth 2 (two) times daily. (Patient not taking: Reported on 10/14/2014) 14 capsule 0  .  penicillin v potassium (VEETID) 500 MG tablet Take 1 tablet (500 mg total) by mouth 3 (three) times daily. (Patient not taking: Reported on 09/16/2014) 15 tablet 0   No current facility-administered medications for this visit.    Functional Status:   In your present state of health, do you have any difficulty performing the following activities: 07/06/2014  Hearing? N  Vision? N  Difficulty concentrating or making decisions? N  Walking or climbing stairs? Y  Dressing or bathing? N  Doing errands, shopping? Y  Preparing Food and eating ? Y  Using the Toilet? Y  In the past six months, have you accidently leaked urine? Y  Do you have problems with loss of bowel control? N  Managing your Medications? N  Managing your Finances? N   Housekeeping or managing your Housekeeping? Y    Fall/Depression Screening:    PHQ 2/9 Scores 07/06/2014  PHQ - 2 Score 6  PHQ- 9 Score 17    Assessment:  COPD-  No change in sob (with exertion, walking short distances).  O2 sat on 2L [place]- 95 %.   Lungs clear.    Discussed with pt benefit of weight loss, nutritional meals - help with management of COPD                          Sleep apnea-  Pt reports not sleeping, need for new CPAP, sleep study required.   Plan:  Pt to continue to weigh daily/record.            Pt to loose one pound by next home visit.             Pt to f/u with Dr. Manuella Wade 8/25- discuss scheduling home sleep study.             RN CM to continue to provide community nurse case management services, next home visit  9/12.    [facility] CM Care Plan Problem One        Patient Outreach Telephone from 09/24/2014 in [place] Problem One  depression/isolaiton   Care Plan for Problem One  Active   Interventions for Short Term Goal #1  goal not met, phone call to [place] referral not found will need to be refaxed    [place] Problem Two        Patient Outreach from 08/18/2014 in [place] for Problem Two  Not Active    [facility] CM Care Plan Problem Three        Patient Outreach from 10/14/2014 in [place] Problem Three  COPD - activity intolerence    Care Plan for Problem Three  Active   [facility] Long Term Goal (31-90) days  Pt would see an improvement in activity, mobility improve in the next 60 days    Interventions for Problem Three Long Term Goal  discussed with pt increasing activity will help improve lung function.    [facility] CM Short Term Goal #4 (0-30 days)  Pt would  talk to MD about sleep study(to see 8/25), schedule study within next 30 days    [facility] CM Short Term Goal #4 Start Date  10/14/14   Interventions for Short Term Goal #4  Reviewed with pt importance of talking to MD about in  home sleep study- need to get new CPAP   [facility] CM Short Term Goal #  5 (0-30 days)  Pt would loose one pound in the next 30 days    [facility] CM Short Term Goal #5 Start Date  10/14/14   Interventions for Short Term Goal #5  Discussed with pt to continue to weigh daily, increase exercise.       Zara Chess.   [place] Care Management  [phone]

## 2014-10-15 ENCOUNTER — Other Ambulatory Visit: Payer: Self-pay | Admitting: *Deleted

## 2014-10-15 NOTE — Patient Outreach (Addendum)
[place] [facility]) Care Management  10/15/2014  Harrie Foreman. 09-06-1950 [id number]   Phone call from [place] from [place] ext [phone].  Per Enid Derry, she has not been able to reach patient.  Phoned her today and had to leave a message.  Her goal is to contact patient 1 x per week if she agrees and also to refer her toam  in network outpatient provider.  Patient's immobility and isolation discussed.  Per Enid Derry, she will follow up with this Education officer, museum with outcome of attempts made to make contact.   Sheralyn Boatman [facility] Care Management [phone]'

## 2014-10-20 ENCOUNTER — Other Ambulatory Visit: Payer: Self-pay | Admitting: *Deleted

## 2014-10-20 NOTE — Patient Outreach (Signed)
[place] [facility]) Care Management  10/20/2014  LEEANA CREER 05/18/1950 [id number]   Phone call from [place]([employer] program) stating that she will be closing patient's case after several attempts to reach out to patient with no response.    Sheralyn Boatman [facility] Care Management [phone]

## 2014-10-22 ENCOUNTER — Other Ambulatory Visit: Payer: Self-pay | Admitting: Family Medicine

## 2014-10-22 DIAGNOSIS — G2581 Restless legs syndrome: Secondary | ICD-10-CM

## 2014-10-23 ENCOUNTER — Other Ambulatory Visit: Payer: Self-pay | Admitting: Family Medicine

## 2014-10-26 ENCOUNTER — Other Ambulatory Visit: Payer: Self-pay | Admitting: Family Medicine

## 2014-10-26 DIAGNOSIS — I10 Essential (primary) hypertension: Secondary | ICD-10-CM

## 2014-10-29 ENCOUNTER — Ambulatory Visit: Payer: Self-pay | Admitting: Family Medicine

## 2014-11-04 ENCOUNTER — Inpatient Hospital Stay
Admission: EM | Admit: 2014-11-04 | Discharge: 2014-11-08 | DRG: 190 | Disposition: A | Discharge status: Home Health | Payer: Medicare PPO | Attending: Internal Medicine | Admitting: Internal Medicine

## 2014-11-04 ENCOUNTER — Encounter: Payer: Self-pay | Admitting: Emergency Medicine

## 2014-11-04 ENCOUNTER — Other Ambulatory Visit: Payer: Self-pay

## 2014-11-04 ENCOUNTER — Emergency Department: Payer: Medicare PPO

## 2014-11-04 DIAGNOSIS — Z9981 Dependence on supplemental oxygen: Secondary | ICD-10-CM

## 2014-11-04 DIAGNOSIS — M797 Fibromyalgia: Secondary | ICD-10-CM | POA: Diagnosis present

## 2014-11-04 DIAGNOSIS — I5033 Acute on chronic diastolic (congestive) heart failure: Secondary | ICD-10-CM | POA: Diagnosis present

## 2014-11-04 DIAGNOSIS — Z79899 Other long term (current) drug therapy: Secondary | ICD-10-CM | POA: Diagnosis not present

## 2014-11-04 DIAGNOSIS — J441 Chronic obstructive pulmonary disease with (acute) exacerbation: Secondary | ICD-10-CM | POA: Diagnosis present

## 2014-11-04 DIAGNOSIS — Z90710 Acquired absence of both cervix and uterus: Secondary | ICD-10-CM | POA: Diagnosis not present

## 2014-11-04 DIAGNOSIS — R0602 Shortness of breath: Secondary | ICD-10-CM | POA: Diagnosis not present

## 2014-11-04 DIAGNOSIS — Z87891 Personal history of nicotine dependence: Secondary | ICD-10-CM

## 2014-11-04 DIAGNOSIS — G4730 Sleep apnea, unspecified: Secondary | ICD-10-CM | POA: Diagnosis present

## 2014-11-04 DIAGNOSIS — Z96651 Presence of right artificial knee joint: Secondary | ICD-10-CM | POA: Diagnosis present

## 2014-11-04 DIAGNOSIS — M1990 Unspecified osteoarthritis, unspecified site: Secondary | ICD-10-CM | POA: Diagnosis present

## 2014-11-04 DIAGNOSIS — J9620 Acute and chronic respiratory failure, unspecified whether with hypoxia or hypercapnia: Secondary | ICD-10-CM | POA: Diagnosis present

## 2014-11-04 DIAGNOSIS — G2581 Restless legs syndrome: Secondary | ICD-10-CM | POA: Diagnosis present

## 2014-11-04 DIAGNOSIS — Z6841 Body Mass Index (BMI) 40.0 and over, adult: Secondary | ICD-10-CM | POA: Diagnosis not present

## 2014-11-04 DIAGNOSIS — I10 Essential (primary) hypertension: Secondary | ICD-10-CM | POA: Diagnosis present

## 2014-11-04 DIAGNOSIS — G894 Chronic pain syndrome: Secondary | ICD-10-CM | POA: Diagnosis present

## 2014-11-04 DIAGNOSIS — Z8249 Family history of ischemic heart disease and other diseases of the circulatory system: Secondary | ICD-10-CM

## 2014-11-04 DIAGNOSIS — Z833 Family history of diabetes mellitus: Secondary | ICD-10-CM | POA: Diagnosis not present

## 2014-11-04 DIAGNOSIS — Z9049 Acquired absence of other specified parts of digestive tract: Secondary | ICD-10-CM | POA: Diagnosis present

## 2014-11-04 DIAGNOSIS — J449 Chronic obstructive pulmonary disease, unspecified: Secondary | ICD-10-CM

## 2014-11-04 DIAGNOSIS — K219 Gastro-esophageal reflux disease without esophagitis: Secondary | ICD-10-CM | POA: Diagnosis present

## 2014-11-04 DIAGNOSIS — Z803 Family history of malignant neoplasm of breast: Secondary | ICD-10-CM | POA: Diagnosis not present

## 2014-11-04 LAB — BASIC METABOLIC PANEL
ANION GAP: 7 (ref 5–15)
BUN: 11 mg/dL (ref 6–20)
CALCIUM: 9.4 mg/dL (ref 8.9–10.3)
CHLORIDE: 105 mmol/L (ref 101–111)
CO2: 31 mmol/L (ref 22–32)
CREATININE: 0.82 mg/dL (ref 0.44–1.00)
GFR calc non Af Amer: >60 mL/min (ref >60)
Glucose, Bld: 111 mg/dL — ABNORMAL HIGH (ref 65–99)
Potassium: 4 mmol/L (ref 3.5–5.1)
SODIUM: 143 mmol/L (ref 135–145)

## 2014-11-04 LAB — CBC
HCT: 36.9 % (ref 35.0–47.0)
Hemoglobin: 11.7 g/dL — ABNORMAL LOW (ref 12.0–16.0)
MCH: 29.9 pg (ref 26.0–34.0)
MCHC: 31.8 g/dL — AB (ref 32.0–36.0)
MCV: 94.2 fL (ref 80.0–100.0)
PLATELETS: 234 10*3/uL (ref 150–440)
RBC: 3.92 MIL/uL (ref 3.80–5.20)
RDW: 14.2 % (ref 11.5–14.5)
WBC: 4.5 10*3/uL (ref 3.6–11.0)

## 2014-11-04 LAB — BRAIN NATRIURETIC PEPTIDE: B NATRIURETIC PEPTIDE 5: 38 pg/mL (ref 0.0–100.0)

## 2014-11-04 LAB — TROPONIN I

## 2014-11-04 MED ORDER — IPRATROPIUM-ALBUTEROL 0.5-2.5 (3) MG/3ML IN SOLN
3.0000 mL | Freq: Once | RESPIRATORY_TRACT | Status: AC
  Administered 2014-11-04: 3 mL via RESPIRATORY_TRACT
  Filled 2014-11-04: qty 3

## 2014-11-04 MED ORDER — LEVOFLOXACIN IN D5W 750 MG/150ML IV SOLN
750.0000 mg | Freq: Once | INTRAVENOUS | Status: AC
  Administered 2014-11-04: 750 mg via INTRAVENOUS
  Filled 2014-11-04: qty 150

## 2014-11-04 MED ORDER — HYDROCHLOROTHIAZIDE 25 MG PO TABS
25.0000 mg | ORAL_TABLET | Freq: Every day | ORAL | Status: DC
  Administered 2014-11-04 – 2014-11-08 (×5): 25 mg via ORAL
  Filled 2014-11-04 (×4): qty 1

## 2014-11-04 MED ORDER — VITAMIN D 1000 UNITS PO TABS
1000.0000 [IU] | ORAL_TABLET | Freq: Every day | ORAL | Status: DC
  Administered 2014-11-05 – 2014-11-08 (×4): 1000 [IU] via ORAL
  Filled 2014-11-04 (×4): qty 1

## 2014-11-04 MED ORDER — IRBESARTAN 75 MG PO TABS
37.5000 mg | ORAL_TABLET | Freq: Every day | ORAL | Status: DC
  Administered 2014-11-05 – 2014-11-08 (×4): 37.5 mg via ORAL
  Filled 2014-11-04: qty 0.5
  Filled 2014-11-04 (×3): qty 1
  Filled 2014-11-04: qty 0.5

## 2014-11-04 MED ORDER — ENOXAPARIN SODIUM 40 MG/0.4ML ~~LOC~~ SOLN
40.0000 mg | Freq: Two times a day (BID) | SUBCUTANEOUS | Status: DC
  Administered 2014-11-04 – 2014-11-08 (×8): 40 mg via SUBCUTANEOUS
  Filled 2014-11-04 (×8): qty 0.4

## 2014-11-04 MED ORDER — METHYLPREDNISOLONE SODIUM SUCC 125 MG IJ SOLR
60.0000 mg | Freq: Four times a day (QID) | INTRAMUSCULAR | Status: DC
  Administered 2014-11-04 – 2014-11-05 (×3): 60 mg via INTRAVENOUS
  Filled 2014-11-04 (×3): qty 2

## 2014-11-04 MED ORDER — ALBUTEROL SULFATE (2.5 MG/3ML) 0.083% IN NEBU: 2.5000 mg | INHALATION_SOLUTION | Freq: Four times a day (QID) | RESPIRATORY_TRACT | Status: DC | PRN

## 2014-11-04 MED ORDER — GUAIFENESIN-DM 100-10 MG/5ML PO SYRP
10.0000 mL | ORAL_SOLUTION | ORAL | Status: DC | PRN
  Administered 2014-11-05: 01:00:00 10 mL via ORAL
  Filled 2014-11-04: qty 10

## 2014-11-04 MED ORDER — PANTOPRAZOLE SODIUM 40 MG PO TBEC
40.0000 mg | DELAYED_RELEASE_TABLET | Freq: Every day | ORAL | Status: DC
  Administered 2014-11-04 – 2014-11-07 (×4): 40 mg via ORAL
  Filled 2014-11-04 (×4): qty 1

## 2014-11-04 MED ORDER — METHYLPREDNISOLONE SODIUM SUCC 125 MG IJ SOLR
125.0000 mg | INTRAMUSCULAR | Status: AC
  Administered 2014-11-04: 125 mg via INTRAVENOUS
  Filled 2014-11-04: qty 2

## 2014-11-04 MED ORDER — INFLUENZA VAC SPLIT QUAD 0.5 ML IM SUSY
0.5000 mL | PREFILLED_SYRINGE | INTRAMUSCULAR | Status: AC
  Administered 2014-11-05: 11:00:00 0.5 mL via INTRAMUSCULAR
  Filled 2014-11-04: qty 0.5

## 2014-11-04 MED ORDER — PREGABALIN 75 MG PO CAPS
150.0000 mg | ORAL_CAPSULE | Freq: Two times a day (BID) | ORAL | Status: DC
  Administered 2014-11-04 – 2014-11-08 (×8): 150 mg via ORAL
  Filled 2014-11-04 (×8): qty 2

## 2014-11-04 MED ORDER — ROPINIROLE HCL 0.25 MG PO TABS
0.7500 mg | ORAL_TABLET | Freq: Every day | ORAL | Status: DC
  Administered 2014-11-04 – 2014-11-07 (×4): 0.75 mg via ORAL
  Filled 2014-11-04 (×4): qty 3

## 2014-11-04 MED ORDER — FUROSEMIDE 20 MG PO TABS
20.0000 mg | ORAL_TABLET | Freq: Every day | ORAL | Status: DC
  Administered 2014-11-05: 08:00:00 20 mg via ORAL
  Filled 2014-11-04: qty 1

## 2014-11-04 MED ORDER — TIOTROPIUM BROMIDE MONOHYDRATE 18 MCG IN CAPS
18.0000 ug | ORAL_CAPSULE | Freq: Every day | RESPIRATORY_TRACT | Status: DC
  Administered 2014-11-05 – 2014-11-08 (×4): 18 ug via RESPIRATORY_TRACT
  Filled 2014-11-04: qty 5

## 2014-11-04 MED ORDER — IPRATROPIUM-ALBUTEROL 0.5-2.5 (3) MG/3ML IN SOLN
3.0000 mL | RESPIRATORY_TRACT | Status: DC
  Administered 2014-11-04 – 2014-11-05 (×5): 3 mL via RESPIRATORY_TRACT
  Filled 2014-11-04 (×5): qty 3

## 2014-11-04 MED ORDER — BUDESONIDE 0.25 MG/2ML IN SUSP
0.2500 mg | Freq: Two times a day (BID) | RESPIRATORY_TRACT | Status: DC
  Administered 2014-11-05 – 2014-11-08 (×7): 0.25 mg via RESPIRATORY_TRACT
  Filled 2014-11-04 (×8): qty 2

## 2014-11-04 MED ORDER — LEVOFLOXACIN 500 MG PO TABS: 500.0000 mg | ORAL_TABLET | Freq: Every day | ORAL | Status: DC

## 2014-11-04 MED ORDER — PROPRANOLOL HCL ER 80 MG PO CP24
80.0000 mg | ORAL_CAPSULE | Freq: Every day | ORAL | Status: DC
  Administered 2014-11-04 – 2014-11-08 (×5): 80 mg via ORAL
  Filled 2014-11-04 (×5): qty 1

## 2014-11-04 MED ORDER — MAGNESIUM OXIDE 400 (241.3 MG) MG PO TABS
400.0000 mg | ORAL_TABLET | Freq: Every day | ORAL | Status: DC
  Administered 2014-11-04 – 2014-11-08 (×5): 400 mg via ORAL
  Filled 2014-11-04 (×5): qty 1

## 2014-11-04 MED ORDER — LEVOFLOXACIN 500 MG PO TABS
500.0000 mg | ORAL_TABLET | Freq: Every day | ORAL | Status: DC
  Administered 2014-11-05: 500 mg via ORAL
  Filled 2014-11-04: qty 1

## 2014-11-04 NOTE — H&P (Signed)
[facility] NAME: Loretta Wade    MR#:  [id number]  DATE OF BIRTH:  [birth year]  DATE OF ADMISSION:  11/04/2014  PRIMARY CARE PHYSICIAN: Keith Rake, MD   REQUESTING/REFERRING PHYSICIAN: Quale  CHIEF COMPLAINT:   Chief Complaint  Patient presents with  . Shortness of Breath    HISTORY OF PRESENT ILLNESS: Loretta Wade  is a 64 y.o. female with a known history of hypertension, COPD, sleep apnea not using CPAP currently for more than a year, fibromyalgia, osteoarthritis, chronically on oxygen at home 2 L/m- had been feeling increasingly short of breath for last 1 week. She lives in assisted living facility and there is a visiting nurse over there she called her PMD and got a prescription of Z-Pak which she took for 5 days but it did not help much she also has a nebulizer machine at home which she continued using 3 times per day. Still she has complain of excessive shortness of breath on minimal exertion, dry cough, chest pain which is worse with deep breathing or coughing and so decided to come to emergency room. She is given some nebulizer treatments steroid injections and given to hospitalist team for admission for COPD.  PAST MEDICAL HISTORY:   Past Medical History  Diagnosis Date  . OA (osteoarthritis)   . Fibromyalgia   . Anemia   . Sleep apnea   . Fatigue   . Edema   . Esophageal reflux   . Nocturia   . Hypertension   . COPD (chronic obstructive pulmonary disease)     PAST SURGICAL HISTORY:  Past Surgical History  Procedure Laterality Date  . Cholecystectomy    . Total knee arthroplasty      right  . Vesicovaginal fistula closure w/ tah    . Abdominal hysterectomy      SOCIAL HISTORY:  Social History  Substance Use Topics  . Smoking status: Former Smoker    Quit date: 11/04/1983  . Smokeless tobacco: Not on file  . Alcohol Use: No    FAMILY HISTORY:  Family History  Problem Relation Age of  Onset  . Hypertension Mother   . Diabetes    . Breast cancer Daughter     DRUG ALLERGIES: No Known Allergies  REVIEW OF SYSTEMS:   CONSTITUTIONAL: No fever, fatigue or weakness.  EYES: No blurred or double vision.  EARS, NOSE, AND THROAT: No tinnitus or ear pain.  RESPIRATORY: positive for cough,  Have shortness of breath, mild  Wheezing, no hemoptysis.  CARDIOVASCULAR: No chest pain, orthopnea, edema.  GASTROINTESTINAL: No nausea, vomiting, diarrhea or abdominal pain.  GENITOURINARY: No dysuria, hematuria.  ENDOCRINE: No polyuria, nocturia,  HEMATOLOGY: No anemia, easy bruising or bleeding SKIN: No rash or lesion. MUSCULOSKELETAL: No joint pain or arthritis.   NEUROLOGIC: No tingling, numbness, weakness.  PSYCHIATRY: No anxiety or depression.   MEDICATIONS AT HOME:  Prior to Admission medications   Medication Sig Start Date End Date Taking? Authorizing Provider  acetaminophen (TYLENOL) 500 MG tablet Take 1,000 mg by mouth every 6 (six) hours as needed for mild pain or headache.   Yes Historical Provider, MD  albuterol (PROVENTIL HFA;VENTOLIN HFA) 108 (90 BASE) MCG/ACT inhaler Inhale 2 puffs into the lungs every 6 (six) hours as needed for wheezing or shortness of breath.    Yes Historical Provider, MD  Chlorpheniramine-Acetaminophen (CORICIDIN HBP COLD/FLU PO) Take 2 tablets by mouth daily as needed (for nasal congestion/cough).  Yes Historical Provider, MD  cholecalciferol (VITAMIN D) 1000 UNITS tablet Take 1,000 Units by mouth daily.    Yes Historical Provider, MD  dexlansoprazole (DEXILANT) 60 MG capsule Take 60 mg by mouth daily.   Yes Historical Provider, MD  etodolac (LODINE) 300 MG capsule Take 300 mg by mouth every 8 (eight) hours.   Yes Historical Provider, MD  Fluticasone Furoate-Vilanterol 100-25 MCG/INH AEPB Inhale 1 puff into the lungs 2 (two) times daily.    Yes Historical Provider, MD  furosemide (LASIX) 20 MG tablet Take 20 mg by mouth daily.    Yes Historical  Provider, MD  guaiFENesin-dextromethorphan (ROBITUSSIN DM) 100-10 MG/5ML syrup Take 10 mLs by mouth every 4 (four) hours as needed for cough.   Yes Historical Provider, MD  hydrochlorothiazide (HYDRODIURIL) 25 MG tablet Take 25 mg by mouth daily.   Yes Historical Provider, MD  ipratropium-albuterol (DUONEB) 0.5-2.5 (3) MG/3ML SOLN Take 3 mLs by nebulization every 6 (six) hours as needed (for shortness of breath).    Yes Historical Provider, MD  Magnesium 250 MG TABS Take 250 mg by mouth daily.    Yes Historical Provider, MD  olmesartan (BENICAR) 20 MG tablet Take 20 mg by mouth daily.   Yes Historical Provider, MD  pantoprazole (PROTONIX) 40 MG tablet Take 40 mg by mouth at bedtime.   Yes Historical Provider, MD  pregabalin (LYRICA) 150 MG capsule Take 150 mg by mouth 2 (two) times daily.   Yes Historical Provider, MD  propranolol ER (INDERAL LA) 80 MG 24 hr capsule Take 1 capsule (80 mg total) by mouth daily. 10/26/14  Yes Roselee Nova, MD  rOPINIRole (REQUIP) 0.25 MG tablet Take 3 tablets (0.75 mg total) by mouth at bedtime. 10/22/14  Yes Roselee Nova, MD  tiotropium (SPIRIVA) 18 MCG inhalation capsule Place 18 mcg into inhaler and inhale daily.   Yes Historical Provider, MD  cephALEXin (KEFLEX) 500 MG capsule Take 1 capsule (500 mg total) by mouth 4 (four) times daily. Patient not taking: Reported on 08/18/2014 07/08/14   Boris Lown, DO  nitrofurantoin, macrocrystal-monohydrate, (MACROBID) 100 MG capsule Take 1 capsule (100 mg total) by mouth 2 (two) times daily. Patient not taking: Reported on 10/14/2014 10/02/14   Roselee Nova, MD  penicillin v potassium (VEETID) 500 MG tablet Take 1 tablet (500 mg total) by mouth 3 (three) times daily. Patient not taking: Reported on 09/16/2014 09/01/14   Ahmed Prima, MD      PHYSICAL EXAMINATION:   VITAL SIGNS: Blood pressure 132/57, pulse 65, temperature 98.1 F (36.7 C), temperature source Oral, resp. rate 16, height 5\' 4"  (1.626 m),  weight 161.027 kg (355 lb), SpO2 86 %.  GENERAL:  64 y.o.-year-old morbidly obase patient lying in the bed with no acute distress.  EYES: Pupils equal, round, reactive to light and accommodation. No scleral icterus. Extraocular muscles intact.  HEENT: Head atraumatic, normocephalic. Oropharynx and nasopharynx clear.  NECK:  Supple, no jugular venous distention. No thyroid enlargement, no tenderness.  LUNGS: Normal breath sounds bilaterally, mild wheezing, no crepitation. No use of accessory muscles of respiration. Using nasal canula oxygen. CARDIOVASCULAR: S1, S2 normal. No murmurs, rubs, or gallops.  ABDOMEN: Soft, nontender, nondistended. Bowel sounds present. No organomegaly or mass.  EXTREMITIES: positive for chronic pedal edema, no cyanosis, or clubbing.  NEUROLOGIC: Cranial nerves II through XII are intact. Muscle strength 4/5 in all extremities. Sensation intact. Gait not checked.  PSYCHIATRIC: The patient is alert and oriented  x 3.  SKIN: No obvious rash, lesion, or ulcer.   LABORATORY PANEL:   CBC  Recent Labs Lab 11/04/14 1154  WBC 4.5  HGB 11.7*  HCT 36.9  PLT 234  MCV 94.2  MCH 29.9  MCHC 31.8*  RDW 14.2   ------------------------------------------------------------------------------------------------------------------  Chemistries   Recent Labs Lab 11/04/14 1154  NA 143  K 4.0  CL 105  CO2 31  GLUCOSE 111*  BUN 11  CREATININE 0.82  CALCIUM 9.4   ------------------------------------------------------------------------------------------------------------------ estimated creatinine clearance is 109.2 mL/min (by C-G formula based on Cr of 0.82). ------------------------------------------------------------------------------------------------------------------ No results for input(s): TSH, T4TOTAL, T3FREE, THYROIDAB in the last 72 hours.  Invalid input(s): FREET3   Coagulation profile No results for input(s): INR, PROTIME in the last 168  hours. ------------------------------------------------------------------------------------------------------------------- No results for input(s): DDIMER in the last 72 hours. -------------------------------------------------------------------------------------------------------------------  Cardiac Enzymes  Recent Labs Lab 11/04/14 1154  TROPONINI <0.03   ------------------------------------------------------------------------------------------------------------------ Invalid input(s): POCBNP  ---------------------------------------------------------------------------------------------------------------  Urinalysis    Component Value Date/Time   COLORURINE YELLOW* 08/23/2014 1446   COLORURINE Yellow 03/20/2014 1922   APPEARANCEUR CLEAR* 08/23/2014 1446   APPEARANCEUR Hazy 03/20/2014 1922   LABSPEC 1.013 08/23/2014 1446   LABSPEC 1.025 03/20/2014 1922   PHURINE 5.0 08/23/2014 1446   PHURINE 5.0 03/20/2014 1922   GLUCOSEU NEGATIVE 08/23/2014 1446   GLUCOSEU Negative 03/20/2014 1922   HGBUR NEGATIVE 08/23/2014 1446   HGBUR Negative 03/20/2014 1922   BILIRUBINUR NEGATIVE 08/23/2014 1446   BILIRUBINUR 1+ 03/20/2014 1922   KETONESUR NEGATIVE 08/23/2014 1446   KETONESUR Negative 03/20/2014 1922   PROTEINUR NEGATIVE 08/23/2014 1446   PROTEINUR 30 mg/dL 03/20/2014 1922   NITRITE NEGATIVE 08/23/2014 1446   NITRITE Negative 03/20/2014 1922   LEUKOCYTESUR 1+* 08/23/2014 1446   LEUKOCYTESUR Negative 03/20/2014 1922     RADIOLOGY: Dg Chest Port 1 View  11/04/2014   CLINICAL DATA:  Nonproductive cough with wheezing  EXAM: PORTABLE CHEST - 1 VIEW  COMPARISON:  Radiograph 09/01/2014  FINDINGS: Cardiac silhouette is mildly enlarged. There is central venous pulmonary congestion. Lung bases are poorly evaluated. No overt pulmonary edema.  IMPRESSION: Cardiomegaly and central venous congestion.   Electronically Signed   By: Suzy Bouchard M.D.   On: 11/04/2014 13:00     EKG: NSR  IMPRESSION AND PLAN:  * Acute COPD exacerbation IV steroid, DuoNeb nebulizer, Spiriva, inhaled steroid, Levaquin   * Chronic respiratory failure  Continue using oxygen while in house  * Sleep apnea  She is not using CPAP for more than a year now, she was referred by her PMD to have her repeat sleep study done to prescribe her CPAP in past but she never went for that. I encouraged her to follow up with PMD and this time not to missed appointments.  * Hypertension  Continue hydrochlorothiazide, Lasix, ARB.  * Fibromyalgia  Continue Lyrica, and Requip.  All the records are reviewed and case discussed with ED provider. Management plans discussed with the patient, family and they are in agreement.  CODE STATUS: full   TOTAL TIME TAKING CARE OF THIS PATIENT:50  minutes.    Vaughan Basta M.D on 11/04/2014   Between 7am to 6pm - Pager - [phone]  After 6pm go to [email or web address] - password [email or web address] [facility]  [place] [facility]  Office  [phone]  CC: Primary care physician; Keith Rake, MD

## 2014-11-04 NOTE — ED Provider Notes (Signed)
[facility] Emergency Department Provider Note  ____________________________________________  Time seen: Approximately 3:30 PM  I have reviewed the triage vital signs and the nursing notes.   HISTORY  Chief Complaint Shortness of Breath    HPI Loretta Wade is a 64 y.o. female history of severe obesity and COPD who presents today with shortness of breath. She reports increasing shortness breath for one week, nonproductive cough, feeling wheezing at times and having difficulty speaking  Denies any chest pain. No abdominal pain. No fever or chills.  Pertinent history includes history of COPD   Past Medical History  Diagnosis Date  . OA (osteoarthritis)   . Fibromyalgia   . Anemia   . Sleep apnea   . Fatigue   . Edema   . Esophageal reflux   . Nocturia   . Hypertension   . COPD (chronic obstructive pulmonary disease)     Patient Active Problem List   Diagnosis Date Noted  . Urinary tract infection 10/02/2014    Past Surgical History  Procedure Laterality Date  . Cholecystectomy    . Total knee arthroplasty      right  . Vesicovaginal fistula closure w/ tah    . Abdominal hysterectomy      Current Outpatient Rx  Name  Route  Sig  Dispense  Refill  . acetaminophen (TYLENOL) 500 MG tablet   Oral   Take 1,000 mg by mouth every 6 (six) hours as needed for mild pain or headache.         . albuterol (PROVENTIL HFA;VENTOLIN HFA) 108 (90 BASE) MCG/ACT inhaler   Inhalation   Inhale 2 puffs into the lungs every 6 (six) hours as needed for wheezing or shortness of breath.          . Chlorpheniramine-Acetaminophen (CORICIDIN HBP COLD/FLU PO)   Oral   Take 2 tablets by mouth daily as needed (for nasal congestion/cough).          . cholecalciferol (VITAMIN D) 1000 UNITS tablet   Oral   Take 1,000 Units by mouth daily.          Marland Kitchen dexlansoprazole (DEXILANT) 60 MG capsule   Oral   Take 60 mg by mouth daily.         Marland Kitchen etodolac  (LODINE) 300 MG capsule   Oral   Take 300 mg by mouth every 8 (eight) hours.         . Fluticasone Furoate-Vilanterol 100-25 MCG/INH AEPB   Inhalation   Inhale 1 puff into the lungs 2 (two) times daily.          . furosemide (LASIX) 20 MG tablet   Oral   Take 20 mg by mouth daily.          Marland Kitchen guaiFENesin-dextromethorphan (ROBITUSSIN DM) 100-10 MG/5ML syrup   Oral   Take 10 mLs by mouth every 4 (four) hours as needed for cough.         . hydrochlorothiazide (HYDRODIURIL) 25 MG tablet   Oral   Take 25 mg by mouth daily.         Marland Kitchen ipratropium-albuterol (DUONEB) 0.5-2.5 (3) MG/3ML SOLN   Nebulization   Take 3 mLs by nebulization every 6 (six) hours as needed (for shortness of breath).          . Magnesium 250 MG TABS   Oral   Take 250 mg by mouth daily.          Marland Kitchen olmesartan (BENICAR) 20 MG tablet  Oral   Take 20 mg by mouth daily.         . pantoprazole (PROTONIX) 40 MG tablet   Oral   Take 40 mg by mouth at bedtime.         . pregabalin (LYRICA) 150 MG capsule   Oral   Take 150 mg by mouth 2 (two) times daily.         . propranolol ER (INDERAL LA) 80 MG 24 hr capsule   Oral   Take 1 capsule (80 mg total) by mouth daily.   90 capsule   0   . rOPINIRole (REQUIP) 0.25 MG tablet   Oral   Take 3 tablets (0.75 mg total) by mouth at bedtime.   90 tablet   2   . tiotropium (SPIRIVA) 18 MCG inhalation capsule   Inhalation   Place 18 mcg into inhaler and inhale daily.         . cephALEXin (KEFLEX) 500 MG capsule   Oral   Take 1 capsule (500 mg total) by mouth 4 (four) times daily. Patient not taking: Reported on 08/18/2014   28 capsule   0   . nitrofurantoin, macrocrystal-monohydrate, (MACROBID) 100 MG capsule   Oral   Take 1 capsule (100 mg total) by mouth 2 (two) times daily. Patient not taking: Reported on 10/14/2014   14 capsule   0   . penicillin v potassium (VEETID) 500 MG tablet   Oral   Take 1 tablet (500 mg total) by mouth 3  (three) times daily. Patient not taking: Reported on 09/16/2014   15 tablet   0     Allergies Review of patient's allergies indicates no known allergies.  Family History  Problem Relation Age of Onset  . Hypertension Mother   . Diabetes    . Breast cancer Daughter     Social History Social History  Substance Use Topics  . Smoking status: Former Research scientist (life sciences)  . Smokeless tobacco: None  . Alcohol Use: No    Review of Systems Constitutional: No fever/chills Eyes: No visual changes. ENT: No sore throat. Cardiovascular: Denies chest pain. Respiratory: See history of present illness Gastrointestinal: No abdominal pain.  No nausea, no vomiting.  No diarrhea.  No constipation. Genitourinary: Negative for dysuria. Musculoskeletal: Negative for back pain. Skin: Negative for rash. Neurological: Negative for headaches, focal weakness or numbness.  10-point ROS otherwise negative.  ____________________________________________   PHYSICAL EXAM:  VITAL SIGNS: ED Triage Vitals  Enc Vitals Group     BP 11/04/14 1151 106/79 mmHg     Pulse Rate 11/04/14 1151 70     Resp 11/04/14 1151 18     Temp 11/04/14 1151 98.1 F (36.7 C)     Temp Source 11/04/14 1151 Oral     SpO2 11/04/14 1146 95 %     Weight 11/04/14 1151 355 lb (161.027 kg)     Height 11/04/14 1151 5\' 4"  (1.626 m)     Head Cir --      Peak Flow --      Pain Score 11/04/14 1152 8     Pain Loc --      Pain Edu? --      Excl. in [place]? --     Constitutional: Alert and oriented. Moderately ill-appearing, speaking in short phrases with mild accessory muscle use. Eyes: Conjunctivae are normal. PERRL. EOMI. Head: Atraumatic. Nose: No congestion/rhinnorhea. Mouth/Throat: Mucous membranes are moist.  Oropharynx non-erythematous. Neck: No stridor.   Cardiovascular: Normal rate, regular  rhythm. Grossly normal heart sounds.  Good peripheral circulation. Respiratory: Speaking in short phrases, mild increased respiratory rate and  accessory muscle use. There is diffuse and neck were wheezing throughout with occasional infrequent dry cough. Gastrointestinal: Soft and nontender. No distention. No abdominal bruits. No CVA tenderness. Musculoskeletal: No lower extremity tenderness nor edema.  No joint effusions. Neurologic:  Normal speech and language. No gross focal neurologic deficits are appreciated. No gait instability. Skin:  Skin is warm, dry and intact. No rash noted. Psychiatric: Mood and affect are normal. Speech and behavior are normal.  ____________________________________________   LABS (all labs ordered are listed, but only abnormal results are displayed)  Labs Reviewed  CBC - Abnormal; Notable for the following:    Hemoglobin 11.7 (*)    MCHC 31.8 (*)    All other components within normal limits  BASIC METABOLIC PANEL - Abnormal; Notable for the following:    Glucose, Bld 111 (*)    All other components within normal limits  TROPONIN I  BRAIN NATRIURETIC PEPTIDE   ____________________________________________  EKG  ED ECG REPORT I, Kentavious Michele, the attending physician, personally viewed and interpreted this ECG.  Date: 11/04/2014  Rate: 70  Rhythm: normal sinus rhythm QRS Axis: normal Intervals: normal ST/T Wave abnormalities: normal Conduction Disutrbances: none Narrative Interpretation: unremarkable  ____________________________________________  RADIOLOGY  DG Chest Port 1 View (Final result) Result time: 11/04/14 13:00:06   Final result by Rad Results In Interface (11/04/14 13:00:06)   Narrative:   CLINICAL DATA: Nonproductive cough with wheezing  EXAM: PORTABLE CHEST - 1 VIEW  COMPARISON: Radiograph 09/01/2014  FINDINGS: Cardiac silhouette is mildly enlarged. There is central venous pulmonary congestion. Lung bases are poorly evaluated. No overt pulmonary edema.  IMPRESSION: Cardiomegaly and central venous congestion.      ____________________________________________   PROCEDURES  Procedure(s) performed: None  Critical Care performed: No  ____________________________________________   INITIAL IMPRESSION / ASSESSMENT AND PLAN / ED COURSE  Pertinent labs & imaging results that were available during my care of the patient were reviewed by me and considered in my medical decision making (see chart for details).  Patient presents with shortness of breath, wheezing. History of COPD. No acute cardiac symptoms. EKG reassuring. She does demonstrate mild to moderate increased work of breathing and diffuse wheezing. In addition, the patient is a very obese I suspect this may also impede her respiratory drive somewhat.  After receiving treatments including do not and slight Medrol patient continues to speak in short phrases with accessory muscle use. Based on her ongoing work of breathing well with the patient to hospital for ongoing treatment what appears to be a COPD exacerbation. Levaquin as antibiotic. Ongoing care per hospitalist service Dr. Posey Pronto. ____________________________________________   FINAL CLINICAL IMPRESSION(S) / ED DIAGNOSES  Final diagnoses:  COPD with exacerbation      Delman Kitten, MD 11/04/14 [phone]

## 2014-11-04 NOTE — ED Notes (Signed)
Pt to ed with c/o SOB came to ed due to cough for about a week. Pt states her home health nurse has been giving her over the counter cough medication but it doesn't seem to help. Pt was given deuneb in route pt seems to be coughing more. Chest pain upon palpation, pt is tachypnic RR is about 33

## 2014-11-04 NOTE — Plan of Care (Signed)
Problem: Discharge Progression Outcomes Goal: Other Discharge Outcomes/Goals Outcome: Progressing Pt admitted for COPD exacerbation, she is a 64 year old female, morbidly obese. Pt is on chronic 02 at home. She was treated out pt with po Levaquin for a respiratory infection, but she states " I did not get better"  Goals of treatment  Decrease pt's dyspnea and give breathing treatments as ordered/tolerated Encourage OOB ambulation early to prevent further decreased air perfusion Offer incentive spirometer Do not increase 02 greater then  5 liters because pt is a COPD pt.

## 2014-11-04 NOTE — Progress Notes (Signed)
ANTICOAGULATION CONSULT NOTE - Initial Consult  Pharmacy Consult for Lovenox  Indication: VTE prophylaxis  No Known Allergies  Patient Measurements: Height: 5\' 4"  (162.6 cm) Weight: (!) 355 lb (161.027 kg) IBW/kg (Calculated) : 54.7 Heparin Dosing Weight:   Vital Signs: Temp: 98.3 F (36.8 C) (08/31 1728) Temp Source: Oral (08/31 1728) BP: 137/73 mmHg (08/31 1728) Pulse Rate: 73 (08/31 1728)  Labs:  Recent Labs  11/04/14 1154  HGB 11.7*  HCT 36.9  PLT 234  CREATININE 0.82  TROPONINI <0.03    Estimated Creatinine Clearance: 109.2 mL/min (by C-G formula based on Cr of 0.82).   Medical History: Past Medical History  Diagnosis Date  . OA (osteoarthritis)   . Fibromyalgia   . Anemia   . Sleep apnea   . Fatigue   . Edema   . Esophageal reflux   . Nocturia   . Hypertension   . COPD (chronic obstructive pulmonary disease)     Medications:  Prescriptions prior to admission  Medication Sig Dispense Refill Last Dose  . acetaminophen (TYLENOL) 500 MG tablet Take 1,000 mg by mouth every 6 (six) hours as needed for mild pain or headache.   Past Week at Unknown time  . albuterol (PROVENTIL HFA;VENTOLIN HFA) 108 (90 BASE) MCG/ACT inhaler Inhale 2 puffs into the lungs every 6 (six) hours as needed for wheezing or shortness of breath.    11/04/2014 at Unknown time  . Chlorpheniramine-Acetaminophen (CORICIDIN HBP COLD/FLU PO) Take 2 tablets by mouth daily as needed (for nasal congestion/cough).    11/03/2014 at 1000  . cholecalciferol (VITAMIN D) 1000 UNITS tablet Take 1,000 Units by mouth daily.    11/03/2014 at Unknown time  . dexlansoprazole (DEXILANT) 60 MG capsule Take 60 mg by mouth daily.   11/03/2014 at Unknown time  . etodolac (LODINE) 300 MG capsule Take 300 mg by mouth every 8 (eight) hours.   11/03/2014 at Unknown time  . Fluticasone Furoate-Vilanterol 100-25 MCG/INH AEPB Inhale 1 puff into the lungs 2 (two) times daily.    11/04/2014 at Unknown time  . furosemide  (LASIX) 20 MG tablet Take 20 mg by mouth daily.    11/03/2014 at Unknown time  . guaiFENesin-dextromethorphan (ROBITUSSIN DM) 100-10 MG/5ML syrup Take 10 mLs by mouth every 4 (four) hours as needed for cough.   11/03/2014 at 1000  . hydrochlorothiazide (HYDRODIURIL) 25 MG tablet Take 25 mg by mouth daily.   11/03/2014 at Unknown time  . ipratropium-albuterol (DUONEB) 0.5-2.5 (3) MG/3ML SOLN Take 3 mLs by nebulization every 6 (six) hours as needed (for shortness of breath).    11/04/2014 at Unknown time  . Magnesium 250 MG TABS Take 250 mg by mouth daily.    11/03/2014 at Unknown time  . olmesartan (BENICAR) 20 MG tablet Take 20 mg by mouth daily.   11/03/2014 at Unknown time  . pantoprazole (PROTONIX) 40 MG tablet Take 40 mg by mouth at bedtime.   11/03/2014 at Unknown time  . pregabalin (LYRICA) 150 MG capsule Take 150 mg by mouth 2 (two) times daily.   11/03/2014 at Unknown time  . propranolol ER (INDERAL LA) 80 MG 24 hr capsule Take 1 capsule (80 mg total) by mouth daily. 90 capsule 0 11/03/2014 at 1000  . rOPINIRole (REQUIP) 0.25 MG tablet Take 3 tablets (0.75 mg total) by mouth at bedtime. 90 tablet 2 11/03/2014 at Unknown time  . tiotropium (SPIRIVA) 18 MCG inhalation capsule Place 18 mcg into inhaler and inhale daily.   11/04/2014 at  Unknown time  . cephALEXin (KEFLEX) 500 MG capsule Take 1 capsule (500 mg total) by mouth 4 (four) times daily. (Patient not taking: Reported on 08/18/2014) 28 capsule 0 Not Taking  . nitrofurantoin, macrocrystal-monohydrate, (MACROBID) 100 MG capsule Take 1 capsule (100 mg total) by mouth 2 (two) times daily. (Patient not taking: Reported on 10/14/2014) 14 capsule 0 Not Taking  . penicillin v potassium (VEETID) 500 MG tablet Take 1 tablet (500 mg total) by mouth 3 (three) times daily. (Patient not taking: Reported on 09/16/2014) 15 tablet 0 Not Taking    Assessment: CrCl = 109.2 ml/min BMI = 59.7   Goal of Therapy:  DVT prophylaxis   Plan:  Lovenox 40 mg SQ Q24H  originally ordered.  Will adjust dose to lovenox 40 mg SQ Q12H based on BMI > 40.   Kirstie Larsen D 11/04/2014,5:59 PM

## 2014-11-05 ENCOUNTER — Inpatient Hospital Stay
Admit: 2014-11-05 | Discharge: 2014-11-05 | Disposition: A | Discharge status: Home / Self Care | Payer: Medicare PPO | Attending: Internal Medicine | Admitting: Internal Medicine

## 2014-11-05 DIAGNOSIS — J441 Chronic obstructive pulmonary disease with (acute) exacerbation: Secondary | ICD-10-CM | POA: Diagnosis not present

## 2014-11-05 LAB — BASIC METABOLIC PANEL
Anion gap: 8 (ref 5–15)
BUN: 11 mg/dL (ref 6–20)
CHLORIDE: 103 mmol/L (ref 101–111)
CO2: 30 mmol/L (ref 22–32)
Calcium: 9.7 mg/dL (ref 8.9–10.3)
Creatinine, Ser: 0.88 mg/dL (ref 0.44–1.00)
GFR calc Af Amer: >60 mL/min (ref >60)
GLUCOSE: 164 mg/dL — AB (ref 65–99)
POTASSIUM: 3.9 mmol/L (ref 3.5–5.1)
Sodium: 141 mmol/L (ref 135–145)

## 2014-11-05 LAB — CBC
HCT: 36.2 % (ref 35.0–47.0)
Hemoglobin: 11.8 g/dL — ABNORMAL LOW (ref 12.0–16.0)
MCH: 30.4 pg (ref 26.0–34.0)
MCHC: 32.5 g/dL (ref 32.0–36.0)
MCV: 93.5 fL (ref 80.0–100.0)
PLATELETS: 222 10*3/uL (ref 150–440)
RBC: 3.87 MIL/uL (ref 3.80–5.20)
RDW: 14 % (ref 11.5–14.5)
WBC: 4.6 10*3/uL (ref 3.6–11.0)

## 2014-11-05 MED ORDER — SODIUM CHLORIDE 0.9 % IJ SOLN
3.0000 mL | Freq: Two times a day (BID) | INTRAMUSCULAR | Status: DC
  Administered 2014-11-05 – 2014-11-08 (×6): 3 mL via INTRAVENOUS

## 2014-11-05 MED ORDER — ACETAMINOPHEN 325 MG PO TABS
650.0000 mg | ORAL_TABLET | Freq: Once | ORAL | Status: AC
  Administered 2014-11-05: 650 mg via ORAL
  Filled 2014-11-05: qty 2

## 2014-11-05 MED ORDER — ETODOLAC 300 MG PO CAPS
300.0000 mg | ORAL_CAPSULE | Freq: Three times a day (TID) | ORAL | Status: DC
  Filled 2014-11-05 (×3): qty 1

## 2014-11-05 MED ORDER — SODIUM CHLORIDE 0.9 % IJ SOLN
3.0000 mL | INTRAMUSCULAR | Status: DC | PRN
  Administered 2014-11-07 – 2014-11-08 (×3): 3 mL via INTRAVENOUS
  Filled 2014-11-05 (×3): qty 10

## 2014-11-05 MED ORDER — ALBUTEROL SULFATE (2.5 MG/3ML) 0.083% IN NEBU
2.5000 mg | INHALATION_SOLUTION | RESPIRATORY_TRACT | Status: DC
  Administered 2014-11-05 – 2014-11-08 (×16): 2.5 mg via RESPIRATORY_TRACT
  Filled 2014-11-05 (×17): qty 3

## 2014-11-05 MED ORDER — ETODOLAC 200 MG PO CAPS
400.0000 mg | ORAL_CAPSULE | Freq: Three times a day (TID) | ORAL | Status: DC
  Administered 2014-11-05 (×3): 400 mg via ORAL
  Filled 2014-11-05 (×7): qty 2

## 2014-11-05 MED ORDER — POTASSIUM CHLORIDE CRYS ER 20 MEQ PO TBCR
40.0000 meq | EXTENDED_RELEASE_TABLET | Freq: Every day | ORAL | Status: DC
  Administered 2014-11-05 – 2014-11-08 (×4): 40 meq via ORAL
  Filled 2014-11-05 (×4): qty 2

## 2014-11-05 MED ORDER — LEVOFLOXACIN 750 MG PO TABS
750.0000 mg | ORAL_TABLET | Freq: Every day | ORAL | Status: DC
  Administered 2014-11-06 – 2014-11-08 (×3): 750 mg via ORAL
  Filled 2014-11-05 (×3): qty 1

## 2014-11-05 MED ORDER — METHYLPREDNISOLONE SODIUM SUCC 125 MG IJ SOLR
60.0000 mg | Freq: Two times a day (BID) | INTRAMUSCULAR | Status: DC
  Administered 2014-11-05 – 2014-11-08 (×6): 60 mg via INTRAVENOUS
  Filled 2014-11-05 (×6): qty 2

## 2014-11-05 MED ORDER — FUROSEMIDE 10 MG/ML IJ SOLN
40.0000 mg | Freq: Two times a day (BID) | INTRAMUSCULAR | Status: DC
  Administered 2014-11-05 – 2014-11-07 (×4): 40 mg via INTRAVENOUS
  Filled 2014-11-05 (×4): qty 4

## 2014-11-05 NOTE — Progress Notes (Signed)
Dr, Reece Levy notified of patients complaint of a headache, tylenol 650 mg ordered x1 dose per MD.

## 2014-11-05 NOTE — Progress Notes (Signed)
Reports some intermittent chest wall type pain with cough/deep breaths/some movements. Repositioned. Denies further interventions at this time. No acute distress.

## 2014-11-05 NOTE — Plan of Care (Signed)
Problem: Discharge Progression Outcomes Goal: Other Discharge Outcomes/Goals Outcome: Progressing Discharge Outcomes; Progress toward Goal: Patient has continued with SOB but states has been better today. SOB continues with exertion but withstands up out of bed to bedside commode with one assist without difficulty. Has non-productive cough with expiratory wheezes. Received SVN's today with some relief of SOB. Eating fair but states not much appetite. Had refused SCD's but decided to have them placed on legs and tolerating well. Complaints of headache and back pain from coughing. Notified MD and received order for scheduled Etodolac that has offered relief.

## 2014-11-05 NOTE — Progress Notes (Signed)
[facility] at [place] NAME: Loretta Wade    MR#:  [id number]  DATE OF BIRTH:  1950/12/23  SUBJECTIVE:  CHIEF COMPLAINT:   Chief Complaint  Patient presents with  . Shortness of Breath   shortness of breath is the same. Cough present without sputum. No chest pain. Has orthopnea and some lower extremity edema.  REVIEW OF SYSTEMS:    Review of Systems  Constitutional: Positive for malaise/fatigue. Negative for fever and chills.  HENT: Negative for sore throat.   Eyes: Negative for blurred vision, double vision and pain.  Respiratory: Positive for cough, shortness of breath and wheezing. Negative for hemoptysis and sputum production.   Cardiovascular: Negative for chest pain, palpitations, orthopnea and leg swelling.  Gastrointestinal: Negative for heartburn, nausea, vomiting, abdominal pain, diarrhea and constipation.  Genitourinary: Negative for dysuria and hematuria.  Musculoskeletal: Negative for back pain and joint pain.  Skin: Negative for rash.  Neurological: Positive for weakness. Negative for sensory change, speech change, focal weakness and headaches.  Endo/Heme/Allergies: Does not bruise/bleed easily.  Psychiatric/Behavioral: Negative for depression. The patient is not nervous/anxious.       DRUG ALLERGIES:  No Known Allergies  VITALS:  Blood pressure 132/72, pulse 88, temperature 98.2 F (36.8 C), temperature source Oral, resp. rate 16, height 5\' 4"  (1.626 m), weight 161.027 kg (355 lb), SpO2 95 %.  PHYSICAL EXAMINATION:   Physical Exam  GENERAL:  64 y.o.-year-old patient lying in the bed with no acute distress.  EYES: Pupils equal, round, reactive to light and accommodation. No scleral icterus. Extraocular muscles intact.  HEENT: Head atraumatic, normocephalic. Oropharynx and nasopharynx clear.  NECK:  Supple, no jugular venous distention. No thyroid enlargement, no tenderness.  LUNGS: Increased work of  breathing. Bilateral wheezing and decreased air entry. CARDIOVASCULAR: S1, S2 normal. No murmurs, rubs, or gallops.  ABDOMEN: Soft, nontender, nondistended. Bowel sounds present. No organomegaly or mass.  EXTREMITIES: No cyanosis, clubbing. 1+ edema NEUROLOGIC: Cranial nerves II through XII are intact. No focal Motor or sensory deficits b/l.   PSYCHIATRIC: The patient is alert and oriented x 3.  SKIN: No obvious rash, lesion, or ulcer.    LABORATORY PANEL:   CBC  Recent Labs Lab 11/05/14 0526  WBC 4.6  HGB 11.8*  HCT 36.2  PLT 222   ------------------------------------------------------------------------------------------------------------------  Chemistries   Recent Labs Lab 11/05/14 0526  NA 141  K 3.9  CL 103  CO2 30  GLUCOSE 164*  BUN 11  CREATININE 0.88  CALCIUM 9.7   ------------------------------------------------------------------------------------------------------------------  Cardiac Enzymes  Recent Labs Lab 11/04/14 1154  TROPONINI <0.03   ------------------------------------------------------------------------------------------------------------------  RADIOLOGY:  Dg Chest Port 1 View  11/04/2014   CLINICAL DATA:  Nonproductive cough with wheezing  EXAM: PORTABLE CHEST - 1 VIEW  COMPARISON:  Radiograph 09/01/2014  FINDINGS: Cardiac silhouette is mildly enlarged. There is central venous pulmonary congestion. Lung bases are poorly evaluated. No overt pulmonary edema.  IMPRESSION: Cardiomegaly and central venous congestion.   Electronically Signed   By: Suzy Bouchard M.D.   On: 11/04/2014 13:00     ASSESSMENT AND PLAN:   * Acute on chronic respiratory failure Due to COPD exacerbation and acute on chronic diastolic congestive heart failure. Wean oxygen as tolerated.  * COPD exacerbation -IV steroids, Antibiotics - Scheduled Nebulizers - Inhalers -Wean O2 as tolerated - Consult pulmonary if no improvement  * Acute on chronic diastolic  congestive heart failure - IV Lasix, Beta blockers - Input  and Output - Monitor Bun/Cr and Potassium - Echo -Cardiology follow up after discharge  * Chronic pain syndrome Continue medications from home  * Morbid obesity with sleep apnea  * DVT prophylaxis with Lovenox   All the records are reviewed and case discussed with Care Management/Social Workerr. Management plans discussed with the patient, family and they are in agreement.  CODE STATUS: Full code  DVT Prophylaxis: SCDs  TOTAL TIME TAKING CARE OF THIS PATIENT: 35 minutes.   POSSIBLE D/C IN 1-2 DAYS, DEPENDING ON CLINICAL CONDITION.   Hillary Bow R M.D on 11/05/2014 at 11:42 AM  Between 7am to 6pm - Pager - [phone]  After 6pm go to [email or web address] - password [email or web address] [facility]  [facility]  Office  [phone]  CC: Primary care physician; Keith Rake, MD

## 2014-11-05 NOTE — Plan of Care (Signed)
Problem: Discharge Progression Outcomes Goal: Other Discharge Outcomes/Goals Plan of care progress to goal: - No complaints of pain. - Continues on 02 at 2L per [place]. - Continues breathing tx. - PRN cough medication given for cough with improvement.

## 2014-11-06 MED ORDER — ETODOLAC 400 MG PO TABS
400.0000 mg | ORAL_TABLET | Freq: Three times a day (TID) | ORAL | Status: DC
  Administered 2014-11-06 – 2014-11-08 (×7): 400 mg via ORAL
  Filled 2014-11-06 (×9): qty 1

## 2014-11-06 MED ORDER — KETOROLAC TROMETHAMINE 15 MG/ML IJ SOLN
15.0000 mg | Freq: Three times a day (TID) | INTRAMUSCULAR | Status: DC | PRN
  Administered 2014-11-06 – 2014-11-08 (×5): 15 mg via INTRAVENOUS
  Filled 2014-11-06 (×5): qty 1

## 2014-11-06 NOTE — Progress Notes (Signed)
[place] at [place] NAME: Loretta Wade    MR#:  [id number]  DATE OF BIRTH:  1950-05-19  SUBJECTIVE:  CHIEF COMPLAINT:   Chief Complaint  Patient presents with  . Shortness of Breath   shortness of breath is the same. Cough present without sputum. No chest pain. Has orthopnea and some lower extremity edema. Increased urine output since yesterday  REVIEW OF SYSTEMS:    Review of Systems  Constitutional: Positive for malaise/fatigue. Negative for fever and chills.  HENT: Negative for sore throat.   Eyes: Negative for blurred vision, double vision and pain.  Respiratory: Positive for cough, shortness of breath and wheezing. Negative for hemoptysis and sputum production.   Cardiovascular: Negative for chest pain, palpitations, orthopnea and leg swelling.  Gastrointestinal: Negative for heartburn, nausea, vomiting, abdominal pain, diarrhea and constipation.  Genitourinary: Negative for dysuria and hematuria.  Musculoskeletal: Negative for back pain and joint pain.  Skin: Negative for rash.  Neurological: Positive for weakness. Negative for sensory change, speech change, focal weakness and headaches.  Endo/Heme/Allergies: Does not bruise/bleed easily.  Psychiatric/Behavioral: Negative for depression. The patient is not nervous/anxious.       DRUG ALLERGIES:  No Known Allergies  VITALS:  Blood pressure 139/74, pulse 73, temperature 97.7 F (36.5 C), temperature source Oral, resp. rate 20, height 5\' 4"  (1.626 m), weight 161.027 kg (355 lb), SpO2 95 %.  PHYSICAL EXAMINATION:   Physical Exam  GENERAL:  64 y.o.-year-old patient lying in the bed with no acute distress. Morbidly obese EYES: Pupils equal, round, reactive to light and accommodation. No scleral icterus. Extraocular muscles intact.  HEENT: Head atraumatic, normocephalic. Oropharynx and nasopharynx clear.  NECK:  Supple, no jugular venous distention. No thyroid  enlargement, no tenderness.  LUNGS: Increased work of breathing. Bilateral wheezing and decreased air entry. CARDIOVASCULAR: S1, S2 normal. No murmurs, rubs, or gallops.  ABDOMEN: Soft, nontender, nondistended. Bowel sounds present. No organomegaly or mass.  EXTREMITIES: No cyanosis, clubbing. 1+ edema NEUROLOGIC: Cranial nerves II through XII are intact. No focal Motor or sensory deficits b/l.   PSYCHIATRIC: The patient is alert and oriented x 3.  SKIN: No obvious rash, lesion, or ulcer.    LABORATORY PANEL:   CBC  Recent Labs Lab 11/05/14 0526  WBC 4.6  HGB 11.8*  HCT 36.2  PLT 222   ------------------------------------------------------------------------------------------------------------------  Chemistries   Recent Labs Lab 11/05/14 0526  NA 141  K 3.9  CL 103  CO2 30  GLUCOSE 164*  BUN 11  CREATININE 0.88  CALCIUM 9.7   ------------------------------------------------------------------------------------------------------------------  Cardiac Enzymes  Recent Labs Lab 11/04/14 1154  TROPONINI <0.03   ------------------------------------------------------------------------------------------------------------------  RADIOLOGY:  Dg Chest Port 1 View  11/04/2014   CLINICAL DATA:  Nonproductive cough with wheezing  EXAM: PORTABLE CHEST - 1 VIEW  COMPARISON:  Radiograph 09/01/2014  FINDINGS: Cardiac silhouette is mildly enlarged. There is central venous pulmonary congestion. Lung bases are poorly evaluated. No overt pulmonary edema.  IMPRESSION: Cardiomegaly and central venous congestion.   Electronically Signed   By: Suzy Bouchard M.D.   On: 11/04/2014 13:00     ASSESSMENT AND PLAN:   * Acute on chronic respiratory failure - No improvement Due to COPD exacerbation and acute on chronic diastolic congestive heart failure. Wean oxygen as tolerated.  * COPD exacerbation - No improvement. Still has wheezing -IV steroids, Antibiotics - Scheduled  Nebulizers - Inhalers -Wean O2 as tolerated - Consult pulmonary if no improvement  *  Acute on chronic diastolic congestive heart failure - IV Lasix, Beta blockers - Input and Output - Monitor Bun/Cr and Potassium - Echo pending -Cardiology follow up after discharge  * Chronic pain syndrome Continue medications from home  * Morbid obesity with sleep apnea  * DVT prophylaxis with Lovenox   All the records are reviewed and case discussed with Care Management/Social Workerr. Management plans discussed with the patient, family and they are in agreement.  CODE STATUS: Full code  DVT Prophylaxis: SCDs  TOTAL TIME TAKING CARE OF THIS PATIENT: 35 minutes.   POSSIBLE D/C IN 1-2 DAYS, DEPENDING ON CLINICAL CONDITION.   Hillary Bow R M.D on 11/06/2014 at 11:22 AM  Between 7am to 6pm - Pager - [phone]  After 6pm go to [email or web address] - password [email or web address] [facility]  [facility]  Office  [phone]  CC: Primary care physician; Keith Rake, MD

## 2014-11-06 NOTE — Care Management Important Message (Signed)
Important Message  Patient Details  Name: Loretta Wade MRN: [id number] Date of Birth: 1950-04-05   Medicare Important Message Given:  Yes-second notification given    Darius Bump Allmond 11/06/2014, 9:31 AM

## 2014-11-06 NOTE — Care Management (Signed)
Admitted to this facility with the diagnosis of COPD. Lives alone. Son is Erlene Quan [phone]). Friend is Ann [phone]). Last seen [name] in his office in June.  Followed by [facility] for nursing, physical therapy, and nursing assistance. States she is also being followed by [facility]. Home oxygen thru [employer] x 1.5 years. [facility] in the past. [facility] last March. States she feeds herself, needs help with baths. Friend and son help with errands. Cooks some. Lives at [facility] for about a year. Son or friend will transport. Shelbie Ammons RN MSN Care Management [phone]

## 2014-11-06 NOTE — Plan of Care (Signed)
Problem: Discharge Progression Outcomes Goal: Activity appropriate for discharge plan Outcome: Progressing Patient continues on humidified 02 at 2L sats in the mid to upper 90's,  Continues with IV Lasix and breathing treatments  Patient c/o chest pain x1 relieved well with prn Toradol PT came to work with patient at dinner time but patient declined until tomorrow  Patient continue to tolerate diet well Patient is a one assist to the bathroom

## 2014-11-06 NOTE — Progress Notes (Signed)
PT Cancellation Note  Patient Details Name: Loretta Wade MRN: [id number] DOB: 1951-03-05   Cancelled Treatment:    Reason Eval/Treat Not Completed: Patient declined, no reason specified Pt sitting at EOB eating dinner, reports she doesn't want to do anything today and requests to hold PT until tomorrow.  Kreg Shropshire 11/06/2014, 4:55 PM

## 2014-11-07 LAB — BASIC METABOLIC PANEL
Anion gap: 7 (ref 5–15)
BUN: 27 mg/dL — AB (ref 6–20)
CALCIUM: 9.2 mg/dL (ref 8.9–10.3)
CO2: 35 mmol/L — AB (ref 22–32)
CREATININE: 1.19 mg/dL — AB (ref 0.44–1.00)
Chloride: 99 mmol/L — ABNORMAL LOW (ref 101–111)
GFR calc non Af Amer: 48 mL/min — ABNORMAL LOW (ref >60)
GFR, EST AFRICAN AMERICAN: 56 mL/min — AB (ref >60)
GLUCOSE: 118 mg/dL — AB (ref 65–99)
Potassium: 4.1 mmol/L (ref 3.5–5.1)
Sodium: 141 mmol/L (ref 135–145)

## 2014-11-07 MED ORDER — NYSTATIN 100000 UNIT/ML MT SUSP
5.0000 mL | Freq: Two times a day (BID) | OROMUCOSAL | Status: DC
  Administered 2014-11-07 – 2014-11-08 (×3): 500000 [IU] via ORAL
  Filled 2014-11-07 (×3): qty 5

## 2014-11-07 MED ORDER — HYDROCODONE-ACETAMINOPHEN 5-325 MG PO TABS
1.0000 | ORAL_TABLET | Freq: Once | ORAL | Status: AC
  Administered 2014-11-07: 23:00:00 1 via ORAL
  Filled 2014-11-07: qty 1

## 2014-11-07 MED ORDER — FUROSEMIDE 10 MG/ML IJ SOLN
20.0000 mg | Freq: Two times a day (BID) | INTRAMUSCULAR | Status: DC
  Administered 2014-11-07 – 2014-11-08 (×2): 20 mg via INTRAVENOUS
  Filled 2014-11-07 (×2): qty 2

## 2014-11-07 NOTE — Consult Note (Signed)
[facility] Cardiology  CARDIOLOGY CONSULT NOTE  Patient ID: MASAYE GATCHALIAN MRN: [id number] DOB/AGE: [birth year] 62 y.o.  Admit date: 11/04/2014 Referring Physician [facility] Primary Physician  Primary Cardiologist  Reason for Consultation chest pain  HPI: 64 year old female with COPD, sleep apnea, essential hypertension admitted with COPD exacerbation. Patient referred for evaluation of chest pain. Patient describes a one year history of intermittent episodes of chest pain. She describes sharp chest discomfort, like using needles, unrelated to exertion. He does experience chest tightness which may be related to her underlying COPD. Patient also has a history of gastroesophageal reflux disease. The patient has ruled out for myocardial infarction with negative troponin. EKG reveals normal sinus rhythm, normal ECG. She was over 350 pounds.  Review of systems complete and found to be negative unless listed above     Past Medical History  Diagnosis Date  . OA (osteoarthritis)   . Fibromyalgia   . Anemia   . Sleep apnea   . Fatigue   . Edema   . Esophageal reflux   . Nocturia   . Hypertension   . COPD (chronic obstructive pulmonary disease)     Past Surgical History  Procedure Laterality Date  . Cholecystectomy    . Total knee arthroplasty      right  . Vesicovaginal fistula closure w/ tah    . Abdominal hysterectomy      Prescriptions prior to admission  Medication Sig Dispense Refill Last Dose  . acetaminophen (TYLENOL) 500 MG tablet Take 1,000 mg by mouth every 6 (six) hours as needed for mild pain or headache.   Past Week at Unknown time  . albuterol (PROVENTIL HFA;VENTOLIN HFA) 108 (90 BASE) MCG/ACT inhaler Inhale 2 puffs into the lungs every 6 (six) hours as needed for wheezing or shortness of breath.    11/04/2014 at Unknown time  . Chlorpheniramine-Acetaminophen (CORICIDIN HBP COLD/FLU PO) Take 2 tablets by mouth daily as needed (for nasal congestion/cough).    11/03/2014 at 1000   . cholecalciferol (VITAMIN D) 1000 UNITS tablet Take 1,000 Units by mouth daily.    11/03/2014 at Unknown time  . dexlansoprazole (DEXILANT) 60 MG capsule Take 60 mg by mouth daily.   11/03/2014 at Unknown time  . etodolac (LODINE) 300 MG capsule Take 300 mg by mouth every 8 (eight) hours.   11/03/2014 at Unknown time  . Fluticasone Furoate-Vilanterol 100-25 MCG/INH AEPB Inhale 1 puff into the lungs 2 (two) times daily.    11/04/2014 at Unknown time  . furosemide (LASIX) 20 MG tablet Take 20 mg by mouth daily.    11/03/2014 at Unknown time  . guaiFENesin-dextromethorphan (ROBITUSSIN DM) 100-10 MG/5ML syrup Take 10 mLs by mouth every 4 (four) hours as needed for cough.   11/03/2014 at 1000  . hydrochlorothiazide (HYDRODIURIL) 25 MG tablet Take 25 mg by mouth daily.   11/03/2014 at Unknown time  . ipratropium-albuterol (DUONEB) 0.5-2.5 (3) MG/3ML SOLN Take 3 mLs by nebulization every 6 (six) hours as needed (for shortness of breath).    11/04/2014 at Unknown time  . Magnesium 250 MG TABS Take 250 mg by mouth daily.    11/03/2014 at Unknown time  . olmesartan (BENICAR) 20 MG tablet Take 20 mg by mouth daily.   11/03/2014 at Unknown time  . pantoprazole (PROTONIX) 40 MG tablet Take 40 mg by mouth at bedtime.   11/03/2014 at Unknown time  . pregabalin (LYRICA) 150 MG capsule Take 150 mg by mouth 2 (two) times daily.   11/03/2014 at  Unknown time  . propranolol ER (INDERAL LA) 80 MG 24 hr capsule Take 1 capsule (80 mg total) by mouth daily. 90 capsule 0 11/03/2014 at 1000  . rOPINIRole (REQUIP) 0.25 MG tablet Take 3 tablets (0.75 mg total) by mouth at bedtime. 90 tablet 2 11/03/2014 at Unknown time  . tiotropium (SPIRIVA) 18 MCG inhalation capsule Place 18 mcg into inhaler and inhale daily.   11/04/2014 at Unknown time  . cephALEXin (KEFLEX) 500 MG capsule Take 1 capsule (500 mg total) by mouth 4 (four) times daily. (Patient not taking: Reported on 08/18/2014) 28 capsule 0 Not Taking  . nitrofurantoin,  macrocrystal-monohydrate, (MACROBID) 100 MG capsule Take 1 capsule (100 mg total) by mouth 2 (two) times daily. (Patient not taking: Reported on 10/14/2014) 14 capsule 0 Not Taking  . penicillin v potassium (VEETID) 500 MG tablet Take 1 tablet (500 mg total) by mouth 3 (three) times daily. (Patient not taking: Reported on 09/16/2014) 15 tablet 0 Not Taking   Social History   Social History  . Marital Status: Single    Spouse Name: N/A  . Number of Children: N/A  . Years of Education: N/A   Occupational History  . Not on file.   Social History Main Topics  . Smoking status: Former Smoker    Quit date: 11/04/1983  . Smokeless tobacco: Not on file  . Alcohol Use: No  . Drug Use: No  . Sexual Activity: Not on file   Other Topics Concern  . Not on file   Social History Narrative    Family History  Problem Relation Age of Onset  . Hypertension Mother   . Diabetes    . Breast cancer Daughter       Review of systems complete and found to be negative unless listed above      PHYSICAL EXAM  General: Well developed, well nourished, in no acute distress HEENT:  Normocephalic and atramatic Neck:  No JVD.  Lungs: Clear bilaterally to auscultation and percussion. Heart: HRRR . Normal S1 and S2 without gallops or murmurs.  Abdomen: Bowel sounds are positive, abdomen soft and non-tender  Msk:  Back normal, normal gait. Normal strength and tone for age. Extremities: No clubbing, cyanosis or edema.   Neuro: Alert and oriented X 3. Psych:  Good affect, responds appropriately  Labs:   Lab Results  Component Value Date   WBC 4.6 11/05/2014   HGB 11.8* 11/05/2014   HCT 36.2 11/05/2014   MCV 93.5 11/05/2014   PLT 222 11/05/2014    Recent Labs Lab 11/07/14 0423  NA 141  K 4.1  CL 99*  CO2 35*  BUN 27*  CREATININE 1.19*  CALCIUM 9.2  GLUCOSE 118*   Lab Results  Component Value Date   TROPONINI <0.03 11/04/2014   No results found for: CHOL No results found for:  HDL No results found for: LDLCALC No results found for: TRIG No results found for: CHOLHDL No results found for: LDLDIRECT    Radiology: Dg Chest Port 1 View  11/04/2014   CLINICAL DATA:  Nonproductive cough with wheezing  EXAM: PORTABLE CHEST - 1 VIEW  COMPARISON:  Radiograph 09/01/2014  FINDINGS: Cardiac silhouette is mildly enlarged. There is central venous pulmonary congestion. Lung bases are poorly evaluated. No overt pulmonary edema.  IMPRESSION: Cardiomegaly and central venous congestion.   Electronically Signed   By: Suzy Bouchard M.D.   On: 11/04/2014 13:00    EKG: Normal sinus rhythm  ASSESSMENT AND PLAN:  1. Chest pain, atypical, negative troponin, normal ECG, normal left ventricular function by echocardiogram 2. COPD exacerbation, sleep apnea, slowly improving 3. Morbid obesity  Recommendations  1. Defer full dose anticoagulation 2. Consider Lexiscan sestamibi study. However, patient may exceed weight limit. This could be done as outpatient.  SignedIsaias Cowman MD,PhD, [facility] 11/07/2014, 2:28 PM

## 2014-11-07 NOTE — Evaluation (Signed)
Physical Therapy Evaluation Patient Details Name: Loretta Wade MRN: [id number] DOB: [birth year] Today's Date: 11/07/2014   History of Present Illness  Pt here with COPD exacerbation  Clinical Impression  Pt was able to do some limited in-room ambulation and actually did fairly well with bed mobility and transfers but fatigues very quickly with activity and is not confident or secure.  She strongly wants to go home and this would be realistic if she shows increased tolerance and confidence as well as had some regular assist in the home - for now she needs further strengthening and ability for better/longer ambulation.     Follow Up Recommendations Home health PT;SNF;Supervision - Intermittent (pt wants to go home will need to be more confident)    Equipment Recommendations       Recommendations for Other Services       Precautions / Restrictions Precautions Precautions: Fall Restrictions Weight Bearing Restrictions: No      Mobility  Bed Mobility Overal bed mobility: Needs Assistance Bed Mobility: Supine to Sit     Supine to sit: Min guard     General bed mobility comments: Pt is slow and labored getting to EOB, but is able to get to sitting with no direct PT assist (heavy rail use)  Transfers Overall transfer level: Needs assistance Equipment used: Rolling walker (2 wheeled) Transfers: Sit to/from Stand Sit to Stand: Min guard            Ambulation/Gait Ambulation/Gait assistance: Mod assist;Min assist Ambulation Distance (Feet): 20 Feet Assistive device: Rolling walker (2 wheeled)       General Gait Details: Pt with slow, guarded ambulation and has signficant fatigue with limited ambulation.  She has no LOBs but is not overly confident.  Stairs            Wheelchair Mobility    Modified Rankin (Stroke Patients Only)       Balance                                             Pertinent Vitals/Pain Pain Assessment:   (minimal pain)    Home Living Family/patient expects to be discharged to:: Private residence Living Arrangements: Alone Available Help at Discharge: Family (currently has aides assisting) Type of Home: House                Prior Function Level of Independence: Independent with assistive device(s)         Comments: Pt reports that she is rarely out of the house.  Needs assist with bathing, and some other basic in home ADLs.     Hand Dominance        Extremity/Trunk Assessment   Upper Extremity Assessment: Generalized weakness (b/l shoulder elevation signficantly limited)           Lower Extremity Assessment: Generalized weakness         Communication   Communication: No difficulties  Cognition Arousal/Alertness: Awake/alert Behavior During Therapy: WFL for tasks assessed/performed Overall Cognitive Status: Within Functional Limits for tasks assessed                      General Comments      Exercises        Assessment/Plan    PT Assessment Patient needs continued PT services  PT Diagnosis Difficulty walking;Generalized weakness   PT Problem List Decreased  strength;Decreased range of motion;Decreased activity tolerance;Decreased balance;Decreased mobility;Decreased coordination;Decreased safety awareness  PT Treatment Interventions Gait training;Stair training;Functional mobility training;Therapeutic activities;Therapeutic exercise;Balance training;Neuromuscular re-education   PT Goals (Current goals can be found in the Care Plan section) Acute Rehab PT Goals Patient Stated Goal: "I really do want to go home, not rehab" PT Goal Formulation: With patient Time For Goal Achievement: 11/21/14 Potential to Achieve Goals: Fair    Frequency Min 2X/week   Barriers to discharge        Co-evaluation               End of Session Equipment Utilized During Treatment: Gait belt Activity Tolerance: Patient limited by fatigue Patient left:  with chair alarm set           Time: 1300-1320 PT Time Calculation (min) (ACUTE ONLY): 20 min   Charges:   PT Evaluation $Initial PT Evaluation Tier I: 1 Procedure     PT G Codes:       Wayne Both, PT, DPT [phone]  Kreg Shropshire 11/07/2014, 4:03 PM

## 2014-11-07 NOTE — Plan of Care (Signed)
Problem: Discharge Progression Outcomes Goal: Other Discharge Outcomes/Goals Outcome: Progressing Pt alert and oriented. Pt with c/o head, back and chest pain. Relief noted with prn medication. Teds removed per pt request. Pt stated they were painful. Up with 1 assist to [facility]. O2 continues at 2L. Pt uses 2L O2 at home. Continues to get SOB with exertion. Scheduled duo-nebs offer some relief.

## 2014-11-07 NOTE — Progress Notes (Signed)
Pt requested med for white areas on corners of lips with no thrush in mouth/poor dentation noted. Reports she has used a med that swished and swallowed in past and is concerned that she is getting thrush. TC to Dr. Anselm Jungling with order received.

## 2014-11-07 NOTE — Progress Notes (Signed)
[facility] [place] NAME: Loretta Wade    MR#:  [id number]  DATE OF BIRTH:  16-Jul-1950  SUBJECTIVE:  CHIEF COMPLAINT:   Chief Complaint  Patient presents with  . Shortness of Breath   shortness of breath is the same. Cough present without sputum.  C/o chest pain on and  Off- not realted to activities, did not participate with PT yesterday.  REVIEW OF SYSTEMS:    Review of Systems  Constitutional: Positive for malaise/fatigue. Negative for fever and chills.  HENT: Negative for sore throat.   Eyes: Negative for blurred vision, double vision and pain.  Respiratory: Positive for cough, shortness of breath and wheezing. Negative for hemoptysis and sputum production.   Cardiovascular: Positive for chest pain. Negative for palpitations, orthopnea and leg swelling.  Gastrointestinal: Negative for heartburn, nausea, vomiting, abdominal pain, diarrhea and constipation.  Genitourinary: Negative for dysuria and hematuria.  Musculoskeletal: Negative for back pain and joint pain.  Skin: Negative for rash.  Neurological: Positive for weakness. Negative for sensory change, speech change, focal weakness and headaches.  Endo/Heme/Allergies: Does not bruise/bleed easily.  Psychiatric/Behavioral: Negative for depression. The patient is not nervous/anxious.      DRUG ALLERGIES:  No Known Allergies  VITALS:  Blood pressure 128/65, pulse 69, temperature 98 F (36.7 C), temperature source Oral, resp. rate 20, height 5\' 4"  (1.626 m), weight 161.481 kg (356 lb), SpO2 96 %.  PHYSICAL EXAMINATION:   Physical Exam  GENERAL:  64 y.o.-year-old patient lying in the bed with no acute distress. Morbidly obese EYES: Pupils equal, round, reactive to light and accommodation. No scleral icterus. Extraocular muscles intact.  HEENT: Head atraumatic, normocephalic. Oropharynx and nasopharynx clear.  NECK:  Supple, no jugular venous distention. No thyroid  enlargement, no tenderness.  LUNGS: Increased work of breathing. Bilateral minimal wheezing and decreased air entry. CARDIOVASCULAR: S1, S2 normal. No murmurs, rubs, or gallops.  ABDOMEN: Soft, nontender, nondistended. Bowel sounds present. No organomegaly or mass.  EXTREMITIES: No cyanosis, clubbing. 1+ edema NEUROLOGIC: Cranial nerves II through XII are intact. No focal Motor or sensory deficits b/l.   PSYCHIATRIC: The patient is alert and oriented x 3.  SKIN: No obvious rash, lesion, or ulcer.    LABORATORY PANEL:   CBC  Recent Labs Lab 11/05/14 0526  WBC 4.6  HGB 11.8*  HCT 36.2  PLT 222   ------------------------------------------------------------------------------------------------------------------  Chemistries   Recent Labs Lab 11/07/14 0423  NA 141  K 4.1  CL 99*  CO2 35*  GLUCOSE 118*  BUN 27*  CREATININE 1.19*  CALCIUM 9.2   ------------------------------------------------------------------------------------------------------------------  Cardiac Enzymes  Recent Labs Lab 11/04/14 1154  TROPONINI <0.03   ------------------------------------------------------------------------------------------------------------------  RADIOLOGY:  No results found.   ASSESSMENT AND PLAN:   * Acute on chronic respiratory failure - some improvement Due to COPD exacerbation and acute on chronic diastolic congestive heart failure. Wean oxygen as tolerated.  * COPD exacerbation - No improvement. Still has wheezing -IV steroids, Antibiotics - Scheduled Nebulizers - Inhalers -Wean O2 as tolerated- she is on 2 ltr oxygen at home.  * Acute on chronic diastolic congestive heart failure - IV Lasix, Beta blockers - Input and Output- had total 5 ltr negative fluid balance. - Monitor Bun/Cr and Potassium - Echo reviewed. -Cardiology consult called in due to chest pain complain.  * Chronic pain syndrome Continue medications from home  * Morbid obesity with sleep  apnea  * DVT prophylaxis with Lovenox  All the records are reviewed and case discussed with Care Management/Social Workerr. Management plans discussed with the patient, family and they are in agreement.  CODE STATUS: Full code  DVT Prophylaxis: SCDs  TOTAL TIME TAKING CARE OF THIS PATIENT: 35 minutes.   POSSIBLE D/C IN 1-2 DAYS, DEPENDING ON CLINICAL CONDITION.   Vaughan Basta M.D on 11/07/2014 at 9:21 AM  Between 7am to 6pm - Pager - [phone]  After 6pm go to [email or web address] - password [email or web address] [facility]  [facility]  Office  [phone]  CC: Primary care physician; Keith Rake, MD

## 2014-11-07 NOTE — Plan of Care (Signed)
Problem: Discharge Progression Outcomes Goal: Other Discharge Outcomes/Goals Outcome: Progressing 1. Weaning 02 with pulse ox 95 % on 1l/[place] with 02 taken off at rest with pt tolerating RA. DOE with 02 used for recovery for 5-19 minuties. Pt uses chronic 02 at home. 2. No respirtory issues except DOE without distress which is pt's baseline per pt. 3. Requires assistance with some personal care; SB+ with bed/chair transfers. 4. Eating 100% diet and tolerating. 5. Pt educated on self rehab by PT to work on being activity and increase activity slowly. Uses walker. Pt verbalized desire to lose weight. 6. Pt has chronic back/neck/ arm/leg pain with scheduled med given. Toradol IV x 1 effective for HA. Pt reports pain controlled. 7. Ambulated to chair/bed with PT and CNA with SB+/walker. 8. VSS. Labs reviewed. 9. Nystatin ordered for white patches in corners of lips.

## 2014-11-08 LAB — BASIC METABOLIC PANEL
ANION GAP: 7 (ref 5–15)
BUN: 34 mg/dL — ABNORMAL HIGH (ref 6–20)
CO2: 36 mmol/L — ABNORMAL HIGH (ref 22–32)
Calcium: 9.1 mg/dL (ref 8.9–10.3)
Chloride: 98 mmol/L — ABNORMAL LOW (ref 101–111)
Creatinine, Ser: 1.13 mg/dL — ABNORMAL HIGH (ref 0.44–1.00)
GFR, EST AFRICAN AMERICAN: 59 mL/min — AB (ref >60)
GFR, EST NON AFRICAN AMERICAN: 51 mL/min — AB (ref >60)
Glucose, Bld: 109 mg/dL — ABNORMAL HIGH (ref 65–99)
POTASSIUM: 4.1 mmol/L (ref 3.5–5.1)
SODIUM: 141 mmol/L (ref 135–145)

## 2014-11-08 MED ORDER — PANTOPRAZOLE SODIUM 40 MG PO TBEC: 40.0000 mg | DELAYED_RELEASE_TABLET | Freq: Two times a day (BID) | ORAL | Status: DC

## 2014-11-08 MED ORDER — PREDNISONE 10 MG (21) PO TBPK: 10.0000 mg | ORAL_TABLET | Freq: Every day | ORAL | Status: DC

## 2014-11-08 MED ORDER — LEVOFLOXACIN 750 MG PO TABS: 750.0000 mg | ORAL_TABLET | Freq: Every day | ORAL | Status: DC

## 2014-11-08 MED ORDER — ALBUTEROL SULFATE (2.5 MG/3ML) 0.083% IN NEBU
INHALATION_SOLUTION | RESPIRATORY_TRACT | Status: AC
  Filled 2014-11-08: qty 3

## 2014-11-08 MED ORDER — BUTALBITAL-APAP-CAFFEINE 50-325-40 MG PO TABS
1.0000 | ORAL_TABLET | Freq: Once | ORAL | Status: AC
  Administered 2014-11-08: 11:00:00 1 via ORAL
  Filled 2014-11-08: qty 1

## 2014-11-08 NOTE — Care Management Note (Signed)
Case Management Note  Patient Details  Name: Loretta Wade MRN: [id number] Date of Birth: April 12, 1950  Subjective/Objective:     [name] is currently an open client of [facility] receiving PT, RN, Aid. Resumption of care orders faxed and called to [facility]. Confirmed via telephone order from Dr Anselm Jungling.              Action/Plan:   Expected Discharge Date:                  Expected Discharge Plan:     In-House Referral:     Discharge planning Services     Post Acute Care Choice:    Choice offered to:     DME Arranged:    DME Agency:     HH Arranged:    [place] Agency:     Status of Service:     Medicare Important Message Given:  Yes-second notification given Date Medicare IM Given:    Medicare IM give by:    Date Additional Medicare IM Given:    Additional Medicare Important Message give by:     If discussed at [place] of Stay Meetings, dates discussed:    Additional Comments:  Bomani Oommen A, RN 11/08/2014, 10:29 AM

## 2014-11-08 NOTE — Progress Notes (Signed)
Pt for discharge home , a/o. Cont with some  Dyspnea  With increased exertion.  Headache improved.  Instructions discussed with pt. presc given and  It and other meds discussed . Diet / activity and f/u discussed. Home  Via w/c w/o c/o.

## 2014-11-08 NOTE — Discharge Instructions (Signed)
Acute Respiratory Failure °Respiratory failure is when your lungs are not working well and your breathing (respiratory) system fails. When respiratory failure occurs, it is difficult for your lungs to get enough oxygen, get rid of carbon dioxide, or both. Respiratory failure can be life threatening.  °Respiratory failure can be acute or chronic. Acute respiratory failure is sudden, severe, and requires emergency medical treatment. Chronic respiratory failure is less severe, happens over time, and requires ongoing treatment.  °WHAT ARE THE CAUSES OF ACUTE RESPIRATORY FAILURE?  °Any problem affecting the heart or lungs can cause acute respiratory failure. Some of these causes include the following: °· Chronic bronchitis and emphysema (COPD).   °· Blood clot going to a lung (pulmonary embolism).   °· Having water in the lungs caused by heart failure, lung injury, or infection (pulmonary edema).   °· Collapsed lung (pneumothorax).   °· Pneumonia.   °· Pulmonary fibrosis.   °· Obesity.   °· Asthma.   °· Heart failure.   °· Any type of trauma to the chest that can make breathing difficult.   °· Nerve or muscle diseases making chest movements difficult. °WHAT SYMPTOMS SHOULD YOU WATCH FOR?  °If you have any of these signs or symptoms, you should seek immediate medical care:  °· You have shortness of breath (dyspnea) with or without activity.   °· You have rapid, fast breathing (tachypnea).   °· You are wheezing. °· You are unable to say more than a few words without having to catch your breath. °· You find it very difficult to function normally. °· You have a fast heart rate.   °· You have a bluish color to your finger or toe nail beds.   °· You have confusion or drowsiness or both.   °HOW WILL MY ACUTE RESPIRATORY FAILURE BE TREATED?  °Treatment of acute respiratory failure depends on the cause of the respiratory failure. Usually, you will stay in the intensive care unit so your breathing can be watched closely. Treatment  can include the following: °· Oxygen. Oxygen can be delivered through the following: °¨ Nasal cannula. This is small tubing that goes in your nose to give you oxygen. °¨ Face mask. A face mask covers your nose and mouth to give you oxygen. °· Medicine. Different medicines can be given to help with breathing. These can include: °¨ Nebulizers. Nebulizers deliver medicines to open the air passages (bronchodilators). These medicines help to open or relax the airways in the lungs so you can breathe better. They can also help loosen mucus from your lungs. °¨ Diuretics. Diuretic medicines can help you breathe better by getting rid of extra water in your body. °¨ Steroids. Steroid medicines can help decrease swelling (inflammation) in your lungs. °¨ Antibiotics. °· Chest tube. If you have a collapsed lung (pneumothorax), a chest tube is placed to help reinflate the lung. °· Non-invasive positive pressure ventilation (NPPV). This is a tight-fitting mask that goes over your nose and mouth. The mask has tubing that is attached to a machine. The machine blows air into the tubing, which helps to keep the tiny air sacs (alveoli) in your lungs open. This machine allows you to breathe on your own. °· Ventilator. A ventilator is a breathing machine. When on a ventilator, a breathing tube is put into the lungs. A ventilator is used when you can no longer breathe well enough on your own. You may have low oxygen levels or high carbon dioxide (CO2) levels in your blood. When you are on a ventilator, sedation and pain medicines are given to make you sleep   so your lungs can heal. °Document Released: 02/25/2013 Document Revised: 07/07/2013 Document Reviewed: 02/25/2013 °[employer]® Patient Information ©2015 [employer]. This information is not intended to replace advice given to you by your health care provider. Make sure you discuss any questions you have with your health care provider. ° °

## 2014-11-08 NOTE — Plan of Care (Signed)
Problem: Discharge Progression Outcomes Goal: Home O2 if indicated Outcome: Progressing Pt on home o2 Goal: Independent ADLs or Home Health Care Outcome: Progressing Uses walker to get around Goal: Pain controlled with appropriate interventions Outcome: Progressing Med for headache Goal: Other Discharge Outcomes/Goals Outcome: Progressing Advanced homecare and thn  Will follow pt

## 2014-11-08 NOTE — Discharge Summary (Signed)
[facility] [place] NAME: Loretta Wade    MR#:  [id number]  DATE OF BIRTH:  1950-07-08  DATE OF ADMISSION:  11/04/2014 ADMITTING PHYSICIAN: Vaughan Basta, MD  DATE OF DISCHARGE: 11/08/2014  PRIMARY CARE PHYSICIAN: Keith Rake, MD    ADMISSION DIAGNOSIS:  COPD with exacerbation [J44.1]  DISCHARGE DIAGNOSIS:  Principal Problem:   COPD exacerbation   SECONDARY DIAGNOSIS:   Past Medical History  Diagnosis Date  . OA (osteoarthritis)   . Fibromyalgia   . Anemia   . Sleep apnea   . Fatigue   . Edema   . Esophageal reflux   . Nocturia   . Hypertension   . COPD (chronic obstructive pulmonary disease)     HOSPITAL COURSE:   * Acute on chronic respiratory failure - some improvement Due to COPD exacerbation and acute on chronic diastolic congestive heart failure. Wean oxygen as tolerated. on baseline 2 ltr o2 at home.  * COPD exacerbation - No improvement. Still has wheezing -IV steroids, Antibiotics - Scheduled Nebulizers - Inhalers -Wean O2 as tolerated- she is on 2 ltr oxygen at home.  * Acute on chronic diastolic congestive heart failure - IV Lasix, Beta blockers - Input and Output- had total 5 ltr negative fluid balance. - Monitor Bun/Cr and Potassium- stable. - Echo reviewed. - Cardiology consult called in due to chest pain complain- he suggested out pt work up as troponin and EKG are normal .  * Chronic pain syndrome Continue medications from home  * Morbid obesity with sleep apnea  * DVT prophylaxis with Lovenox  DISCHARGE CONDITIONS:   Stable.  CONSULTS OBTAINED:  Treatment Team:  Isaias Cowman, MD  DRUG ALLERGIES:  No Known Allergies  DISCHARGE MEDICATIONS:   Current Discharge Medication List    START taking these medications   Details  levofloxacin (LEVAQUIN) 750 MG tablet Take 1 tablet (750 mg total) by mouth daily. Qty: 3 tablet, Refills: 0      CONTINUE these  medications which have CHANGED   Details  pantoprazole (PROTONIX) 40 MG tablet Take 1 tablet (40 mg total) by mouth 2 (two) times daily. Qty: 60 tablet, Refills: 0      CONTINUE these medications which have NOT CHANGED   Details  acetaminophen (TYLENOL) 500 MG tablet Take 1,000 mg by mouth every 6 (six) hours as needed for mild pain or headache.    albuterol (PROVENTIL HFA;VENTOLIN HFA) 108 (90 BASE) MCG/ACT inhaler Inhale 2 puffs into the lungs every 6 (six) hours as needed for wheezing or shortness of breath.     Chlorpheniramine-Acetaminophen (CORICIDIN HBP COLD/FLU PO) Take 2 tablets by mouth daily as needed (for nasal congestion/cough).     cholecalciferol (VITAMIN D) 1000 UNITS tablet Take 1,000 Units by mouth daily.     dexlansoprazole (DEXILANT) 60 MG capsule Take 60 mg by mouth daily.    etodolac (LODINE) 300 MG capsule Take 300 mg by mouth every 8 (eight) hours.    Fluticasone Furoate-Vilanterol 100-25 MCG/INH AEPB Inhale 1 puff into the lungs 2 (two) times daily.     furosemide (LASIX) 20 MG tablet Take 20 mg by mouth daily.     guaiFENesin-dextromethorphan (ROBITUSSIN DM) 100-10 MG/5ML syrup Take 10 mLs by mouth every 4 (four) hours as needed for cough.    hydrochlorothiazide (HYDRODIURIL) 25 MG tablet Take 25 mg by mouth daily.    ipratropium-albuterol (DUONEB) 0.5-2.5 (3) MG/3ML SOLN Take 3 mLs by nebulization every 6 (six) hours  as needed (for shortness of breath).     Magnesium 250 MG TABS Take 250 mg by mouth daily.     olmesartan (BENICAR) 20 MG tablet Take 20 mg by mouth daily.    pregabalin (LYRICA) 150 MG capsule Take 150 mg by mouth 2 (two) times daily.    propranolol ER (INDERAL LA) 80 MG 24 hr capsule Take 1 capsule (80 mg total) by mouth daily. Qty: 90 capsule, Refills: 0   Associated Diagnoses: Essential hypertension    rOPINIRole (REQUIP) 0.25 MG tablet Take 3 tablets (0.75 mg total) by mouth at bedtime. Qty: 90 tablet, Refills: 2   Associated  Diagnoses: RLS (restless legs syndrome)    tiotropium (SPIRIVA) 18 MCG inhalation capsule Place 18 mcg into inhaler and inhale daily.      STOP taking these medications     cephALEXin (KEFLEX) 500 MG capsule      nitrofurantoin, macrocrystal-monohydrate, (MACROBID) 100 MG capsule      penicillin v potassium (VEETID) 500 MG tablet          DISCHARGE INSTRUCTIONS:    Follow with Cardiology clinic in 2 weeks.  If you experience worsening of your admission symptoms, develop shortness of breath, life threatening emergency, suicidal or homicidal thoughts you must seek medical attention immediately by calling 911 or calling your MD immediately  if symptoms less severe.  You Must read complete instructions/literature along with all the possible adverse reactions/side effects for all the Medicines you take and that have been prescribed to you. Take any new Medicines after you have completely understood and accept all the possible adverse reactions/side effects.   Please note  You were cared for by a hospitalist during your hospital stay. If you have any questions about your discharge medications or the care you received while you were in the hospital after you are discharged, you can call the unit and asked to speak with the hospitalist on call if the hospitalist that took care of you is not available. Once you are discharged, your primary care physician will handle any further medical issues. Please note that NO REFILLS for any discharge medications will be authorized once you are discharged, as it is imperative that you return to your primary care physician (or establish a relationship with a primary care physician if you do not have one) for your aftercare needs so that they can reassess your need for medications and monitor your lab values.    Today   CHIEF COMPLAINT:   Chief Complaint  Patient presents with  . Shortness of Breath    HISTORY OF PRESENT ILLNESS:  Loretta Wade   is a 64 y.o. female with a known history of hypertension, COPD, sleep apnea not using CPAP currently for more than a year, fibromyalgia, osteoarthritis, chronically on oxygen at home 2 L/m- had been feeling increasingly short of breath for last 1 week. She lives in assisted living facility and there is a visiting nurse over there she called her PMD and got a prescription of Z-Pak which she took for 5 days but it did not help much she also has a nebulizer machine at home which she continued using 3 times per day. Still she has complain of excessive shortness of breath on minimal exertion, dry cough, chest pain which is worse with deep breathing or coughing and so decided to come to emergency room. She is given some nebulizer treatments steroid injections and given to hospitalist team for admission for COPD.   VITAL SIGNS:  Blood pressure 148/76, pulse 72, temperature 97.5 F (36.4 C), temperature source Oral, resp. rate 22, height 5\' 4"  (1.626 m), weight 158.94 kg (350 lb 6.4 oz), SpO2 94 %.  I/O:   Intake/Output Summary (Last 24 hours) at 11/08/14 1157 Last data filed at 11/08/14 0800  Gross per 24 hour  Intake    720 ml  Output    900 ml  Net   -180 ml    PHYSICAL EXAMINATION:   GENERAL: 64 y.o.-year-old patient lying in the bed with no acute distress. Morbidly obese EYES: Pupils equal, round, reactive to light and accommodation. No scleral icterus. Extraocular muscles intact.  HEENT: Head atraumatic, normocephalic. Oropharynx and nasopharynx clear.  NECK: Supple, no jugular venous distention. No thyroid enlargement, no tenderness.  LUNGS: Increased work of breathing. Bilateral minimal wheezing and decreased air entry. CARDIOVASCULAR: S1, S2 normal. No murmurs, rubs, or gallops.  ABDOMEN: Soft, nontender, nondistended. Bowel sounds present. No organomegaly or mass.  EXTREMITIES: No cyanosis, clubbing. 1+ edema NEUROLOGIC: Cranial nerves II through XII are intact. No focal Motor or  sensory deficits b/l.  PSYCHIATRIC: The patient is alert and oriented x 3.  SKIN: No obvious rash, lesion, or ulcer.   DATA REVIEW:   CBC  Recent Labs Lab 11/05/14 0526  WBC 4.6  HGB 11.8*  HCT 36.2  PLT 222    Chemistries   Recent Labs Lab 11/08/14 0557  NA 141  K 4.1  CL 98*  CO2 36*  GLUCOSE 109*  BUN 34*  CREATININE 1.13*  CALCIUM 9.1    Cardiac Enzymes  Recent Labs Lab 11/04/14 1154  TROPONINI <0.03    Microbiology Results  Results for orders placed or performed during the hospital encounter of 08/23/14  Urine culture     Status: None   Collection Time: 08/23/14  2:46 PM  Result Value Ref Range Status   Specimen Description URINE, RANDOM  Final   Special Requests NONE  Final   Culture >=100,000 COLONIES/mL ENTEROBACTER AEROGENES  Final   Report Status 08/26/2014 FINAL  Final   Organism ID, Bacteria ENTEROBACTER AEROGENES  Final      Susceptibility   Enterobacter aerogenes - MIC*    CEFTAZIDIME <=1 SENSITIVE Sensitive     CEFAZOLIN >=64 RESISTANT Resistant     CEFTRIAXONE <=1 SENSITIVE Sensitive     CIPROFLOXACIN <=0.25 SENSITIVE Sensitive     GENTAMICIN <=1 SENSITIVE Sensitive     IMIPENEM 1 SENSITIVE Sensitive     TRIMETH/SULFA <=20 SENSITIVE Sensitive     CEFOXITIN >=64 RESISTANT Resistant     NITROFURANTOIN Value in next row Resistant      RESISTANT128    * >=100,000 COLONIES/mL ENTEROBACTER AEROGENES    RADIOLOGY:  No results found.   Management plans discussed with the patient, family and they are in agreement.  CODE STATUS:     Code Status Orders        Start     Ordered   11/04/14 1750  Full code   Continuous     11/04/14 1750      TOTAL TIME TAKING CARE OF THIS PATIENT: 40 minutes.    Vaughan Basta M.D on 11/08/2014 at 11:57 AM  Between 7am to 6pm - Pager - [phone]  After 6pm go to [email or web address] - password [email or web address] [facility]  [facility]  Office  [phone]  CC: Primary care  physician; Keith Rake, MD

## 2014-11-08 NOTE — Progress Notes (Signed)
[facility] [place] NAME: Loretta Wade    MR#:  [id number]  DATE OF BIRTH:  [birth year]  SUBJECTIVE:  CHIEF COMPLAINT:   Chief Complaint  Patient presents with  . Shortness of Breath   shortness of breath is the same. Cough present without sputum.  C/o chest pain on and  Off- not realted to activities, PT suggest Home health.  REVIEW OF SYSTEMS:    Review of Systems  Constitutional: Positive for malaise/fatigue. Negative for fever and chills.  HENT: Negative for sore throat.   Eyes: Negative for blurred vision, double vision and pain.  Respiratory: Negative for cough, hemoptysis, sputum production, shortness of breath and wheezing.   Cardiovascular: Positive for chest pain. Negative for palpitations, orthopnea and leg swelling.  Gastrointestinal: Negative for heartburn, nausea, vomiting, abdominal pain, diarrhea and constipation.  Genitourinary: Negative for dysuria and hematuria.  Musculoskeletal: Negative for back pain and joint pain.  Skin: Negative for rash.  Neurological: Positive for weakness. Negative for sensory change, speech change, focal weakness and headaches.  Endo/Heme/Allergies: Does not bruise/bleed easily.  Psychiatric/Behavioral: Negative for depression. The patient is not nervous/anxious.     DRUG ALLERGIES:  No Known Allergies  VITALS:  Blood pressure 148/76, pulse 72, temperature 97.5 F (36.4 C), temperature source Oral, resp. rate 22, height 5\' 4"  (1.626 m), weight 158.94 kg (350 lb 6.4 oz), SpO2 94 %.  PHYSICAL EXAMINATION:   Physical Exam  GENERAL:  64 y.o.-year-old patient lying in the bed with no acute distress. Morbidly obese EYES: Pupils equal, round, reactive to light and accommodation. No scleral icterus. Extraocular muscles intact.  HEENT: Head atraumatic, normocephalic. Oropharynx and nasopharynx clear.  NECK:  Supple, no jugular venous distention. No thyroid enlargement, no  tenderness.  LUNGS: Increased work of breathing. Bilateral minimal wheezing and decreased air entry. CARDIOVASCULAR: S1, S2 normal. No murmurs, rubs, or gallops.  ABDOMEN: Soft, nontender, nondistended. Bowel sounds present. No organomegaly or mass.  EXTREMITIES: No cyanosis, clubbing. 1+ edema NEUROLOGIC: Cranial nerves II through XII are intact. No focal Motor or sensory deficits b/l.   PSYCHIATRIC: The patient is alert and oriented x 3.  SKIN: No obvious rash, lesion, or ulcer.    LABORATORY PANEL:   CBC  Recent Labs Lab 11/05/14 0526  WBC 4.6  HGB 11.8*  HCT 36.2  PLT 222   ------------------------------------------------------------------------------------------------------------------  Chemistries   Recent Labs Lab 11/08/14 0557  NA 141  K 4.1  CL 98*  CO2 36*  GLUCOSE 109*  BUN 34*  CREATININE 1.13*  CALCIUM 9.1   ------------------------------------------------------------------------------------------------------------------  Cardiac Enzymes  Recent Labs Lab 11/04/14 1154  TROPONINI <0.03   ------------------------------------------------------------------------------------------------------------------  RADIOLOGY:  No results found.   ASSESSMENT AND PLAN:   * Acute on chronic respiratory failure - some improvement Due to COPD exacerbation and acute on chronic diastolic congestive heart failure. Wean oxygen as tolerated.  on baseline 2 ltr o2 at home.  * COPD exacerbation - No improvement. Still has wheezing -IV steroids, Antibiotics - Scheduled Nebulizers - Inhalers -Wean O2 as tolerated- she is on 2 ltr oxygen at home.  * Acute on chronic diastolic congestive heart failure - IV Lasix, Beta blockers - Input and Output- had total 5 ltr negative fluid balance. - Monitor Bun/Cr and Potassium- stable. - Echo reviewed. - Cardiology consult called in due to chest pain complain- he suggested out pt work up as troponin and EKG are normal .  *  Chronic pain syndrome  Continue medications from home  * Morbid obesity with sleep apnea  * DVT prophylaxis with Lovenox  Discharge home today- with Home health and PT with visiting nurse at home. All the records are reviewed and case discussed with Care Management/Social Workerr. Management plans discussed with the patient and she is in agreement.  CODE STATUS: Full code  DVT Prophylaxis: SCDs  TOTAL TIME TAKING CARE OF THIS PATIENT: 35 minutes.     Vaughan Basta M.D on 11/08/2014 at 11:37 AM  Between 7am to 6pm - Pager - [phone]  After 6pm go to [email or web address] - password [email or web address] [facility]  [facility]  Office  [phone]  CC: Primary care physician; Keith Rake, MD

## 2014-11-10 ENCOUNTER — Telehealth: Payer: Self-pay | Admitting: Family Medicine

## 2014-11-10 ENCOUNTER — Other Ambulatory Visit: Payer: Self-pay | Admitting: *Deleted

## 2014-11-10 NOTE — Telephone Encounter (Signed)
Christy nurse from [facility] would like to know if you could send a referral for a CT of her neck. Pt has an appt on 11/12/14. Pt was in a lot of pain over the weekend and it seems to be going in her neck,jaw and down her arm.

## 2014-11-10 NOTE — Patient Outreach (Signed)
Transition of care (week 1, discharged 9/4):  Pt states cough started back, woke her up last night but was able to go back to sleep.  Pt states [facility] RN from [facility] came yesterday, was suppose to call MD today about get CT scan- has pain in left side of face going down to shoulder.  Pt states she had a bad experience yesterday, had diarrhea so bad, stopped taking Levaquin and Prednisone/only 2 medications she was taking.  Pt states HH RN came today, told her stopped taking the 2 medications, is suppose to call MD today and let him know. Pt states she does not have Hydrochlorothiazide or the nasal spray that is on discharge sheet to which RN CM discussed letting MD know at upcoming office visit 9/8.   Discussed with pt plan to f/u with scheduled home visit on 9/12 to which pt agreed.       Plan to f/u with pt again on 9/12 (home visit) as part of ongoing transition of care.     Loretta Wade.   [facility]  [phone]

## 2014-11-10 NOTE — Telephone Encounter (Signed)
Routed to [name] to review and order CT Scan

## 2014-11-11 ENCOUNTER — Telehealth: Payer: Self-pay | Admitting: Family Medicine

## 2014-11-11 NOTE — Telephone Encounter (Signed)
Routed to Dr. Shah °

## 2014-11-11 NOTE — Telephone Encounter (Signed)
Loretta Wade a caseworker from [facility] is requesting return call [phone] Ext [phone]. Would like to discuss the Hedis measure and several referrals. States it is okay for you to call her back this afternoon but she prefer a return call first thing in the morning.

## 2014-11-12 ENCOUNTER — Telehealth: Payer: Self-pay | Admitting: Family Medicine

## 2014-11-12 ENCOUNTER — Ambulatory Visit (INDEPENDENT_AMBULATORY_CARE_PROVIDER_SITE_OTHER): Payer: Medicare PPO | Admitting: Family Medicine

## 2014-11-12 ENCOUNTER — Encounter: Payer: Self-pay | Admitting: Family Medicine

## 2014-11-12 VITALS — BP 128/71 | HR 72 | Temp 98.4°F | Resp 18 | Ht 64.0 in | Wt 355.0 lb

## 2014-11-12 DIAGNOSIS — G8929 Other chronic pain: Secondary | ICD-10-CM | POA: Insufficient documentation

## 2014-11-12 DIAGNOSIS — I5033 Acute on chronic diastolic (congestive) heart failure: Secondary | ICD-10-CM

## 2014-11-12 DIAGNOSIS — M542 Cervicalgia: Secondary | ICD-10-CM | POA: Diagnosis not present

## 2014-11-12 DIAGNOSIS — I5032 Chronic diastolic (congestive) heart failure: Secondary | ICD-10-CM | POA: Insufficient documentation

## 2014-11-12 DIAGNOSIS — Z6841 Body Mass Index (BMI) 40.0 and over, adult: Secondary | ICD-10-CM

## 2014-11-12 DIAGNOSIS — J441 Chronic obstructive pulmonary disease with (acute) exacerbation: Secondary | ICD-10-CM | POA: Diagnosis not present

## 2014-11-12 MED ORDER — MOXIFLOXACIN HCL 400 MG PO TABS: 400.0000 mg | ORAL_TABLET | Freq: Every day | ORAL | Status: DC

## 2014-11-12 NOTE — Telephone Encounter (Signed)
ERRENOUS °

## 2014-11-12 NOTE — Telephone Encounter (Signed)
Discussed patient's symptoms of neck pain during today's office visit and ordered an x-ray of cervical spine as the first step towards evaluation. CT scan of the neck may be considered after x-rays are reviewed.

## 2014-11-12 NOTE — Progress Notes (Signed)
Name: Loretta Wade   MRN: [id number]    DOB: 1950/06/22   Date:11/12/2014       Progress Note  Subjective  Chief Complaint  Chief Complaint  Patient presents with  . Follow-up    3 mo chronic pain  . COPD  . Gastrophageal Reflux    HPI Pt. Is here for Hospital Follow up. She was admitted with respiratory failure and acute COPD exacerbation. Discharged on 11/08/14. She was started on levofloxacin at the hospital for COPD exacerbation but only took the first 3 days and it made her sick and she stopped taking it. She has taken Moxifloxacin in the past which did not make her sick. She is requesting a CT scan of her neck for a recurrent left sided pain in her anterior neck which travels down her left shoulder and arm. She describes her left arm as being weaker than the right. Pt.'s niece-in-law is accompanying her to this visit and she is requesting a referral to a weight loss clinic. Pt. Is agreeable to this.   Past Medical History  Diagnosis Date  . OA (osteoarthritis)   . Fibromyalgia   . Anemia   . Sleep apnea   . Fatigue   . Edema   . Esophageal reflux   . Nocturia   . Hypertension   . COPD (chronic obstructive pulmonary disease)     Past Surgical History  Procedure Laterality Date  . Cholecystectomy    . Total knee arthroplasty      right  . Vesicovaginal fistula closure w/ tah    . Abdominal hysterectomy      Family History  Problem Relation Age of Onset  . Hypertension Mother   . Diabetes    . Breast cancer Daughter     Social History   Social History  . Marital Status: Single    Spouse Name: N/A  . Number of Children: N/A  . Years of Education: N/A   Occupational History  . Not on file.   Social History Main Topics  . Smoking status: Former Smoker    Quit date: 11/04/1983  . Smokeless tobacco: Not on file  . Alcohol Use: No  . Drug Use: No  . Sexual Activity: Not on file   Other Topics Concern  . Not on file   Social History Narrative      Current outpatient prescriptions:  .  acetaminophen (TYLENOL) 500 MG tablet, Take 1,000 mg by mouth every 6 (six) hours as needed for mild pain or headache., Disp: , Rfl:  .  albuterol (PROVENTIL HFA;VENTOLIN HFA) 108 (90 BASE) MCG/ACT inhaler, Inhale 2 puffs into the lungs every 6 (six) hours as needed for wheezing or shortness of breath. , Disp: , Rfl:  .  azithromycin (ZITHROMAX) 250 MG tablet, take 2 tablets by mouth on day 1 then 1 tablet on days 2 through 5, Disp: , Rfl: 0 .  Chlorpheniramine-Acetaminophen (CORICIDIN HBP COLD/FLU PO), Take 2 tablets by mouth daily as needed (for nasal congestion/cough). , Disp: , Rfl:  .  cholecalciferol (VITAMIN D) 1000 UNITS tablet, Take 1,000 Units by mouth daily. , Disp: , Rfl:  .  dexlansoprazole (DEXILANT) 60 MG capsule, Take 60 mg by mouth daily., Disp: , Rfl:  .  etodolac (LODINE) 300 MG capsule, Take 300 mg by mouth every 8 (eight) hours., Disp: , Rfl:  .  Fluticasone Furoate-Vilanterol 100-25 MCG/INH AEPB, Inhale 1 puff into the lungs 2 (two) times daily. , Disp: , Rfl:  .  furosemide (LASIX) 20 MG tablet, Take 20 mg by mouth daily. , Disp: , Rfl:  .  guaiFENesin-dextromethorphan (ROBITUSSIN DM) 100-10 MG/5ML syrup, Take 10 mLs by mouth every 4 (four) hours as needed for cough., Disp: , Rfl:  .  hydrochlorothiazide (HYDRODIURIL) 25 MG tablet, Take 25 mg by mouth daily., Disp: , Rfl:  .  ipratropium-albuterol (DUONEB) 0.5-2.5 (3) MG/3ML SOLN, Take 3 mLs by nebulization every 6 (six) hours as needed (for shortness of breath). , Disp: , Rfl:  .  levofloxacin (LEVAQUIN) 750 MG tablet, Take 1 tablet (750 mg total) by mouth daily., Disp: 3 tablet, Rfl: 0 .  Magnesium 250 MG TABS, Take 250 mg by mouth daily. , Disp: , Rfl:  .  olmesartan (BENICAR) 20 MG tablet, Take 20 mg by mouth daily., Disp: , Rfl:  .  pantoprazole (PROTONIX) 40 MG tablet, Take 1 tablet (40 mg total) by mouth 2 (two) times daily., Disp: 60 tablet, Rfl: 0 .  predniSONE (STERAPRED  UNI-PAK 21 TAB) 10 MG (21) TBPK tablet, Take 1 tablet (10 mg total) by mouth daily. Take 6 tabs first day, 5 tab on day 2, then 4 on day 3rd, 3 tabs on day 4th , 2 tab on day 5th, and 1 tab on 6th day., Disp: 21 tablet, Rfl: 0 .  pregabalin (LYRICA) 150 MG capsule, Take 150 mg by mouth 2 (two) times daily., Disp: , Rfl:  .  propranolol ER (INDERAL LA) 80 MG 24 hr capsule, Take 1 capsule (80 mg total) by mouth daily., Disp: 90 capsule, Rfl: 0 .  rOPINIRole (REQUIP) 0.25 MG tablet, Take 3 tablets (0.75 mg total) by mouth at bedtime., Disp: 90 tablet, Rfl: 2 .  tiotropium (SPIRIVA) 18 MCG inhalation capsule, Place 18 mcg into inhaler and inhale daily., Disp: , Rfl:   No Known Allergies   Review of Systems  Constitutional: Negative for fever and chills.  Respiratory: Positive for cough and shortness of breath.   Cardiovascular: Negative for chest pain and palpitations.  Neurological: Negative for headaches.   Objective  Filed Vitals:   11/12/14 1041  BP: 128/71  Pulse: 72  Temp: 98.4 F (36.9 C)  TempSrc: Oral  Resp: 18  Height: 5\' 4"  (1.626 m)  Weight: 355 lb (161.027 kg)  SpO2: 90%    Physical Exam  Constitutional: Vital signs are normal.  Obese, in No acute distress. When standing up from her wheelchair, she is complaining of leg pain.  Cardiovascular: Normal rate and regular rhythm.   Pulmonary/Chest: Effort normal and breath sounds normal.  Abdominal: Soft.  Musculoskeletal: She exhibits edema.       Cervical back: She exhibits tenderness and pain.  Neurological: She is alert.  Nursing note and vitals reviewed.  Assessment & Plan  1. COPD exacerbation Started patient on Avelox for 5 days to finish the course of antibiotic provided at the hospital. Advised to continue using the nebulizer and the inhalers. Follow-up in 6 weeks. - moxifloxacin (AVELOX) 400 MG tablet; Take 1 tablet (400 mg total) by mouth daily at 8 pm.  Dispense: 5 tablet; Refill: 0  2. Diastolic CHF,  acute on chronic Continue on Lasix. Referral to cardiology provided. - Ambulatory referral to Cardiology  3. Morbid obesity with BMI of 60.0-69.9, adult  - Amb ref to Medical Nutrition Therapy-MNT  4. Neck pain, chronic Complaints of intermittent but recurrent left-sided radicular neck pain. Patient's home health nurse has requested a CT scan of the cervical spine. We discussed x-ray as  being the more appropriate first-line imaging modality. Patient is agreeable to x-rays. Follow-up after review of the x-ray of cervical spine. - DG Cervical Spine Complete; Future   Antwine Agosto Asad A. [facility] 11/12/2014 11:04 AM

## 2014-11-13 ENCOUNTER — Inpatient Hospital Stay
Admission: EM | Admit: 2014-11-13 | Discharge: 2014-11-19 | DRG: 683 | Disposition: A | Discharge status: Skilled Nursing Facility | Payer: Medicare PPO | Attending: Internal Medicine | Admitting: Internal Medicine

## 2014-11-13 ENCOUNTER — Emergency Department: Payer: Medicare PPO

## 2014-11-13 DIAGNOSIS — J441 Chronic obstructive pulmonary disease with (acute) exacerbation: Secondary | ICD-10-CM | POA: Diagnosis present

## 2014-11-13 DIAGNOSIS — M5410 Radiculopathy, site unspecified: Secondary | ICD-10-CM

## 2014-11-13 DIAGNOSIS — Z79899 Other long term (current) drug therapy: Secondary | ICD-10-CM | POA: Diagnosis not present

## 2014-11-13 DIAGNOSIS — M19019 Primary osteoarthritis, unspecified shoulder: Secondary | ICD-10-CM | POA: Diagnosis present

## 2014-11-13 DIAGNOSIS — G4733 Obstructive sleep apnea (adult) (pediatric): Secondary | ICD-10-CM | POA: Diagnosis present

## 2014-11-13 DIAGNOSIS — B9562 Methicillin resistant Staphylococcus aureus infection as the cause of diseases classified elsewhere: Secondary | ICD-10-CM | POA: Diagnosis present

## 2014-11-13 DIAGNOSIS — M7981 Nontraumatic hematoma of soft tissue: Secondary | ICD-10-CM | POA: Diagnosis present

## 2014-11-13 DIAGNOSIS — N3000 Acute cystitis without hematuria: Secondary | ICD-10-CM | POA: Diagnosis present

## 2014-11-13 DIAGNOSIS — R1084 Generalized abdominal pain: Secondary | ICD-10-CM

## 2014-11-13 DIAGNOSIS — R112 Nausea with vomiting, unspecified: Secondary | ICD-10-CM

## 2014-11-13 DIAGNOSIS — N170 Acute kidney failure with tubular necrosis: Secondary | ICD-10-CM | POA: Diagnosis not present

## 2014-11-13 DIAGNOSIS — Z9981 Dependence on supplemental oxygen: Secondary | ICD-10-CM

## 2014-11-13 DIAGNOSIS — Z6841 Body Mass Index (BMI) 40.0 and over, adult: Secondary | ICD-10-CM | POA: Diagnosis not present

## 2014-11-13 DIAGNOSIS — R32 Unspecified urinary incontinence: Secondary | ICD-10-CM | POA: Diagnosis present

## 2014-11-13 DIAGNOSIS — R531 Weakness: Secondary | ICD-10-CM

## 2014-11-13 DIAGNOSIS — E861 Hypovolemia: Secondary | ICD-10-CM | POA: Diagnosis present

## 2014-11-13 DIAGNOSIS — Z1389 Encounter for screening for other disorder: Secondary | ICD-10-CM

## 2014-11-13 DIAGNOSIS — I5032 Chronic diastolic (congestive) heart failure: Secondary | ICD-10-CM | POA: Diagnosis present

## 2014-11-13 DIAGNOSIS — E875 Hyperkalemia: Secondary | ICD-10-CM | POA: Diagnosis present

## 2014-11-13 DIAGNOSIS — K219 Gastro-esophageal reflux disease without esophagitis: Secondary | ICD-10-CM | POA: Diagnosis present

## 2014-11-13 DIAGNOSIS — M79662 Pain in left lower leg: Secondary | ICD-10-CM | POA: Diagnosis present

## 2014-11-13 DIAGNOSIS — J9610 Chronic respiratory failure, unspecified whether with hypoxia or hypercapnia: Secondary | ICD-10-CM | POA: Diagnosis present

## 2014-11-13 DIAGNOSIS — I10 Essential (primary) hypertension: Secondary | ICD-10-CM | POA: Diagnosis present

## 2014-11-13 DIAGNOSIS — Z7952 Long term (current) use of systemic steroids: Secondary | ICD-10-CM | POA: Diagnosis not present

## 2014-11-13 DIAGNOSIS — M545 Low back pain: Secondary | ICD-10-CM | POA: Diagnosis present

## 2014-11-13 DIAGNOSIS — E860 Dehydration: Secondary | ICD-10-CM

## 2014-11-13 DIAGNOSIS — I959 Hypotension, unspecified: Secondary | ICD-10-CM | POA: Diagnosis present

## 2014-11-13 DIAGNOSIS — M797 Fibromyalgia: Secondary | ICD-10-CM | POA: Diagnosis present

## 2014-11-13 DIAGNOSIS — R0600 Dyspnea, unspecified: Secondary | ICD-10-CM

## 2014-11-13 DIAGNOSIS — D649 Anemia, unspecified: Secondary | ICD-10-CM | POA: Diagnosis present

## 2014-11-13 DIAGNOSIS — Z9049 Acquired absence of other specified parts of digestive tract: Secondary | ICD-10-CM | POA: Diagnosis present

## 2014-11-13 DIAGNOSIS — J449 Chronic obstructive pulmonary disease, unspecified: Secondary | ICD-10-CM | POA: Diagnosis present

## 2014-11-13 DIAGNOSIS — M25519 Pain in unspecified shoulder: Secondary | ICD-10-CM

## 2014-11-13 DIAGNOSIS — Z0189 Encounter for other specified special examinations: Secondary | ICD-10-CM

## 2014-11-13 DIAGNOSIS — N179 Acute kidney failure, unspecified: Secondary | ICD-10-CM

## 2014-11-13 DIAGNOSIS — A4902 Methicillin resistant Staphylococcus aureus infection, unspecified site: Secondary | ICD-10-CM

## 2014-11-13 DIAGNOSIS — M1990 Unspecified osteoarthritis, unspecified site: Secondary | ICD-10-CM | POA: Diagnosis present

## 2014-11-13 DIAGNOSIS — Z96651 Presence of right artificial knee joint: Secondary | ICD-10-CM | POA: Diagnosis present

## 2014-11-13 DIAGNOSIS — Z87891 Personal history of nicotine dependence: Secondary | ICD-10-CM

## 2014-11-13 DIAGNOSIS — M25512 Pain in left shoulder: Secondary | ICD-10-CM

## 2014-11-13 LAB — BASIC METABOLIC PANEL
ANION GAP: 10 (ref 5–15)
Anion gap: 9 (ref 5–15)
BUN: 95 mg/dL — ABNORMAL HIGH (ref 6–20)
BUN: 85 mg/dL — AB (ref 6–20)
CHLORIDE: 100 mmol/L — AB (ref 101–111)
CHLORIDE: 104 mmol/L (ref 101–111)
CO2: 29 mmol/L (ref 22–32)
CO2: 26 mmol/L (ref 22–32)
Calcium: 8.9 mg/dL (ref 8.9–10.3)
Calcium: 8.8 mg/dL — ABNORMAL LOW (ref 8.9–10.3)
Creatinine, Ser: 4.88 mg/dL — ABNORMAL HIGH (ref 0.44–1.00)
Creatinine, Ser: 3.43 mg/dL — ABNORMAL HIGH (ref 0.44–1.00)
GFR calc Af Amer: 10 mL/min — ABNORMAL LOW (ref >60)
GFR calc non Af Amer: 9 mL/min — ABNORMAL LOW (ref >60)
GFR, EST AFRICAN AMERICAN: 15 mL/min — AB (ref >60)
GFR, EST NON AFRICAN AMERICAN: 13 mL/min — AB (ref >60)
GLUCOSE: 116 mg/dL — AB (ref 65–99)
Glucose, Bld: 110 mg/dL — ABNORMAL HIGH (ref 65–99)
POTASSIUM: 5 mmol/L (ref 3.5–5.1)
POTASSIUM: 5.6 mmol/L — AB (ref 3.5–5.1)
SODIUM: 139 mmol/L (ref 135–145)
Sodium: 139 mmol/L (ref 135–145)

## 2014-11-13 LAB — CBC
HEMATOCRIT: 35.8 % (ref 35.0–47.0)
HEMOGLOBIN: 11.4 g/dL — AB (ref 12.0–16.0)
MCH: 29.8 pg (ref 26.0–34.0)
MCHC: 31.8 g/dL — AB (ref 32.0–36.0)
MCV: 93.8 fL (ref 80.0–100.0)
Platelets: 200 10*3/uL (ref 150–440)
RBC: 3.81 MIL/uL (ref 3.80–5.20)
RDW: 14.8 % — ABNORMAL HIGH (ref 11.5–14.5)
WBC: 12.1 10*3/uL — ABNORMAL HIGH (ref 3.6–11.0)

## 2014-11-13 LAB — URINALYSIS COMPLETE WITH MICROSCOPIC (ARMC ONLY)
Bilirubin Urine: NEGATIVE
Glucose, UA: NEGATIVE mg/dL
Ketones, ur: NEGATIVE mg/dL
Nitrite: NEGATIVE
PH: 5 (ref 5.0–8.0)
PROTEIN: NEGATIVE mg/dL
SPECIFIC GRAVITY, URINE: 1.011 (ref 1.005–1.030)

## 2014-11-13 LAB — HEMOGLOBIN A1C: HEMOGLOBIN A1C: 5.7 % (ref 4.0–6.0)

## 2014-11-13 LAB — TROPONIN I

## 2014-11-13 LAB — LIPASE, BLOOD: Lipase: 19 U/L — ABNORMAL LOW (ref 22–51)

## 2014-11-13 MED ORDER — IOHEXOL 350 MG/ML SOLN: 100.0000 mL | Freq: Once | INTRAVENOUS | Status: DC | PRN

## 2014-11-13 MED ORDER — MAGNESIUM OXIDE 400 (241.3 MG) MG PO TABS
400.0000 mg | ORAL_TABLET | Freq: Every day | ORAL | Status: DC
  Administered 2014-11-13 – 2014-11-19 (×7): 400 mg via ORAL
  Filled 2014-11-13 (×7): qty 1

## 2014-11-13 MED ORDER — ALBUTEROL SULFATE HFA 108 (90 BASE) MCG/ACT IN AERS: 2.0000 | INHALATION_SPRAY | Freq: Four times a day (QID) | RESPIRATORY_TRACT | Status: DC | PRN

## 2014-11-13 MED ORDER — ALBUTEROL SULFATE (2.5 MG/3ML) 0.083% IN NEBU: 2.5000 mg | INHALATION_SOLUTION | Freq: Four times a day (QID) | RESPIRATORY_TRACT | Status: DC | PRN

## 2014-11-13 MED ORDER — GUAIFENESIN-DM 100-10 MG/5ML PO SYRP
10.0000 mL | ORAL_SOLUTION | ORAL | Status: DC | PRN
Start: 2014-11-13 — End: 2014-11-19
  Administered 2014-11-17 – 2014-11-18 (×3): 10 mL via ORAL
  Filled 2014-11-13 (×3): qty 10

## 2014-11-13 MED ORDER — PREGABALIN 75 MG PO CAPS
150.0000 mg | ORAL_CAPSULE | Freq: Two times a day (BID) | ORAL | Status: DC
  Administered 2014-11-13 – 2014-11-19 (×13): 150 mg via ORAL
  Filled 2014-11-13 (×13): qty 2

## 2014-11-13 MED ORDER — ROPINIROLE HCL 0.25 MG PO TABS
0.7500 mg | ORAL_TABLET | Freq: Every day | ORAL | Status: DC
  Administered 2014-11-13 – 2014-11-18 (×6): 0.75 mg via ORAL
  Filled 2014-11-13 (×6): qty 3

## 2014-11-13 MED ORDER — ACETAMINOPHEN 325 MG PO TABS
650.0000 mg | ORAL_TABLET | Freq: Four times a day (QID) | ORAL | Status: DC | PRN
  Administered 2014-11-14: 650 mg via ORAL
  Filled 2014-11-13: qty 2

## 2014-11-13 MED ORDER — SODIUM CHLORIDE 0.9 % IV BOLUS (SEPSIS)
500.0000 mL | Freq: Once | INTRAVENOUS | Status: AC
  Administered 2014-11-13: 500 mL via INTRAVENOUS

## 2014-11-13 MED ORDER — DOCUSATE SODIUM 100 MG PO CAPS
100.0000 mg | ORAL_CAPSULE | Freq: Two times a day (BID) | ORAL | Status: DC
  Administered 2014-11-13 – 2014-11-19 (×12): 100 mg via ORAL
  Filled 2014-11-13 (×13): qty 1

## 2014-11-13 MED ORDER — PANTOPRAZOLE SODIUM 40 MG PO TBEC
40.0000 mg | DELAYED_RELEASE_TABLET | Freq: Two times a day (BID) | ORAL | Status: DC
  Administered 2014-11-13 – 2014-11-19 (×13): 40 mg via ORAL
  Filled 2014-11-13 (×13): qty 1

## 2014-11-13 MED ORDER — ACETAMINOPHEN 650 MG RE SUPP: 650.0000 mg | Freq: Four times a day (QID) | RECTAL | Status: DC | PRN

## 2014-11-13 MED ORDER — IOHEXOL 240 MG/ML SOLN
25.0000 mL | Freq: Once | INTRAMUSCULAR | Status: AC | PRN
  Administered 2014-11-13: 25 mL via ORAL

## 2014-11-13 MED ORDER — FLUTICASONE FUROATE-VILANTEROL 100-25 MCG/INH IN AEPB: 1.0000 | INHALATION_SPRAY | Freq: Two times a day (BID) | RESPIRATORY_TRACT | Status: DC

## 2014-11-13 MED ORDER — ACETAMINOPHEN 500 MG PO TABS
1000.0000 mg | ORAL_TABLET | Freq: Four times a day (QID) | ORAL | Status: DC | PRN
  Administered 2014-11-19: 1000 mg via ORAL
  Filled 2014-11-13: qty 2

## 2014-11-13 MED ORDER — SODIUM CHLORIDE 0.9 % IV SOLN
INTRAVENOUS | Status: DC
  Administered 2014-11-13 – 2014-11-15 (×4): via INTRAVENOUS

## 2014-11-13 MED ORDER — TIOTROPIUM BROMIDE MONOHYDRATE 18 MCG IN CAPS
18.0000 ug | ORAL_CAPSULE | Freq: Every day | RESPIRATORY_TRACT | Status: DC
  Administered 2014-11-13 – 2014-11-19 (×7): 18 ug via RESPIRATORY_TRACT
  Filled 2014-11-13 (×2): qty 5

## 2014-11-13 MED ORDER — CHLORPHENIRAMINE-ACETAMINOPHEN 2-325 MG PO TABS
ORAL_TABLET | Freq: Every day | ORAL | Status: DC | PRN
  Filled 2014-11-13: qty 1

## 2014-11-13 MED ORDER — HYDROCODONE-ACETAMINOPHEN 5-325 MG PO TABS
1.0000 | ORAL_TABLET | ORAL | Status: DC | PRN
  Administered 2014-11-14 (×2): 2 via ORAL
  Administered 2014-11-15 – 2014-11-16 (×2): 1 via ORAL
  Administered 2014-11-17: 2 via ORAL
  Administered 2014-11-17 – 2014-11-19 (×6): 1 via ORAL
  Filled 2014-11-13: qty 1
  Filled 2014-11-13: qty 2
  Filled 2014-11-13 (×2): qty 1
  Filled 2014-11-13: qty 2
  Filled 2014-11-13 (×7): qty 1
  Filled 2014-11-13 (×2): qty 2
  Filled 2014-11-13: qty 1

## 2014-11-13 MED ORDER — MAGNESIUM OXIDE 250 MG PO TABS
250.0000 mg | ORAL_TABLET | Freq: Every day | ORAL | Status: DC
  Filled 2014-11-13: qty 1

## 2014-11-13 MED ORDER — IPRATROPIUM-ALBUTEROL 0.5-2.5 (3) MG/3ML IN SOLN: 3.0000 mL | Freq: Four times a day (QID) | RESPIRATORY_TRACT | Status: DC | PRN

## 2014-11-13 MED ORDER — SODIUM CHLORIDE 0.9 % IJ SOLN
3.0000 mL | Freq: Two times a day (BID) | INTRAMUSCULAR | Status: DC
  Administered 2014-11-13 – 2014-11-18 (×11): 3 mL via INTRAVENOUS

## 2014-11-13 NOTE — H&P (Addendum)
[facility] [place] NAME: Loretta Wade    MR#:  [id number]  DATE OF BIRTH:  February 27, 1951  DATE OF ADMISSION:  11/13/2014  PRIMARY CARE PHYSICIAN: Keith Rake, MD   REQUESTING/REFERRING PHYSICIAN:   CHIEF COMPLAINT:   Chief Complaint  Patient presents with  . Weakness  . Urinary Incontinence  . Abdominal Pain    HISTORY OF PRESENT ILLNESS: Loretta Wade  is a 64 y.o. female with a known history of COPD, chronic respiratory failure on 1-2 L of oxygen through nasal cannula at home. History of congestive heart failure who presents to the hospital with complaints of weakness. The patient was admitted to the hospital for COPD exacerbation for 11/04/2014 through 11/08/2014. She felt relatively well after discharge from the hospital. However, yesterday she started having shakiness in her hands and today in the morning she became incontinent and that he week. She also had severe left-sided abdominal pain and she was not able even to get out from the bed. She felt very dizzy. On arrival to emergency room today. She was noted to be hypotensive with systolic blood pressure 92, she was also noted to be in acute renal failure with creatinine level of 4.88. Urinalysis was not consistent with urinary tract infection. Patient underwent CT scan of abdomen and pelvis without contrast which was unremarkable. Hospitalist services were contacted for admission  PAST MEDICAL HISTORY:   Past Medical History  Diagnosis Date  . OA (osteoarthritis)   . Fibromyalgia   . Anemia   . Sleep apnea   . Fatigue   . Edema   . Esophageal reflux   . Nocturia   . Hypertension   . COPD (chronic obstructive pulmonary disease)     PAST SURGICAL HISTORY:  Past Surgical History  Procedure Laterality Date  . Cholecystectomy    . Total knee arthroplasty      right  . Vesicovaginal fistula closure w/ tah    . Abdominal hysterectomy      SOCIAL HISTORY:  Social  History  Substance Use Topics  . Smoking status: Former Smoker    Quit date: 11/04/1983  . Smokeless tobacco: Not on file  . Alcohol Use: No    FAMILY HISTORY:  Family History  Problem Relation Age of Onset  . Hypertension Mother   . Diabetes    . Breast cancer Daughter     DRUG ALLERGIES: No Known Allergies  Review of Systems  Constitutional: Positive for malaise/fatigue. Negative for fever, chills and weight loss.  HENT: Positive for congestion.   Eyes: Negative for blurred vision and double vision.  Respiratory: Positive for cough, sputum production, shortness of breath and wheezing.   Cardiovascular: Negative for chest pain, palpitations, orthopnea, leg swelling and PND.  Gastrointestinal: Positive for abdominal pain, constipation and blood in stool. Negative for nausea, vomiting, diarrhea and melena.  Genitourinary: Negative for dysuria, urgency, frequency and hematuria.  Musculoskeletal: Negative for falls.  Skin: Negative for rash.  Neurological: Positive for tremors and weakness. Negative for dizziness.  Psychiatric/Behavioral: Negative for depression and memory loss. The patient is not nervous/anxious.    also dizziness, nausea, incontinence of urine, some chills twitching muscles and shakiness in the hands  MEDICATIONS AT HOME:  Prior to Admission medications   Medication Sig Start Date End Date Taking? Authorizing Provider  acetaminophen (TYLENOL) 500 MG tablet Take 1,000 mg by mouth every 6 (six) hours as needed for mild pain or headache.   Yes Historical  Provider, MD  albuterol (PROVENTIL HFA;VENTOLIN HFA) 108 (90 BASE) MCG/ACT inhaler Inhale 2 puffs into the lungs every 6 (six) hours as needed for wheezing or shortness of breath.    Yes Historical Provider, MD  Chlorpheniramine-Acetaminophen (CORICIDIN HBP COLD/FLU PO) Take 2 tablets by mouth daily as needed (for nasal congestion/cough).    Yes Historical Provider, MD  cholecalciferol (VITAMIN D) 1000 UNITS tablet  Take 1,000 Units by mouth daily.    Yes Historical Provider, MD  dexlansoprazole (DEXILANT) 60 MG capsule Take 60 mg by mouth daily.   Yes Historical Provider, MD  etodolac (LODINE) 300 MG capsule Take 300 mg by mouth every 8 (eight) hours.   Yes Historical Provider, MD  Fluticasone Furoate-Vilanterol 100-25 MCG/INH AEPB Inhale 1 puff into the lungs 2 (two) times daily.    Yes Historical Provider, MD  furosemide (LASIX) 20 MG tablet Take 20 mg by mouth daily.    Yes Historical Provider, MD  guaiFENesin-dextromethorphan (ROBITUSSIN DM) 100-10 MG/5ML syrup Take 10 mLs by mouth every 4 (four) hours as needed for cough.   Yes Historical Provider, MD  hydrochlorothiazide (HYDRODIURIL) 25 MG tablet Take 25 mg by mouth daily.   Yes Historical Provider, MD  ipratropium-albuterol (DUONEB) 0.5-2.5 (3) MG/3ML SOLN Take 3 mLs by nebulization every 6 (six) hours as needed (for shortness of breath).    Yes Historical Provider, MD  Magnesium Oxide 250 MG TABS Take 250 mg by mouth daily.   Yes Historical Provider, MD  olmesartan (BENICAR) 20 MG tablet Take 20 mg by mouth daily.   Yes Historical Provider, MD  pantoprazole (PROTONIX) 40 MG tablet Take 1 tablet (40 mg total) by mouth 2 (two) times daily. 11/08/14  Yes Vaughan Basta, MD  predniSONE (STERAPRED UNI-PAK 21 TAB) 10 MG (21) TBPK tablet Take 1 tablet (10 mg total) by mouth daily. Take 6 tabs first day, 5 tab on day 2, then 4 on day 3rd, 3 tabs on day 4th , 2 tab on day 5th, and 1 tab on 6th day. 11/08/14  Yes Vaughan Basta, MD  pregabalin (LYRICA) 150 MG capsule Take 150 mg by mouth 2 (two) times daily.   Yes Historical Provider, MD  propranolol ER (INDERAL LA) 80 MG 24 hr capsule Take 1 capsule (80 mg total) by mouth daily. 10/26/14  Yes Roselee Nova, MD  rOPINIRole (REQUIP) 0.25 MG tablet Take 3 tablets (0.75 mg total) by mouth at bedtime. 10/22/14  Yes Roselee Nova, MD  tiotropium (SPIRIVA) 18 MCG inhalation capsule Place 18 mcg into  inhaler and inhale daily.   Yes Historical Provider, MD  moxifloxacin (AVELOX) 400 MG tablet Take 1 tablet (400 mg total) by mouth daily at 8 pm. Patient not taking: Reported on 11/13/2014 11/12/14   Roselee Nova, MD      PHYSICAL EXAMINATION:   VITAL SIGNS: Blood pressure 95/44, pulse 68, temperature 99.5 F (37.5 C), temperature source Oral, resp. rate 16, height 5\' 4"  (1.626 m), weight 162.388 kg (358 lb), SpO2 91 %.  GENERAL:  64 y.o.-year-old morbidly obese patient lying in the bed with no acute distress. She has intermittent myoclonus, and upper extremities, intermittent dry cough EYES: Pupils equal, round, reactive to light and accommodation. No scleral icterus. Extraocular muscles intact.  HEENT: Head atraumatic, normocephalic. Oropharynx and nasopharynx clear.  NECK:  Supple, no jugular venous distention. No thyroid enlargement, no tenderness.  LUNGS: Normal breath sounds bilaterally, few anterior wheezing, mostly on the right , no  rales,rhonchi  or crepitation. No use of accessory muscles of respiration.  CARDIOVASCULAR: S1, S2 normal. No murmurs, rubs, or gallops.  ABDOMEN: Soft, nontender, nondistended. Bowel sounds present. No organomegaly or mass. Subcutaneous bruising was noted in left lower quadrant which is tender to palpation EXTREMITIES: No pedal edema, cyanosis, or clubbing.  NEUROLOGIC: Cranial nerves II through XII are intact. Muscle strength 5/5 in all extremities. Sensation intact. Gait not checked.  PSYCHIATRIC: The patient is alert and oriented x 3.  SKIN: No obvious rash, lesion, or ulcer.   LABORATORY PANEL:   CBC  Recent Labs Lab 11/13/14 0930  WBC 12.1*  HGB 11.4*  HCT 35.8  PLT 200  MCV 93.8  MCH 29.8  MCHC 31.8*  RDW 14.8*   ------------------------------------------------------------------------------------------------------------------  Chemistries   Recent Labs Lab 11/13/14 0930  NA 139  K 5.0  CL 100*  CO2 29  GLUCOSE 116*  BUN  95*  CREATININE 4.88*  CALCIUM 8.9   ------------------------------------------------------------------------------------------------------------------  Cardiac Enzymes  Recent Labs Lab 11/13/14 0930  TROPONINI <0.03   ------------------------------------------------------------------------------------------------------------------  RADIOLOGY: Ct Abdomen Pelvis Wo Contrast  11/13/2014   CLINICAL DATA:  Abdominal pain and urinary incontinence  EXAM: CT ABDOMEN AND PELVIS WITHOUT CONTRAST  TECHNIQUE: Multidetector CT imaging of the abdomen and pelvis was performed following the standard protocol without IV contrast. Oral contrast was administered.  COMPARISON:  April 27, 2014  FINDINGS: There is patchy bibasilar atelectasis, slightly more on the left than on the right.  No focal liver lesions are identified on this noncontrast enhanced study. Gallbladder is absent. There is no appreciable biliary duct dilatation.  Spleen, pancreas, and adrenals appear normal.  There is a cyst arising from the left kidney measuring 9.0 x 8.6 x 5.9 cm. There is mild scarring in the right kidney. There is no hydronephrosis on either side. There is no renal or ureteral calculus on either side. There are multiple small phleboliths which are near but separate from the ureters, particularly on the left side.  In the pelvis, the urinary bladder is midline with normal wall thickness. Uterus is absent. There is no pelvic mass or pelvic fluid collection. Appendix appears within normal limits.  There is no bowel obstruction. No free air or portal venous air. There is no appreciable ascites, adenopathy, or abscess in the abdomen or pelvis. There is atherosclerotic change in the aorta but no aneurysm. There is degenerative change in the lumbar spine. There are no blastic or lytic bone lesions.  IMPRESSION: No bowel obstruction. No bowel wall or mesenteric thickening. No abscess. Appendix appears normal.  Gallbladder and uterus  absent. No renal or ureteral calculus. No hydronephrosis. Persistent large cyst arising from left kidney.   Electronically Signed   By: Lowella Grip III M.D.   On: 11/13/2014 11:13    EKG: Orders placed or performed during the hospital encounter of 11/13/14  . ED EKG  . ED EKG  . EKG 12-Lead  . EKG 12-Lead  . ED EKG  . ED EKG    IMPRESSION AND PLAN:  Principal Problem:   ARF (acute renal failure) 1. Acute renal failure date due to ATN likely, no obvious urinary tract infection. Admit patient medical floor. Initiate patient on low rate IV fluids and nephrology consultation , no hydronephrosis on CT scan of the abdomen. Suspect diuretics as well as ARB's 2. COPD exacerbation. Patient completed levofloxacin/Avelox course, continue nebulizers as needed  3. Chronic diastolic CHF. Patient is volume depleted at present, we'll need to initiate IV  fluids and follow patient clinically for fluid overload 4. Left lower quadrant abdominal pain, likely due to subcutaneous hematoma. Hold heparin for now,  Just teds and SCDs 5. Hypotension. Hold blood pressure medications, resume them when blood pressure readings are high   All the records are reviewed and case discussed with ED provider. Management plans discussed with the patient, family and they are in agreement.  CODE STATUS:  Full code  TOTAL TIME TAKING CARE OF THIS PATIENT: 60 minutes.    Theodoro Grist M.D on 11/13/2014 at 12:07 PM  Between 7am to 6pm - Pager - [phone] After 6pm go to [email or web address] - [email or web address] [facility]  [facility]  Office  [phone]  CC: Primary care physician; Keith Rake, MD

## 2014-11-13 NOTE — ED Notes (Signed)
Pt on home 02 1L [place]

## 2014-11-13 NOTE — ED Provider Notes (Signed)
[facility] Emergency Department Provider Note REMINDER - THIS NOTE IS NOT A FINAL MEDICAL RECORD UNTIL IT IS SIGNED. UNTIL THEN, THE CONTENT BELOW MAY REFLECT INFORMATION FROM A DOCUMENTATION TEMPLATE, NOT THE ACTUAL PATIENT VISIT. ____________________________________________  Time seen: Approximately 9:34 AM  I have reviewed the triage vital signs and the nursing notes.   HISTORY  Chief Complaint Weakness; Urinary Incontinence; and Abdominal Pain    HPI Loretta Wade is a 64 y.o. female with whom I have seen in the emergency room recently. She has a history of COPD, congestive heart failure, and recently placed on moxifloxacin yesterday. She is also has severe obesity. Loretta Wade reports that today when she woke up she's been feeling very weak and generally fatigued, so severe to the point that she is unable to stand up on her own or get herself up to her walker. She does report having some moderate left-sided abdominal pain, and also urinary incontinence that began roughly at 5 AM. She has no specific weakness in one side one arm or one leg. No facial droop. No numbness or tingling. She denies any chest pain. She does have a chronic cough, but this is slowly improving from her previous admission. She reports using 2 L of nasal cannula at home at baseline.  She reports loose stools for the last 1-2 days. No bloody stools. No vomiting. Eating normally yesterday, feeling slightly weak but able to get to her doctor's office yesterday. She does use a walker.  Patient reports me that she did notify her doctor yesterday at her appointment she was having pain in her left side of the abdomen as well, but no further action was taken by the physician at that time.  Past Medical History  Diagnosis Date  . OA (osteoarthritis)   . Fibromyalgia   . Anemia   . Sleep apnea   . Fatigue   . Edema   . Esophageal reflux   . Nocturia   . Hypertension   . COPD (chronic  obstructive pulmonary disease)     Patient Active Problem List   Diagnosis Date Noted  . Diastolic CHF, acute on chronic 11/12/2014  . Morbid obesity with BMI of 50.0-59.9, adult 11/12/2014  . Neck pain, chronic 11/12/2014  . COPD exacerbation 11/04/2014  . Urinary tract infection 10/02/2014    Past Surgical History  Procedure Laterality Date  . Cholecystectomy    . Total knee arthroplasty      right  . Vesicovaginal fistula closure w/ tah    . Abdominal hysterectomy      Current Outpatient Rx  Name  Route  Sig  Dispense  Refill  . acetaminophen (TYLENOL) 500 MG tablet   Oral   Take 1,000 mg by mouth every 6 (six) hours as needed for mild pain or headache.         . albuterol (PROVENTIL HFA;VENTOLIN HFA) 108 (90 BASE) MCG/ACT inhaler   Inhalation   Inhale 2 puffs into the lungs every 6 (six) hours as needed for wheezing or shortness of breath.          . Chlorpheniramine-Acetaminophen (CORICIDIN HBP COLD/FLU PO)   Oral   Take 2 tablets by mouth daily as needed (for nasal congestion/cough).          . cholecalciferol (VITAMIN D) 1000 UNITS tablet   Oral   Take 1,000 Units by mouth daily.          Marland Kitchen dexlansoprazole (DEXILANT) 60 MG capsule  Oral   Take 60 mg by mouth daily.         Marland Kitchen etodolac (LODINE) 300 MG capsule   Oral   Take 300 mg by mouth every 8 (eight) hours.         . Fluticasone Furoate-Vilanterol 100-25 MCG/INH AEPB   Inhalation   Inhale 1 puff into the lungs 2 (two) times daily.          . furosemide (LASIX) 20 MG tablet   Oral   Take 20 mg by mouth daily.          Marland Kitchen guaiFENesin-dextromethorphan (ROBITUSSIN DM) 100-10 MG/5ML syrup   Oral   Take 10 mLs by mouth every 4 (four) hours as needed for cough.         . hydrochlorothiazide (HYDRODIURIL) 25 MG tablet   Oral   Take 25 mg by mouth daily.         Marland Kitchen ipratropium-albuterol (DUONEB) 0.5-2.5 (3) MG/3ML SOLN   Nebulization   Take 3 mLs by nebulization every 6 (six) hours  as needed (for shortness of breath).          . Magnesium Oxide 250 MG TABS   Oral   Take 250 mg by mouth daily.         Marland Kitchen olmesartan (BENICAR) 20 MG tablet   Oral   Take 20 mg by mouth daily.         . pantoprazole (PROTONIX) 40 MG tablet   Oral   Take 1 tablet (40 mg total) by mouth 2 (two) times daily.   60 tablet   0   . predniSONE (STERAPRED UNI-PAK 21 TAB) 10 MG (21) TBPK tablet   Oral   Take 1 tablet (10 mg total) by mouth daily. Take 6 tabs first day, 5 tab on day 2, then 4 on day 3rd, 3 tabs on day 4th , 2 tab on day 5th, and 1 tab on 6th day.   21 tablet   0   . pregabalin (LYRICA) 150 MG capsule   Oral   Take 150 mg by mouth 2 (two) times daily.         . propranolol ER (INDERAL LA) 80 MG 24 hr capsule   Oral   Take 1 capsule (80 mg total) by mouth daily.   90 capsule   0   . rOPINIRole (REQUIP) 0.25 MG tablet   Oral   Take 3 tablets (0.75 mg total) by mouth at bedtime.   90 tablet   2   . tiotropium (SPIRIVA) 18 MCG inhalation capsule   Inhalation   Place 18 mcg into inhaler and inhale daily.         Marland Kitchen moxifloxacin (AVELOX) 400 MG tablet   Oral   Take 1 tablet (400 mg total) by mouth daily at 8 pm. Patient not taking: Reported on 11/13/2014   5 tablet   0     Allergies Review of patient's allergies indicates no known allergies.  Family History  Problem Relation Age of Onset  . Hypertension Mother   . Diabetes    . Breast cancer Daughter     Social History Social History  Substance Use Topics  . Smoking status: Former Smoker    Quit date: 11/04/1983  . Smokeless tobacco: Not on file  . Alcohol Use: No    Review of Systems Constitutional: No fever/chills Eyes: No visual changes. ENT: No sore throat. Cardiovascular: Denies chest pain. Respiratory: Denies shortness of breath except for  chronic mild shortness of breath. She doesn't a slight dry cough which is improved significantly from previous. Gastrointestinal: Moderate  left-sided abdominal pain the left upper and left lower abdomen.  No nausea, no vomiting. Incontinence of urine, occasional loose stools for the last 1-2 days.  No constipation. Genitourinary: Negative for dysuria. Musculoskeletal: Negative for back pain. Skin: Negative for rash. Neurological: Negative for headaches, focal weakness or numbness.  10-point ROS otherwise negative.  ____________________________________________   PHYSICAL EXAM:  VITAL SIGNS: ED Triage Vitals  Enc Vitals Group     BP 11/13/14 0921 95/81 mmHg     Pulse Rate 11/13/14 0921 76     Resp 11/13/14 0921 20     Temp 11/13/14 0921 99.5 F (37.5 C)     Temp Source 11/13/14 0921 Oral     SpO2 11/13/14 0921 94 %     Weight 11/13/14 0921 358 lb (162.388 kg)     Height 11/13/14 0921 5\' 4"  (1.626 m)     Head Cir --      Peak Flow --      Pain Score 11/13/14 0922 8     Pain Loc --      Pain Edu? --      Excl. in [place]? --    Constitutional: Alert and oriented. Well appearing and in no acute distress. The patient is markedly obese. Eyes: Conjunctivae are normal. PERRL. EOMI. Head: Atraumatic. Nose: No congestion/rhinnorhea. Mouth/Throat: Mucous membranes are moist.  Oropharynx non-erythematous. Neck: No stridor.   Cardiovascular: Normal rate, regular rhythm. Grossly normal heart sounds.  Good peripheral circulation. Respiratory: Normal respiratory effort.  No retractions. Lungs CTAB. Please note that lung sounds are somewhat diminished due to the patient's obesity, but no wheezing is heard. She is speaking in normal sentences. Gastrointestinal: Very obese, she does have moderate tenderness to palpation in the left upper and left lower quadrant without obvious rebound or guarding. There is no hernia seen. No abdominal bruits. No CVA tenderness. Musculoskeletal: 2+ lower extremity edema bilaterally. No joint effusions. Patient demonstrates approximately 2 out of 5 strength in the lower extremity bilaterally, 5 out of 5  strength in the upper sternum is bilaterally. Patient does report that she has a long-standing history of weakness in the lower legs, but this does seem to be increased versus previous. There is no focal deficit. Neurologic:  Normal speech and language. No gross focal neurologic deficits are appreciated. Skin:  Skin is warm, dry and intact. No rash noted. Psychiatric: Mood and affect are normal. Speech and behavior are normal.  ____________________________________________   LABS (all labs ordered are listed, but only abnormal results are displayed)  Labs Reviewed  BASIC METABOLIC PANEL - Abnormal; Notable for the following:    Chloride 100 (*)    Glucose, Bld 116 (*)    BUN 95 (*)    Creatinine, Ser 4.88 (*)    GFR calc non Af Amer 9 (*)    GFR calc Af Amer 10 (*)    All other components within normal limits  CBC - Abnormal; Notable for the following:    WBC 12.1 (*)    Hemoglobin 11.4 (*)    MCHC 31.8 (*)    RDW 14.8 (*)    All other components within normal limits  URINALYSIS COMPLETEWITH MICROSCOPIC ([facility] ONLY) - Abnormal; Notable for the following:    Color, Urine YELLOW (*)    APPearance CLEAR (*)    Hgb urine dipstick 2+ (*)    Leukocytes, UA  TRACE (*)    Bacteria, UA RARE (*)    Squamous Epithelial / LPF 0-5 (*)    All other components within normal limits  LIPASE, BLOOD - Abnormal; Notable for the following:    Lipase 19 (*)    All other components within normal limits  TROPONIN I  CBG MONITORING, ED   ____________________________________________  EKG  Reviewed and interpreted by me ED ECG REPORT I, Sharika Mosquera, the attending physician, personally viewed and interpreted this ECG.  Date: 11/13/2014 EKG Time: 9:30 Rate: 75 Rhythm: normal sinus rhythm QRS Axis: normal Intervals: normal ST/T Wave abnormalities: normal Conduction Disutrbances: none Narrative Interpretation: unremarkable aside from slightly low voltage which is likely due to size of the  patient's chest wall  ____________________________________________  RADIOLOGY  CT Abdomen Pelvis Wo Contrast (Final result) Result time: 11/13/14 11:13:40   Procedure changed from CT Abdomen Pelvis W Contrast      Final result by Rad Results In Interface (11/13/14 11:13:40)   Narrative:   CLINICAL DATA: Abdominal pain and urinary incontinence  EXAM: CT ABDOMEN AND PELVIS WITHOUT CONTRAST  TECHNIQUE: Multidetector CT imaging of the abdomen and pelvis was performed following the standard protocol without IV contrast. Oral contrast was administered.  COMPARISON: April 27, 2014  FINDINGS: There is patchy bibasilar atelectasis, slightly more on the left than on the right.  No focal liver lesions are identified on this noncontrast enhanced study. Gallbladder is absent. There is no appreciable biliary duct dilatation.  Spleen, pancreas, and adrenals appear normal.  There is a cyst arising from the left kidney measuring 9.0 x 8.6 x 5.9 cm. There is mild scarring in the right kidney. There is no hydronephrosis on either side. There is no renal or ureteral calculus on either side. There are multiple small phleboliths which are near but separate from the ureters, particularly on the left side.  In the pelvis, the urinary bladder is midline with normal wall thickness. Uterus is absent. There is no pelvic mass or pelvic fluid collection. Appendix appears within normal limits.  There is no bowel obstruction. No free air or portal venous air. There is no appreciable ascites, adenopathy, or abscess in the abdomen or pelvis. There is atherosclerotic change in the aorta but no aneurysm. There is degenerative change in the lumbar spine. There are no blastic or lytic bone lesions.  IMPRESSION: No bowel obstruction. No bowel wall or mesenteric thickening. No abscess. Appendix appears normal.  Gallbladder and uterus absent. No renal or ureteral calculus. No hydronephrosis.  Persistent large cyst arising from left kidney.     ____________________________________________   PROCEDURES  Procedure(s) performed: None  Critical Care performed: No  ____________________________________________   INITIAL IMPRESSION / ASSESSMENT AND PLAN / ED COURSE  Pertinent labs & imaging results that were available during my care of the patient were reviewed by me and considered in my medical decision making (see chart for details).  This [place] presents with generalized weakness, urinary incontinence for approximately the last 6 hours, increased difficulty with ambulation and unable to ambulate today. She denies any back pain. She has no focal neurologic deficits. She has no numbness in the lower extremities. She does report left-sided abdominal pain which is been ongoing for approximately 24+ hours with occasional loose stool. Patient is currently on moxifloxacin for a COPD exacerbation was recently discharged from the hospital.  The patient does not complain of any acute cardiopulmonary symptoms, her primary concern is weakness of the lower legs, incontinence and left-sided abdominal pain.  The size the patient does inhibit her exam some, and we will pursue CT imaging of the abdomen and pelvis showed the patient be able to fit through the [place].  ----------------------------------------- 10:57 AM on 11/13/2014 -----------------------------------------  Patient's blood pressure mild to moderate hypotension. Patient currently receiving remainder of 500 mL bolus, we'll write for an additional 500 mL's. Based on the patient's significantly elevated creatinine and acute kidney injury I am suspecting patient has a fair amount of dehydration, and should improve blood pressures with fluids. Anticipate admission to hospital once CT scan is resulted. ____________________________________________   FINAL CLINICAL IMPRESSION(S) / ED DIAGNOSES  Final diagnoses:  Abdominal pain,  generalized  Nausea & vomiting      Delman Kitten, MD 11/13/14 1147

## 2014-11-13 NOTE — ED Notes (Signed)
Pt repositioned, pillows placed under left side

## 2014-11-13 NOTE — ED Notes (Signed)
43 yof presents to ED with left sided weakness, abdominal pain and urinary incontinence. Patient was admitted for bronchitis last Wednesday and was discharged on Sunday. This morning at 0500 patient awoke with LLQ abdominal pain. Pt states her last bowel movement was yesterday. Abdomen tender on palpation. Patient states she has been progressively weak since hospital admission but this morning it got worse.

## 2014-11-13 NOTE — Consult Note (Signed)
[facility]  CONSULT NOTE    Date: 11/13/2014                  Patient Name:  Loretta Wade  MRN: [id number]  DOB: Mar 20, 1950  Age / Sex: 64 y.o., female         PCP: Brayton El, MD                 Service Requesting Consult: Dr. Winona Legato                 Reason for Consult: Acute Renal Failure            History of Present Illness: Loretta Wade is a 64 y.o.  female with COPD, hypertension, obstructive sleep apnea not on CPAP, fibromyalgia, anemia, GERD, hypertension, morbid obesity who was admitted to [facility] on 11/13/2014 for Abdominal pain, generalized [R10.84] Dehydration [E86.0] Nausea & vomiting [R11.2] Acute kidney injury [N17.9]  Patient was admitted to [facility] from 8/31 to 9/4 for acute exacerbation of COPD. Patient was treated with oxygen, steroids, antibiotics, nebs and returned to baseline of 2 litres [place]. No IV contrast exposure.   Patient states she is eating and drinking well. Denies nausea/vomiting or diarrhea. She does not feel dehydrate. On admission, found to have a creatinine 4.88. Started on IV fluids.   Patient states she is urine incontinent.   Medications: Outpatient medications: Prescriptions prior to admission  Medication Sig Dispense Refill Last Dose  . acetaminophen (TYLENOL) 500 MG tablet Take 1,000 mg by mouth every 6 (six) hours as needed for mild pain or headache.   PRN  . albuterol (PROVENTIL HFA;VENTOLIN HFA) 108 (90 BASE) MCG/ACT inhaler Inhale 2 puffs into the lungs every 6 (six) hours as needed for wheezing or shortness of breath.    PRN  . Chlorpheniramine-Acetaminophen (CORICIDIN HBP COLD/FLU PO) Take 2 tablets by mouth daily as needed (for nasal congestion/cough).    PRN  . cholecalciferol (VITAMIN D) 1000 UNITS tablet Take 1,000 Units by mouth daily.    11/13/2014 at am  . dexlansoprazole (DEXILANT) 60 MG capsule Take 60 mg by mouth daily.   11/13/2014 at am  . etodolac (LODINE) 300 MG capsule Take 300 mg by mouth  every 8 (eight) hours.   11/13/2014 at am  . Fluticasone Furoate-Vilanterol 100-25 MCG/INH AEPB Inhale 1 puff into the lungs 2 (two) times daily.    11/13/2014 at am  . furosemide (LASIX) 20 MG tablet Take 20 mg by mouth daily.    11/13/2014 at am  . guaiFENesin-dextromethorphan (ROBITUSSIN DM) 100-10 MG/5ML syrup Take 10 mLs by mouth every 4 (four) hours as needed for cough.   PRN  . hydrochlorothiazide (HYDRODIURIL) 25 MG tablet Take 25 mg by mouth daily.   11/13/2014 at am  . ipratropium-albuterol (DUONEB) 0.5-2.5 (3) MG/3ML SOLN Take 3 mLs by nebulization every 6 (six) hours as needed (for shortness of breath).    PRN  . Magnesium Oxide 250 MG TABS Take 250 mg by mouth daily.   11/13/2014 at am  . olmesartan (BENICAR) 20 MG tablet Take 20 mg by mouth daily.   11/13/2014 at am  . pantoprazole (PROTONIX) 40 MG tablet Take 1 tablet (40 mg total) by mouth 2 (two) times daily. 60 tablet 0 11/13/2014 at am  . predniSONE (STERAPRED UNI-PAK 21 TAB) 10 MG (21) TBPK tablet Take 1 tablet (10 mg total) by mouth daily. Take 6 tabs first day, 5 tab on day 2, then  4 on day 3rd, 3 tabs on day 4th , 2 tab on day 5th, and 1 tab on 6th day. 21 tablet 0 11/12/2014 at pm  . pregabalin (LYRICA) 150 MG capsule Take 150 mg by mouth 2 (two) times daily.   11/13/2014 at am  . propranolol ER (INDERAL LA) 80 MG 24 hr capsule Take 1 capsule (80 mg total) by mouth daily. 90 capsule 0 11/13/2014 at 0800  . rOPINIRole (REQUIP) 0.25 MG tablet Take 3 tablets (0.75 mg total) by mouth at bedtime. 90 tablet 2 11/12/2014 at pm  . tiotropium (SPIRIVA) 18 MCG inhalation capsule Place 18 mcg into inhaler and inhale daily.   11/13/2014 at am  . moxifloxacin (AVELOX) 400 MG tablet Take 1 tablet (400 mg total) by mouth daily at 8 pm. (Patient not taking: Reported on 11/13/2014) 5 tablet 0 Not Taking at Unknown time    Current medications: Current Facility-Administered Medications  Medication Dose Route Frequency Provider Last Rate Last Dose  . 0.9 %  sodium  chloride infusion   Intravenous Continuous Katharina Caper, MD      . acetaminophen (TYLENOL) tablet 650 mg  650 mg Oral Q6H PRN Katharina Caper, MD       Or  . acetaminophen (TYLENOL) suppository 650 mg  650 mg Rectal Q6H PRN Katharina Caper, MD      . acetaminophen (TYLENOL) tablet 1,000 mg  1,000 mg Oral Q6H PRN Katharina Caper, MD      . albuterol (PROVENTIL) (2.5 MG/3ML) 0.083% nebulizer solution 2.5 mg  2.5 mg Nebulization Q6H PRN Katharina Caper, MD      . Chlorpheniramine-APAP 2-325 MG TABS   Oral Daily PRN Katharina Caper, MD      . docusate sodium (COLACE) capsule 100 mg  100 mg Oral BID Katharina Caper, MD   100 mg at 11/13/14 1343  . Fluticasone Furoate-Vilanterol 100-25 MCG/INH AEPB 1 puff  1 puff Inhalation BID Katharina Caper, MD   1 puff at 11/13/14 1315  . guaiFENesin-dextromethorphan (ROBITUSSIN DM) 100-10 MG/5ML syrup 10 mL  10 mL Oral Q4H PRN Katharina Caper, MD      . HYDROcodone-acetaminophen (NORCO/VICODIN) 5-325 MG per tablet 1-2 tablet  1-2 tablet Oral Q4H PRN Katharina Caper, MD      . ipratropium-albuterol (DUONEB) 0.5-2.5 (3) MG/3ML nebulizer solution 3 mL  3 mL Nebulization Q6H PRN Katharina Caper, MD      . magnesium oxide (MAG-OX) tablet 400 mg  400 mg Oral Daily Katharina Caper, MD   400 mg at 11/13/14 1343  . pantoprazole (PROTONIX) EC tablet 40 mg  40 mg Oral BID Katharina Caper, MD   40 mg at 11/13/14 1343  . pregabalin (LYRICA) capsule 150 mg  150 mg Oral BID Katharina Caper, MD   150 mg at 11/13/14 1343  . rOPINIRole (REQUIP) tablet 0.75 mg  0.75 mg Oral QHS Katharina Caper, MD      . sodium chloride 0.9 % injection 3 mL  3 mL Intravenous Q12H Katharina Caper, MD   3 mL at 11/13/14 1344  . tiotropium (SPIRIVA) inhalation capsule 18 mcg  18 mcg Inhalation Daily Katharina Caper, MD   18 mcg at 11/13/14 1343      Allergies: No Known Allergies    Past Medical History: Past Medical History  Diagnosis Date  . OA (osteoarthritis)   . Fibromyalgia   . Anemia   . Sleep apnea   . Fatigue    . Edema   . Esophageal reflux   .  Nocturia   . Hypertension   . COPD (chronic obstructive pulmonary disease)      Past Surgical History: Past Surgical History  Procedure Laterality Date  . Cholecystectomy    . Total knee arthroplasty      right  . Vesicovaginal fistula closure w/ tah    . Abdominal hysterectomy       Family History: Family History  Problem Relation Age of Onset  . Hypertension Mother   . Diabetes    . Breast cancer Daughter      Social History: Social History   Social History  . Marital Status: Single    Spouse Name: N/A  . Number of Children: N/A  . Years of Education: N/A   Occupational History  . Not on file.   Social History Main Topics  . Smoking status: Former Smoker    Quit date: 11/04/1983  . Smokeless tobacco: Not on file  . Alcohol Use: No  . Drug Use: No  . Sexual Activity: Not on file   Other Topics Concern  . Not on file   Social History Narrative     Review of Systems: Review of Systems  Constitutional: Positive for malaise/fatigue. Negative for fever, chills, weight loss and diaphoresis.  HENT: Negative.  Negative for congestion, ear discharge, ear pain, hearing loss, nosebleeds, sore throat and tinnitus.   Eyes: Negative for blurred vision, double vision, photophobia, pain, discharge and redness.  Respiratory: Positive for cough, sputum production, shortness of breath and wheezing. Negative for hemoptysis and stridor.   Cardiovascular: Positive for palpitations, orthopnea and PND. Negative for chest pain, claudication and leg swelling.  Gastrointestinal: Negative.  Negative for heartburn, nausea, vomiting, abdominal pain, diarrhea, constipation, blood in stool and melena.  Genitourinary: Negative.  Negative for dysuria, urgency, frequency, hematuria and flank pain.  Musculoskeletal: Negative for myalgias, back pain, joint pain, falls and neck pain.  Skin: Negative for itching and rash.  Neurological: Positive for  weakness. Negative for dizziness, tingling, tremors, sensory change, speech change, focal weakness, seizures, loss of consciousness and headaches.  Endo/Heme/Allergies: Negative for environmental allergies and polydipsia. Does not bruise/bleed easily.  Psychiatric/Behavioral: Positive for memory loss. Negative for depression, suicidal ideas, hallucinations and substance abuse. The patient is not nervous/anxious and does not have insomnia.     Vital Signs: Blood pressure 97/53, pulse 72, temperature 97.6 F (36.4 C), temperature source Oral, resp. rate 16, height 5\' 4"  (1.626 m), weight 162.388 kg (358 lb), SpO2 95 %.  Weight trends: Filed Weights   11/13/14 0921  Weight: 162.388 kg (358 lb)    Physical Exam: General: NAD, morbidly obese laying in bed.   Head: Normocephalic, atraumatic. Moist oral mucosal membranes  Eyes: Anicteric, PERRL  Neck: Supple, trachea midline  Lungs:  Scattered wheezes  Heart: Regular rate and rhythm  Abdomen:  Soft, nontender, obese  Extremities: no peripheral edema.  Neurologic: Nonfocal, moving all four extremities  Skin: No lesions        Lab results: Basic Metabolic Panel:  Recent Labs Lab 11/07/14 0423 11/08/14 0557 11/13/14 0930  NA 141 141 139  K 4.1 4.1 5.0  CL 99* 98* 100*  CO2 35* 36* 29  GLUCOSE 118* 109* 116*  BUN 27* 34* 95*  CREATININE 1.19* 1.13* 4.88*  CALCIUM 9.2 9.1 8.9    Liver Function Tests: No results for input(s): AST, ALT, ALKPHOS, BILITOT, PROT, ALBUMIN in the last 168 hours.  Recent Labs Lab 11/13/14 0930  LIPASE 19*   No results for  input(s): AMMONIA in the last 168 hours.  CBC:  Recent Labs Lab 11/13/14 0930  WBC 12.1*  HGB 11.4*  HCT 35.8  MCV 93.8  PLT 200    Cardiac Enzymes:  Recent Labs Lab 11/13/14 0930  TROPONINI <0.03    BNP: Invalid input(s): POCBNP  CBG: No results for input(s): GLUCAP in the last 168 hours.  Microbiology: Results for orders placed or performed during  the hospital encounter of 08/23/14  Urine culture     Status: None   Collection Time: 08/23/14  2:46 PM  Result Value Ref Range Status   Specimen Description URINE, RANDOM  Final   Special Requests NONE  Final   Culture >=100,000 COLONIES/mL ENTEROBACTER AEROGENES  Final   Report Status 08/26/2014 FINAL  Final   Organism ID, Bacteria ENTEROBACTER AEROGENES  Final      Susceptibility   Enterobacter aerogenes - MIC*    CEFTAZIDIME <=1 SENSITIVE Sensitive     CEFAZOLIN >=64 RESISTANT Resistant     CEFTRIAXONE <=1 SENSITIVE Sensitive     CIPROFLOXACIN <=0.25 SENSITIVE Sensitive     GENTAMICIN <=1 SENSITIVE Sensitive     IMIPENEM 1 SENSITIVE Sensitive     TRIMETH/SULFA <=20 SENSITIVE Sensitive     CEFOXITIN >=64 RESISTANT Resistant     NITROFURANTOIN Value in next row Resistant      RESISTANT128    * >=100,000 COLONIES/mL ENTEROBACTER AEROGENES    Coagulation Studies: No results for input(s): LABPROT, INR in the last 72 hours.  Urinalysis:  Recent Labs  11/13/14 0930  COLORURINE YELLOW*  LABSPEC 1.011  PHURINE 5.0  GLUCOSEU NEGATIVE  HGBUR 2+*  BILIRUBINUR NEGATIVE  KETONESUR NEGATIVE  PROTEINUR NEGATIVE  NITRITE NEGATIVE  LEUKOCYTESUR TRACE*      Imaging: Ct Abdomen Pelvis Wo Contrast  11/13/2014   CLINICAL DATA:  Abdominal pain and urinary incontinence  EXAM: CT ABDOMEN AND PELVIS WITHOUT CONTRAST  TECHNIQUE: Multidetector CT imaging of the abdomen and pelvis was performed following the standard protocol without IV contrast. Oral contrast was administered.  COMPARISON:  April 27, 2014  FINDINGS: There is patchy bibasilar atelectasis, slightly more on the left than on the right.  No focal liver lesions are identified on this noncontrast enhanced study. Gallbladder is absent. There is no appreciable biliary duct dilatation.  Spleen, pancreas, and adrenals appear normal.  There is a cyst arising from the left kidney measuring 9.0 x 8.6 x 5.9 cm. There is mild scarring in  the right kidney. There is no hydronephrosis on either side. There is no renal or ureteral calculus on either side. There are multiple small phleboliths which are near but separate from the ureters, particularly on the left side.  In the pelvis, the urinary bladder is midline with normal wall thickness. Uterus is absent. There is no pelvic mass or pelvic fluid collection. Appendix appears within normal limits.  There is no bowel obstruction. No free air or portal venous air. There is no appreciable ascites, adenopathy, or abscess in the abdomen or pelvis. There is atherosclerotic change in the aorta but no aneurysm. There is degenerative change in the lumbar spine. There are no blastic or lytic bone lesions.  IMPRESSION: No bowel obstruction. No bowel wall or mesenteric thickening. No abscess. Appendix appears normal.  Gallbladder and uterus absent. No renal or ureteral calculus. No hydronephrosis. Persistent large cyst arising from left kidney.   Electronically Signed   By: Bretta Bang III M.D.   On: 11/13/2014 11:13  Assessment & Plan: Ms. SHARRIE BRILLANTES is a 64 y.o.  female with COPD, hypertension, obstructive sleep apnea not on CPAP, fibromyalgia, anemia, GERD, hypertension, morbid obesity who was admitted to [facility] on 11/13/2014 for acute kidney injury. Creatinine of 4.88 from baseline of 1.  1. Acute Renal Failure: with hypotension. Labs are consistent with prerenal azotemia. Patient was discharged with olmesartan, hydrochlorothiazide, furosemide, and etodolac.  - Overdiuresis seems to be the working diagnosis. Started on IV NS. - Check renal ultrasound - Agree with holding diuretics (furosemide and hydrochlorothiazide), olmesartan and etodolac.  - Renally dose all medications - Discussed dialysis with patient.  - Repeat renal function panel.   2. Hypotension: holding blood pressure agents. Will continue to monitor blood pressure.   3. Diastolic congestive heart failure: chronic.  Not in acute exacerbation. Continue to monitor volume status, especially with NS currently being administered.    LOS: 0 Ramani Riva 9/9/20163:53 PM

## 2014-11-14 ENCOUNTER — Inpatient Hospital Stay: Payer: Medicare PPO

## 2014-11-14 LAB — BASIC METABOLIC PANEL
Anion gap: 6 (ref 5–15)
BUN: 75 mg/dL — ABNORMAL HIGH (ref 6–20)
CALCIUM: 8.6 mg/dL — AB (ref 8.9–10.3)
CHLORIDE: 108 mmol/L (ref 101–111)
CO2: 28 mmol/L (ref 22–32)
CREATININE: 2.25 mg/dL — AB (ref 0.44–1.00)
GFR calc non Af Amer: 22 mL/min — ABNORMAL LOW (ref >60)
GFR, EST AFRICAN AMERICAN: 26 mL/min — AB (ref >60)
GLUCOSE: 103 mg/dL — AB (ref 65–99)
Potassium: 5.4 mmol/L — ABNORMAL HIGH (ref 3.5–5.1)
Sodium: 142 mmol/L (ref 135–145)

## 2014-11-14 LAB — CBC
HEMATOCRIT: 32.4 % — AB (ref 35.0–47.0)
HEMOGLOBIN: 10.4 g/dL — AB (ref 12.0–16.0)
MCH: 30.2 pg (ref 26.0–34.0)
MCHC: 32.2 g/dL (ref 32.0–36.0)
MCV: 93.6 fL (ref 80.0–100.0)
Platelets: 187 10*3/uL (ref 150–440)
RBC: 3.46 MIL/uL — ABNORMAL LOW (ref 3.80–5.20)
RDW: 14.7 % — ABNORMAL HIGH (ref 11.5–14.5)
WBC: 9.6 10*3/uL (ref 3.6–11.0)

## 2014-11-14 MED ORDER — PREDNISONE 20 MG PO TABS
20.0000 mg | ORAL_TABLET | Freq: Every day | ORAL | Status: AC
Start: 2014-11-18 — End: 2014-11-18
  Administered 2014-11-18: 20 mg via ORAL
  Filled 2014-11-14: qty 1

## 2014-11-14 MED ORDER — PREDNISONE 10 MG PO TABS
10.0000 mg | ORAL_TABLET | Freq: Every day | ORAL | Status: AC
  Administered 2014-11-19: 10 mg via ORAL
  Filled 2014-11-14: qty 1

## 2014-11-14 MED ORDER — PREDNISONE 20 MG PO TABS
40.0000 mg | ORAL_TABLET | Freq: Every day | ORAL | Status: AC
  Administered 2014-11-16: 40 mg via ORAL
  Filled 2014-11-14: qty 2

## 2014-11-14 MED ORDER — LEVOFLOXACIN 500 MG PO TABS
750.0000 mg | ORAL_TABLET | ORAL | Status: DC
  Administered 2014-11-14: 750 mg via ORAL
  Filled 2014-11-14: qty 2

## 2014-11-14 MED ORDER — PREDNISONE 50 MG PO TABS
50.0000 mg | ORAL_TABLET | Freq: Every day | ORAL | Status: AC
  Administered 2014-11-15: 50 mg via ORAL
  Filled 2014-11-14: qty 1

## 2014-11-14 MED ORDER — OXYBUTYNIN CHLORIDE 5 MG PO TABS
5.0000 mg | ORAL_TABLET | Freq: Every day | ORAL | Status: DC
  Administered 2014-11-14 – 2014-11-19 (×6): 5 mg via ORAL
  Filled 2014-11-14 (×6): qty 1

## 2014-11-14 MED ORDER — ALBUTEROL SULFATE (2.5 MG/3ML) 0.083% IN NEBU
2.5000 mg | INHALATION_SOLUTION | RESPIRATORY_TRACT | Status: DC
  Administered 2014-11-14 – 2014-11-15 (×8): 2.5 mg via RESPIRATORY_TRACT
  Filled 2014-11-14 (×9): qty 3

## 2014-11-14 MED ORDER — PREDNISONE 20 MG PO TABS
30.0000 mg | ORAL_TABLET | Freq: Every day | ORAL | Status: AC
  Administered 2014-11-17: 30 mg via ORAL
  Filled 2014-11-14: qty 1

## 2014-11-14 NOTE — Progress Notes (Signed)
[facility]  ROUNDING NOTE   Subjective:   Laying in bed flat. No shortness of breath or orthopnea.  Complains of left calf pain.   Objective:  Vital signs in last 24 hours:  Temp:  [97.6 F (36.4 C)-99.5 F (37.5 C)] 98.8 F (37.1 C) (09/10 0800) Pulse Rate:  [68-80] 74 (09/10 0800) Resp:  [16-24] 20 (09/09 2333) BP: (78-105)/(39-81) 90/60 mmHg (09/10 0810) SpO2:  [89 %-100 %] 91 % (09/10 0800) Weight:  [160.392 kg (353 lb 9.6 oz)-162.388 kg (358 lb)] 160.392 kg (353 lb 9.6 oz) (09/10 0401)  Weight change:  Filed Weights   11/13/14 0921 11/14/14 0401  Weight: 162.388 kg (358 lb) 160.392 kg (353 lb 9.6 oz)    Intake/Output: I/O last 3 completed shifts: In: 1071.3 [P.O.:120; I.V.:951.3] Out: -    Intake/Output this shift:     Physical Exam: General: NAD, morbidly obese,   Head: Normocephalic, atraumatic. Moist oral mucosal membranes  Eyes: Anicteric, PERRL  Neck: Supple, trachea midline  Lungs:  Clear to auscultation, breathing [place]  Heart: Regular rate and rhythm  Abdomen:  Soft, nontender, obese  Extremities: no peripheral edema. Tenderness over left calf  Neurologic: Nonfocal, moving all four extremities  Skin: No lesions       Basic Metabolic Panel:  Recent Labs Lab 11/08/14 0557 11/13/14 0930 11/13/14 1629 11/14/14 0528  NA 141 139 139 142  K 4.1 5.0 5.6* 5.4*  CL 98* 100* 104 108  CO2 36* 29 26 28   GLUCOSE 109* 116* 110* 103*  BUN 34* 95* 85* 75*  CREATININE 1.13* 4.88* 3.43* 2.25*  CALCIUM 9.1 8.9 8.8* 8.6*    Liver Function Tests: No results for input(s): AST, ALT, ALKPHOS, BILITOT, PROT, ALBUMIN in the last 168 hours.  Recent Labs Lab 11/13/14 0930  LIPASE 19*   No results for input(s): AMMONIA in the last 168 hours.  CBC:  Recent Labs Lab 11/13/14 0930 11/14/14 0528  WBC 12.1* 9.6  HGB 11.4* 10.4*  HCT 35.8 32.4*  MCV 93.8 93.6  PLT 200 187    Cardiac Enzymes:  Recent Labs Lab 11/13/14 0930  TROPONINI  <0.03    BNP: Invalid input(s): POCBNP  CBG: No results for input(s): GLUCAP in the last 168 hours.  Microbiology: Results for orders placed or performed during the hospital encounter of 08/23/14  Urine culture     Status: None   Collection Time: 08/23/14  2:46 PM  Result Value Ref Range Status   Specimen Description URINE, RANDOM  Final   Special Requests NONE  Final   Culture >=100,000 COLONIES/mL ENTEROBACTER AEROGENES  Final   Report Status 08/26/2014 FINAL  Final   Organism ID, Bacteria ENTEROBACTER AEROGENES  Final      Susceptibility   Enterobacter aerogenes - MIC*    CEFTAZIDIME <=1 SENSITIVE Sensitive     CEFAZOLIN >=64 RESISTANT Resistant     CEFTRIAXONE <=1 SENSITIVE Sensitive     CIPROFLOXACIN <=0.25 SENSITIVE Sensitive     GENTAMICIN <=1 SENSITIVE Sensitive     IMIPENEM 1 SENSITIVE Sensitive     TRIMETH/SULFA <=20 SENSITIVE Sensitive     CEFOXITIN >=64 RESISTANT Resistant     NITROFURANTOIN Value in next row Resistant      RESISTANT128    * >=100,000 COLONIES/mL ENTEROBACTER AEROGENES    Coagulation Studies: No results for input(s): LABPROT, INR in the last 72 hours.  Urinalysis:  Recent Labs  11/13/14 0930  COLORURINE YELLOW*  LABSPEC 1.011  PHURINE 5.0  GLUCOSEU  NEGATIVE  HGBUR 2+*  BILIRUBINUR NEGATIVE  KETONESUR NEGATIVE  PROTEINUR NEGATIVE  NITRITE NEGATIVE  LEUKOCYTESUR TRACE*      Imaging: Ct Abdomen Pelvis Wo Contrast  11/13/2014   CLINICAL DATA:  Abdominal pain and urinary incontinence  EXAM: CT ABDOMEN AND PELVIS WITHOUT CONTRAST  TECHNIQUE: Multidetector CT imaging of the abdomen and pelvis was performed following the standard protocol without IV contrast. Oral contrast was administered.  COMPARISON:  April 27, 2014  FINDINGS: There is patchy bibasilar atelectasis, slightly more on the left than on the right.  No focal liver lesions are identified on this noncontrast enhanced study. Gallbladder is absent. There is no appreciable  biliary duct dilatation.  Spleen, pancreas, and adrenals appear normal.  There is a cyst arising from the left kidney measuring 9.0 x 8.6 x 5.9 cm. There is mild scarring in the right kidney. There is no hydronephrosis on either side. There is no renal or ureteral calculus on either side. There are multiple small phleboliths which are near but separate from the ureters, particularly on the left side.  In the pelvis, the urinary bladder is midline with normal wall thickness. Uterus is absent. There is no pelvic mass or pelvic fluid collection. Appendix appears within normal limits.  There is no bowel obstruction. No free air or portal venous air. There is no appreciable ascites, adenopathy, or abscess in the abdomen or pelvis. There is atherosclerotic change in the aorta but no aneurysm. There is degenerative change in the lumbar spine. There are no blastic or lytic bone lesions.  IMPRESSION: No bowel obstruction. No bowel wall or mesenteric thickening. No abscess. Appendix appears normal.  Gallbladder and uterus absent. No renal or ureteral calculus. No hydronephrosis. Persistent large cyst arising from left kidney.   Electronically Signed   By: Lowella Grip III M.D.   On: 11/13/2014 11:13     Medications:   . sodium chloride 75 mL/hr at 11/13/14 1556   . docusate sodium  100 mg Oral BID  . Fluticasone Furoate-Vilanterol  1 puff Inhalation BID  . magnesium oxide  400 mg Oral Daily  . pantoprazole  40 mg Oral BID  . pregabalin  150 mg Oral BID  . rOPINIRole  0.75 mg Oral QHS  . sodium chloride  3 mL Intravenous Q12H  . tiotropium  18 mcg Inhalation Daily   acetaminophen **OR** acetaminophen, acetaminophen, albuterol, Chlorpheniramine-APAP, guaiFENesin-dextromethorphan, HYDROcodone-acetaminophen, ipratropium-albuterol  Assessment/ Plan:  Ms. Loretta Wade is a 64 y.o. female with COPD, hypertension, obstructive sleep apnea not on CPAP, fibromyalgia, anemia, GERD, hypertension, morbid  obesity who was admitted to [facility] on 11/13/2014 for acute kidney injury. Creatinine of 4.88 from baseline of 1.  1. Acute Renal Failure: with hypotension. Labs are consistent with prerenal azotemia. Patient was discharged with olmesartan, hydrochlorothiazide, furosemide, and etodolac.  - Overdiuresis seems to be the working diagnosis. Started on IV NS. Creatinine improving.  - Pending renal ultrasound - Agree with holding diuretics (furosemide and hydrochlorothiazide), olmesartan and etodolac.  - Renally dose all medications  2. Hypotension: holding blood pressure agents. Will continue to monitor blood pressure.   3. Diastolic congestive heart failure: chronic. Not in acute exacerbation. Continue to monitor volume status, especially with NS currently being administered  4. Left calf pain: no edema. Check dopplers.    LOS: [place], Dimitrious Micciche 9/10/20168:55 AM

## 2014-11-14 NOTE — Care Management Note (Signed)
Case Management Note  Patient Details  Name: [name] MRN: [id number] Date of Birth: [birth year]  Subjective/Objective:       Ms Tschetter is currently an active client of [facility] and is receiving home health RN services.              Action/Plan:   Expected Discharge Date:  11/17/14               Expected Discharge Plan:     In-House Referral:     Discharge planning Services     Post Acute Care Choice:    Choice offered to:     DME Arranged:    DME Agency:     HH Arranged:    HH Agency:     Status of Service:     Medicare Important Message Given:    Date Medicare IM Given:    Medicare IM give by:    Date Additional Medicare IM Given:    Additional Medicare Important Message give by:     If discussed at [facility] of Stay Meetings, dates discussed:    Additional Comments:  Alleya Demeter A, RN 11/14/2014, 4:41 PM

## 2014-11-14 NOTE — Progress Notes (Signed)
Initial Nutrition Assessment  DOCUMENTATION CODES:      INTERVENTION:   Meals and Snacks: Cater to patient preferences Education: Reviewed importance of low sodium nutrition therapy and current diet order, which pt reports being familiar with and following at home PTA.   NUTRITION DIAGNOSIS:   No nutrition diagnosis at this time  GOAL:   Patient will meet greater than or equal to 90% of their needs  MONITOR:    (Energy Intake, Anthropometrics, Digestive system, Electrolyte and renal Profile)  REASON FOR ASSESSMENT:   Diagnosis    ASSESSMENT:   Pt admitted with acute renal failure secondary to prerenal azotemia. Pt with h/o CHF, recent admission 8/31-/94 with COPD and CHF exacerbation.  Past Medical History  Diagnosis Date  . OA (osteoarthritis)   . Fibromyalgia   . Anemia   . Sleep apnea   . Fatigue   . Edema   . Esophageal reflux   . Nocturia   . Hypertension   . COPD (chronic obstructive pulmonary disease)    Diet Order:  Diet Heart Room service appropriate?: Yes; Fluid consistency:: Thin    Current Nutrition: Pt reports eating all of eggs and bagel this am but not her oatmeal.   Food/Nutrition-Related History: Pt reports good appetite PTA.    Medications: NS at 27mL/hr, prednisone, colace, mag-ox, protonix  Electrolyte/Renal Profile and Glucose Profile:   Recent Labs Lab 11/13/14 0930 11/13/14 1629 11/14/14 0528  NA 139 139 142  K 5.0 5.6* 5.4*  CL 100* 104 108  CO2 29 26 28   BUN 95* 85* 75*  CREATININE 4.88* 3.43* 2.25*  CALCIUM 8.9 8.8* 8.6*  GLUCOSE 116* 110* 103*   Protein Profile: No results for input(s): ALBUMIN in the last 168 hours.  Gastrointestinal Profile: Last BM:  11/13/2014   Nutrition-Focused Physical Exam Findings: Nutrition-Focused physical exam completed. Findings are WDL for fat depletion, muscle depletion, and 2+ edema per NSg    Weight Change: Pt weight relatively stable per CHL.   Skin:  Reviewed, no  issues  Height:   Ht Readings from Last 1 Encounters:  11/13/14 5\' 4"  (1.626 m)    Weight:   Wt Readings from Last 1 Encounters:  11/14/14 353 lb 9.6 oz (160.392 kg)    Wt Readings from Last 10 Encounters:  11/14/14 353 lb 9.6 oz (160.392 kg)  11/12/14 355 lb (161.027 kg)  11/08/14 350 lb 6.4 oz (158.94 kg)  10/14/14 358 lb (162.388 kg)  09/16/14 359 lb (162.841 kg)  09/01/14 400 lb (181.439 kg)  08/23/14 400 lb (181.439 kg)  07/08/14 360 lb (163.295 kg)  07/06/14 364 lb (165.109 kg)     Ideal Body Weight:   54.5kg  BMI:  Body mass index is 60.67 kg/(m^2).  Estimated Nutritional Needs:   Kcal:  BEE: 1090kcals, TEE: (IF 1.1-1.3)(AF 1.3) 1559-1847kcals, using IBw of 54.5kg  Protein:  44-55g protein (0.8-1.0g/kg) using IBW of 54.5kg  Fluid:  1363-1634mL of fluid (25-4mL/kg) using IBw of 54.5kg   MODERATE Care Level  Dwyane Luo, RD, LDN Pager [phone]

## 2014-11-14 NOTE — Progress Notes (Signed)
[facility] [place] [facility]   PATIENT NAME: Loretta Wade    MR#:  [id number]  DATE OF BIRTH:  [birth year]  SUBJECTIVE:  CHIEF COMPLAINT:   Chief Complaint  Patient presents with  . Weakness  . Urinary Incontinence  . Abdominal Pain   - Obese patient with recent COPD exacerbation, discharged from the hospital, comes back with acute renal failure and hypotension. -With gentle hydration, renal function has improved. Blood pressure still remains on the lower side -Continues to have some wheezing. -Also complains of left leg pain  REVIEW OF SYSTEMS:  Review of Systems  Constitutional: Negative for fever and chills.  Respiratory: Positive for shortness of breath and wheezing. Negative for cough.   Cardiovascular: Negative for chest pain and palpitations.  Gastrointestinal: Positive for abdominal pain. Negative for nausea, vomiting, diarrhea and constipation.  Genitourinary: Negative for dysuria, urgency and frequency.  Musculoskeletal: Positive for myalgias.       Left leg pain in the calf region noted.  Neurological: Positive for weakness. Negative for dizziness, seizures and headaches.    DRUG ALLERGIES:  No Known Allergies  VITALS:  Blood pressure 90/60, pulse 74, temperature 98.8 F (37.1 C), temperature source Oral, resp. rate 20, height 5\' 4"  (1.626 m), weight 160.392 kg (353 lb 9.6 oz), SpO2 91 %.  PHYSICAL EXAMINATION:  Physical Exam  GENERAL:  64 y.o.-year-old obese patient lying in the bed with no acute distress.  EYES: Pupils equal, round, reactive to light and accommodation. No scleral icterus. Extraocular muscles intact.  HEENT: Head atraumatic, normocephalic. Oropharynx and nasopharynx clear.  NECK:  Supple, no jugular venous distention. No thyroid enlargement, no tenderness.  LUNGS:  No use of accessory muscles of respiration. Scattered expiratory wheezes heard posteriorly, more prominent at the bases. No crackles noted.  Moving air bilaterally. CARDIOVASCULAR: S1, S2 normal. No murmurs, rubs, or gallops.  ABDOMEN: Soft, nontender, nondistended. Bowel sounds present. No organomegaly or mass.  EXTREMITIES: No pedal edema, cyanosis, or clubbing. Left calf and shin areas are tender to touch. No significant swelling noted. Nonerythematous. NEUROLOGIC: Cranial nerves II through XII are intact. Muscle strength 5/5 in all extremities. Sensation intact. Gait not checked.  PSYCHIATRIC: The patient is alert and oriented x 3.  SKIN: No obvious rash, lesion, or ulcer.   LABORATORY PANEL:   CBC  Recent Labs Lab 11/14/14 0528  WBC 9.6  HGB 10.4*  HCT 32.4*  PLT 187   ------------------------------------------------------------------------------------------------------------------  Chemistries   Recent Labs Lab 11/14/14 0528  NA 142  K 5.4*  CL 108  CO2 28  GLUCOSE 103*  BUN 75*  CREATININE 2.25*  CALCIUM 8.6*   ------------------------------------------------------------------------------------------------------------------  Cardiac Enzymes  Recent Labs Lab 11/13/14 0930  TROPONINI <0.03   ------------------------------------------------------------------------------------------------------------------  RADIOLOGY:  Ct Abdomen Pelvis Wo Contrast  11/13/2014   CLINICAL DATA:  Abdominal pain and urinary incontinence  EXAM: CT ABDOMEN AND PELVIS WITHOUT CONTRAST  TECHNIQUE: Multidetector CT imaging of the abdomen and pelvis was performed following the standard protocol without IV contrast. Oral contrast was administered.  COMPARISON:  April 27, 2014  FINDINGS: There is patchy bibasilar atelectasis, slightly more on the left than on the right.  No focal liver lesions are identified on this noncontrast enhanced study. Gallbladder is absent. There is no appreciable biliary duct dilatation.  Spleen, pancreas, and adrenals appear normal.  There is a cyst arising from the left kidney measuring 9.0 x 8.6 x  5.9 cm. There is mild scarring in the right  kidney. There is no hydronephrosis on either side. There is no renal or ureteral calculus on either side. There are multiple small phleboliths which are near but separate from the ureters, particularly on the left side.  In the pelvis, the urinary bladder is midline with normal wall thickness. Uterus is absent. There is no pelvic mass or pelvic fluid collection. Appendix appears within normal limits.  There is no bowel obstruction. No free air or portal venous air. There is no appreciable ascites, adenopathy, or abscess in the abdomen or pelvis. There is atherosclerotic change in the aorta but no aneurysm. There is degenerative change in the lumbar spine. There are no blastic or lytic bone lesions.  IMPRESSION: No bowel obstruction. No bowel wall or mesenteric thickening. No abscess. Appendix appears normal.  Gallbladder and uterus absent. No renal or ureteral calculus. No hydronephrosis. Persistent large cyst arising from left kidney.   Electronically Signed   By: Bretta Bang III M.D.   On: 11/13/2014 11:13    EKG:   Orders placed or performed during the hospital encounter of 11/13/14  . ED EKG  . ED EKG  . EKG 12-Lead  . EKG 12-Lead  . ED EKG  . ED EKG    ASSESSMENT AND PLAN:   64 year old obese female with history of COPD on 2 L home oxygen, congestive heart failure, diastolic dysfunction, fibromyalgia and GERD presents to the hospital secondary to weakness and was noted to be in acute renal failure.  #1 acute renal failure-prerenal and also ATN. -Was discharged on Diuretics, and noted to be hypotensive and dehydrated. -Nephrotoxins on hold, diuretics held. Gentle hydration. -Improving creatinine, down from 4.8 to 2.2 this morning. -Appreciate nephrology consult -Renal ultrasound today. - Monitor urine output  #2 COPD exacerbation with recent bronchitis-did not finish her Levaquin or prednisone taper -Wheezing noted on exam  today. -Remains on 2 L oxygen. Continue prednisone taper and also Levaquin restarted.  #3 hypertension-likely hypovolemic. -Gentle hydration, hold Lasix and hydrochlorothiazide -Hold other blood pressure medications as well.  #4 left leg pain-Doppler to rule out any lower extremity clot  #5 chronic diastolic CHF-currently volume depleted to getting gentle hydration. -Monitor for any fluid overload. Diurietics on hold  #6 DVT prophylaxis-Ted's and SCDs. -Subcutaneous hematoma on the abdomen causing pain, so will hold off on heparin shots   All the records are reviewed and case discussed with Care Management/Social Workerr. Management plans discussed with the patient, family and they are in agreement.  CODE STATUS: Full code  TOTAL TIME TAKING CARE OF THIS PATIENT: 36 minutes.   POSSIBLE D/C IN 2-3 DAYS, DEPENDING ON CLINICAL CONDITION.   Intisar Claudio M.D on 11/14/2014 at 11:09 AM  Between 7am to 6pm - Pager - [phone]  After 6pm go to [email or web address] - password [email or web address] [facility]  [facility]  Office  [phone]  CC: Primary care physician; Brayton El, MD

## 2014-11-15 ENCOUNTER — Inpatient Hospital Stay: Payer: Medicare PPO

## 2014-11-15 LAB — BASIC METABOLIC PANEL
ANION GAP: 5 (ref 5–15)
BUN: 46 mg/dL — ABNORMAL HIGH (ref 6–20)
CALCIUM: 9.1 mg/dL (ref 8.9–10.3)
CHLORIDE: 108 mmol/L (ref 101–111)
CO2: 29 mmol/L (ref 22–32)
CREATININE: 1.33 mg/dL — AB (ref 0.44–1.00)
GFR calc non Af Amer: 42 mL/min — ABNORMAL LOW (ref >60)
GFR, EST AFRICAN AMERICAN: 49 mL/min — AB (ref >60)
Glucose, Bld: 106 mg/dL — ABNORMAL HIGH (ref 65–99)
Potassium: 6 mmol/L — ABNORMAL HIGH (ref 3.5–5.1)
SODIUM: 142 mmol/L (ref 135–145)

## 2014-11-15 MED ORDER — SODIUM CHLORIDE 0.9 % IV SOLN
1.0000 g | Freq: Once | INTRAVENOUS | Status: AC
  Administered 2014-11-15: 1 g via INTRAVENOUS
  Filled 2014-11-15: qty 10

## 2014-11-15 MED ORDER — INSULIN ASPART 100 UNIT/ML IV SOLN
10.0000 [IU] | Freq: Once | INTRAVENOUS | Status: AC
  Administered 2014-11-15: 10 [IU] via INTRAVENOUS
  Filled 2014-11-15: qty 0.1

## 2014-11-15 MED ORDER — ALBUTEROL SULFATE (2.5 MG/3ML) 0.083% IN NEBU
2.5000 mg | INHALATION_SOLUTION | RESPIRATORY_TRACT | Status: DC | PRN
  Administered 2014-11-18 (×2): 2.5 mg via RESPIRATORY_TRACT
  Filled 2014-11-15 (×2): qty 3

## 2014-11-15 MED ORDER — DEXTROSE 50 % IV SOLN
25.0000 mL | Freq: Once | INTRAVENOUS | Status: AC
  Administered 2014-11-15: 25 mL via INTRAVENOUS
  Filled 2014-11-15: qty 50

## 2014-11-15 MED ORDER — MOMETASONE FURO-FORMOTEROL FUM 100-5 MCG/ACT IN AERO: 2.0000 | INHALATION_SPRAY | Freq: Two times a day (BID) | RESPIRATORY_TRACT | Status: DC

## 2014-11-15 MED ORDER — LACTULOSE 10 GM/15ML PO SOLN
20.0000 g | Freq: Once | ORAL | Status: AC
  Administered 2014-11-15: 20 g via ORAL
  Filled 2014-11-15: qty 30

## 2014-11-15 MED ORDER — SODIUM POLYSTYRENE SULFONATE 15 GM/60ML PO SUSP
30.0000 g | Freq: Once | ORAL | Status: AC
  Administered 2014-11-15: 30 g via ORAL
  Filled 2014-11-15: qty 120

## 2014-11-15 MED ORDER — MOMETASONE FURO-FORMOTEROL FUM 100-5 MCG/ACT IN AERO
1.0000 | INHALATION_SPRAY | Freq: Two times a day (BID) | RESPIRATORY_TRACT | Status: DC
  Administered 2014-11-15 – 2014-11-19 (×8): 1 via RESPIRATORY_TRACT
  Filled 2014-11-15: qty 8.8

## 2014-11-15 NOTE — Progress Notes (Signed)
[place] at [place] NAME: Loretta Wade    MR#:  [id number]  DATE OF BIRTH:  25-Jun-1950  SUBJECTIVE:  CHIEF COMPLAINT:   Chief Complaint  Patient presents with  . Weakness  . Urinary Incontinence  . Abdominal Pain   - Obese patient with recent COPD exacerbation, discharged from the hospital, comes back with acute renal failure and hypotension. -Improving renal function. Potassium is elevated this morning. -Leg pain has improved. Continues to feel weak.  REVIEW OF SYSTEMS:  Review of Systems  Constitutional: Negative for fever and chills.  Respiratory: Positive for shortness of breath and wheezing. Negative for cough.   Cardiovascular: Negative for chest pain and palpitations.  Gastrointestinal: Positive for abdominal pain. Negative for nausea, vomiting, diarrhea and constipation.  Genitourinary: Negative for dysuria, urgency and frequency.  Musculoskeletal: Positive for myalgias.  Neurological: Positive for weakness. Negative for dizziness, tingling, tremors, speech change, seizures and headaches.  Endo/Heme/Allergies: Does not bruise/bleed easily.    DRUG ALLERGIES:  No Known Allergies  VITALS:  Blood pressure 101/47, pulse 96, temperature 98.6 F (37 C), temperature source Oral, resp. rate 16, height 5\' 4"  (1.626 m), weight 162.433 kg (358 lb 1.6 oz), SpO2 94 %.  PHYSICAL EXAMINATION:  Physical Exam  GENERAL:  64 y.o.-year-old obese patient lying in the bed with no acute distress.  EYES: Pupils equal, round, reactive to light and accommodation. No scleral icterus. Extraocular muscles intact.  HEENT: Head atraumatic, normocephalic. Oropharynx and nasopharynx clear.  NECK:  Supple, no jugular venous distention. No thyroid enlargement, no tenderness.  LUNGS:  No use of accessory muscles of respiration. Improved expiratory wheezes posteriorly, more prominent at the bases. No crackles noted. Moving air  bilaterally. CARDIOVASCULAR: S1, S2 normal. No murmurs, rubs, or gallops.  ABDOMEN: Soft, nontender, nondistended. Bowel sounds present. No organomegaly or mass. Mild discomfort generalized on abdomen palpation especially in epigastric area. EXTREMITIES: No pedal edema, cyanosis, or clubbing. No significant swelling noted. Nonerythematous. NEUROLOGIC: Cranial nerves II through XII are intact. Muscle strength 5/5 in all extremities. Sensation intact. Gait not checked.  PSYCHIATRIC: The patient is alert and oriented x 3.  SKIN: No obvious rash, lesion, or ulcer.   LABORATORY PANEL:   CBC  Recent Labs Lab 11/14/14 0528  WBC 9.6  HGB 10.4*  HCT 32.4*  PLT 187   ------------------------------------------------------------------------------------------------------------------  Chemistries   Recent Labs Lab 11/15/14 0457  NA 142  K 6.0*  CL 108  CO2 29  GLUCOSE 106*  BUN 46*  CREATININE 1.33*  CALCIUM 9.1   ------------------------------------------------------------------------------------------------------------------  Cardiac Enzymes  Recent Labs Lab 11/13/14 0930  TROPONINI <0.03   ------------------------------------------------------------------------------------------------------------------  RADIOLOGY:  US Renal  11/14/2014   CLINICAL DATA:  Acute renal failure.  EXAM: RENAL / URINARY TRACT ULTRASOUND COMPLETE  COMPARISON:  CT scan 11/13/2014.  FINDINGS: Right Kidney:  Length: 12.6 cm. Echogenicity within normal limits. No mass or hydronephrosis visualized.  Left Kidney:  Length: 11.0 cm. Echogenicity within normal limits. No mass or hydronephrosis visualized. Large simple cyst in the midpole projecting posterior laterally as predicted from CT, measuring 11.0 x 7.0 x 9.2 cm.  Bladder:  Appears normal for degree of bladder distention.  IMPRESSION: Simple LEFT renal cyst.  No hydronephrosis.   Electronically Signed   By: Staci Righter M.D.   On: 11/14/2014 15:32   US  Venous Img Lower Bilateral  11/14/2014   CLINICAL DATA:  Left calf pain.  EXAM: BILATERAL LOWER EXTREMITY VENOUS DOPPLER  ULTRASOUND  TECHNIQUE: Gray-scale sonography with graded compression, as well as color Doppler and duplex ultrasound were performed to evaluate the lower extremity deep venous systems from the level of the common femoral vein and including the common femoral, femoral, profunda femoral, popliteal and calf veins including the posterior tibial, peroneal and gastrocnemius veins when visible. The superficial great saphenous vein was also interrogated. Spectral Doppler was utilized to evaluate flow at rest and with distal augmentation maneuvers in the common femoral, femoral and popliteal veins.  COMPARISON:  None.  FINDINGS: RIGHT LOWER EXTREMITY  Common Femoral Vein: No evidence of thrombus. Normal compressibility, respiratory phasicity and response to augmentation.  Saphenofemoral Junction: No evidence of thrombus. Normal compressibility and flow on color Doppler imaging.  Profunda Femoral Vein: No evidence of thrombus. Normal compressibility and flow on color Doppler imaging.  Femoral Vein: No evidence of thrombus. Normal compressibility, respiratory phasicity and response to augmentation.  Popliteal Vein: No evidence of thrombus. Normal compressibility, respiratory phasicity and response to augmentation.  Calf Veins: No evidence of thrombus. Normal compressibility and flow on color Doppler imaging.  Superficial Great Saphenous Vein: No evidence of thrombus. Normal compressibility and flow on color Doppler imaging.  Venous Reflux:  None.  Other Findings:  None.  LEFT LOWER EXTREMITY  Common Femoral Vein: No evidence of thrombus. Normal compressibility, respiratory phasicity and response to augmentation.  Saphenofemoral Junction: No evidence of thrombus. Normal compressibility and flow on color Doppler imaging.  Profunda Femoral Vein: No evidence of thrombus. Normal compressibility and flow on color  Doppler imaging.  Femoral Vein: No evidence of thrombus. Normal compressibility, respiratory phasicity and response to augmentation.  Popliteal Vein: No evidence of thrombus. Normal compressibility, respiratory phasicity and response to augmentation.  Calf Veins: No evidence of thrombus. Normal compressibility and flow on color Doppler imaging.  Superficial Great Saphenous Vein: No evidence of thrombus. Normal compressibility and flow on color Doppler imaging.  Venous Reflux:  None.  Other Findings:  None.  IMPRESSION: No evidence of deep venous thrombosis seen in either lower extremity.   Electronically Signed   By: Marijo Conception, M.D.   On: 11/14/2014 15:16    EKG:   Orders placed or performed during the hospital encounter of 11/13/14  . ED EKG  . ED EKG  . EKG 12-Lead  . EKG 12-Lead  . ED EKG  . ED EKG    ASSESSMENT AND PLAN:   64 year old obese female with history of COPD on 2 L home oxygen, congestive heart failure, diastolic dysfunction, fibromyalgia and GERD presents to the hospital secondary to weakness and was noted to be in acute renal failure.  #1 acute renal failure-prerenal and also ATN. -Was discharged on Diuretics, and noted to be hypotensive and dehydrated. -Nephrotoxins on hold, diuretics held. Gentle hydration. -Improving creatinine, down from 4.8 to 1.3 this morning. -Appreciate nephrology consult -Renal ultrasound with no acute hydronephrosis. - Monitor urine output  #2 COPD exacerbation with recent bronchitis-did not finish her Levaquin or prednisone taper as outpatient. -Remains on 2 L oxygen. Continue prednisone taper and also Levaquin restarted. -Continue inhalers and do a  #3 hypotension-likely hypovolemic. -Gentle hydration, hold Lasix and hydrochlorothiazide -Hold other blood pressure medications as well.  #4 left leg improved pain. Dopplers negative for any DVT  #5 chronic diastolic CHF-currently volume depleted to getting gentle hydration. -Monitor  for any fluid overload. Diurietics on hold  #6 hyperkalemia-unknown reason. Potassium was elevated yesterday as well. Not on any potassium supplements. Kayexalate and insulin, dextrose  given this morning. - recheck later today  Physical therapy consult today.  All the records are reviewed and case discussed with Care Management/Social Workerr. Management plans discussed with the patient, family and they are in agreement.  CODE STATUS: Full code  TOTAL TIME TAKING CARE OF THIS PATIENT: 36 minutes.   POSSIBLE D/C IN 1-2 DAYS, DEPENDING ON CLINICAL CONDITION.   Gladstone Lighter M.D on 11/15/2014 at 11:37 AM  Between 7am to 6pm - Pager - [phone]  After 6pm go to [email or web address] - password [email or web address] [facility]  [place] [facility]  Office  [phone]  CC: Primary care physician; Keith Rake, MD

## 2014-11-15 NOTE — Evaluation (Signed)
Physical Therapy Evaluation Patient Details Name: Loretta Wade MRN: [id number] DOB: May 16, 1950 Today's Date: 11/15/2014   History of Present Illness  Pt here last week with COPD exacerbation, now here with renal failure.  She reports that 3 days ago she simpley could not even stand up.  Clinical Impression  Pt shows good effort with PT and willingness to participate, but she fatigues very quickly with limited activity at bed side and is very unsure of her self in standing.  She reports that she would like to go home, but that she realizes that she is not safe at this time and will need STR.     Follow Up Recommendations SNF    Equipment Recommendations       Recommendations for Other Services       Precautions / Restrictions Precautions Precautions: Fall Restrictions Weight Bearing Restrictions: No      Mobility  Bed Mobility Overal bed mobility: Needs Assistance Bed Mobility: Supine to Sit;Sit to Supine     Supine to sit: Min assist Sit to supine: Min assist   General bed mobility comments: Pt reports feeling very weak and needs heavy UE use as well as some assist with LEs from PT  Transfers Overall transfer level: Needs assistance Equipment used: Rolling walker (2 wheeled) Transfers: Sit to/from Stand Sit to Stand: Min assist         General transfer comment: Pt needs extra time to set up for standing and is heavily reliant on UEs on walker on getting up, but is able to maintain her balance.   Ambulation/Gait             General Gait Details: Pt is unable to really do any ambualtion but with great effort she is able to take ~3 side steps along EOB but she quickly needs to sit back down in exhaustion.  Stairs            Wheelchair Mobility    Modified Rankin (Stroke Patients Only)       Balance                                             Pertinent Vitals/Pain Pain Assessment:  (pt reports L side pain, non-descript)     Home Living Family/patient expects to be discharged to:: Skilled nursing facility Living Arrangements: Alone Available Help at Discharge: Family Type of Home: House                Prior Function Level of Independence: Independent with assistive device(s)         Comments: Pt reports that she is rarely out of the house.  Needs assist with bathing, and some other basic in home ADLs.     Hand Dominance        Extremity/Trunk Assessment   Upper Extremity Assessment: Generalized weakness (pt with )           Lower Extremity Assessment: Generalized weakness (unable to do SLR, reports general weakness)         Communication   Communication: No difficulties  Cognition Arousal/Alertness: Awake/alert Behavior During Therapy: WFL for tasks assessed/performed Overall Cognitive Status: Within Functional Limits for tasks assessed                      General Comments      Exercises  Assessment/Plan    PT Assessment Patient needs continued PT services  PT Diagnosis Difficulty walking;Generalized weakness   PT Problem List Decreased strength;Decreased range of motion;Decreased activity tolerance;Decreased balance;Decreased mobility;Decreased coordination;Decreased safety awareness  PT Treatment Interventions Gait training;Stair training;Functional mobility training;Therapeutic activities;Therapeutic exercise;Balance training;Neuromuscular re-education   PT Goals (Current goals can be found in the Care Plan section) Acute Rehab PT Goals Patient Stated Goal: Figure out why I'm so weak PT Goal Formulation: With patient Time For Goal Achievement: 11/29/14 Potential to Achieve Goals: Fair    Frequency Min 2X/week   Barriers to discharge        Co-evaluation               End of Session Equipment Utilized During Treatment: Gait belt Activity Tolerance: Patient limited by fatigue Patient left: with bed alarm set           Time:  4599-7741 PT Time Calculation (min) (ACUTE ONLY): 26 min   Charges:   PT Evaluation $Initial PT Evaluation Tier I: 1 Procedure     PT G Codes:       Wayne Both, PT, DPT [phone]  Kreg Shropshire 11/15/2014, 4:44 PM

## 2014-11-15 NOTE — Progress Notes (Signed)
[facility]  ROUNDING NOTE   Subjective:   Dopplers negative for DVT K 6 - kayexalate given this morning Patient is concerned with having hyperglycemia and receiving insulin  Objective:  Vital signs in last 24 hours:  Temp:  [98.5 F (36.9 C)-98.6 F (37 C)] 98.6 F (37 C) (09/11 0548) Pulse Rate:  [82-96] 96 (09/11 0548) Resp:  [16-17] 16 (09/11 0548) BP: (101-107)/(47-51) 101/47 mmHg (09/11 0548) SpO2:  [94 %-97 %] 95 % (09/11 0748) Weight:  [162.433 kg (358 lb 1.6 oz)] 162.433 kg (358 lb 1.6 oz) (09/11 0100)  Weight change: 0.045 kg (1.6 oz) Filed Weights   11/13/14 0921 11/14/14 0401 11/15/14 0100  Weight: 162.388 kg (358 lb) 160.392 kg (353 lb 9.6 oz) 162.433 kg (358 lb 1.6 oz)    Intake/Output: I/O last 3 completed shifts: In: 3463.8 [P.O.:770; I.V.:2693.8] Out: 651 [Urine:651]   Intake/Output this shift:  Total I/O In: 484 [P.O.:360; I.V.:124] Out: -   Physical Exam: General: NAD, morbidly obese,   Head: Normocephalic, atraumatic. Moist oral mucosal membranes  Eyes: Anicteric, PERRL  Neck: Supple, trachea midline  Lungs:  Clear to auscultation, breathing Shaft  Heart: Regular rate and rhythm  Abdomen:  Soft, nontender, obese  Extremities: no peripheral edema.   Neurologic: Nonfocal, moving all four extremities  Skin: No lesions       Basic Metabolic Panel:  Recent Labs Lab 11/13/14 0930 11/13/14 1629 11/14/14 0528 11/15/14 0457  NA 139 139 142 142  K 5.0 5.6* 5.4* 6.0*  CL 100* 104 108 108  CO2 29 26 28 29   GLUCOSE 116* 110* 103* 106*  BUN 95* 85* 75* 46*  CREATININE 4.88* 3.43* 2.25* 1.33*  CALCIUM 8.9 8.8* 8.6* 9.1    Liver Function Tests: No results for input(s): AST, ALT, ALKPHOS, BILITOT, PROT, ALBUMIN in the last 168 hours.  Recent Labs Lab 11/13/14 0930  LIPASE 19*   No results for input(s): AMMONIA in the last 168 hours.  CBC:  Recent Labs Lab 11/13/14 0930 11/14/14 0528  WBC 12.1* 9.6  HGB 11.4* 10.4*   HCT 35.8 32.4*  MCV 93.8 93.6  PLT 200 187    Cardiac Enzymes:  Recent Labs Lab 11/13/14 0930  TROPONINI <0.03    BNP: Invalid input(s): POCBNP  CBG: No results for input(s): GLUCAP in the last 168 hours.  Microbiology: Results for orders placed or performed during the hospital encounter of 11/13/14  Urine culture     Status: None (Preliminary result)   Collection Time: 11/13/14  9:30 AM  Result Value Ref Range Status   Specimen Description URINE, CATHETERIZED  Final   Special Requests Normal  Final   Culture   Final    >=100,000 COLONIES/mL GRAM POSITIVE COCCI IDENTIFICATION AND SUSCEPTIBILITIES TO FOLLOW 60,000 COLONIES/ml GROUP B STREP(S.AGALACTIAE)ISOLATED Virtually 100% of S. agalactiae (Group B) strains are susceptible to Penicillin.  For Penicillin-allergic patients, Erythromycin (85-95% sensitive) and Clindamycin (80% sensitive) are drugs of choice. Contact microbiology lab to request sensitivities if  needed within 7 days.    Report Status PENDING  Incomplete    Coagulation Studies: No results for input(s): LABPROT, INR in the last 72 hours.  Urinalysis:  Recent Labs  11/13/14 0930  COLORURINE YELLOW*  LABSPEC 1.011  PHURINE 5.0  GLUCOSEU NEGATIVE  HGBUR 2+*  BILIRUBINUR NEGATIVE  KETONESUR NEGATIVE  PROTEINUR NEGATIVE  NITRITE NEGATIVE  LEUKOCYTESUR TRACE*      Imaging: Ct Abdomen Pelvis Wo Contrast  11/13/2014   CLINICAL DATA:  Abdominal pain and urinary incontinence  EXAM: CT ABDOMEN AND PELVIS WITHOUT CONTRAST  TECHNIQUE: Multidetector CT imaging of the abdomen and pelvis was performed following the standard protocol without IV contrast. Oral contrast was administered.  COMPARISON:  April 27, 2014  FINDINGS: There is patchy bibasilar atelectasis, slightly more on the left than on the right.  No focal liver lesions are identified on this noncontrast enhanced study. Gallbladder is absent. There is no appreciable biliary duct dilatation.   Spleen, pancreas, and adrenals appear normal.  There is a cyst arising from the left kidney measuring 9.0 x 8.6 x 5.9 cm. There is mild scarring in the right kidney. There is no hydronephrosis on either side. There is no renal or ureteral calculus on either side. There are multiple small phleboliths which are near but separate from the ureters, particularly on the left side.  In the pelvis, the urinary bladder is midline with normal wall thickness. Uterus is absent. There is no pelvic mass or pelvic fluid collection. Appendix appears within normal limits.  There is no bowel obstruction. No free air or portal venous air. There is no appreciable ascites, adenopathy, or abscess in the abdomen or pelvis. There is atherosclerotic change in the aorta but no aneurysm. There is degenerative change in the lumbar spine. There are no blastic or lytic bone lesions.  IMPRESSION: No bowel obstruction. No bowel wall or mesenteric thickening. No abscess. Appendix appears normal.  Gallbladder and uterus absent. No renal or ureteral calculus. No hydronephrosis. Persistent large cyst arising from left kidney.   Electronically Signed   By: Lowella Grip III M.D.   On: 11/13/2014 11:13   US Renal  11/14/2014   CLINICAL DATA:  Acute renal failure.  EXAM: RENAL / URINARY TRACT ULTRASOUND COMPLETE  COMPARISON:  CT scan 11/13/2014.  FINDINGS: Right Kidney:  Length: 12.6 cm. Echogenicity within normal limits. No mass or hydronephrosis visualized.  Left Kidney:  Length: 11.0 cm. Echogenicity within normal limits. No mass or hydronephrosis visualized. Large simple cyst in the midpole projecting posterior laterally as predicted from CT, measuring 11.0 x 7.0 x 9.2 cm.  Bladder:  Appears normal for degree of bladder distention.  IMPRESSION: Simple LEFT renal cyst.  No hydronephrosis.   Electronically Signed   By: Staci Righter M.D.   On: 11/14/2014 15:32   US Venous Img Lower Bilateral  11/14/2014   CLINICAL DATA:  Left calf pain.   EXAM: BILATERAL LOWER EXTREMITY VENOUS DOPPLER ULTRASOUND  TECHNIQUE: Gray-scale sonography with graded compression, as well as color Doppler and duplex ultrasound were performed to evaluate the lower extremity deep venous systems from the level of the common femoral vein and including the common femoral, femoral, profunda femoral, popliteal and calf veins including the posterior tibial, peroneal and gastrocnemius veins when visible. The superficial great saphenous vein was also interrogated. Spectral Doppler was utilized to evaluate flow at rest and with distal augmentation maneuvers in the common femoral, femoral and popliteal veins.  COMPARISON:  None.  FINDINGS: RIGHT LOWER EXTREMITY  Common Femoral Vein: No evidence of thrombus. Normal compressibility, respiratory phasicity and response to augmentation.  Saphenofemoral Junction: No evidence of thrombus. Normal compressibility and flow on color Doppler imaging.  Profunda Femoral Vein: No evidence of thrombus. Normal compressibility and flow on color Doppler imaging.  Femoral Vein: No evidence of thrombus. Normal compressibility, respiratory phasicity and response to augmentation.  Popliteal Vein: No evidence of thrombus. Normal compressibility, respiratory phasicity and response to augmentation.  Calf Veins: No evidence  of thrombus. Normal compressibility and flow on color Doppler imaging.  Superficial Great Saphenous Vein: No evidence of thrombus. Normal compressibility and flow on color Doppler imaging.  Venous Reflux:  None.  Other Findings:  None.  LEFT LOWER EXTREMITY  Common Femoral Vein: No evidence of thrombus. Normal compressibility, respiratory phasicity and response to augmentation.  Saphenofemoral Junction: No evidence of thrombus. Normal compressibility and flow on color Doppler imaging.  Profunda Femoral Vein: No evidence of thrombus. Normal compressibility and flow on color Doppler imaging.  Femoral Vein: No evidence of thrombus. Normal  compressibility, respiratory phasicity and response to augmentation.  Popliteal Vein: No evidence of thrombus. Normal compressibility, respiratory phasicity and response to augmentation.  Calf Veins: No evidence of thrombus. Normal compressibility and flow on color Doppler imaging.  Superficial Great Saphenous Vein: No evidence of thrombus. Normal compressibility and flow on color Doppler imaging.  Venous Reflux:  None.  Other Findings:  None.  IMPRESSION: No evidence of deep venous thrombosis seen in either lower extremity.   Electronically Signed   By: Marijo Conception, M.D.   On: 11/14/2014 15:16     Medications:   . sodium chloride 75 mL/hr at 11/15/14 0939   . albuterol  2.5 mg Nebulization Q4H  . docusate sodium  100 mg Oral BID  . Fluticasone Furoate-Vilanterol  1 puff Inhalation BID  . levofloxacin  750 mg Oral Q48H  . magnesium oxide  400 mg Oral Daily  . oxybutynin  5 mg Oral Daily  . pantoprazole  40 mg Oral BID  . [START ON 11/16/2014] predniSONE  40 mg Oral Q breakfast   Followed by  . [START ON 11/17/2014] predniSONE  30 mg Oral Q breakfast   Followed by  . [START ON 11/18/2014] predniSONE  20 mg Oral Q breakfast   Followed by  . [START ON 11/19/2014] predniSONE  10 mg Oral Q breakfast  . pregabalin  150 mg Oral BID  . rOPINIRole  0.75 mg Oral QHS  . sodium chloride  3 mL Intravenous Q12H  . tiotropium  18 mcg Inhalation Daily   acetaminophen **OR** acetaminophen, acetaminophen, Chlorpheniramine-APAP, guaiFENesin-dextromethorphan, HYDROcodone-acetaminophen, ipratropium-albuterol  Assessment/ Plan:  Ms. ARAIYA TILMON is a 64 y.o. female with COPD, hypertension, obstructive sleep apnea not on CPAP, fibromyalgia, anemia, GERD, hypertension, morbid obesity who was admitted to [facility] on 11/13/2014 for acute kidney injury. Creatinine of 4.88 from baseline of 1.  1. Acute Renal Failure: with hyperkalemia: creatinine improving with IV fluids. Hypotensive on presentation.  Labs are  consistent with prerenal azotemia.  Patient was discharged with olmesartan, hydrochlorothiazide, furosemide, and etodolac.  - Overdiuresis seems to be the working diagnosis. Started on IV NS. Creatinine improving.  - Renal ultrasound reviewed with patient.  - Agree with holding diuretics (furosemide and hydrochlorothiazide), olmesartan and etodolac.  - Renally dose all medications - hyperkalemia: given kayexalate. Will continue to monitor.   2. Hypotension: holding blood pressure agents. Will continue to monitor blood pressure.   3. Diastolic congestive heart failure: chronic. Not in acute exacerbation. Continue to monitor volume status, especially with NS currently being administered   LOS: [place], [place] 9/11/201610:29 AM

## 2014-11-16 ENCOUNTER — Inpatient Hospital Stay: Payer: Medicare PPO

## 2014-11-16 ENCOUNTER — Ambulatory Visit: Payer: Medicare PPO | Admitting: *Deleted

## 2014-11-16 LAB — BASIC METABOLIC PANEL
Anion gap: 6 (ref 5–15)
BUN: 17 mg/dL (ref 6–20)
CALCIUM: 9.2 mg/dL (ref 8.9–10.3)
CO2: 30 mmol/L (ref 22–32)
Chloride: 106 mmol/L (ref 101–111)
Creatinine, Ser: 0.83 mg/dL (ref 0.44–1.00)
GFR calc Af Amer: >60 mL/min (ref >60)
GLUCOSE: 150 mg/dL — AB (ref 65–99)
Potassium: 5.2 mmol/L — ABNORMAL HIGH (ref 3.5–5.1)
Sodium: 142 mmol/L (ref 135–145)

## 2014-11-16 LAB — URINE CULTURE: SPECIAL REQUESTS: NORMAL

## 2014-11-16 MED ORDER — FLUTICASONE PROPIONATE 50 MCG/ACT NA SUSP
2.0000 | Freq: Every day | NASAL | Status: DC
  Administered 2014-11-16 – 2014-11-19 (×3): 2 via NASAL
  Filled 2014-11-16 (×2): qty 16

## 2014-11-16 MED ORDER — DOXYCYCLINE HYCLATE 100 MG PO TABS
100.0000 mg | ORAL_TABLET | Freq: Two times a day (BID) | ORAL | Status: DC
  Administered 2014-11-16 – 2014-11-19 (×7): 100 mg via ORAL
  Filled 2014-11-16 (×7): qty 1

## 2014-11-16 MED ORDER — FUROSEMIDE 10 MG/ML IJ SOLN
20.0000 mg | Freq: Once | INTRAMUSCULAR | Status: AC
  Administered 2014-11-16: 20 mg via INTRAVENOUS
  Filled 2014-11-16 (×2): qty 2

## 2014-11-16 NOTE — Clinical Social Work Placement (Signed)
   CLINICAL SOCIAL WORK PLACEMENT  NOTE  Date:  11/16/2014  Patient Details  Name: Loretta Wade MRN: [id number] Date of Birth: 10-04-1950  Clinical Social Work is seeking post-discharge placement for this patient at the [place] level of care (*CSW will initial, date and re-position this form in  chart as items are completed):  Yes   Patient/family provided with [place] Work Department's list of facilities offering this level of care within the geographic area requested by the patient (or if unable, by the patient's family).  Yes   Patient/family informed of their freedom to choose among providers that offer the needed level of care, that participate in Medicare, Medicaid or managed care program needed by the patient, have an available bed and are willing to accept the patient.  Yes   Patient/family informed of [place]'s ownership interest in [facility] and [facility], as well as of the fact that they are under no obligation to receive care at these facilities.  PASRR submitted to EDS on 11/16/14     PASRR number received on 11/16/14     Existing PASRR number confirmed on       FL2 transmitted to all facilities in geographic area requested by pt/family on       FL2 transmitted to all facilities within larger geographic area on 11/16/14     Patient informed that his/her managed care company has contracts with or will negotiate with certain facilities, including the following:            Patient/family informed of bed offers received.  Patient chooses bed at       Physician recommends and patient chooses bed at      Patient to be transferred to   on  .  Patient to be transferred to facility by       Patient family notified on   of transfer.  Name of family member notified:        PHYSICIAN       Additional Comment:    _______________________________________________ Amador Cunas, LCSW 11/16/2014, 11:31  AM

## 2014-11-16 NOTE — Progress Notes (Signed)
SW following for SNF placement for STR. Pt reports preference is [facility]. CSW spoke with Maudie Mercury at [facility] who reports they are able to extend offer pending insurance auth. FL2 on chart for MD signature. Anticipate d/c Tuesday pending auth per MD.  .Wandra Feinstein, MSW, LCSW [phone]

## 2014-11-16 NOTE — Progress Notes (Signed)
[facility] [place] NAME: Loretta Wade    MR#:  [id number]  DATE OF BIRTH:  07/27/1950  SUBJECTIVE:  CHIEF COMPLAINT:   Chief Complaint  Patient presents with  . Weakness  . Urinary Incontinence  . Abdominal Pain   - Renal function has improved a lot. Urine cultures growing MRSA in the urine. -Physical therapy recommended rehabilitation. Some wheezing noted on exam today.  REVIEW OF SYSTEMS:  Review of Systems  Constitutional: Negative for fever and chills.  Respiratory: Positive for shortness of breath and wheezing. Negative for cough.   Cardiovascular: Negative for chest pain and palpitations.  Gastrointestinal: Positive for abdominal pain. Negative for nausea, vomiting, diarrhea and constipation.  Genitourinary: Negative for dysuria, urgency and frequency.  Musculoskeletal: Positive for myalgias.  Neurological: Positive for weakness. Negative for dizziness, tingling, tremors, speech change, seizures and headaches.  Endo/Heme/Allergies: Does not bruise/bleed easily.    DRUG ALLERGIES:  No Known Allergies  VITALS:  Blood pressure 113/48, pulse 91, temperature 99.4 F (37.4 C), temperature source Oral, resp. rate 20, height 5\' 4"  (1.626 m), weight 162.433 kg (358 lb 1.6 oz), SpO2 94 %.  PHYSICAL EXAMINATION:  Physical Exam  GENERAL:  64 y.o.-year-old obese patient lying in the bed with no acute distress.  EYES: Pupils equal, round, reactive to light and accommodation. No scleral icterus. Extraocular muscles intact.  HEENT: Head atraumatic, normocephalic. Oropharynx and nasopharynx clear.  NECK:  Supple, no jugular venous distention. No thyroid enlargement, no tenderness.  LUNGS:  No use of accessory muscles of respiration.  No crackles noted. Moving air bilaterally. Coarse wheezing noted bilaterally. CARDIOVASCULAR: S1, S2 normal. No murmurs, rubs, or gallops.  ABDOMEN: Soft, nontender, nondistended. Bowel sounds  present. No organomegaly or mass.  EXTREMITIES: No pedal edema, cyanosis, or clubbing. No significant swelling noted. Nonerythematous. NEUROLOGIC: Cranial nerves II through XII are intact. Muscle strength 5/5 in all extremities. Sensation intact. Gait not checked.  PSYCHIATRIC: The patient is alert and oriented x 3.  SKIN: No obvious rash, lesion, or ulcer.   LABORATORY PANEL:   CBC  Recent Labs Lab 11/14/14 0528  WBC 9.6  HGB 10.4*  HCT 32.4*  PLT 187   ------------------------------------------------------------------------------------------------------------------  Chemistries   Recent Labs Lab 11/16/14 0427  NA 142  K 5.2*  CL 106  CO2 30  GLUCOSE 150*  BUN 17  CREATININE 0.83  CALCIUM 9.2   ------------------------------------------------------------------------------------------------------------------  Cardiac Enzymes  Recent Labs Lab 11/13/14 0930  TROPONINI <0.03   ------------------------------------------------------------------------------------------------------------------  RADIOLOGY:  Ct Head Wo Contrast  11/15/2014   CLINICAL DATA:  Headaches  EXAM: CT HEAD WITHOUT CONTRAST  TECHNIQUE: Contiguous axial images were obtained from the base of the skull through the vertex without intravenous contrast.  COMPARISON:  None.  FINDINGS: The bony calvarium is intact. Ventricles are normal size and configuration. No findings to suggest acute hemorrhage, acute infarction or space-occupying mass lesion are noted.  IMPRESSION: No acute abnormality noted.   Electronically Signed   By: Inez Catalina M.D.   On: 11/15/2014 17:41   US Renal  11/14/2014   CLINICAL DATA:  Acute renal failure.  EXAM: RENAL / URINARY TRACT ULTRASOUND COMPLETE  COMPARISON:  CT scan 11/13/2014.  FINDINGS: Right Kidney:  Length: 12.6 cm. Echogenicity within normal limits. No mass or hydronephrosis visualized.  Left Kidney:  Length: 11.0 cm. Echogenicity within normal limits. No mass or  hydronephrosis visualized. Large simple cyst in the midpole projecting posterior laterally as predicted  from CT, measuring 11.0 x 7.0 x 9.2 cm.  Bladder:  Appears normal for degree of bladder distention.  IMPRESSION: Simple LEFT renal cyst.  No hydronephrosis.   Electronically Signed   By: Staci Righter M.D.   On: 11/14/2014 15:32   US Venous Img Lower Bilateral  11/14/2014   CLINICAL DATA:  Left calf pain.  EXAM: BILATERAL LOWER EXTREMITY VENOUS DOPPLER ULTRASOUND  TECHNIQUE: Gray-scale sonography with graded compression, as well as color Doppler and duplex ultrasound were performed to evaluate the lower extremity deep venous systems from the level of the common femoral vein and including the common femoral, femoral, profunda femoral, popliteal and calf veins including the posterior tibial, peroneal and gastrocnemius veins when visible. The superficial great saphenous vein was also interrogated. Spectral Doppler was utilized to evaluate flow at rest and with distal augmentation maneuvers in the common femoral, femoral and popliteal veins.  COMPARISON:  None.  FINDINGS: RIGHT LOWER EXTREMITY  Common Femoral Vein: No evidence of thrombus. Normal compressibility, respiratory phasicity and response to augmentation.  Saphenofemoral Junction: No evidence of thrombus. Normal compressibility and flow on color Doppler imaging.  Profunda Femoral Vein: No evidence of thrombus. Normal compressibility and flow on color Doppler imaging.  Femoral Vein: No evidence of thrombus. Normal compressibility, respiratory phasicity and response to augmentation.  Popliteal Vein: No evidence of thrombus. Normal compressibility, respiratory phasicity and response to augmentation.  Calf Veins: No evidence of thrombus. Normal compressibility and flow on color Doppler imaging.  Superficial Great Saphenous Vein: No evidence of thrombus. Normal compressibility and flow on color Doppler imaging.  Venous Reflux:  None.  Other Findings:  None.   LEFT LOWER EXTREMITY  Common Femoral Vein: No evidence of thrombus. Normal compressibility, respiratory phasicity and response to augmentation.  Saphenofemoral Junction: No evidence of thrombus. Normal compressibility and flow on color Doppler imaging.  Profunda Femoral Vein: No evidence of thrombus. Normal compressibility and flow on color Doppler imaging.  Femoral Vein: No evidence of thrombus. Normal compressibility, respiratory phasicity and response to augmentation.  Popliteal Vein: No evidence of thrombus. Normal compressibility, respiratory phasicity and response to augmentation.  Calf Veins: No evidence of thrombus. Normal compressibility and flow on color Doppler imaging.  Superficial Great Saphenous Vein: No evidence of thrombus. Normal compressibility and flow on color Doppler imaging.  Venous Reflux:  None.  Other Findings:  None.  IMPRESSION: No evidence of deep venous thrombosis seen in either lower extremity.   Electronically Signed   By: Marijo Conception, M.D.   On: 11/14/2014 15:16    EKG:   Orders placed or performed during the hospital encounter of 11/13/14  . ED EKG  . ED EKG  . EKG 12-Lead  . EKG 12-Lead  . ED EKG  . ED EKG    ASSESSMENT AND PLAN:   64 year old obese female with history of COPD on 2 L home oxygen, congestive heart failure, diastolic dysfunction, fibromyalgia and GERD presents to the hospital secondary to weakness and was noted to be in acute renal failure.  #1 acute renal failure-prerenal and also ATN. -Was discharged on Diuretics, and noted to be hypotensive and dehydrated. -Nephrotoxins on hold, diuretics held.  - Resolved renal failure -Appreciate nephrology consult -Renal ultrasound with no acute hydronephrosis. - Monitor urine output  #2 COPD exacerbation with recent bronchitis-did not finish her Levaquin or prednisone taper as outpatient. -Remains on 2 L oxygen. Continue prednisone taper and also Levaquin restarted. -Continue inhalers and duo  nebs  #3 hypotension-likely hypovolemic.  Improved now. -Discontinue IV fluids. Continue to hold Lasix and hydrochlorothiazide. -Hold other blood pressure medications as well.  #4 urinary tract infection-urine cultures growing MRSA. Will start on vancomycin. We'll not do Bactrim due to recent renal failure and also hyperkalemia. -Check with pharmacy if linezolid will work.  #5 chronic diastolic CHF-mild wheezing noted on exam today. Discontinue fluids and give a dose of Lasix. Has been well compensated during this hospital stay otherwise.  #6 hyperkalemia-unknown reason. Contracted with Kayexalate   Physical therapy consulted, recommended rehabilitation.  All the records are reviewed and case discussed with Care Management/Social Workerr. Management plans discussed with the patient, family and they are in agreement.  CODE STATUS: Full code  TOTAL TIME TAKING CARE OF THIS PATIENT: 36 minutes.   POSSIBLE D/C TOMORROW, DEPENDING ON CLINICAL CONDITION.   Gladstone Lighter M.D on 11/16/2014 at 12:08 PM  Between 7am to 6pm - Pager - [phone]  After 6pm go to [email or web address] - password [email or web address] [facility]  [facility]  Office  [phone]  CC: Primary care physician; Keith Rake, MD

## 2014-11-16 NOTE — Care Management Important Message (Signed)
Important Message  Patient Details  Name: Loretta Wade MRN: [id number] Date of Birth: Jul 15, 1950   Medicare Important Message Given:  Yes-second notification given    Beau Fanny, RN 11/16/2014, 10:50 AM

## 2014-11-16 NOTE — Progress Notes (Signed)
[facility]  ROUNDING NOTE   Subjective:   Dopplers negative for DVT S Cr is now in normal range Patient reports cramping in abdomen - likely from Kayexalate  Objective:  Vital signs in last 24 hours:  Temp:  [99 F (37.2 C)-100.2 F (37.9 C)] 99.4 F (37.4 C) (09/12 0758) Pulse Rate:  [91-102] 91 (09/12 0758) Resp:  [14-20] 20 (09/12 0758) BP: (107-118)/(36-55) 113/48 mmHg (09/12 0818) SpO2:  [90 %-96 %] 94 % (09/12 0758)  Weight change:  Filed Weights   11/13/14 0921 11/14/14 0401 11/15/14 0100  Weight: 162.388 kg (358 lb) 160.392 kg (353 lb 9.6 oz) 162.433 kg (358 lb 1.6 oz)    Intake/Output: I/O last 3 completed shifts: In: 2891 [P.O.:600; I.V.:2291] Out: -    Intake/Output this shift:  Total I/O In: 212 [I.V.:212] Out: -   Physical Exam: General: NAD, morbidly obese,   Head: Normocephalic, atraumatic. Moist oral mucosal membranes  Eyes: Anicteric,   Neck: Supple, trachea midline  Lungs:  Clear to auscultation, breathing [place]  Heart: Regular rate and rhythm  Abdomen:  Soft, nontender, obese  Extremities: no peripheral edema.   Neurologic: Nonfocal, moving all four extremities  Skin: No lesions       Basic Metabolic Panel:  Recent Labs Lab 11/13/14 0930 11/13/14 1629 11/14/14 0528 11/15/14 0457 11/16/14 0427  NA 139 139 142 142 142  K 5.0 5.6* 5.4* 6.0* 5.2*  CL 100* 104 108 108 106  CO2 29 26 28 29 30   GLUCOSE 116* 110* 103* 106* 150*  BUN 95* 85* 75* 46* 17  CREATININE 4.88* 3.43* 2.25* 1.33* 0.83  CALCIUM 8.9 8.8* 8.6* 9.1 9.2    Liver Function Tests: No results for input(s): AST, ALT, ALKPHOS, BILITOT, PROT, ALBUMIN in the last 168 hours.  Recent Labs Lab 11/13/14 0930  LIPASE 19*   No results for input(s): AMMONIA in the last 168 hours.  CBC:  Recent Labs Lab 11/13/14 0930 11/14/14 0528  WBC 12.1* 9.6  HGB 11.4* 10.4*  HCT 35.8 32.4*  MCV 93.8 93.6  PLT 200 187    Cardiac Enzymes:  Recent Labs Lab  11/13/14 0930  TROPONINI <0.03    BNP: Invalid input(s): POCBNP  CBG: No results for input(s): GLUCAP in the last 168 hours.  Microbiology: Results for orders placed or performed during the hospital encounter of 11/13/14  Urine culture     Status: None (Preliminary result)   Collection Time: 11/13/14  9:30 AM  Result Value Ref Range Status   Specimen Description URINE, CATHETERIZED  Final   Special Requests Normal  Final   Culture   Final    >=100,000 COLONIES/mL GRAM POSITIVE COCCI IDENTIFICATION AND SUSCEPTIBILITIES TO FOLLOW 60,000 COLONIES/ml GROUP B STREP(S.AGALACTIAE)ISOLATED Virtually 100% of S. agalactiae (Group B) strains are susceptible to Penicillin.  For Penicillin-allergic patients, Erythromycin (85-95% sensitive) and Clindamycin (80% sensitive) are drugs of choice. Contact microbiology lab to request sensitivities if  needed within 7 days.    Report Status PENDING  Incomplete    Coagulation Studies: No results for input(s): LABPROT, INR in the last 72 hours.  Urinalysis:  Recent Labs  11/13/14 0930  COLORURINE YELLOW*  LABSPEC 1.011  PHURINE 5.0  GLUCOSEU NEGATIVE  HGBUR 2+*  BILIRUBINUR NEGATIVE  KETONESUR NEGATIVE  PROTEINUR NEGATIVE  NITRITE NEGATIVE  LEUKOCYTESUR TRACE*      Imaging: Ct Head Wo Contrast  11/15/2014   CLINICAL DATA:  Headaches  EXAM: CT HEAD WITHOUT CONTRAST  TECHNIQUE: Contiguous axial  images were obtained from the base of the skull through the vertex without intravenous contrast.  COMPARISON:  None.  FINDINGS: The bony calvarium is intact. Ventricles are normal size and configuration. No findings to suggest acute hemorrhage, acute infarction or space-occupying mass lesion are noted.  IMPRESSION: No acute abnormality noted.   Electronically Signed   By: Inez Catalina M.D.   On: 11/15/2014 17:41   US Renal  11/14/2014   CLINICAL DATA:  Acute renal failure.  EXAM: RENAL / URINARY TRACT ULTRASOUND COMPLETE  COMPARISON:  CT scan  11/13/2014.  FINDINGS: Right Kidney:  Length: 12.6 cm. Echogenicity within normal limits. No mass or hydronephrosis visualized.  Left Kidney:  Length: 11.0 cm. Echogenicity within normal limits. No mass or hydronephrosis visualized. Large simple cyst in the midpole projecting posterior laterally as predicted from CT, measuring 11.0 x 7.0 x 9.2 cm.  Bladder:  Appears normal for degree of bladder distention.  IMPRESSION: Simple LEFT renal cyst.  No hydronephrosis.   Electronically Signed   By: Staci Righter M.D.   On: 11/14/2014 15:32   US Venous Img Lower Bilateral  11/14/2014   CLINICAL DATA:  Left calf pain.  EXAM: BILATERAL LOWER EXTREMITY VENOUS DOPPLER ULTRASOUND  TECHNIQUE: Gray-scale sonography with graded compression, as well as color Doppler and duplex ultrasound were performed to evaluate the lower extremity deep venous systems from the level of the common femoral vein and including the common femoral, femoral, profunda femoral, popliteal and calf veins including the posterior tibial, peroneal and gastrocnemius veins when visible. The superficial great saphenous vein was also interrogated. Spectral Doppler was utilized to evaluate flow at rest and with distal augmentation maneuvers in the common femoral, femoral and popliteal veins.  COMPARISON:  None.  FINDINGS: RIGHT LOWER EXTREMITY  Common Femoral Vein: No evidence of thrombus. Normal compressibility, respiratory phasicity and response to augmentation.  Saphenofemoral Junction: No evidence of thrombus. Normal compressibility and flow on color Doppler imaging.  Profunda Femoral Vein: No evidence of thrombus. Normal compressibility and flow on color Doppler imaging.  Femoral Vein: No evidence of thrombus. Normal compressibility, respiratory phasicity and response to augmentation.  Popliteal Vein: No evidence of thrombus. Normal compressibility, respiratory phasicity and response to augmentation.  Calf Veins: No evidence of thrombus. Normal  compressibility and flow on color Doppler imaging.  Superficial Great Saphenous Vein: No evidence of thrombus. Normal compressibility and flow on color Doppler imaging.  Venous Reflux:  None.  Other Findings:  None.  LEFT LOWER EXTREMITY  Common Femoral Vein: No evidence of thrombus. Normal compressibility, respiratory phasicity and response to augmentation.  Saphenofemoral Junction: No evidence of thrombus. Normal compressibility and flow on color Doppler imaging.  Profunda Femoral Vein: No evidence of thrombus. Normal compressibility and flow on color Doppler imaging.  Femoral Vein: No evidence of thrombus. Normal compressibility, respiratory phasicity and response to augmentation.  Popliteal Vein: No evidence of thrombus. Normal compressibility, respiratory phasicity and response to augmentation.  Calf Veins: No evidence of thrombus. Normal compressibility and flow on color Doppler imaging.  Superficial Great Saphenous Vein: No evidence of thrombus. Normal compressibility and flow on color Doppler imaging.  Venous Reflux:  None.  Other Findings:  None.  IMPRESSION: No evidence of deep venous thrombosis seen in either lower extremity.   Electronically Signed   By: Marijo Conception, M.D.   On: 11/14/2014 15:16     Medications:   . sodium chloride 75 mL/hr at 11/15/14 2356   . docusate sodium  100 mg  Oral BID  . levofloxacin  750 mg Oral Q48H  . magnesium oxide  400 mg Oral Daily  . mometasone-formoterol  1 puff Inhalation BID  . oxybutynin  5 mg Oral Daily  . pantoprazole  40 mg Oral BID  . [START ON 11/17/2014] predniSONE  30 mg Oral Q breakfast   Followed by  . [START ON 11/18/2014] predniSONE  20 mg Oral Q breakfast   Followed by  . [START ON 11/19/2014] predniSONE  10 mg Oral Q breakfast  . pregabalin  150 mg Oral BID  . rOPINIRole  0.75 mg Oral QHS  . sodium chloride  3 mL Intravenous Q12H  . tiotropium  18 mcg Inhalation Daily   acetaminophen **OR** acetaminophen, acetaminophen, albuterol,  Chlorpheniramine-APAP, guaiFENesin-dextromethorphan, HYDROcodone-acetaminophen, ipratropium-albuterol  Assessment/ Plan:  Loretta Wade is a 64 y.o. female with COPD, hypertension, obstructive sleep apnea not on CPAP, fibromyalgia, anemia, GERD, hypertension, morbid obesity who was admitted to [facility] on 11/13/2014 for acute kidney injury. Creatinine of 4.88 from baseline of 1.  1. Acute Renal Failure: with hyperkalemia: creatinine improving with IV fluids. Hypotensive on presentation.   Patient was discharged with olmesartan, hydrochlorothiazide, furosemide, and etodolac.  - avoid NSAIDs if possible - Overdiuresis in setting of ARB seems to be the working diagnosis. Started on IV NS. Creatinine improved to baseline - Agree with holding diuretics (furosemide and hydrochlorothiazide), olmesartan and etodolac.  - Renally dose all medications - hyperkalemia: given kayexalate.  - low K diet   2. Hypotension: holding blood pressure agents. Will continue to monitor blood pressure.   3. Diastolic congestive heart failure: chronic. Not in acute exacerbation. Continue to monitor volume status, especially with NS currently being administered  Will sign of Please call if there are any further questions   LOS: 3 Loretta Wade 9/12/20169:23 AM

## 2014-11-17 ENCOUNTER — Telehealth: Payer: Self-pay | Admitting: Family Medicine

## 2014-11-17 DIAGNOSIS — N3000 Acute cystitis without hematuria: Secondary | ICD-10-CM

## 2014-11-17 DIAGNOSIS — A4902 Methicillin resistant Staphylococcus aureus infection, unspecified site: Secondary | ICD-10-CM

## 2014-11-17 LAB — BASIC METABOLIC PANEL
ANION GAP: 7 (ref 5–15)
BUN: 12 mg/dL (ref 6–20)
CALCIUM: 9.3 mg/dL (ref 8.9–10.3)
CHLORIDE: 101 mmol/L (ref 101–111)
CO2: 31 mmol/L (ref 22–32)
Creatinine, Ser: 0.89 mg/dL (ref 0.44–1.00)
GFR calc non Af Amer: >60 mL/min (ref >60)
GLUCOSE: 95 mg/dL (ref 65–99)
Potassium: 4.8 mmol/L (ref 3.5–5.1)
Sodium: 139 mmol/L (ref 135–145)

## 2014-11-17 MED ORDER — ACETAMINOPHEN 325 MG PO TABS: 650.0000 mg | ORAL_TABLET | Freq: Four times a day (QID) | ORAL | Status: DC | PRN

## 2014-11-17 MED ORDER — HYDROCODONE-ACETAMINOPHEN 5-325 MG PO TABS: 1.0000 | ORAL_TABLET | ORAL | Status: DC | PRN

## 2014-11-17 MED ORDER — IPRATROPIUM-ALBUTEROL 0.5-2.5 (3) MG/3ML IN SOLN: 3.0000 mL | Freq: Four times a day (QID) | RESPIRATORY_TRACT | Status: DC

## 2014-11-17 MED ORDER — FUROSEMIDE 20 MG PO TABS: 20.0000 mg | ORAL_TABLET | ORAL | Status: DC

## 2014-11-17 MED ORDER — METHYLPREDNISOLONE SODIUM SUCC 125 MG IJ SOLR
60.0000 mg | Freq: Once | INTRAMUSCULAR | Status: AC
  Administered 2014-11-17: 60 mg via INTRAVENOUS
  Filled 2014-11-17: qty 2

## 2014-11-17 MED ORDER — OXYBUTYNIN CHLORIDE 5 MG PO TABS: 5.0000 mg | ORAL_TABLET | Freq: Every day | ORAL | Status: DC

## 2014-11-17 MED ORDER — DOXYCYCLINE HYCLATE 100 MG PO TABS: 100.0000 mg | ORAL_TABLET | Freq: Two times a day (BID) | ORAL | Status: DC

## 2014-11-17 NOTE — Discharge Summary (Addendum)
[facility] [place] NAME: Loretta Wade    MR#:  [id number]  DATE OF BIRTH:  Jul 04, 1950  DATE OF ADMISSION:  11/13/2014 ADMITTING PHYSICIAN: Theodoro Grist, MD  DATE OF DISCHARGE: 11/19/2014  PRIMARY CARE PHYSICIAN: Keith Rake, MD    ADMISSION DIAGNOSIS:  Abdominal pain, generalized [R10.84] Dehydration [E86.0] Nausea & vomiting [R11.2] Acute kidney injury [N17.9]  DISCHARGE DIAGNOSIS:  Principal Problem:   ARF (acute renal failure) Active Problems:   COPD exacerbation   MRSA infection   Acute cystitis   SECONDARY DIAGNOSIS:   Past Medical History  Diagnosis Date  . OA (osteoarthritis)   . Fibromyalgia   . Anemia   . Sleep apnea   . Fatigue   . Edema   . Esophageal reflux   . Nocturia   . Hypertension   . COPD (chronic obstructive pulmonary disease)     HOSPITAL COURSE:   64 year old obese female with history of COPD on 2 L home oxygen, congestive heart failure, diastolic dysfunction, fibromyalgia and GERD presents to the hospital secondary to weakness and was noted to be in acute renal failure.  #1 Acute renal failure-prerenal and also ATN. -Was on Diuretics, and noted to be hypotensive and dehydrated. -Nephrotoxins on hold, diuretics held.  - Resolved renal failure -Appreciate nephrology consult -Renal ultrasound with no acute hydronephrosis.  #2 COPD exacerbation with recent bronchitis- -Remains on 2 L oxygen. Continue prednisone taper and on doxycycline. -Continue inhalers and duo nebs  #3 hypotension-likely hypovolemic. Improved now. -Continue to hold hydrochlorothiazide. - lasix every other day at discharge -Hold other blood pressure medications as well.  #4 Urinary tract infection-urine cultures growing MRSA.  -Started on doxycycline for 10 days total - repeat UA normal  #5 chronic diastolic CHF-mild wheezing noted on exam, improved with Lasix.Marland Kitchen Has been well compensated during this hospital  stay otherwise.  #6 Left shoulder pain- couldn't fit in MRI, so a CT was done and it showed severe glenohumeral osteoarthritis, mild rotator cuff diffuse atrophy, no complete tears noted - on prednisone anyway - Orthopedics f/u as outpatient  #7 Low back pain with right leg radicular pain- MRI of lumbar spine-formainal narrowing- mild at L2-L5 levels, no significant nerve compression - Physical therapy  Physical therapy consulted, recommended rehabilitation. Patient to be discharged today.  DISCHARGE CONDITIONS:   Guarded  CONSULTS OBTAINED:    nephrology consultation by Dr. Candiss Norse  DRUG ALLERGIES:  No Known Allergies  DISCHARGE MEDICATIONS:   Current Discharge Medication List    START taking these medications   Details  albuterol (PROVENTIL) (2.5 MG/3ML) 0.083% nebulizer solution Take 3 mLs (2.5 mg total) by nebulization every 6 (six) hours as needed for wheezing or shortness of breath. Qty: 75 mL, Refills: 12    doxycycline (VIBRA-TABS) 100 MG tablet Take 1 tablet (100 mg total) by mouth 2 (two) times daily. Qty: 18 tablet, Refills: 0    HYDROcodone-acetaminophen (NORCO/VICODIN) 5-325 MG per tablet Take 1-2 tablets by mouth every 4 (four) hours as needed for moderate pain. Qty: 20 tablet, Refills: 0    oxybutynin (DITROPAN) 5 MG tablet Take 1 tablet (5 mg total) by mouth daily. Qty: 30 tablet, Refills: 0    senna-docusate (SENOKOT-S) 8.6-50 MG per tablet Take 2 tablets by mouth daily. Qty: 60 tablet, Refills: 0      CONTINUE these medications which have CHANGED   Details  acetaminophen (TYLENOL) 325 MG tablet Take 2 tablets (650 mg total) by mouth every 6 (  six) hours as needed for mild pain (or Fever >/= 101). Qty: 30 tablet, Refills: 0    furosemide (LASIX) 20 MG tablet Take 1 tablet (20 mg total) by mouth every other day. Qty: 30 tablet, Refills: 0    ipratropium-albuterol (DUONEB) 0.5-2.5 (3) MG/3ML SOLN Take 3 mLs by nebulization every 6 (six) hours. Qty: 360  mL, Refills: 0      CONTINUE these medications which have NOT CHANGED   Details  Chlorpheniramine-Acetaminophen (CORICIDIN HBP COLD/FLU PO) Take 2 tablets by mouth daily as needed (for nasal congestion/cough).     cholecalciferol (VITAMIN D) 1000 UNITS tablet Take 1,000 Units by mouth daily.     dexlansoprazole (DEXILANT) 60 MG capsule Take 60 mg by mouth daily.    Fluticasone Furoate-Vilanterol 100-25 MCG/INH AEPB Inhale 1 puff into the lungs 2 (two) times daily.     guaiFENesin-dextromethorphan (ROBITUSSIN DM) 100-10 MG/5ML syrup Take 10 mLs by mouth every 4 (four) hours as needed for cough.    Magnesium Oxide 250 MG TABS Take 250 mg by mouth daily.    pantoprazole (PROTONIX) 40 MG tablet Take 1 tablet (40 mg total) by mouth 2 (two) times daily. Qty: 60 tablet, Refills: 0    predniSONE (STERAPRED UNI-PAK 21 TAB) 10 MG (21) TBPK tablet Take 1 tablet (10 mg total) by mouth daily. Take 6 tabs first day, 5 tab on day 2, then 4 on day 3rd, 3 tabs on day 4th , 2 tab on day 5th, and 1 tab on 6th day. Qty: 21 tablet, Refills: 0    pregabalin (LYRICA) 150 MG capsule Take 150 mg by mouth 2 (two) times daily.    rOPINIRole (REQUIP) 0.25 MG tablet Take 3 tablets (0.75 mg total) by mouth at bedtime. Qty: 90 tablet, Refills: 2   Associated Diagnoses: RLS (restless legs syndrome)    tiotropium (SPIRIVA) 18 MCG inhalation capsule Place 18 mcg into inhaler and inhale daily.      STOP taking these medications     albuterol (PROVENTIL HFA;VENTOLIN HFA) 108 (90 BASE) MCG/ACT inhaler      etodolac (LODINE) 300 MG capsule      hydrochlorothiazide (HYDRODIURIL) 25 MG tablet      olmesartan (BENICAR) 20 MG tablet      propranolol ER (INDERAL LA) 80 MG 24 hr capsule      moxifloxacin (AVELOX) 400 MG tablet          DISCHARGE INSTRUCTIONS:   1. PCP follow-up in 1 week 2. Please do a bladder scan after voiding in 3 days  If you experience worsening of your admission symptoms, develop  shortness of breath, life threatening emergency, suicidal or homicidal thoughts you must seek medical attention immediately by calling 911 or calling your MD immediately  if symptoms less severe.  You Must read complete instructions/literature along with all the possible adverse reactions/side effects for all the Medicines you take and that have been prescribed to you. Take any new Medicines after you have completely understood and accept all the possible adverse reactions/side effects.   Please note  You were cared for by a hospitalist during your hospital stay. If you have any questions about your discharge medications or the care you received while you were in the hospital after you are discharged, you can call the unit and asked to speak with the hospitalist on call if the hospitalist that took care of you is not available. Once you are discharged, your primary care physician will handle any further medical issues.  Please note that NO REFILLS for any discharge medications will be authorized once you are discharged, as it is imperative that you return to your primary care physician (or establish a relationship with a primary care physician if you do not have one) for your aftercare needs so that they can reassess your need for medications and monitor your lab values.    Today   CHIEF COMPLAINT:   Chief Complaint  Patient presents with  . Weakness  . Urinary Incontinence  . Abdominal Pain    VITAL SIGNS:  Blood pressure 112/58, pulse 99, temperature 98.9 F (37.2 C), temperature source Oral, resp. rate 17, height 5\' 4"  (1.626 m), weight 158.215 kg (348 lb 12.8 oz), SpO2 94 %.  I/O:   Intake/Output Summary (Last 24 hours) at 11/17/14 0957 Last data filed at 11/17/14 0738  Gross per 24 hour  Intake   1366 ml  Output    304 ml  Net   1062 ml    PHYSICAL EXAMINATION:   Physical Exam  GENERAL: 64 y.o.-year-old obese patient lying in the bed with no acute distress.  EYES: Pupils  equal, round, reactive to light and accommodation. No scleral icterus. Extraocular muscles intact.  HEENT: Head atraumatic, normocephalic. Oropharynx and nasopharynx clear.  NECK: Supple, no jugular venous distention. No thyroid enlargement, no tenderness.  LUNGS: No use of accessory muscles of respiration. No crackles noted. Moving air bilaterally. Coarse wheezing noted bilaterally. CARDIOVASCULAR: S1, S2 normal. No murmurs, rubs, or gallops.  ABDOMEN: Soft, nontender, nondistended. Bowel sounds present. No organomegaly or mass.  EXTREMITIES: No pedal edema, cyanosis, or clubbing. No significant swelling noted.  Left shoulder- limited abduction and external rotation of the shoulder Positive right straight leg rising test, pain in the low back, right groin, radiating to right leg NEUROLOGIC: Cranial nerves II through XII are intact. Muscle strength 5/5 in all extremities. Sensation intact. Gait not checked.  PSYCHIATRIC: The patient is alert and oriented x 3.  SKIN: No obvious rash, lesion, or ulcer.   DATA REVIEW:   CBC  Recent Labs Lab 11/14/14 0528  WBC 9.6  HGB 10.4*  HCT 32.4*  PLT 187    Chemistries   Recent Labs Lab 11/17/14 0408  NA 139  K 4.8  CL 101  CO2 31  GLUCOSE 95  BUN 12  CREATININE 0.89  CALCIUM 9.3    Cardiac Enzymes  Recent Labs Lab 11/13/14 0930  TROPONINI <0.03    Microbiology Results  Results for orders placed or performed during the hospital encounter of 11/13/14  Urine culture     Status: None   Collection Time: 11/13/14  9:30 AM  Result Value Ref Range Status   Specimen Description URINE, CATHETERIZED  Final   Special Requests Normal  Final   Culture   Final    >=100,000 COLONIES/mL METHICILLIN RESISTANT STAPHYLOCOCCUS AUREUS 60,000 COLONIES/ml GROUP B STREP(S.AGALACTIAE)ISOLATED Virtually 100% of S. agalactiae (Group B) strains are susceptible to Penicillin.  For Penicillin-allergic patients, Erythromycin (85-95%  sensitive) and Clindamycin (80% sensitive) are drugs of choice. Contact microbiology lab to request sensitivities if  needed within 7 days. CRITICAL RESULT CALLED TO, READ BACK BY AND VERIFIED WITH: Villa Herb AT [id number] 11/16/14 CTJ WITH MIXED BACTERIAL ORGANISMS    Report Status 11/16/2014 FINAL  Final   Organism ID, Bacteria METHICILLIN RESISTANT STAPHYLOCOCCUS AUREUS  Final      Susceptibility   Methicillin resistant staphylococcus aureus - MIC*    CIPROFLOXACIN >=8 RESISTANT Resistant  ERYTHROMYCIN >=8 RESISTANT Resistant     GENTAMICIN <=0.5 SENSITIVE Sensitive     OXACILLIN >=4 RESISTANT Resistant     VANCOMYCIN 1 SENSITIVE Sensitive     TRIMETH/SULFA <=10 SENSITIVE Sensitive     CLINDAMYCIN >=8 RESISTANT Resistant     CEFOXITIN SCREEN Value in next row Resistant      POSITIVECEFOXITIN SCREEN - This test may be used to predict mecA-mediated oxacillin resistance, and it is based on the cefoxitin disk screen test.  The cefoxitin screen and oxacillin work in combination to determine the final interpretation reported for oxacillin.     Inducible Clindamycin Value in next row Sensitive      POSITIVECEFOXITIN SCREEN - This test may be used to predict mecA-mediated oxacillin resistance, and it is based on the cefoxitin disk screen test.  The cefoxitin screen and oxacillin work in combination to determine the final interpretation reported for oxacillin.     TETRACYCLINE Value in next row Sensitive      SENSITIVE<=1    LINEZOLID Value in next row Sensitive      SENSITIVE2    * >=100,000 COLONIES/mL METHICILLIN RESISTANT STAPHYLOCOCCUS AUREUS    RADIOLOGY:  Ct Head Wo Contrast  11/15/2014   CLINICAL DATA:  Headaches  EXAM: CT HEAD WITHOUT CONTRAST  TECHNIQUE: Contiguous axial images were obtained from the base of the skull through the vertex without intravenous contrast.  COMPARISON:  None.  FINDINGS: The bony calvarium is intact. Ventricles are normal size and configuration. No findings to  suggest acute hemorrhage, acute infarction or space-occupying mass lesion are noted.  IMPRESSION: No acute abnormality noted.   Electronically Signed   By: Inez Catalina M.D.   On: 11/15/2014 17:41   Dg Chest Port 1 View  11/16/2014   CLINICAL DATA:  Acute renal failure, shortness of breath today.  EXAM: PORTABLE CHEST - 1 VIEW  COMPARISON:  11/04/2014  FINDINGS: Low lung volumes. Heart size is accentuated by the low volumes, likely mildly enlarged. Mild vascular congestion. No confluent opacity or effusion. No acute bony abnormality.  IMPRESSION: Low lung volumes.  Mild cardiomegaly.  No acute findings.   Electronically Signed   By: Rolm Baptise M.D.   On: 11/16/2014 13:02    EKG:   Orders placed or performed during the hospital encounter of 11/13/14  . ED EKG  . ED EKG  . EKG 12-Lead  . EKG 12-Lead  . ED EKG  . ED EKG      Management plans discussed with the patient, family and they are in agreement.  CODE STATUS:     Code Status Orders        Start     Ordered   11/13/14 1303  Full code   Continuous     11/13/14 1302      TOTAL TIME TAKING CARE OF THIS PATIENT: 38 minutes.    Gladstone Lighter M.D on 11/17/2014 at 9:57 AM  Between 7am to 6pm - Pager - [phone]  After 6pm go to [email or web address] - password [email or web address] [facility]  [facility]  Office  [phone]  CC: Primary care physician; Keith Rake, MD

## 2014-11-17 NOTE — Telephone Encounter (Signed)
Last seen on 11-12-14 and was admitted into the hospital on 11-13-14. Requesting a return call to discuss doing testing while she is in the hospital. Please return call

## 2014-11-17 NOTE — Telephone Encounter (Signed)
Routed note to [name] for Advice

## 2014-11-17 NOTE — Clinical Social Work Note (Signed)
More updated PT notes were sent for ins auth.  Pt also needs bariatric bed.  Facility aware.  CSW will f/u tomorrow

## 2014-11-17 NOTE — Progress Notes (Signed)
Physical Therapy Treatment Patient Details Name: Loretta Wade MRN: [id number] DOB: 1950/09/03 Today's Date: 11/17/2014    History of Present Illness Pt here last week with COPD exacerbation, now here with renal failure.  She reports that 3 days ago she simply could not even stand up.    PT Comments    Pt demonstrates severe weakness in bilateral LE with transfers and marching at EOB. She is currently unsafe to attempt froward/retro ambulation due to LE buckling and severe LE weakness. Pt reports prior to admission she was ambulatory at home with rolling walker. Pt concerned about RLE weakness but does not present with notable RLE weakness with MMT. Pt will need SNF placement at discharge as she present with generalized deconditioning and weakness placing her at increased risk for future falls.    Follow Up Recommendations  SNF     Equipment Recommendations   (To be determined by SNF)    Recommendations for Other Services       Precautions / Restrictions Precautions Precautions: Fall Restrictions Weight Bearing Restrictions: No    Mobility  Bed Mobility Overal bed mobility: Needs Assistance Bed Mobility: Supine to Sit;Sit to Supine     Supine to sit: Min assist Sit to supine: Min assist   General bed mobility comments: Pt requires assist from sidelying to sitting and for bilateral LE when returning to bed. Heavy rail use for UE support  Transfers Overall transfer level: Needs assistance Equipment used: Rolling walker (2 wheeled) (bariatric) Transfers: Sit to/from Stand Sit to Stand: Min assist         General transfer comment: Pt needs assistance with hand placement and safe setup at EOB. Pt demonstrates decreased LE power and strength with some posterior leaning but able to self correct for balance deficits  Ambulation/Gait Ambulation/Gait assistance: Mod assist   Assistive device: Rolling walker (2 wheeled) (bariatric)       General Gait Details: Pt  performs marching and side stepping at EOB with therapist. She demonstrates DOE however SaO2 remains >90% on 2 L/min O2 throughout session. Pt with increase in RR and increased anxiety. Pt unsafe to attempt forward/backward ambulation due to RLE buckling and LE weakness. Pt is a high risk for falls   Stairs            Wheelchair Mobility    Modified Rankin (Stroke Patients Only)       Balance Overall balance assessment: Needs assistance   Sitting balance-[name] Scale: Fair       Standing balance-[name] Scale: Fair                      Cognition Arousal/Alertness: Awake/alert Behavior During Therapy: WFL for tasks assessed/performed Overall Cognitive Status: Within Functional Limits for tasks assessed                      Exercises General Exercises - Lower Extremity Long Arc Quad: Strengthening;Both;10 reps;Seated Heel Slides: Strengthening;Both;10 reps;Supine (Performed x 10 in sitting) Hip ABduction/ADduction: Strengthening;Both;10 reps;Supine Straight Leg Raises: Strengthening;Both;10 reps;Supine Hip Flexion/Marching: Strengthening;Both;10 reps;Seated    General Comments        Pertinent Vitals/Pain Pain Assessment: 0-10 Pain Score: 7  Pain Location: L chest pain, unchanged since admission Pain Intervention(s): Monitored during session    Home Living                      Prior Function  PT Goals (current goals can now be found in the care plan section) Acute Rehab PT Goals Patient Stated Goal: Figure out why I'm so weak PT Goal Formulation: With patient Time For Goal Achievement: 11/29/14 Potential to Achieve Goals: Fair Progress towards PT goals: Progressing toward goals    Frequency  Min 2X/week    PT Plan Current plan remains appropriate    Co-evaluation             End of Session Equipment Utilized During Treatment: Gait belt Activity Tolerance: Patient limited by fatigue Patient left: with bed  alarm set;with call bell/phone within reach;in bed     Time: 7530-0511 PT Time Calculation (min) (ACUTE ONLY): 25 min  Charges:  $Therapeutic Exercise: 23-37 mins                    G Codes:      Lyndel Safe Huprich PT, DPT   Huprich,Jason 11/17/2014, 4:05 PM

## 2014-11-18 ENCOUNTER — Inpatient Hospital Stay: Payer: Medicare PPO

## 2014-11-18 LAB — BASIC METABOLIC PANEL
ANION GAP: 8 (ref 5–15)
BUN: 16 mg/dL (ref 6–20)
CO2: 30 mmol/L (ref 22–32)
Calcium: 9.2 mg/dL (ref 8.9–10.3)
Chloride: 99 mmol/L — ABNORMAL LOW (ref 101–111)
Creatinine, Ser: 0.92 mg/dL (ref 0.44–1.00)
GFR calc Af Amer: >60 mL/min (ref >60)
GFR calc non Af Amer: >60 mL/min (ref >60)
GLUCOSE: 172 mg/dL — AB (ref 65–99)
POTASSIUM: 4.6 mmol/L (ref 3.5–5.1)
Sodium: 137 mmol/L (ref 135–145)

## 2014-11-18 LAB — URINALYSIS COMPLETE WITH MICROSCOPIC (ARMC ONLY)
BACTERIA UA: NONE SEEN
Bilirubin Urine: NEGATIVE
Glucose, UA: NEGATIVE mg/dL
Hgb urine dipstick: NEGATIVE
Ketones, ur: NEGATIVE mg/dL
LEUKOCYTES UA: NEGATIVE
Nitrite: NEGATIVE
PH: 8 (ref 5.0–8.0)
PROTEIN: NEGATIVE mg/dL
Specific Gravity, Urine: 1.013 (ref 1.005–1.030)

## 2014-11-18 NOTE — Progress Notes (Signed)
Patient c/o chest pain at 12:45am. She was given a Norco and PRN breathing treatment. MD Marcille Blanco was notified of patient complaints and gave new order for 12 lead EKG. EKG was completed and showed NSR 90. Patient slept well throughout the night with no other complaints of pain.

## 2014-11-18 NOTE — Progress Notes (Signed)
Received call from MRI that pt reports metal in her eye; requesting orbital xray.  Dr. Tressia Miners paged and orders received and entered.

## 2014-11-18 NOTE — Progress Notes (Signed)
Spoke with Dr. Tressia Miners to notify of pt shoulder pain radiating down arm; also burning with urination.  No new orders received.

## 2014-11-18 NOTE — Progress Notes (Signed)
[place] [facility] [place] NAME: Loretta Wade    MR#:  [id number]  DATE OF BIRTH:  1950-07-08  SUBJECTIVE:  CHIEF COMPLAINT:   Chief Complaint  Patient presents with  . Weakness  . Urinary Incontinence  . Abdominal Pain   - supposed to be discharged to rehab last evening, but held due to insurance authorization issues - complains of worsening left shoulder pain- limited motion on exam- going on for a few weeks now- worse now - also has low back pain radiating to her right leg  REVIEW OF SYSTEMS:  Review of Systems  Constitutional: Negative for fever and chills.  Respiratory: Positive for shortness of breath and wheezing. Negative for cough.   Cardiovascular: Negative for chest pain and palpitations.  Gastrointestinal: Negative for nausea, vomiting, abdominal pain, diarrhea and constipation.  Genitourinary: Negative for dysuria, urgency and frequency.  Musculoskeletal: Positive for myalgias, back pain and joint pain.  Neurological: Positive for weakness. Negative for dizziness, tingling, tremors, speech change, seizures and headaches.  Endo/Heme/Allergies: Does not bruise/bleed easily.    DRUG ALLERGIES:  No Known Allergies  VITALS:  Blood pressure 141/67, pulse 91, temperature 98.1 F (36.7 C), temperature source Oral, resp. rate 19, height 5\' 4"  (1.626 m), weight 168.466 kg (371 lb 6.4 oz), SpO2 97 %.  PHYSICAL EXAMINATION:  Physical Exam  GENERAL:  64 y.o.-year-old obese patient lying in the bed with no acute distress.  EYES: Pupils equal, round, reactive to light and accommodation. No scleral icterus. Extraocular muscles intact.  HEENT: Head atraumatic, normocephalic. Oropharynx and nasopharynx clear.  NECK:  Supple, no jugular venous distention. No thyroid enlargement, no tenderness.  LUNGS:  No use of accessory muscles of respiration.  No crackles noted. Moving air bilaterally. Coarse wheezing noted  bilaterally. CARDIOVASCULAR: S1, S2 normal. No murmurs, rubs, or gallops.  ABDOMEN: Soft, nontender, nondistended. Bowel sounds present. No organomegaly or mass.  EXTREMITIES: No pedal edema, cyanosis, or clubbing. No significant swelling noted.  Left shoulder- limited abduction and external rotation of the shoulder Positive right straight leg rising test, pain in the low back, right groin, radiating to right leg NEUROLOGIC: Cranial nerves II through XII are intact. Muscle strength 5/5 in all extremities. Sensation intact. Gait not checked.  PSYCHIATRIC: The patient is alert and oriented x 3.  SKIN: No obvious rash, lesion, or ulcer.   LABORATORY PANEL:   CBC  Recent Labs Lab 11/14/14 0528  WBC 9.6  HGB 10.4*  HCT 32.4*  PLT 187   ------------------------------------------------------------------------------------------------------------------  Chemistries   Recent Labs Lab 11/17/14 0408  NA 139  K 4.8  CL 101  CO2 31  GLUCOSE 95  BUN 12  CREATININE 0.89  CALCIUM 9.3   ------------------------------------------------------------------------------------------------------------------  Cardiac Enzymes  Recent Labs Lab 11/13/14 0930  TROPONINI <0.03   ------------------------------------------------------------------------------------------------------------------  RADIOLOGY:  Dg Eye Foreign Body  11/18/2014   CLINICAL DATA:  Metal working/exposure; clearance prior to MRI  EXAM: ORBITS FOR FOREIGN BODY - 2 VIEW  COMPARISON:  None.  FINDINGS: Water's views with eyes deviated toward the left and toward the right were obtained. There is no intraorbital radiopaque foreign body. Frontal sinuses are hypoplastic. Aerated paranasal sinuses are clear. No fracture or dislocation.  IMPRESSION: No evidence of metallic foreign body within the orbits.   Electronically Signed   By: Lowella Grip III M.D.   On: 11/18/2014 12:46    EKG:   Orders placed or performed during the  hospital encounter of  11/13/14  . ED EKG  . ED EKG  . EKG 12-Lead  . EKG 12-Lead  . ED EKG  . ED EKG  . EKG 12-Lead  . EKG 12-Lead    ASSESSMENT AND PLAN:   64 year old obese female with history of COPD on 2 L home oxygen, congestive heart failure, diastolic dysfunction, fibromyalgia and GERD presents to the hospital secondary to weakness and was noted to be in acute renal failure.  #1 acute renal failure-prerenal and also ATN.  -Was discharged on Diuretics, and noted to be hypotensive and dehydrated. -Nephrotoxins on hold, diuretics held.  - Resolved renal failure -Appreciate nephrology consult -Renal ultrasound with no acute hydronephrosis.  #2 COPD exacerbation with recent bronchitis-did not finish her Levaquin or prednisone taper as outpatient. -Remains on 2 L oxygen. Continue prednisone taper and also Levaquin restarted. -Continue inhalers and duo nebs  #3 hypotension-likely hypovolemic on admission. Improved now. -off IV fluids. Continue to hold hydrochlorothiazide. On low dose lasix -Hold other blood pressure medications as well.  #4 urinary tract infection-urine cultures growing MRSA.  - on doxycycline now, still complaining of dysuria- will recheck urine  #5 chronic diastolic CHF- fluids discontinued and started on low dose lasix  #6 hyperkalemia-unknown reason. Treated with Kayexalate. Resolved now.  #7 left shoulder pain- with limited movements- likely rotator cuff pathology -MRI shoulder - also MRI lumbar spine for low back pain with right leg radiculopathy  Physical therapy consulted, recommended rehabilitation.  All the records are reviewed and case discussed with Care Management/Social Workerr. Management plans discussed with the patient, family and they are in agreement.  CODE STATUS: Full code  TOTAL TIME TAKING CARE OF THIS PATIENT: 40 minutes.   POSSIBLE D/C TOMORROW, DEPENDING ON CLINICAL CONDITION.   Gladstone Lighter M.D on 11/18/2014 at 1:02  PM  Between 7am to 6pm - Pager - [phone]  After 6pm go to [email or web address] - password [email or web address] [facility]  [facility] [facility]  Office  [phone]  CC: Primary care physician; Keith Rake, MD

## 2014-11-18 NOTE — Care Management Important Message (Signed)
Important Message  Patient Details  Name: Loretta Wade MRN: [id number] Date of Birth: 1951-01-24   Medicare Important Message Given:  Yes-third notification given    Beau Fanny, RN 11/18/2014, 9:44 AM

## 2014-11-18 NOTE — Progress Notes (Signed)
Physical Therapy Treatment Patient Details Name: Loretta Wade MRN: [id number] DOB: 07-Mar-1950 Today's Date: 11/18/2014    History of Present Illness Pt here last week with COPD exacerbation, now here with renal failure.  She reports that 3 days ago she simpley could not even stand up.    PT Comments    Pt presents with increased fatigue today during bed exercises. She takes extended time to complete all bed exercises. Vitals monitored throughout and SaO2 remains >90% on 2 L/min supplemental O2. Pt requires rest breaks during exercise due to fatigue and DOE. At end of exercises pt urinated in bed so unable to attempt bed mobility or transfers at this time. Pt is awaiting imaging of her shoulder per RN due to reports of pain. Pt will continue to benefit from skilled PT services to address deficits in mobility, strength, and balance in order to improve function at home.   Follow Up Recommendations  SNF     Equipment Recommendations  Other (comment) (To be determined at [facility])    Recommendations for Other Services       Precautions / Restrictions Precautions Precautions: Fall Restrictions Weight Bearing Restrictions: No    Mobility  Bed Mobility               General bed mobility comments: Deferred at this time. Following bed exercises pt urinated in bed and requires CNA to come clean her up  Transfers                    Ambulation/Gait                 Stairs            Wheelchair Mobility    Modified Rankin (Stroke Patients Only)       Balance                                    Cognition Arousal/Alertness: Awake/alert Behavior During Therapy: WFL for tasks assessed/performed Overall Cognitive Status: Within Functional Limits for tasks assessed                      Exercises General Exercises - Upper Extremity Elbow Flexion: Strengthening;Both;15 reps;Supine Elbow Extension: Strengthening;Both;15  reps;Supine General Exercises - Lower Extremity Ankle Circles/Pumps: Strengthening;Both;20 reps;Supine Quad Sets: Strengthening;Both;15 reps;Supine Gluteal Sets: Strengthening;Both;15 reps;Supine Heel Slides: Strengthening;Both;15 reps;Supine    General Comments        Pertinent Vitals/Pain Pain Assessment: 0-10 Pain Score: 7  Pain Location: L chest radiating into L arm and L leg Pain Intervention(s): Monitored during session    Home Living                      Prior Function            PT Goals (current goals can now be found in the care plan section) Acute Rehab PT Goals Patient Stated Goal: Figure out why I'm so weak PT Goal Formulation: With patient Time For Goal Achievement: 11/29/14 Potential to Achieve Goals: Fair Progress towards PT goals: Not progressing toward goals - comment (Pt is limited by fatigue, L shoulder pain currently)    Frequency  Min 2X/week    PT Plan Current plan remains appropriate    Co-evaluation             End of Session   Activity Tolerance: Patient limited by  fatigue Patient left: with bed alarm set;with call bell/phone within reach;in bed     Time: 1130-1155 PT Time Calculation (min) (ACUTE ONLY): 25 min  Charges:  $Therapeutic Exercise: 23-37 mins                    G Codes:      Loretta Wade PT, DPT   Loretta Wade 11/18/2014, 11:56 AM

## 2014-11-18 NOTE — Clinical Social Work Note (Signed)
Kim at [facility] has stated that they have received auth from [employer] and have received a bariatric bed. MD states patient not yet ready for discharge today and is expecting discharge tomorrow. CSW has updated Maudie Mercury at [facility]. Shela Leff MSW,LCSW [phone]

## 2014-11-19 ENCOUNTER — Encounter
Admission: RE | Admit: 2014-11-19 | Discharge: 2014-11-19 | Disposition: A | Discharge status: Home / Self Care | Payer: Medicare PPO | Source: Ambulatory Visit | Attending: Internal Medicine | Admitting: Internal Medicine

## 2014-11-19 DIAGNOSIS — Z8614 Personal history of Methicillin resistant Staphylococcus aureus infection: Secondary | ICD-10-CM | POA: Insufficient documentation

## 2014-11-19 MED ORDER — SENNOSIDES-DOCUSATE SODIUM 8.6-50 MG PO TABS: 2.0000 | ORAL_TABLET | Freq: Every day | ORAL | Status: DC

## 2014-11-19 MED ORDER — DOXYCYCLINE HYCLATE 100 MG PO TABS: 100.0000 mg | ORAL_TABLET | Freq: Two times a day (BID) | ORAL | Status: AC

## 2014-11-19 MED ORDER — BISACODYL 5 MG PO TBEC
10.0000 mg | DELAYED_RELEASE_TABLET | Freq: Every day | ORAL | Status: DC
  Administered 2014-11-19: 10 mg via ORAL
  Filled 2014-11-19: qty 2

## 2014-11-19 MED ORDER — LACTULOSE 10 GM/15ML PO SOLN
30.0000 g | Freq: Every day | ORAL | Status: DC | PRN
  Administered 2014-11-19: 30 g via ORAL
  Filled 2014-11-19: qty 60

## 2014-11-19 MED ORDER — ALBUTEROL SULFATE (2.5 MG/3ML) 0.083% IN NEBU: 2.5000 mg | INHALATION_SOLUTION | Freq: Four times a day (QID) | RESPIRATORY_TRACT | Status: DC | PRN

## 2014-11-19 NOTE — Clinical Social Work Placement (Signed)
   CLINICAL SOCIAL WORK PLACEMENT  NOTE  Date:  11/19/2014  Patient Details  Name: Loretta Wade MRN: [id number] Date of Birth: [birth year]  Clinical Social Work is seeking post-discharge placement for this patient at the [place] level of care (*CSW will initial, date and re-position this form in  chart as items are completed):  Yes   Patient/family provided with [place] Work Department's list of facilities offering this level of care within the geographic area requested by the patient (or if unable, by the patient's family).  Yes   Patient/family informed of their freedom to choose among providers that offer the needed level of care, that participate in Medicare, Medicaid or managed care program needed by the patient, have an available bed and are willing to accept the patient.  Yes   Patient/family informed of [facility]'s ownership interest in [facility] and [facility], as well as of the fact that they are under no obligation to receive care at these facilities.  PASRR submitted to EDS on 11/16/14     PASRR number received on 11/16/14     Existing PASRR number confirmed on       FL2 transmitted to all facilities in geographic area requested by pt/family on       FL2 transmitted to all facilities within larger geographic area on 11/16/14     Patient informed that his/her managed care company has contracts with or will negotiate with certain facilities, including the following:        Yes   Patient/family informed of bed offers received.  Patient chooses bed at  [facility])     Physician recommends and patient chooses bed at  [facility])    Patient to be transferred to  [facility]) on 11/19/14.  Patient to be transferred to facility by  (EMS)     Patient family notified on 11/19/14 of transfer.  Name of family member notified:   (Patient notified her family)     PHYSICIAN Please sign FL2     Additional Comment:     _______________________________________________ Shela Leff, LCSW 11/19/2014, 11:46 AM

## 2014-11-19 NOTE — Progress Notes (Signed)
Alert and oriented. Vss. No signs of acute distress. Report given to [place] at [facility]. No other issues observed.

## 2014-11-19 NOTE — Clinical Social Work Note (Signed)
MD to discharge patient today to [facility]. Patient to transport via EMS. Discharge summary sent to [facility]. Kim at [facility] provided room and report number. Patient states to CSW that she has notified her family of her discharge and that I do not need to call anyone on her behalf. Shela Leff MSW,LCSW [phone]

## 2014-11-26 ENCOUNTER — Ambulatory Visit: Payer: Medicare PPO | Admitting: Family Medicine

## 2014-11-26 ENCOUNTER — Encounter: Payer: Self-pay | Admitting: *Deleted

## 2014-11-26 DIAGNOSIS — N179 Acute kidney failure, unspecified: Secondary | ICD-10-CM

## 2014-11-26 NOTE — Patient Outreach (Signed)
[facility]) Care Management  11/26/2014  Loretta Wade [birth year] [id number]   Documentation:  Referral for [facility] SW sent -  Request  f/u with pt as view in Epic shows pt discharged to  [facility] 9/15.                               In past 6 months- 2 admits, 3 ED visits.  This RN CM to f/u with pt at discharge from SNF.     Zara Chess.   [facility]  [phone]

## 2014-12-01 DIAGNOSIS — Z8614 Personal history of Methicillin resistant Staphylococcus aureus infection: Secondary | ICD-10-CM | POA: Diagnosis present

## 2014-12-01 LAB — URINALYSIS COMPLETE WITH MICROSCOPIC (ARMC ONLY)
Bilirubin Urine: NEGATIVE
Glucose, UA: NEGATIVE mg/dL
KETONES UR: NEGATIVE mg/dL
Nitrite: NEGATIVE
PH: 6 (ref 5.0–8.0)
PROTEIN: NEGATIVE mg/dL
Specific Gravity, Urine: 1.008 (ref 1.005–1.030)

## 2014-12-04 NOTE — Telephone Encounter (Signed)
Returned call and left a voice message for [employer]

## 2014-12-04 NOTE — Telephone Encounter (Signed)
Returned call and left a voice message. 

## 2014-12-05 ENCOUNTER — Encounter
Admission: RE | Admit: 2014-12-05 | Discharge: 2014-12-05 | Disposition: A | Discharge status: Home / Self Care | Payer: Medicare PPO | Source: Ambulatory Visit | Attending: Internal Medicine | Admitting: Internal Medicine

## 2014-12-05 DIAGNOSIS — R410 Disorientation, unspecified: Secondary | ICD-10-CM | POA: Insufficient documentation

## 2014-12-05 LAB — URINE CULTURE: Culture: 50000

## 2014-12-08 DIAGNOSIS — R410 Disorientation, unspecified: Secondary | ICD-10-CM | POA: Diagnosis not present

## 2014-12-08 LAB — CBC WITH DIFFERENTIAL/PLATELET
BASOS ABS: 0 10*3/uL (ref 0–0.1)
BASOS PCT: 1 %
EOS PCT: 4 %
Eosinophils Absolute: 0.1 10*3/uL (ref 0–0.7)
HEMATOCRIT: 34.1 % — AB (ref 35.0–47.0)
Hemoglobin: 10.7 g/dL — ABNORMAL LOW (ref 12.0–16.0)
Lymphocytes Relative: 32 %
Lymphs Abs: 1.2 10*3/uL (ref 1.0–3.6)
MCH: 29.5 pg (ref 26.0–34.0)
MCHC: 31.5 g/dL — ABNORMAL LOW (ref 32.0–36.0)
MCV: 93.6 fL (ref 80.0–100.0)
MONO ABS: 0.7 10*3/uL (ref 0.2–0.9)
Monocytes Relative: 18 %
NEUTROS ABS: 1.7 10*3/uL (ref 1.4–6.5)
Neutrophils Relative %: 45 %
PLATELETS: 219 10*3/uL (ref 150–440)
RBC: 3.64 MIL/uL — AB (ref 3.80–5.20)
RDW: 15.8 % — AB (ref 11.5–14.5)
WBC: 3.8 10*3/uL (ref 3.6–11.0)

## 2014-12-08 LAB — COMPREHENSIVE METABOLIC PANEL
ALBUMIN: 3.3 g/dL — AB (ref 3.5–5.0)
ALT: 21 U/L (ref 14–54)
AST: 20 U/L (ref 15–41)
Alkaline Phosphatase: 71 U/L (ref 38–126)
Anion gap: 7 (ref 5–15)
BUN: 7 mg/dL (ref 6–20)
CHLORIDE: 100 mmol/L — AB (ref 101–111)
CO2: 32 mmol/L (ref 22–32)
Calcium: 9 mg/dL (ref 8.9–10.3)
Creatinine, Ser: 0.64 mg/dL (ref 0.44–1.00)
GFR calc Af Amer: >60 mL/min (ref >60)
GFR calc non Af Amer: >60 mL/min (ref >60)
GLUCOSE: 123 mg/dL — AB (ref 65–99)
POTASSIUM: 4.1 mmol/L (ref 3.5–5.1)
Sodium: 139 mmol/L (ref 135–145)
Total Bilirubin: 0.8 mg/dL (ref 0.3–1.2)
Total Protein: 6.2 g/dL — ABNORMAL LOW (ref 6.5–8.1)

## 2014-12-09 DIAGNOSIS — J449 Chronic obstructive pulmonary disease, unspecified: Secondary | ICD-10-CM

## 2014-12-09 DIAGNOSIS — Z515 Encounter for palliative care: Secondary | ICD-10-CM

## 2014-12-09 DIAGNOSIS — I5030 Unspecified diastolic (congestive) heart failure: Secondary | ICD-10-CM

## 2014-12-09 DIAGNOSIS — M797 Fibromyalgia: Secondary | ICD-10-CM

## 2014-12-14 DIAGNOSIS — R410 Disorientation, unspecified: Secondary | ICD-10-CM | POA: Diagnosis not present

## 2014-12-14 LAB — CBC
HCT: 35.1 % (ref 35.0–47.0)
HEMOGLOBIN: 11 g/dL — AB (ref 12.0–16.0)
MCH: 28.8 pg (ref 26.0–34.0)
MCHC: 31.2 g/dL — ABNORMAL LOW (ref 32.0–36.0)
MCV: 92.2 fL (ref 80.0–100.0)
Platelets: 298 10*3/uL (ref 150–440)
RBC: 3.8 MIL/uL (ref 3.80–5.20)
RDW: 15.8 % — ABNORMAL HIGH (ref 11.5–14.5)
WBC: 5.3 10*3/uL (ref 3.6–11.0)

## 2014-12-14 LAB — COMPREHENSIVE METABOLIC PANEL
ALBUMIN: 3.5 g/dL (ref 3.5–5.0)
ALT: 21 U/L (ref 14–54)
AST: 21 U/L (ref 15–41)
Alkaline Phosphatase: 75 U/L (ref 38–126)
Anion gap: 6 (ref 5–15)
BILIRUBIN TOTAL: 0.6 mg/dL (ref 0.3–1.2)
BUN: 8 mg/dL (ref 6–20)
CHLORIDE: 99 mmol/L — AB (ref 101–111)
CO2: 34 mmol/L — ABNORMAL HIGH (ref 22–32)
Calcium: 9.2 mg/dL (ref 8.9–10.3)
Creatinine, Ser: 0.85 mg/dL (ref 0.44–1.00)
GFR calc Af Amer: >60 mL/min (ref >60)
GFR calc non Af Amer: >60 mL/min (ref >60)
GLUCOSE: 119 mg/dL — AB (ref 65–99)
POTASSIUM: 4.1 mmol/L (ref 3.5–5.1)
Sodium: 139 mmol/L (ref 135–145)
Total Protein: 6.7 g/dL (ref 6.5–8.1)

## 2014-12-14 LAB — AMMONIA: AMMONIA: 27 umol/L (ref 9–35)

## 2014-12-15 ENCOUNTER — Emergency Department: Payer: Medicare PPO

## 2014-12-15 ENCOUNTER — Encounter: Payer: Self-pay | Admitting: Emergency Medicine

## 2014-12-15 ENCOUNTER — Emergency Department
Admission: EM | Admit: 2014-12-15 | Discharge: 2014-12-15 | Disposition: A | Discharge status: Home / Self Care | Payer: Medicare PPO | Attending: Emergency Medicine | Admitting: Emergency Medicine

## 2014-12-15 DIAGNOSIS — Z79899 Other long term (current) drug therapy: Secondary | ICD-10-CM | POA: Diagnosis not present

## 2014-12-15 DIAGNOSIS — Z87891 Personal history of nicotine dependence: Secondary | ICD-10-CM | POA: Insufficient documentation

## 2014-12-15 DIAGNOSIS — R0602 Shortness of breath: Secondary | ICD-10-CM | POA: Diagnosis present

## 2014-12-15 DIAGNOSIS — Z7951 Long term (current) use of inhaled steroids: Secondary | ICD-10-CM | POA: Insufficient documentation

## 2014-12-15 DIAGNOSIS — J441 Chronic obstructive pulmonary disease with (acute) exacerbation: Secondary | ICD-10-CM | POA: Insufficient documentation

## 2014-12-15 DIAGNOSIS — Z9981 Dependence on supplemental oxygen: Secondary | ICD-10-CM | POA: Insufficient documentation

## 2014-12-15 LAB — COMPREHENSIVE METABOLIC PANEL
ALT: 20 U/L (ref 14–54)
ANION GAP: 6 (ref 5–15)
AST: 26 U/L (ref 15–41)
Albumin: 3.6 g/dL (ref 3.5–5.0)
Alkaline Phosphatase: 72 U/L (ref 38–126)
BUN: 8 mg/dL (ref 6–20)
CHLORIDE: 101 mmol/L (ref 101–111)
CO2: 33 mmol/L — ABNORMAL HIGH (ref 22–32)
CREATININE: 0.77 mg/dL (ref 0.44–1.00)
Calcium: 9.2 mg/dL (ref 8.9–10.3)
Glucose, Bld: 113 mg/dL — ABNORMAL HIGH (ref 65–99)
POTASSIUM: 4.2 mmol/L (ref 3.5–5.1)
Sodium: 140 mmol/L (ref 135–145)
Total Bilirubin: 0.7 mg/dL (ref 0.3–1.2)
Total Protein: 6.8 g/dL (ref 6.5–8.1)

## 2014-12-15 LAB — CBC WITH DIFFERENTIAL/PLATELET
BASOS ABS: 0.1 10*3/uL (ref 0–0.1)
Basophils Relative: 1 %
EOS PCT: 1 %
Eosinophils Absolute: 0.1 10*3/uL (ref 0–0.7)
HCT: 33.4 % — ABNORMAL LOW (ref 35.0–47.0)
Hemoglobin: 10.7 g/dL — ABNORMAL LOW (ref 12.0–16.0)
LYMPHS PCT: 26 %
Lymphs Abs: 1.3 10*3/uL (ref 1.0–3.6)
MCH: 29.4 pg (ref 26.0–34.0)
MCHC: 31.9 g/dL — ABNORMAL LOW (ref 32.0–36.0)
MCV: 92.2 fL (ref 80.0–100.0)
Monocytes Absolute: 0.8 10*3/uL (ref 0.2–0.9)
Monocytes Relative: 16 %
NEUTROS ABS: 2.8 10*3/uL (ref 1.4–6.5)
NEUTROS PCT: 56 %
PLATELETS: 286 10*3/uL (ref 150–440)
RBC: 3.63 MIL/uL — AB (ref 3.80–5.20)
RDW: 15.9 % — ABNORMAL HIGH (ref 11.5–14.5)
WBC: 5.1 10*3/uL (ref 3.6–11.0)

## 2014-12-15 LAB — LIPASE, BLOOD: LIPASE: 17 U/L — AB (ref 22–51)

## 2014-12-15 MED ORDER — GI COCKTAIL ~~LOC~~
30.0000 mL | ORAL | Status: AC
Start: 2014-12-15 — End: 2014-12-15
  Administered 2014-12-15: 30 mL via ORAL
  Filled 2014-12-15: qty 30

## 2014-12-15 MED ORDER — DOXYCYCLINE HYCLATE 100 MG PO TABS
100.0000 mg | ORAL_TABLET | Freq: Once | ORAL | Status: AC
  Administered 2014-12-15: 100 mg via ORAL
  Filled 2014-12-15: qty 1

## 2014-12-15 MED ORDER — IPRATROPIUM-ALBUTEROL 0.5-2.5 (3) MG/3ML IN SOLN
9.0000 mL | Freq: Once | RESPIRATORY_TRACT | Status: AC
  Administered 2014-12-15: 9 mL via RESPIRATORY_TRACT
  Filled 2014-12-15: qty 9

## 2014-12-15 MED ORDER — METHYLPREDNISOLONE SODIUM SUCC 125 MG IJ SOLR
125.0000 mg | Freq: Once | INTRAMUSCULAR | Status: AC
  Administered 2014-12-15: 125 mg via INTRAVENOUS
  Filled 2014-12-15: qty 2

## 2014-12-15 MED ORDER — SODIUM CHLORIDE 0.9 % IV BOLUS (SEPSIS)
1000.0000 mL | Freq: Once | INTRAVENOUS | Status: AC
  Administered 2014-12-15: 1000 mL via INTRAVENOUS

## 2014-12-15 MED ORDER — PREDNISONE 20 MG PO TABS: 40.0000 mg | ORAL_TABLET | Freq: Every day | ORAL | Status: DC

## 2014-12-15 MED ORDER — DOXYCYCLINE HYCLATE 100 MG PO CAPS: 100.0000 mg | ORAL_CAPSULE | Freq: Two times a day (BID) | ORAL | Status: DC

## 2014-12-15 MED ORDER — FAMOTIDINE 20 MG PO TABS
40.0000 mg | ORAL_TABLET | Freq: Once | ORAL | Status: AC
  Administered 2014-12-15: 40 mg via ORAL
  Filled 2014-12-15: qty 2

## 2014-12-15 NOTE — ED Notes (Signed)
CRITICAL VALUE ALERT  Critical value received:  0.04  Date of notification:  12/15/14  Time of notification:  1310  Critical value read back:Yes.     Nurse who received alert:  Rush Landmark, RN  MD notified (1st page):  [place]

## 2014-12-15 NOTE — ED Notes (Signed)
PT via ems for chest pain and shortness of breath. Pt has chronic CP and complains of left sided chest pain. Pt is stable at this time.

## 2014-12-15 NOTE — Discharge Instructions (Signed)

## 2014-12-15 NOTE — ED Provider Notes (Signed)
[facility] Emergency Department Provider Note  ____________________________________________  Time seen: 12:25 PM on arrival by EMS  I have reviewed the triage vital signs and the nursing notes.   HISTORY  Chief Complaint Shortness of Breath    HPI Loretta Wade is a 64 y.o. female who complains of left-sided chest  Pain that hurts with breathing that is intermittent and consistent with chronic recurrent pain. She also reports shortness of breath with a nonproductive cough over the last 24 hours. Been using her inhalers without relief. Denies any recent trauma. No fevers chills syncope dizziness. Uses 2 L nasal cannula oxygen at home for COPD    Past Medical History  Diagnosis Date  . OA (osteoarthritis)   . Fibromyalgia   . Anemia   . Sleep apnea   . Fatigue   . Edema   . Esophageal reflux   . Nocturia   . Hypertension   . COPD (chronic obstructive pulmonary disease) [facility])      Patient Active Problem List   Diagnosis Date Noted  . MRSA infection 11/17/2014  . Acute cystitis 11/17/2014  . ARF (acute renal failure) ([place]) 11/13/2014  . Diastolic CHF, acute on chronic (HCC) 11/12/2014  . Morbid obesity with BMI of 50.0-59.9, adult ([place]) 11/12/2014  . Neck pain, chronic 11/12/2014  . COPD exacerbation ([place]) 11/04/2014  . Urinary tract infection 10/02/2014     Past Surgical History  Procedure Laterality Date  . Cholecystectomy    . Total knee arthroplasty      right  . Vesicovaginal fistula closure w/ tah    . Abdominal hysterectomy       Current Outpatient Rx  Name  Route  Sig  Dispense  Refill  . acetaminophen (TYLENOL) 325 MG tablet   Oral   Take 2 tablets (650 mg total) by mouth every 6 (six) hours as needed for mild pain (or Fever >/= 101).   30 tablet   0   . albuterol (PROVENTIL) (2.5 MG/3ML) 0.083% nebulizer solution   Nebulization   Take 3 mLs (2.5 mg total) by nebulization every 6 (six) hours as needed for wheezing  or shortness of breath.   75 mL   12   . Chlorpheniramine-Acetaminophen (CORICIDIN HBP COLD/FLU PO)   Oral   Take 2 tablets by mouth daily as needed (for nasal congestion/cough).          . cholecalciferol (VITAMIN D) 1000 UNITS tablet   Oral   Take 1,000 Units by mouth daily.          Marland Kitchen dexlansoprazole (DEXILANT) 60 MG capsule   Oral   Take 60 mg by mouth daily.         Marland Kitchen doxycycline (VIBRAMYCIN) 100 MG capsule   Oral   Take 1 capsule (100 mg total) by mouth 2 (two) times daily.   28 capsule   0   . Fluticasone Furoate-Vilanterol 100-25 MCG/INH AEPB   Inhalation   Inhale 1 puff into the lungs 2 (two) times daily.          . furosemide (LASIX) 20 MG tablet   Oral   Take 1 tablet (20 mg total) by mouth every other day.   30 tablet   0   . guaiFENesin-dextromethorphan (ROBITUSSIN DM) 100-10 MG/5ML syrup   Oral   Take 10 mLs by mouth every 4 (four) hours as needed for cough.         Marland Kitchen HYDROcodone-acetaminophen (NORCO/VICODIN) 5-325 MG per tablet  Oral   Take 1-2 tablets by mouth every 4 (four) hours as needed for moderate pain.   20 tablet   0   . ipratropium-albuterol (DUONEB) 0.5-2.5 (3) MG/3ML SOLN   Nebulization   Take 3 mLs by nebulization every 6 (six) hours.   360 mL   0   . Magnesium Oxide 250 MG TABS   Oral   Take 250 mg by mouth daily.         Marland Kitchen oxybutynin (DITROPAN) 5 MG tablet   Oral   Take 1 tablet (5 mg total) by mouth daily.   30 tablet   0   . pantoprazole (PROTONIX) 40 MG tablet   Oral   Take 1 tablet (40 mg total) by mouth 2 (two) times daily.   60 tablet   0   . predniSONE (DELTASONE) 20 MG tablet   Oral   Take 2 tablets (40 mg total) by mouth daily.   8 tablet   0   . pregabalin (LYRICA) 150 MG capsule   Oral   Take 150 mg by mouth 2 (two) times daily.         Marland Kitchen rOPINIRole (REQUIP) 0.25 MG tablet   Oral   Take 3 tablets (0.75 mg total) by mouth at bedtime.   90 tablet   2   . senna-docusate (SENOKOT-S)  8.6-50 MG per tablet   Oral   Take 2 tablets by mouth daily.   60 tablet   0   . tiotropium (SPIRIVA) 18 MCG inhalation capsule   Inhalation   Place 18 mcg into inhaler and inhale daily.            Allergies Review of patient's allergies indicates no known allergies.   Family History  Problem Relation Age of Onset  . Hypertension Mother   . Diabetes    . Breast cancer Daughter     Social History Social History  Substance Use Topics  . Smoking status: Former Smoker    Quit date: 11/04/1983  . Smokeless tobacco: None  . Alcohol Use: No    Review of Systems  Constitutional:   No fever or chills. No weight changes Eyes:   No blurry vision or double vision.  ENT:   No sore throat. Cardiovascular:   Positive chest pain as aboven. Respiratory:  Positive shortness of breath and nonproductive cough. Gastrointestinal:   Negative for abdominal pain, vomiting and diarrhea.  No BRBPR or melena. Genitourinary:   Negative for dysuria, urinary retention, bloody urine, or difficulty urinating. Musculoskeletal:   Negative for back pain. No joint swelling or pain. Skin:   Negative for rash. Neurological:   Negative for headaches, focal weakness or numbness. Psychiatric:  No anxiety or depression.   Endocrine:  No hot/cold intolerance, changes in energy, or sleep difficulty.  10-point ROS otherwise negative.  ____________________________________________   PHYSICAL EXAM:  VITAL SIGNS: ED Triage Vitals  Enc Vitals Group     BP 12/15/14 1232 120/68 mmHg     Pulse Rate 12/15/14 1232 97     Resp 12/15/14 1232 18     Temp 12/15/14 1232 98.1 F (36.7 C)     Temp Source 12/15/14 1232 Oral     SpO2 12/15/14 1232 93 %     Weight 12/15/14 1232 350 lb (158.759 kg)     Height 12/15/14 1232 5\' 5"  (1.651 m)     Head Cir --      Peak Flow --      Pain  Score 12/15/14 1234 8     Pain Loc --      Pain Edu? --      Excl. in [place]? --      Constitutional:   Alert and oriented.  Well appearing and in no distress. Eyes:   No scleral icterus. No conjunctival pallor. PERRL. EOMI ENT   Head:   Normocephalic and atraumatic.   Nose:   No congestion/rhinnorhea. No septal hematoma   Mouth/Throat:   MMM, no pharyngeal erythema. No peritonsillar mass. No uvula shift.   Neck:   No stridor. No SubQ emphysema. No meningismus. Hematological/Lymphatic/Immunilogical:   No cervical lymphadenopathy. Cardiovascular:   RRR. Normal and symmetric distal pulses are present in all extremities. No murmurs, rubs, or gallops. Respiratory:   Normal respiratory effort without tachypnea nor retractions. There is diffuse expiratory wheezing with mild prolongation of expiratory phase. Gastrointestinal:   Soft with mild epigastric tenderness. No pulsatile mass or bruit.. No distention. There is no CVA tenderness.  No rebound, rigidity, or guarding. Genitourinary:   deferred Musculoskeletal:   Nontender with normal range of motion in all extremities. No joint effusions.  No lower extremity tenderness.  No edema. Neurologic:   Normal speech and language.  CN 2-10 normal. Motor grossly intact. No pronator drift.  Normal gait. No gross focal neurologic deficits are appreciated.  Skin:    Skin is warm, dry and intact. No rash noted.  No petechiae, purpura, or bullae. Psychiatric:   Mood and affect are normal. Speech and behavior are normal. Patient exhibits appropriate insight and judgment.  ____________________________________________    LABS (pertinent positives/negatives) (all labs ordered are listed, but only abnormal results are displayed) Labs Reviewed  COMPREHENSIVE METABOLIC PANEL - Abnormal; Notable for the following:    CO2 33 (*)    Glucose, Bld 113 (*)    All other components within normal limits  LIPASE, BLOOD - Abnormal; Notable for the following:    Lipase 17 (*)    All other components within normal limits  CBC WITH DIFFERENTIAL/PLATELET - Abnormal; Notable for the  following:    RBC 3.63 (*)    Hemoglobin 10.7 (*)    HCT 33.4 (*)    MCHC 31.9 (*)    RDW 15.9 (*)    All other components within normal limits   ____________________________________________   EKG  Interpreted by me  Date: 12/15/2014  Rate: 94  Rhythm: normal sinus rhythm  QRS Axis: normal  Intervals: normal  ST/T Wave abnormalities: normal  Conduction Disutrbances: none  Narrative Interpretation: unremarkable      ____________________________________________    RADIOLOGY  Chest x-ray unremarkable  ____________________________________________   PROCEDURES   ____________________________________________   INITIAL IMPRESSION / ASSESSMENT AND PLAN / ED COURSE  Pertinent labs & imaging results that were available during my care of the patient were reviewed by me and considered in my medical decision making (see chart for details).  Patient presents with chest pain shortness of breath consistent with COPD exacerbation. DuoNeb steroids and chest x-ray.  ----------------------------------------- 3:00 PM on 12/15/2014 -----------------------------------------  Workup unremarkable.patient feeling better. We'll start on doxycycline  And  Continue short course of steroids for COPD exacerbation. We'll discharge the patient home to [facility] have her follow up with primary care with within 2 days. Low suspicion for ACS PE TAD pneumothorax carditis mediastinitis pneumonia sepsis AAA.     ____________________________________________   FINAL CLINICAL IMPRESSION(S) / ED DIAGNOSES  Final diagnoses:  COPD with acute exacerbation ([place])  Carrie Mew, MD 12/15/14 1501

## 2014-12-25 ENCOUNTER — Ambulatory Visit: Payer: Medicare PPO | Admitting: Family Medicine

## 2015-01-08 ENCOUNTER — Other Ambulatory Visit: Payer: Self-pay | Admitting: *Deleted

## 2015-01-08 NOTE — Patient Outreach (Signed)
Called pt's home after speaking with Amy at  [facility]) and finding pt was not longer there.   View in [place] 10/11 ED note- pt discharged back to [facility].    Pt reports been  home from [facility] 2-3 weeks ago, states come home with [facility] RN, [facility] PT, both seeing her  1-2 times a week.  Pt states also has an aide.  Pt reports breathing okay, weighs sometimes.   RN CM discussed scheduling a home visit to which pt states can't deal with everyone coming in.   RN CM discussed with pt it is her choice to refuse or accept ongoing [facility] services to which pt states she wants to think about it, will call back.    As discussed, if do not hear back from pt, RN CM to do f/u call.    Zara Chess.   [place] [facility]  [phone]

## 2015-01-13 NOTE — Progress Notes (Signed)
This encounter was created in error - please disregard.

## 2015-01-15 ENCOUNTER — Other Ambulatory Visit: Payer: Self-pay | Admitting: *Deleted

## 2015-01-15 NOTE — Patient Outreach (Signed)
Attempt made to f/u with pt, to see if she wants to continue to receive [facility] RN CM services.   HIPPA compliant voice message left with contact number.  If no response, will try again.     Zara Chess.   [place] [facility]  [phone]

## 2015-01-19 ENCOUNTER — Other Ambulatory Visit: Payer: Self-pay | Admitting: *Deleted

## 2015-01-19 NOTE — Patient Outreach (Signed)
Second attempt to f/u with pt to see if want to continue to receive [facility] RN CM services.   HIPPA compliant voice message left with contact number.  If no response, will try one more time.     Zara Chess.   [place] [facility]  [phone]

## 2015-01-21 ENCOUNTER — Other Ambulatory Visit: Payer: Self-pay | Admitting: *Deleted

## 2015-01-21 NOTE — Patient Outreach (Signed)
Third attempt made to contact pt, if no response, will send unable to contact letter.  If no response to unable to contact letter, will close case.   Loretta Wade.   [place] [facility]  [phone]

## 2015-01-22 ENCOUNTER — Encounter: Payer: Self-pay | Admitting: *Deleted

## 2015-01-26 ENCOUNTER — Other Ambulatory Visit: Payer: Self-pay

## 2015-01-26 DIAGNOSIS — G2581 Restless legs syndrome: Secondary | ICD-10-CM

## 2015-01-26 MED ORDER — ROPINIROLE HCL 0.25 MG PO TABS: 0.7500 mg | ORAL_TABLET | Freq: Every day | ORAL | Status: DC

## 2015-01-26 NOTE — Telephone Encounter (Signed)
Routed to Dr. Shah for approval 

## 2015-01-27 ENCOUNTER — Other Ambulatory Visit: Payer: Self-pay | Admitting: Family Medicine

## 2015-01-28 ENCOUNTER — Emergency Department

## 2015-01-28 ENCOUNTER — Encounter: Payer: Self-pay | Admitting: *Deleted

## 2015-01-28 ENCOUNTER — Inpatient Hospital Stay
Admission: EM | Admit: 2015-01-28 | Discharge: 2015-02-08 | DRG: 190 | Disposition: A | Discharge status: Home / Self Care | Attending: Internal Medicine | Admitting: Internal Medicine

## 2015-01-28 DIAGNOSIS — M797 Fibromyalgia: Secondary | ICD-10-CM | POA: Diagnosis present

## 2015-01-28 DIAGNOSIS — B9629 Other Escherichia coli [E. coli] as the cause of diseases classified elsewhere: Secondary | ICD-10-CM | POA: Diagnosis present

## 2015-01-28 DIAGNOSIS — Z9049 Acquired absence of other specified parts of digestive tract: Secondary | ICD-10-CM | POA: Diagnosis not present

## 2015-01-28 DIAGNOSIS — R0602 Shortness of breath: Secondary | ICD-10-CM

## 2015-01-28 DIAGNOSIS — Z792 Long term (current) use of antibiotics: Secondary | ICD-10-CM | POA: Diagnosis not present

## 2015-01-28 DIAGNOSIS — N3000 Acute cystitis without hematuria: Secondary | ICD-10-CM | POA: Diagnosis present

## 2015-01-28 DIAGNOSIS — J449 Chronic obstructive pulmonary disease, unspecified: Secondary | ICD-10-CM | POA: Diagnosis present

## 2015-01-28 DIAGNOSIS — M79669 Pain in unspecified lower leg: Secondary | ICD-10-CM

## 2015-01-28 DIAGNOSIS — R600 Localized edema: Secondary | ICD-10-CM | POA: Diagnosis present

## 2015-01-28 DIAGNOSIS — Z87891 Personal history of nicotine dependence: Secondary | ICD-10-CM | POA: Diagnosis not present

## 2015-01-28 DIAGNOSIS — G8929 Other chronic pain: Secondary | ICD-10-CM | POA: Diagnosis present

## 2015-01-28 DIAGNOSIS — J441 Chronic obstructive pulmonary disease with (acute) exacerbation: Secondary | ICD-10-CM | POA: Diagnosis present

## 2015-01-28 DIAGNOSIS — M1990 Unspecified osteoarthritis, unspecified site: Secondary | ICD-10-CM | POA: Diagnosis present

## 2015-01-28 DIAGNOSIS — I5023 Acute on chronic systolic (congestive) heart failure: Secondary | ICD-10-CM | POA: Diagnosis present

## 2015-01-28 DIAGNOSIS — J9610 Chronic respiratory failure, unspecified whether with hypoxia or hypercapnia: Secondary | ICD-10-CM | POA: Diagnosis present

## 2015-01-28 DIAGNOSIS — Z7952 Long term (current) use of systemic steroids: Secondary | ICD-10-CM

## 2015-01-28 DIAGNOSIS — Z803 Family history of malignant neoplasm of breast: Secondary | ICD-10-CM | POA: Diagnosis not present

## 2015-01-28 DIAGNOSIS — Z833 Family history of diabetes mellitus: Secondary | ICD-10-CM | POA: Diagnosis not present

## 2015-01-28 DIAGNOSIS — I110 Hypertensive heart disease with heart failure: Secondary | ICD-10-CM | POA: Diagnosis present

## 2015-01-28 DIAGNOSIS — Z8249 Family history of ischemic heart disease and other diseases of the circulatory system: Secondary | ICD-10-CM

## 2015-01-28 DIAGNOSIS — R040 Epistaxis: Secondary | ICD-10-CM | POA: Diagnosis present

## 2015-01-28 DIAGNOSIS — K921 Melena: Secondary | ICD-10-CM | POA: Diagnosis present

## 2015-01-28 DIAGNOSIS — Z6841 Body Mass Index (BMI) 40.0 and over, adult: Secondary | ICD-10-CM

## 2015-01-28 DIAGNOSIS — K219 Gastro-esophageal reflux disease without esophagitis: Secondary | ICD-10-CM | POA: Diagnosis present

## 2015-01-28 DIAGNOSIS — E663 Overweight: Secondary | ICD-10-CM | POA: Diagnosis present

## 2015-01-28 DIAGNOSIS — Z9981 Dependence on supplemental oxygen: Secondary | ICD-10-CM | POA: Diagnosis not present

## 2015-01-28 DIAGNOSIS — I272 Other secondary pulmonary hypertension: Secondary | ICD-10-CM | POA: Diagnosis present

## 2015-01-28 DIAGNOSIS — G4733 Obstructive sleep apnea (adult) (pediatric): Secondary | ICD-10-CM | POA: Diagnosis present

## 2015-01-28 DIAGNOSIS — N3090 Cystitis, unspecified without hematuria: Secondary | ICD-10-CM | POA: Diagnosis present

## 2015-01-28 LAB — URINALYSIS COMPLETE WITH MICROSCOPIC (ARMC ONLY)
BACTERIA UA: NONE SEEN
BILIRUBIN URINE: NEGATIVE
Glucose, UA: NEGATIVE mg/dL
Hgb urine dipstick: NEGATIVE
LEUKOCYTES UA: NEGATIVE
NITRITE: NEGATIVE
Protein, ur: NEGATIVE mg/dL
RBC / HPF: NONE SEEN RBC/hpf (ref 0–5)
SPECIFIC GRAVITY, URINE: 1.015 (ref 1.005–1.030)
WBC, UA: NONE SEEN WBC/hpf (ref 0–5)
pH: 8 (ref 5.0–8.0)

## 2015-01-28 LAB — CBC WITH DIFFERENTIAL/PLATELET
Basophils Absolute: 0 10*3/uL (ref 0–0.1)
Basophils Relative: 1 %
EOS ABS: 0.1 10*3/uL (ref 0–0.7)
Eosinophils Relative: 3 %
HCT: 35.1 % (ref 35.0–47.0)
HEMOGLOBIN: 10.8 g/dL — AB (ref 12.0–16.0)
LYMPHS ABS: 1.4 10*3/uL (ref 1.0–3.6)
Lymphocytes Relative: 30 %
MCH: 26.9 pg (ref 26.0–34.0)
MCHC: 30.7 g/dL — AB (ref 32.0–36.0)
MCV: 87.9 fL (ref 80.0–100.0)
MONO ABS: 0.8 10*3/uL (ref 0.2–0.9)
MONOS PCT: 16 %
NEUTROS PCT: 50 %
Neutro Abs: 2.3 10*3/uL (ref 1.4–6.5)
Platelets: 248 10*3/uL (ref 150–440)
RBC: 3.99 MIL/uL (ref 3.80–5.20)
RDW: 17.4 % — AB (ref 11.5–14.5)
WBC: 4.7 10*3/uL (ref 3.6–11.0)

## 2015-01-28 LAB — COMPREHENSIVE METABOLIC PANEL
ALK PHOS: 77 U/L (ref 38–126)
ALT: 13 U/L — ABNORMAL LOW (ref 14–54)
ANION GAP: 5 (ref 5–15)
AST: 17 U/L (ref 15–41)
Albumin: 3.5 g/dL (ref 3.5–5.0)
BILIRUBIN TOTAL: 0.5 mg/dL (ref 0.3–1.2)
BUN: 6 mg/dL (ref 6–20)
CALCIUM: 9.1 mg/dL (ref 8.9–10.3)
CO2: 34 mmol/L — AB (ref 22–32)
Chloride: 103 mmol/L (ref 101–111)
Creatinine, Ser: 0.72 mg/dL (ref 0.44–1.00)
GFR calc non Af Amer: >60 mL/min (ref >60)
Glucose, Bld: 119 mg/dL — ABNORMAL HIGH (ref 65–99)
Potassium: 3.7 mmol/L (ref 3.5–5.1)
SODIUM: 142 mmol/L (ref 135–145)
TOTAL PROTEIN: 6.6 g/dL (ref 6.5–8.1)

## 2015-01-28 LAB — BRAIN NATRIURETIC PEPTIDE: B Natriuretic Peptide: 35 pg/mL (ref 0.0–100.0)

## 2015-01-28 LAB — TROPONIN I: Troponin I: <0.03 ng/mL (ref <0.031)

## 2015-01-28 LAB — MRSA PCR SCREENING: MRSA BY PCR: NEGATIVE

## 2015-01-28 MED ORDER — DOCUSATE SODIUM 100 MG PO CAPS
100.0000 mg | ORAL_CAPSULE | Freq: Two times a day (BID) | ORAL | Status: DC
  Administered 2015-01-28 – 2015-02-08 (×15): 100 mg via ORAL
  Filled 2015-01-28 (×20): qty 1

## 2015-01-28 MED ORDER — SODIUM CHLORIDE 0.9 % IJ SOLN
3.0000 mL | Freq: Two times a day (BID) | INTRAMUSCULAR | Status: DC
  Administered 2015-01-28 – 2015-02-08 (×21): 3 mL via INTRAVENOUS

## 2015-01-28 MED ORDER — ENOXAPARIN SODIUM 40 MG/0.4ML ~~LOC~~ SOLN
40.0000 mg | Freq: Two times a day (BID) | SUBCUTANEOUS | Status: DC
  Administered 2015-01-28 – 2015-01-31 (×7): 40 mg via SUBCUTANEOUS
  Filled 2015-01-28 (×7): qty 0.4

## 2015-01-28 MED ORDER — ACETAMINOPHEN 325 MG PO TABS
650.0000 mg | ORAL_TABLET | Freq: Four times a day (QID) | ORAL | Status: DC | PRN
  Administered 2015-01-30 – 2015-02-01 (×2): 650 mg via ORAL
  Filled 2015-01-28 (×2): qty 2

## 2015-01-28 MED ORDER — OXYBUTYNIN CHLORIDE 5 MG PO TABS
5.0000 mg | ORAL_TABLET | Freq: Every day | ORAL | Status: DC
  Administered 2015-01-28 – 2015-02-08 (×12): 5 mg via ORAL
  Filled 2015-01-28 (×12): qty 1

## 2015-01-28 MED ORDER — FUROSEMIDE 40 MG PO TABS
40.0000 mg | ORAL_TABLET | Freq: Every day | ORAL | Status: DC
  Administered 2015-01-28 – 2015-02-04 (×8): 40 mg via ORAL
  Filled 2015-01-28 (×8): qty 1

## 2015-01-28 MED ORDER — BUDESONIDE 0.25 MG/2ML IN SUSP
0.2500 mg | Freq: Two times a day (BID) | RESPIRATORY_TRACT | Status: DC
  Administered 2015-01-28 – 2015-02-08 (×23): 0.25 mg via RESPIRATORY_TRACT
  Filled 2015-01-28 (×23): qty 2

## 2015-01-28 MED ORDER — SENNOSIDES-DOCUSATE SODIUM 8.6-50 MG PO TABS
2.0000 | ORAL_TABLET | Freq: Every day | ORAL | Status: DC
  Administered 2015-01-28 – 2015-02-05 (×3): 2 via ORAL
  Filled 2015-01-28 (×13): qty 2

## 2015-01-28 MED ORDER — HYDROCOD POLST-CPM POLST ER 10-8 MG/5ML PO SUER
5.0000 mL | Freq: Once | ORAL | Status: AC
  Administered 2015-01-28: 5 mL via ORAL
  Filled 2015-01-28: qty 5

## 2015-01-28 MED ORDER — METHYLPREDNISOLONE SODIUM SUCC 125 MG IJ SOLR
125.0000 mg | Freq: Once | INTRAMUSCULAR | Status: AC
  Administered 2015-01-28: 125 mg via INTRAVENOUS
  Filled 2015-01-28: qty 2

## 2015-01-28 MED ORDER — HYDROCODONE-ACETAMINOPHEN 5-325 MG PO TABS
1.0000 | ORAL_TABLET | ORAL | Status: DC | PRN
  Administered 2015-01-29 – 2015-01-31 (×7): 1 via ORAL
  Administered 2015-02-01: 2 via ORAL
  Administered 2015-02-01 (×2): 1 via ORAL
  Administered 2015-02-01 – 2015-02-03 (×5): 2 via ORAL
  Administered 2015-02-03 (×3): 1 via ORAL
  Administered 2015-02-03: 2 via ORAL
  Administered 2015-02-04 (×2): 1 via ORAL
  Administered 2015-02-05 (×3): 2 via ORAL
  Administered 2015-02-06 – 2015-02-08 (×8): 1 via ORAL
  Filled 2015-01-28 (×2): qty 1
  Filled 2015-01-28 (×2): qty 2
  Filled 2015-01-28 (×3): qty 1
  Filled 2015-01-28 (×2): qty 2
  Filled 2015-01-28 (×3): qty 1
  Filled 2015-01-28 (×4): qty 2
  Filled 2015-01-28 (×2): qty 1
  Filled 2015-01-28: qty 2
  Filled 2015-01-28 (×2): qty 1
  Filled 2015-01-28: qty 2
  Filled 2015-01-28 (×2): qty 1
  Filled 2015-01-28: qty 2
  Filled 2015-01-28 (×6): qty 1
  Filled 2015-01-28: qty 2
  Filled 2015-01-28: qty 1

## 2015-01-28 MED ORDER — LEVOFLOXACIN 500 MG PO TABS
500.0000 mg | ORAL_TABLET | Freq: Every day | ORAL | Status: DC
  Administered 2015-01-28 – 2015-01-30 (×3): 500 mg via ORAL
  Filled 2015-01-28 (×3): qty 1

## 2015-01-28 MED ORDER — GUAIFENESIN-DM 100-10 MG/5ML PO SYRP
10.0000 mL | ORAL_SOLUTION | ORAL | Status: DC | PRN
  Administered 2015-02-02: 10 mL via ORAL
  Filled 2015-01-28: qty 10

## 2015-01-28 MED ORDER — METHYLPREDNISOLONE SODIUM SUCC 125 MG IJ SOLR
60.0000 mg | Freq: Four times a day (QID) | INTRAMUSCULAR | Status: DC
  Administered 2015-01-28 – 2015-01-31 (×13): 60 mg via INTRAVENOUS
  Filled 2015-01-28 (×15): qty 2

## 2015-01-28 MED ORDER — VITAMIN D 1000 UNITS PO TABS
1000.0000 [IU] | ORAL_TABLET | Freq: Every day | ORAL | Status: DC
  Administered 2015-01-28 – 2015-02-08 (×12): 1000 [IU] via ORAL
  Filled 2015-01-28 (×12): qty 1

## 2015-01-28 MED ORDER — ALBUTEROL SULFATE (2.5 MG/3ML) 0.083% IN NEBU
2.5000 mg | INHALATION_SOLUTION | Freq: Three times a day (TID) | RESPIRATORY_TRACT | Status: DC
Start: 2015-01-29 — End: 2015-02-08
  Administered 2015-01-29 – 2015-02-08 (×31): 2.5 mg via RESPIRATORY_TRACT
  Filled 2015-01-28 (×31): qty 3

## 2015-01-28 MED ORDER — MORPHINE SULFATE ER 15 MG PO TBCR
15.0000 mg | EXTENDED_RELEASE_TABLET | Freq: Two times a day (BID) | ORAL | Status: DC
  Administered 2015-01-28 – 2015-02-08 (×23): 15 mg via ORAL
  Filled 2015-01-28 (×24): qty 1

## 2015-01-28 MED ORDER — MORPHINE SULFATE ER 30 MG PO TBCR
30.0000 mg | EXTENDED_RELEASE_TABLET | Freq: Two times a day (BID) | ORAL | Status: DC
  Administered 2015-01-28 – 2015-02-08 (×23): 30 mg via ORAL
  Filled 2015-01-28 (×24): qty 1

## 2015-01-28 MED ORDER — MAGNESIUM OXIDE 400 (241.3 MG) MG PO TABS
400.0000 mg | ORAL_TABLET | Freq: Every day | ORAL | Status: DC
  Administered 2015-01-28 – 2015-02-08 (×12): 400 mg via ORAL
  Filled 2015-01-28 (×12): qty 1

## 2015-01-28 MED ORDER — IPRATROPIUM-ALBUTEROL 0.5-2.5 (3) MG/3ML IN SOLN
3.0000 mL | Freq: Once | RESPIRATORY_TRACT | Status: AC
  Administered 2015-01-28: 3 mL via RESPIRATORY_TRACT
  Filled 2015-01-28: qty 3

## 2015-01-28 MED ORDER — ALBUTEROL SULFATE (2.5 MG/3ML) 0.083% IN NEBU
2.5000 mg | INHALATION_SOLUTION | Freq: Four times a day (QID) | RESPIRATORY_TRACT | Status: DC
  Administered 2015-01-28 (×2): 2.5 mg via RESPIRATORY_TRACT
  Filled 2015-01-28 (×2): qty 3

## 2015-01-28 MED ORDER — PREGABALIN 75 MG PO CAPS
150.0000 mg | ORAL_CAPSULE | Freq: Two times a day (BID) | ORAL | Status: DC
  Administered 2015-01-28 – 2015-02-08 (×23): 150 mg via ORAL
  Filled 2015-01-28 (×23): qty 2

## 2015-01-28 MED ORDER — ROPINIROLE HCL 0.25 MG PO TABS
0.7500 mg | ORAL_TABLET | Freq: Every day | ORAL | Status: DC
  Administered 2015-01-28 – 2015-02-07 (×11): 0.75 mg via ORAL
  Filled 2015-01-28 (×12): qty 3

## 2015-01-28 MED ORDER — PANTOPRAZOLE SODIUM 40 MG PO TBEC
40.0000 mg | DELAYED_RELEASE_TABLET | Freq: Two times a day (BID) | ORAL | Status: DC
  Administered 2015-01-28 – 2015-02-08 (×23): 40 mg via ORAL
  Filled 2015-01-28 (×23): qty 1

## 2015-01-28 MED ORDER — ACETAMINOPHEN 650 MG RE SUPP: 650.0000 mg | Freq: Four times a day (QID) | RECTAL | Status: DC | PRN

## 2015-01-28 NOTE — H&P (Signed)
[name] [facility] [place] NAME: Loretta Wade    MR#:  [id number]  DATE OF BIRTH:  1951-01-29  DATE OF ADMISSION:  01/28/2015  PRIMARY CARE PHYSICIAN: Keith Rake, MD   REQUESTING/REFERRING PHYSICIAN: Mena Goes  CHIEF COMPLAINT:   Chief Complaint  Patient presents with  . Shortness of Breath    HISTORY OF PRESENT ILLNESS:  Loretta Wade  is a 64 y.o. female with a known history of COPD. She presents with shortness of breath going on for a couple weeks getting worse and worse. Her home health nurse coming in and had some prescriptions called in and looks like prednisone and doxycycline. Last night she can hardly breathe. She complains of chest pain 8 out of 10 intensity which is constant burning type of pain and she does have a history of gastroesophageal reflux disease. She's been coughing up clear phlegm. At home had a fever and some chills. In the ER, she was found to be in COPD exacerbation and hospitalist services were contacted for further evaluation  PAST MEDICAL HISTORY:   Past Medical History  Diagnosis Date  . OA (osteoarthritis)   . Fibromyalgia   . Anemia   . Sleep apnea   . Fatigue   . Edema   . Esophageal reflux   . Nocturia   . Hypertension   . COPD (chronic obstructive pulmonary disease) ([place])     PAST SURGICAL HISTORY:   Past Surgical History  Procedure Laterality Date  . Cholecystectomy    . Total knee arthroplasty      right  . Vesicovaginal fistula closure w/ tah    . Abdominal hysterectomy      SOCIAL HISTORY:   Social History  Substance Use Topics  . Smoking status: Former Smoker    Quit date: 11/04/1983  . Smokeless tobacco: Not on file  . Alcohol Use: No    FAMILY HISTORY:   Family History  Problem Relation Age of Onset  . Hypertension Mother   . Diabetes    . Breast cancer Daughter     DRUG ALLERGIES:  No Known Allergies  REVIEW OF SYSTEMS:  CONSTITUTIONAL: Positive  fever and chills, positive for fatigue. Weight up and down EYES: No blurred or double vision. Wears glasses. EARS, NOSE, AND THROAT: No tinnitus. Positive for earache right greater than left. No sore throat. RESPIRATORY: Positive for cough and shortness of breath, positive for wheezing. No hemoptysis.  CARDIOVASCULAR: Positive for chest pain, positive for edema.  GASTROINTESTINAL: Positive for nausea, and abdominal pain.  History of constipation and occasional blood in the bowel movements with straining GENITOURINARY: Positive for burning on urination  ENDOCRINE: No polyuria, nocturia,  HEMATOLOGY: No anemia, easy bruising or bleeding SKIN: Rash on the breast MUSCULOSKELETAL: No joint pain or arthritis.   NEUROLOGIC: No tingling, numbness, weakness.  PSYCHIATRY: No anxiety or depression.   MEDICATIONS AT HOME:   Prior to Admission medications   Medication Sig Start Date End Date Taking? Authorizing Provider  acetaminophen (TYLENOL) 325 MG tablet Take 2 tablets (650 mg total) by mouth every 6 (six) hours as needed for mild pain (or Fever >/= 101). 11/17/14  Yes Gladstone Lighter, MD  albuterol (PROVENTIL) (2.5 MG/3ML) 0.083% nebulizer solution Take 3 mLs (2.5 mg total) by nebulization every 6 (six) hours as needed for wheezing or shortness of breath. 11/19/14  Yes Gladstone Lighter, MD  azithromycin (ZITHROMAX) 500 MG tablet Take by mouth daily.   Yes Historical Provider, MD  Chlorpheniramine-Acetaminophen (CORICIDIN HBP COLD/FLU PO) Take 2 tablets by mouth daily as needed (for nasal congestion/cough).    Yes Historical Provider, MD  cholecalciferol (VITAMIN D) 1000 UNITS tablet Take 1,000 Units by mouth daily.    Yes Historical Provider, MD  dexlansoprazole (DEXILANT) 60 MG capsule Take 60 mg by mouth daily.   Yes Historical Provider, MD  docusate sodium (COLACE) 50 MG capsule Take 100 mg by mouth daily.   Yes Historical Provider, MD  Fluticasone Furoate-Vilanterol 100-25 MCG/INH AEPB Inhale 1  puff into the lungs 2 (two) times daily.    Yes Historical Provider, MD  furosemide (LASIX) 20 MG tablet Take 1 tablet (20 mg total) by mouth every other day. 11/17/14  Yes Gladstone Lighter, MD  guaiFENesin-dextromethorphan (ROBITUSSIN DM) 100-10 MG/5ML syrup Take 10 mLs by mouth every 4 (four) hours as needed for cough.   Yes Historical Provider, MD  HYDROcodone-acetaminophen (NORCO/VICODIN) 5-325 MG per tablet Take 1-2 tablets by mouth every 4 (four) hours as needed for moderate pain. Patient taking differently: Take 1-2 tablets by mouth every 8 (eight) hours as needed for moderate pain.  11/17/14  Yes Gladstone Lighter, MD  Magnesium Oxide 250 MG TABS Take 250 mg by mouth daily.   Yes Historical Provider, MD  methocarbamol (ROBAXIN) 500 MG tablet Take 500 mg by mouth every 6 (six) hours as needed for muscle spasms.   Yes Historical Provider, MD  morphine (MS CONTIN) 15 MG 12 hr tablet Take 15 mg by mouth every 12 (twelve) hours.   Yes Historical Provider, MD  morphine (MS CONTIN) 30 MG 12 hr tablet Take 30 mg by mouth every 12 (twelve) hours.   Yes Historical Provider, MD  morphine 10 MG/5ML solution Take 5 mg by mouth every 2 (two) hours as needed for severe pain (For Dyspnea).   Yes Historical Provider, MD  oxybutynin (DITROPAN) 5 MG tablet Take 1 tablet (5 mg total) by mouth daily. 11/17/14  Yes Gladstone Lighter, MD  pantoprazole (PROTONIX) 40 MG tablet Take 1 tablet (40 mg total) by mouth 2 (two) times daily. 11/08/14  Yes Vaughan Basta, MD  pregabalin (LYRICA) 150 MG capsule Take 150 mg by mouth 2 (two) times daily.   Yes Historical Provider, MD  propranolol (INDERAL) 80 MG tablet Take 80 mg by mouth daily.   Yes Historical Provider, MD  ranitidine (ZANTAC) 150 MG tablet Take 150 mg by mouth 3 (three) times daily as needed for heartburn.   Yes Historical Provider, MD  rOPINIRole (REQUIP) 0.25 MG tablet Take 3 tablets (0.75 mg total) by mouth at bedtime. 01/26/15  Yes Roselee Nova, MD   senna-docusate (SENOKOT-S) 8.6-50 MG per tablet Take 2 tablets by mouth daily. 11/19/14  Yes Gladstone Lighter, MD  doxycycline (VIBRAMYCIN) 100 MG capsule Take 1 capsule (100 mg total) by mouth 2 (two) times daily. Patient not taking: Reported on 01/28/2015 12/15/14   Carrie Mew, MD  ipratropium-albuterol (DUONEB) 0.5-2.5 (3) MG/3ML SOLN Take 3 mLs by nebulization every 6 (six) hours. Patient not taking: Reported on 01/28/2015 11/17/14   Gladstone Lighter, MD  predniSONE (DELTASONE) 20 MG tablet Take 2 tablets (40 mg total) by mouth daily. Patient not taking: Reported on 01/28/2015 12/15/14   Carrie Mew, MD      VITAL SIGNS:  Blood pressure 130/58, pulse 88, temperature 99 F (37.2 C), temperature source Oral, resp. rate 24, height 5\' 5"  (1.651 m), weight 163.295 kg (360 lb), SpO2 95 %.  PHYSICAL EXAMINATION:  GENERAL:  64 y.o.-year-old  patient lying in the bed with no acute distress.  EYES: Pupils equal, round, reactive to light and accommodation. No scleral icterus. Extraocular muscles intact.  HEENT: Head atraumatic, normocephalic. Oropharynx and nasopharynx clear.  NECK:  Supple, no jugular venous distention. No thyroid enlargement, no tenderness.  LUNGS: decreased  breath sounds bilaterally,  positivewheezing,  norales,rhonchi or crepitation. No use of accessory muscles of respiration.  CARDIOVASCULAR: S1, S2 normal. . A 6 systolicmurmur,  No rubs, or gallops.  ABDOMEN: Soft, slight tenderness lower abdomen. nondistended. Bowel sounds present. No organomegaly or mass.  EXTREMITIES:  2+edema, cyanosis, or clubbing.  NEUROLOGIC: Cranial nerves II through XII are intact. Muscle strength 5/5 in all extremities. Sensation intact. Gait not checked.  PSYCHIATRIC: The patient is alert and oriented x 3.  SKIN:  Chronic lower extremity discoloration  LABORATORY PANEL:   CBC  Recent Labs Lab 01/28/15 0414  WBC 4.7  HGB 10.8*  HCT 35.1  PLT 248    ------------------------------------------------------------------------------------------------------------------  Chemistries   Recent Labs Lab 01/28/15 0430  NA 142  K 3.7  CL 103  CO2 34*  GLUCOSE 119*  BUN 6  CREATININE 0.72  CALCIUM 9.1  AST 17  ALT 13*  ALKPHOS 77  BILITOT 0.5   ------------------------------------------------------------------------------------------------------------------  Cardiac Enzymes  Recent Labs Lab 01/28/15 0430  TROPONINI <0.03   ------------------------------------------------------------------------------------------------------------------  RADIOLOGY:  Dg Chest Port 1 View  01/28/2015  CLINICAL DATA:  Acute onset of cough and shortness of breath. Generalized chest pain. Initial encounter. EXAM: PORTABLE CHEST 1 VIEW COMPARISON:  Chest radiograph performed 12/15/2014 FINDINGS: The lungs are well-aerated. Pulmonary vascularity is at the upper limits of normal. Minimal left basilar atelectasis is noted. There is no evidence of pleural effusion or pneumothorax. The cardiomediastinal silhouette is mildly enlarged. No acute osseous abnormalities are seen. IMPRESSION: Mild cardiomegaly.  Minimal left basilar atelectasis noted. Electronically Signed   By: Garald Balding M.D.   On: 01/28/2015 05:11    EKG:   Sinus rhythm 64 bpm no acute ST-T wave changes   IMPRESSION AND PLAN:   1. COPD exacerbation, chronic respiratory failure on 2 L of oxygen. Start Solu-Medrol 60 mg every 6 hours. By mouth Levaquin. Nebulizer treatments and budesonide treatments. 2. Gastroesophageal reflux disease without esophagitis continue PPI 3. Essential hypertension- continue current medications 4. Fibromyalgia and chronic pain- asked pharmacy to check on her chronic pain medication doses and order them. 5. Lower extremity edema increase Lasix to 40 mg orally daily.   All the records are reviewed and case discussed with ED provider. Management plans  discussed with the patient, family and they are in agreement.  CODE STATUS: Full Code  TOTAL TIME TAKING CARE OF THIS PATIENT: 55 minutes.    Loletha Grayer M.D on 01/28/2015 at 11:25 AM  Between 7am to 6pm - Pager - [phone]  After 6pm call admission pager [facility]  Office  [phone]  CC: Primary care physician; Keith Rake, MD

## 2015-01-28 NOTE — ED Provider Notes (Signed)
[facility] Emergency Department Provider Note  ____________________________________________  Time seen: Approximately 4:17 AM  I have reviewed the triage vital signs and the nursing notes.   HISTORY  Chief Complaint Shortness of Breath    HPI Loretta Wade is a 64 y.o. female who presents to the ED from home via EMS with a chief complaint of cough, shortness of breath and chest pain. Patient has a history of COPD on 2 L oxygen continuously who has had a one-week history of nonproductive cough associated with chills. States she felt more short of breath this morning associated with chest tightness. Denies recent travel or trauma. Denies associated symptoms of fever, abdominal pain, nausea, vomiting, diarrhea. Nothing makes her symptoms worse. Nebulizer treatments make her symptoms slightly better.   Past Medical History  Diagnosis Date  . OA (osteoarthritis)   . Fibromyalgia   . Anemia   . Sleep apnea   . Fatigue   . Edema   . Esophageal reflux   . Nocturia   . Hypertension   . COPD (chronic obstructive pulmonary disease) [facility])     Patient Active Problem List   Diagnosis Date Noted  . MRSA infection 11/17/2014  . Acute cystitis 11/17/2014  . ARF (acute renal failure) ([place]) 11/13/2014  . Diastolic CHF, acute on chronic (HCC) 11/12/2014  . Morbid obesity with BMI of 50.0-59.9, adult ([place]) 11/12/2014  . Neck pain, chronic 11/12/2014  . COPD exacerbation ([place]) 11/04/2014  . Urinary tract infection 10/02/2014    Past Surgical History  Procedure Laterality Date  . Cholecystectomy    . Total knee arthroplasty      right  . Vesicovaginal fistula closure w/ tah    . Abdominal hysterectomy      Current Outpatient Rx  Name  Route  Sig  Dispense  Refill  . acetaminophen (TYLENOL) 325 MG tablet   Oral   Take 2 tablets (650 mg total) by mouth every 6 (six) hours as needed for mild pain (or Fever >/= 101).   30 tablet   0   . albuterol  (PROVENTIL) (2.5 MG/3ML) 0.083% nebulizer solution   Nebulization   Take 3 mLs (2.5 mg total) by nebulization every 6 (six) hours as needed for wheezing or shortness of breath.   75 mL   12   . Chlorpheniramine-Acetaminophen (CORICIDIN HBP COLD/FLU PO)   Oral   Take 2 tablets by mouth daily as needed (for nasal congestion/cough).          . cholecalciferol (VITAMIN D) 1000 UNITS tablet   Oral   Take 1,000 Units by mouth daily.          Marland Kitchen dexlansoprazole (DEXILANT) 60 MG capsule   Oral   Take 60 mg by mouth daily.         Marland Kitchen doxycycline (VIBRAMYCIN) 100 MG capsule   Oral   Take 1 capsule (100 mg total) by mouth 2 (two) times daily.   28 capsule   0   . Fluticasone Furoate-Vilanterol 100-25 MCG/INH AEPB   Inhalation   Inhale 1 puff into the lungs 2 (two) times daily.          . furosemide (LASIX) 20 MG tablet   Oral   Take 1 tablet (20 mg total) by mouth every other day.   30 tablet   0   . guaiFENesin-dextromethorphan (ROBITUSSIN DM) 100-10 MG/5ML syrup   Oral   Take 10 mLs by mouth every 4 (four) hours as needed for cough.         Marland Kitchen  HYDROcodone-acetaminophen (NORCO/VICODIN) 5-325 MG per tablet   Oral   Take 1-2 tablets by mouth every 4 (four) hours as needed for moderate pain.   20 tablet   0   . ipratropium-albuterol (DUONEB) 0.5-2.5 (3) MG/3ML SOLN   Nebulization   Take 3 mLs by nebulization every 6 (six) hours.   360 mL   0   . Magnesium Oxide 250 MG TABS   Oral   Take 250 mg by mouth daily.         Marland Kitchen oxybutynin (DITROPAN) 5 MG tablet   Oral   Take 1 tablet (5 mg total) by mouth daily.   30 tablet   0   . pantoprazole (PROTONIX) 40 MG tablet   Oral   Take 1 tablet (40 mg total) by mouth 2 (two) times daily.   60 tablet   0   . predniSONE (DELTASONE) 20 MG tablet   Oral   Take 2 tablets (40 mg total) by mouth daily.   8 tablet   0   . pregabalin (LYRICA) 150 MG capsule   Oral   Take 150 mg by mouth 2 (two) times daily.          Marland Kitchen rOPINIRole (REQUIP) 0.25 MG tablet   Oral   Take 3 tablets (0.75 mg total) by mouth at bedtime.   90 tablet   2   . senna-docusate (SENOKOT-S) 8.6-50 MG per tablet   Oral   Take 2 tablets by mouth daily.   60 tablet   0   . tiotropium (SPIRIVA) 18 MCG inhalation capsule   Inhalation   Place 18 mcg into inhaler and inhale daily.           Allergies Review of patient's allergies indicates no known allergies.  Family History  Problem Relation Age of Onset  . Hypertension Mother   . Diabetes    . Breast cancer Daughter     Social History Social History  Substance Use Topics  . Smoking status: Former Smoker    Quit date: 11/04/1983  . Smokeless tobacco: Not on file  . Alcohol Use: No    Review of Systems Constitutional: No fever. Positive for chills. Eyes: No visual changes. ENT: No sore throat. Cardiovascular: Positive for chest tightness. Respiratory: Positive for nonproductive cough and shortness of breath. Gastrointestinal: No abdominal pain.  No nausea, no vomiting.  No diarrhea.  No constipation. Genitourinary: Negative for dysuria. Musculoskeletal: Negative for back pain. Skin: Negative for rash. Neurological: Negative for headaches, focal weakness or numbness.  10-point ROS otherwise negative.  ____________________________________________   PHYSICAL EXAM:  VITAL SIGNS: ED Triage Vitals  Enc Vitals Group     BP 01/28/15 0352 135/61 mmHg     Pulse Rate 01/28/15 0352 69     Resp 01/28/15 0352 18     Temp 01/28/15 0352 98.2 F (36.8 C)     Temp Source 01/28/15 0352 Oral     SpO2 01/28/15 0352 98 %     Weight 01/28/15 0352 376 lb 15.8 oz (171 kg)     Height --      Head Cir --      Peak Flow --      Pain Score 01/28/15 0352 8     Pain Loc --      Pain Edu? --      Excl. in [place]? --     Constitutional: Alert and oriented. Well appearing and in mild acute distress. Eyes: Conjunctivae are normal. PERRL. EOMI.  Head: Atraumatic. Nose: No  congestion/rhinnorhea. Mouth/Throat: Mucous membranes are moist.  Oropharynx non-erythematous. Neck: No stridor.   Cardiovascular: Normal rate, regular rhythm. Grossly normal heart sounds.  Good peripheral circulation. Respiratory: Difficult to assess secondary to patient's large body habitus. Normal respiratory effort.  No retractions. Lungs diminished bibasilarly. Gastrointestinal: Morbidly obese. Soft and nontender. No distention. No abdominal bruits. No CVA tenderness. Musculoskeletal: No lower extremity tenderness. BLE compression wraps for edema.  No joint effusions. Neurologic:  Normal speech and language. No gross focal neurologic deficits are appreciated.  Skin:  Skin is warm, dry and intact. No rash noted. Psychiatric: Mood and affect are normal. Speech and behavior are normal.  ____________________________________________   LABS (all labs ordered are listed, but only abnormal results are displayed)  Labs Reviewed  LACTIC ACID, PLASMA  BRAIN NATRIURETIC PEPTIDE   ____________________________________________  EKG  ED ECG REPORT I, SUNG,JADE J, the attending physician, personally viewed and interpreted this ECG.   Date: 01/28/2015  EKG Time: 0401  Rate: 64  Rhythm: normal EKG, normal sinus rhythm  Axis: Normal  Intervals:none  ST&T Change: Nonspecific  ____________________________________________  RADIOLOGY  Portable chest x-ray (viewed by me, interpreted per Dr. Radene Knee):  Mild cardiomegaly. Minimal left basilar atelectasis noted.  ____________________________________________   PROCEDURES  Procedure(s) performed: None  Critical Care performed: No  ____________________________________________   INITIAL IMPRESSION / ASSESSMENT AND PLAN / ED COURSE  Pertinent labs & imaging results that were available during my care of the patient were reviewed by me and considered in my medical decision making (see chart for details).  64 year old female with a  history significant for COPD on continuous oxygen therapy who presents with nonproductive cough associated with increased shortness of breath and chest tightness. Symptoms consistent with COPD exacerbation. Will initiate IV Solu-Medrol, DuoNeb, obtain screening lab work, chest x-ray and reassess.  ----------------------------------------- 7:33 AM on 01/28/2015 -----------------------------------------  Reexamined patient. Remains with diminished aeration. Attempted to sit patient forward for auscultation posteriorly and patient noted to have increased work of breathing. Will administer second DuoNeb and discuss with hospitalist to evaluate in the emergency department for admission. ____________________________________________   FINAL CLINICAL IMPRESSION(S) / ED DIAGNOSES  Final diagnoses:  COPD exacerbation ([place])      Paulette Blanch, MD 01/28/15 [phone]

## 2015-01-28 NOTE — ED Notes (Signed)
Cough for a week and tonight felt shob with chest pain.

## 2015-01-28 NOTE — Evaluation (Signed)
Physical Therapy Evaluation Patient Details Name: Loretta Wade MRN: [id number] DOB: 11-20-1950 Today's Date: 01/28/2015   History of Present Illness  Pt is a 64 y/o female that presents with increased shortness of breath, has recently been admitted for renal failure. On previous admissions she has been very limited with ambulation.  Clinical Impression  Patient presents with shortness of breath and acute COPD exacerbation. She is very limited in mobility in this session due to both her body habitus and shortness of breath. Patient demonstrates increased effort with breathing to complete bed mobility and sit to stand transfers. She is very deconditioned and unable to support her body weight in standing for any length of time. Patient would benefit from short term rehabilitation to increase her mobility status as she lives alone and is now unable to stand without assistance. Skilled PT services are indicated to address her mobility deficits.     Follow Up Recommendations SNF    Equipment Recommendations       Recommendations for Other Services       Precautions / Restrictions Precautions Precautions: Fall Restrictions Weight Bearing Restrictions: No      Mobility  Bed Mobility Overal bed mobility: Needs Assistance Bed Mobility: Supine to Sit;Sit to Supine     Supine to sit: Min guard;Min assist Sit to supine: Min assist;Mod assist;+2 for physical assistance   General bed mobility comments: [name] is able to bridge through her LEs to transfer to the edge of the bed, she uses hand rails to complete transfer however she requires assistance through her trunk to sit. On return to supine she is unable to bring her legs over the threshold of the edge of the bed.   Transfers Overall transfer level: Needs assistance Equipment used: Rolling walker (2 wheeled) Transfers: Sit to/from Stand Sit to Stand: Mod assist;Max assist         General transfer comment: Patient requires  heavy assistance and cuing for hand placement to attempt standing x 2 attempts, she is never able to come fully upright secondary to weakness.  Ambulation/Gait                Stairs            Wheelchair Mobility    Modified Rankin (Stroke Patients Only)       Balance Overall balance assessment: Needs assistance   Sitting balance-[name] Scale: Fair Sitting balance - Comments: Patient is able to sit at the edge of the bed with her feet dangling and UEs in contact with the bed surface with no loss of balance.      Standing balance-[name] Scale: Poor Standing balance comment: Patient unable to achieve standing position secondary to weakness.                              Pertinent Vitals/Pain Pain Assessment:  (Patient reports she has been hurting all over, she does not rate her pain number.)    Home Living Family/patient expects to be discharged to:: Private residence Living Arrangements: Alone Available Help at Discharge: Family (Patient has aides that come to the house, unclear of how long they stay) Type of Home: House                Prior Function Level of Independence: Independent with assistive device(s)         Comments: Patient states she has been ambulating with RW, per previous notes she has had assistance with  bathing and other ADLs in the home.      Hand Dominance        Extremity/Trunk Assessment   Upper Extremity Assessment: Generalized weakness (Limited UE mobility bilaterally)           Lower Extremity Assessment: Generalized weakness (Unable to formally test as patient had increased breathing rate and requested to lay back down)         Communication   Communication: No difficulties  Cognition Arousal/Alertness: Awake/alert Behavior During Therapy: Flat affect;Anxious Overall Cognitive Status: Within Functional Limits for tasks assessed (This seems to be her baseline)                      General  Comments General comments (skin integrity, edema, etc.): Bilateral LE wrapping in place. Patient noted to have rapid breathing during any mobility attempt, this decreased while at rest.     Exercises        Assessment/Plan    PT Assessment Patient needs continued PT services  PT Diagnosis Difficulty walking;Generalized weakness   PT Problem List Decreased strength;Decreased mobility;Decreased safety awareness;Decreased activity tolerance;Cardiopulmonary status limiting activity;Decreased knowledge of use of DME;Decreased balance  PT Treatment Interventions DME instruction;Therapeutic activities;Therapeutic exercise;Gait training;Balance training   PT Goals (Current goals can be found in the Care Plan section) Acute Rehab PT Goals Patient Stated Goal: Patient does not want to return to rehab PT Goal Formulation: With patient Time For Goal Achievement: 02/11/15 Potential to Achieve Goals: Fair    Frequency Min 2X/week   Barriers to discharge Decreased caregiver support Patient lives alone and will require 24/7 assistance.     Co-evaluation               End of Session Equipment Utilized During Treatment: Gait belt;Oxygen Activity Tolerance: Patient limited by fatigue Patient left: in bed;with bed alarm set;with call bell/phone within reach Nurse Communication: Mobility status         Time: 1345-1359 PT Time Calculation (min) (ACUTE ONLY): 14 min   Charges:   PT Evaluation $Initial PT Evaluation Tier I: 1 Procedure     PT G Codes:       Kerman Passey, PT, DPT    01/28/2015, 2:51 PM

## 2015-01-28 NOTE — Progress Notes (Signed)
Patient arrived to floor this shift from ED.  Ambulated from ED stretcher to bed with very limited steps.  Patient has dyspnea with exertion and when turning in bed.  Chronic use of oxygen at 2 liters from home.  Patient attempted to use CPAP but stated that it was too cold and her home CPAP has a humidifier.  Morphine given to resolve amount of energy and anxiety associated with breathing.  Stated that she is under Hospice care.  Son visited and at bedside this shift.

## 2015-01-28 NOTE — Plan of Care (Signed)
Problem: Consults Goal: Skin Care Protocol Initiated - if Braden Score 18 or less If consults are not indicated, leave blank or document N/A  Outcome: Progressing Unna Boot change completed, sacral pad started.  Problem: Phase I Progression Outcomes Goal: Dyspnea controlled at rest Outcome: Not Progressing Patient has extreme dypsnea upon exertion. Goal: Progress activity as tolerated unless otherwise ordered Outcome: Not Progressing Unable to tolerate due to dyspnea Goal: Discharge plan established Outcome: Not Progressing Admission date  Problem: Phase II Progression Outcomes Goal: O2 sats > equal to 90% on RA or at baseline Outcome: Not Progressing Chronic 2 liters of oxygen

## 2015-01-28 NOTE — Progress Notes (Signed)
Patient has history of MRSA greater than 30 days.  Unclear if treatment was completed for MRSA in urine.  Processing MRSA PCR to determine if isolation should be implemented.

## 2015-01-29 LAB — BASIC METABOLIC PANEL
ANION GAP: 7 (ref 5–15)
BUN: 10 mg/dL (ref 6–20)
CHLORIDE: 103 mmol/L (ref 101–111)
CO2: 32 mmol/L (ref 22–32)
Calcium: 9.7 mg/dL (ref 8.9–10.3)
Creatinine, Ser: 0.74 mg/dL (ref 0.44–1.00)
GFR calc Af Amer: >60 mL/min (ref >60)
GFR calc non Af Amer: >60 mL/min (ref >60)
GLUCOSE: 161 mg/dL — AB (ref 65–99)
POTASSIUM: 3.9 mmol/L (ref 3.5–5.1)
SODIUM: 142 mmol/L (ref 135–145)

## 2015-01-29 LAB — CBC
HEMATOCRIT: 34.2 % — AB (ref 35.0–47.0)
HEMOGLOBIN: 10.8 g/dL — AB (ref 12.0–16.0)
MCH: 27.7 pg (ref 26.0–34.0)
MCHC: 31.7 g/dL — ABNORMAL LOW (ref 32.0–36.0)
MCV: 87.5 fL (ref 80.0–100.0)
Platelets: 239 10*3/uL (ref 150–440)
RBC: 3.9 MIL/uL (ref 3.80–5.20)
RDW: 17.3 % — AB (ref 11.5–14.5)
WBC: 5.6 10*3/uL (ref 3.6–11.0)

## 2015-01-29 LAB — EXPECTORATED SPUTUM ASSESSMENT W GRAM STAIN, RFLX TO RESP C: Special Requests: NORMAL

## 2015-01-29 LAB — EXPECTORATED SPUTUM ASSESSMENT W REFEX TO RESP CULTURE

## 2015-01-29 NOTE — Clinical Social Work Note (Signed)
Clinical Social Work Assessment  Patient Details  Name: Loretta Wade MRN: [id number] Date of Birth: [birth year]  Date of referral:  01/29/15               Reason for consult:  Facility Placement                Permission sought to share information with:  Case Manager Permission granted to share information::     Name::        Agency::     Relationship::     Contact Information:     Housing/Transportation Living arrangements for the past 2 months:  Single Family Home Source of Information:  Patient Patient Interpreter Needed:  None Criminal Activity/Legal Involvement Pertinent to Current Situation/Hospitalization:  No - Comment as needed Significant Relationships:  Adult Children Lives with:  Self Do you feel safe going back to the place where you live?  Yes Need for family participation in patient care:  Yes (Comment)  Care giving concerns: Patient lives alone in [place].    Social Worker assessment / plan: Holiday representative (CSW) received SNF consult. PT is recommending SNF. Patient is followed by [facility] at home. CSW met with patient to discuss D/C plan. Patient was alert and oriented and lying in the bed. Patient reported that she lives in [place] alone however she has lots of family members and friends that visit her. Patient's son Loretta Wade lives in [place] and is her primary support. Patient reported that she is followed by A/C hospice and has a hospital bed and all the equipment she needs. CSW explained that PT is recommending SNF. Patient refused SNF and reported that she is going home and will resume hospice services. CSW will continue to follow and assist as needed.   Employment status:  Disabled (Comment on whether or not currently receiving Disability), Retired Forensic scientist:  Medicare PT Recommendations:  [place] / Referral to community resources:  Other (Comment Required) (Patient refusing SNF  )  Patient/Family's Response to care:  Patient refused SNF and wants to go home and resume hospice services.   Patient/Family's Understanding of and Emotional Response to Diagnosis, Current Treatment, and Prognosis: Patient was pleasant throughout assessment.   Emotional Assessment Appearance:  Appears stated age Attitude/Demeanor/Rapport:    Affect (typically observed):  Accepting, Adaptable, Pleasant Orientation:  Oriented to Self, Oriented to Place, Oriented to  Time, Oriented to Situation Alcohol / Substance use:  Not Applicable Psych involvement (Current and /or in the community):  No (Comment)  Discharge Needs  Concerns to be addressed:  Discharge Planning Concerns Readmission within the last 30 days:  No Current discharge risk:  Dependent with Mobility Barriers to Discharge:  Continued Medical Work up   Loralyn Freshwater, LCSW 01/29/2015, 4:00 PM

## 2015-01-29 NOTE — Plan of Care (Signed)
Problem: Phase II Progression Outcomes Goal: O2 sats > equal to 90% on RA or at baseline Outcome: Not Progressing Patient chronically on 2 liters oxygen at home.

## 2015-01-29 NOTE — Progress Notes (Signed)
Patient ID: Loretta Wade, female   DOB: Jul 01, 1950, 64 y.o.   MRN: [id number]  [facility] PROGRESS NOTE  PCP: Keith Rake, MD  HPI/Subjective: Patient still not feeling well very short of breath still coughing and wheezing.  Objective: Filed Vitals:   01/29/15 0722 01/29/15 1434  BP: 114/54 120/47  Pulse: 88 120  Temp: 98.9 F (37.2 C) 97.9 F (36.6 C)  Resp: 18 18    Filed Weights   01/28/15 0352 01/28/15 1033  Weight: 171 kg (376 lb 15.8 oz) 163.295 kg (360 lb)    ROS: Review of Systems  Constitutional: Negative for fever and chills.  Eyes: Negative for blurred vision.  Respiratory: Positive for cough, shortness of breath and wheezing.   Cardiovascular: Negative for chest pain.  Gastrointestinal: Negative for nausea, vomiting, abdominal pain, diarrhea and constipation.  Genitourinary: Negative for dysuria.  Musculoskeletal: Negative for joint pain.  Neurological: Negative for dizziness and headaches.   Exam: Physical Exam  Constitutional: She is oriented to person, place, and time.  HENT:  Nose: No mucosal edema.  Mouth/Throat: No oropharyngeal exudate or posterior oropharyngeal edema.  Eyes: Conjunctivae, EOM and lids are normal. Pupils are equal, round, and reactive to light.  Neck: No JVD present. Carotid bruit is not present. No edema present. No thyroid mass and no thyromegaly present.  Cardiovascular: S1 normal and S2 normal.  Exam reveals no gallop.   No murmur heard. Pulses:      Dorsalis pedis pulses are 2+ on the right side, and 2+ on the left side.  Respiratory: No respiratory distress. She has decreased breath sounds in the right middle field, the right lower field, the left middle field and the left lower field. She has wheezes in the right upper field, the right middle field, the right lower field, the left upper field, the left middle field and the left lower field. She has no rhonchi. She has no rales.  GI: Soft. Bowel sounds are  normal. There is no tenderness.  Musculoskeletal:       Right ankle: She exhibits swelling.       Left ankle: She exhibits swelling.  Lymphadenopathy:    She has no cervical adenopathy.  Neurological: She is alert and oriented to person, place, and time. No cranial nerve deficit.  Skin: Skin is warm. No rash noted. Nails show no clubbing.  Psychiatric: She has a normal mood and affect.    Data Reviewed: Basic Metabolic Panel:  Recent Labs Lab 01/28/15 0430 01/29/15 0413  NA 142 142  K 3.7 3.9  CL 103 103  CO2 34* 32  GLUCOSE 119* 161*  BUN 6 10  CREATININE 0.72 0.74  CALCIUM 9.1 9.7   Liver Function Tests:  Recent Labs Lab 01/28/15 0430  AST 17  ALT 13*  ALKPHOS 77  BILITOT 0.5  PROT 6.6  ALBUMIN 3.5   CBC:  Recent Labs Lab 01/28/15 0414 01/29/15 0413  WBC 4.7 5.6  NEUTROABS 2.3  --   HGB 10.8* 10.8*  HCT 35.1 34.2*  MCV 87.9 87.5  PLT 248 239   Cardiac Enzymes:  Recent Labs Lab 01/28/15 0430  TROPONINI <0.03   BNP (last 3 results)  Recent Labs  07/08/14 1515 11/04/14 1154 01/28/15 0414  BNP 18.0 38.0 35.0     Recent Results (from the past 240 hour(s))  MRSA PCR Screening     Status: None   Collection Time: 01/28/15 11:52 AM  Result Value Ref Range Status  MRSA by PCR NEGATIVE NEGATIVE Final    Comment:        The GeneXpert MRSA Assay (FDA approved for NASAL specimens only), is one component of a comprehensive MRSA colonization surveillance program. It is not intended to diagnose MRSA infection nor to guide or monitor treatment for MRSA infections.   Urine culture     Status: None (Preliminary result)   Collection Time: 01/28/15 12:39 PM  Result Value Ref Range Status   Specimen Description URINE, CLEAN CATCH  Final   Special Requests Normal  Final   Culture HOLDING FOR POSSIBLE PATHOGEN  Final   Report Status PENDING  Incomplete     Studies: Dg Chest Port 1 View  01/28/2015  CLINICAL DATA:  Acute onset of cough and  shortness of breath. Generalized chest pain. Initial encounter. EXAM: PORTABLE CHEST 1 VIEW COMPARISON:  Chest radiograph performed 12/15/2014 FINDINGS: The lungs are well-aerated. Pulmonary vascularity is at the upper limits of normal. Minimal left basilar atelectasis is noted. There is no evidence of pleural effusion or pneumothorax. The cardiomediastinal silhouette is mildly enlarged. No acute osseous abnormalities are seen. IMPRESSION: Mild cardiomegaly.  Minimal left basilar atelectasis noted. Electronically Signed   By: Garald Balding M.D.   On: 01/28/2015 05:11    Scheduled Meds: . albuterol  2.5 mg Nebulization TID  . budesonide (PULMICORT) nebulizer solution  0.25 mg Nebulization BID  . cholecalciferol  1,000 Units Oral Daily  . docusate sodium  100 mg Oral BID  . enoxaparin (LOVENOX) injection  40 mg Subcutaneous BID  . furosemide  40 mg Oral Daily  . levofloxacin  500 mg Oral Daily  . magnesium oxide  400 mg Oral Daily  . methylPREDNISolone (SOLU-MEDROL) injection  60 mg Intravenous Q6H  . morphine  15 mg Oral Q12H  . morphine  30 mg Oral Q12H  . oxybutynin  5 mg Oral Daily  . pantoprazole  40 mg Oral BID  . pregabalin  150 mg Oral BID  . rOPINIRole  0.75 mg Oral QHS  . senna-docusate  2 tablet Oral Daily  . sodium chloride  3 mL Intravenous Q12H    Assessment/Plan:  1. COPD exacerbation, chronic respiratory failure on 2 L of oxygen. Continue Solu-Medrol 60 mg IV every 6 hours and oral Levaquin. Continue nebulizer treatments and budesonide nebulizers. 2. Gastroesophageal reflux disease without esophagitis continue PPI 3. Essential hypertension- continue usual medications 4. Fibromyalgia and chronic pain- continue chronic pain medications 5. Lower extremity edema continue increased dose of Lasix 40 mg daily  Code Status:     Code Status Orders        Start     Ordered   01/28/15 0808  Full code   Continuous     01/28/15 0808     Disposition Plan: Home once  breathing better  Antibiotics:  Levaquin  Time spent: 22 minutes  Loletha Grayer  [facility]

## 2015-01-29 NOTE — Progress Notes (Signed)
Patient cooperative with care.  Patient dyspnea is improving this shift with course of steroids.  Patient ambulating up to [facility] this shift with one assist.  Patient had stool softener and laxative this shift and has had couple of stools.  Please hold tomorrow per patient request.  Sputum culture was not accepted and needs to be repeated.

## 2015-01-29 NOTE — Care Management Note (Signed)
Case Management Note  Patient Details  Name: Loretta Wade MRN: [id number] Date of Birth: 29-Dec-1950  Subjective/Objective:   [facility]-PT recommended Rehab on 01/28/15. This Probation officer discussed discharge options with Loretta Wade who was adamant that she wants to return home after this hospital discharge. Blima Rich, CSW was updated.                  Action/Plan:   Expected Discharge Date:                  Expected Discharge Plan:     In-House Referral:     Discharge planning Services     Post Acute Care Choice:    Choice offered to:     DME Arranged:    DME Agency:     HH Arranged:    [place] Agency:     Status of Service:     Medicare Important Message Given:    Date Medicare IM Given:    Medicare IM give by:    Date Additional Medicare IM Given:    Additional Medicare Important Message give by:     If discussed at [place] of Stay Meetings, dates discussed:    Additional Comments:  Dewey Neukam A, RN 01/29/2015, 3:03 PM

## 2015-01-29 NOTE — Care Management Note (Addendum)
Case Management Note  Patient Details  Name: Loretta Wade MRN: [id number] Date of Birth: 13-Jan-1951  Subjective/Objective: 64yo Ms [name] was admitted 01/28/15 per COPD exacerbation. Lives alone. She is followed in her home by [facility] who provides hospice nursing and nurse aid care. Chronic 2L N/C at home. Assistive equipment includes a rolling walker, a BSC, and a wheelchair. PCP=Dr Keith Rake, but now a doctor from [place] comes to her home.  Pharmacy=[facility]. [name] reports that numerous family members check in on her at home. Anticipate discharge home with resumption of Hospice services. [facility] notified. Case management will follow for discharge planning.                 Action/Plan:   Expected Discharge Date:                  Expected Discharge Plan:     In-House Referral:     Discharge planning Services     Post Acute Care Choice:    Choice offered to:     DME Arranged:    DME Agency:     HH Arranged:    [place] Agency:     Status of Service:     Medicare Important Message Given:    Date Medicare IM Given:    Medicare IM give by:    Date Additional Medicare IM Given:    Additional Medicare Important Message give by:     If discussed at [place] of Stay Meetings, dates discussed:    Additional Comments:  Kalyse Meharg A, RN 01/29/2015, 10:53 AM

## 2015-01-29 NOTE — Plan of Care (Signed)
Problem: Phase I Progression Outcomes Goal: Dyspnea controlled at rest Outcome: Progressing Now able to stand and transfer to bedside commode without difficulty. Goal: Pain controlled Outcome: Progressing Pain controled with oral medications  Problem: Phase II Progression Outcomes Goal: Pain controlled on oral analgesia Outcome: Completed/Met Date Met:  01/29/15 Pain controled with  Oral medications. Goal: Dyspnea controlled w/progressive activity Outcome: Progressing Able to transfer to bedside commode

## 2015-01-30 ENCOUNTER — Inpatient Hospital Stay

## 2015-01-30 LAB — EXPECTORATED SPUTUM ASSESSMENT W GRAM STAIN, RFLX TO RESP C

## 2015-01-30 LAB — EXPECTORATED SPUTUM ASSESSMENT W REFEX TO RESP CULTURE

## 2015-01-30 MED ORDER — CEFTRIAXONE SODIUM 1 G IJ SOLR
1.0000 g | INTRAMUSCULAR | Status: DC
  Administered 2015-01-30 – 2015-02-02 (×4): 1 g via INTRAVENOUS
  Filled 2015-01-30 (×5): qty 10

## 2015-01-30 MED ORDER — MAGNESIUM SULFATE 2 GM/50ML IV SOLN
2.0000 g | Freq: Once | INTRAVENOUS | Status: AC
  Administered 2015-01-30: 2 g via INTRAVENOUS
  Filled 2015-01-30: qty 50

## 2015-01-30 MED ORDER — AZITHROMYCIN 250 MG PO TABS
500.0000 mg | ORAL_TABLET | Freq: Every day | ORAL | Status: AC
  Administered 2015-01-30: 500 mg via ORAL
  Filled 2015-01-30: qty 2

## 2015-01-30 MED ORDER — AZITHROMYCIN 250 MG PO TABS
250.0000 mg | ORAL_TABLET | Freq: Every day | ORAL | Status: DC
  Administered 2015-01-31 – 2015-02-01 (×2): 250 mg via ORAL
  Filled 2015-01-30 (×2): qty 1

## 2015-01-30 MED ORDER — KETOROLAC TROMETHAMINE 30 MG/ML IJ SOLN
30.0000 mg | Freq: Once | INTRAMUSCULAR | Status: AC
  Administered 2015-01-30: 30 mg via INTRAVENOUS
  Filled 2015-01-30: qty 1

## 2015-01-30 NOTE — Progress Notes (Addendum)
Patient ID: Loretta Wade, female   DOB: 28-Apr-1950, 64 y.o.   MRN: [id number]  [facility] PROGRESS NOTE  PCP: Keith Rake, MD  HPI/Subjective: Patient still not feeling well. Still very short of breath still coughing and wheezing.  Objective: Filed Vitals:   01/30/15 0729 01/30/15 1101  BP: 126/64 126/53  Pulse: 76 85  Temp: 98.5 F (36.9 C) 97.8 F (36.6 C)  Resp: 20 18    Filed Weights   01/28/15 0352 01/28/15 1033  Weight: 171 kg (376 lb 15.8 oz) 163.295 kg (360 lb)    ROS: Review of Systems  Constitutional: Negative for fever and chills.  Eyes: Negative for blurred vision.  Respiratory: Positive for cough, shortness of breath and wheezing.   Cardiovascular: Negative for chest pain.  Gastrointestinal: Negative for nausea, vomiting, abdominal pain, diarrhea and constipation.  Genitourinary: Negative for dysuria.  Musculoskeletal: Negative for joint pain.  Neurological: Negative for dizziness and headaches.   Exam: Physical Exam  Constitutional: She is oriented to person, place, and time.  HENT:  Nose: No mucosal edema.  Mouth/Throat: No oropharyngeal exudate or posterior oropharyngeal edema.  Eyes: Conjunctivae, EOM and lids are normal. Pupils are equal, round, and reactive to light.  Neck: No JVD present. Carotid bruit is not present. No edema present. No thyroid mass and no thyromegaly present.  Cardiovascular: S1 normal and S2 normal.  Exam reveals no gallop.   No murmur heard. Pulses:      Dorsalis pedis pulses are 2+ on the right side, and 2+ on the left side.  Respiratory: No respiratory distress. She has decreased breath sounds in the right middle field, the right lower field, the left middle field and the left lower field. She has wheezes in the right upper field, the right middle field, the right lower field, the left upper field, the left middle field and the left lower field. She has no rhonchi. She has no rales.  GI: Soft. Bowel sounds  are normal. There is no tenderness.  Musculoskeletal:       Right ankle: She exhibits swelling.       Left ankle: She exhibits swelling.  Lymphadenopathy:    She has no cervical adenopathy.  Neurological: She is alert and oriented to person, place, and time. No cranial nerve deficit.  Skin: Skin is warm. No rash noted. Nails show no clubbing.  Psychiatric: She has a normal mood and affect.    Data Reviewed: Basic Metabolic Panel:  Recent Labs Lab 01/28/15 0430 01/29/15 0413  NA 142 142  K 3.7 3.9  CL 103 103  CO2 34* 32  GLUCOSE 119* 161*  BUN 6 10  CREATININE 0.72 0.74  CALCIUM 9.1 9.7   Liver Function Tests:  Recent Labs Lab 01/28/15 0430  AST 17  ALT 13*  ALKPHOS 77  BILITOT 0.5  PROT 6.6  ALBUMIN 3.5   CBC:  Recent Labs Lab 01/28/15 0414 01/29/15 0413  WBC 4.7 5.6  NEUTROABS 2.3  --   HGB 10.8* 10.8*  HCT 35.1 34.2*  MCV 87.9 87.5  PLT 248 239   Cardiac Enzymes:  Recent Labs Lab 01/28/15 0430  TROPONINI <0.03   BNP (last 3 results)  Recent Labs  07/08/14 1515 11/04/14 1154 01/28/15 0414  BNP 18.0 38.0 35.0     Recent Results (from the past 240 hour(s))  MRSA PCR Screening     Status: None   Collection Time: 01/28/15 11:52 AM  Result Value Ref Range Status  MRSA by PCR NEGATIVE NEGATIVE Final    Comment:        The GeneXpert MRSA Assay (FDA approved for NASAL specimens only), is one component of a comprehensive MRSA colonization surveillance program. It is not intended to diagnose MRSA infection nor to guide or monitor treatment for MRSA infections.   Urine culture     Status: None (Preliminary result)   Collection Time: 01/28/15 12:39 PM  Result Value Ref Range Status   Specimen Description URINE, CLEAN CATCH  Final   Special Requests Normal  Final   Culture   Final    50,000 COLONIES/mL ESCHERICHIA COLI HOLDING FOR ADDITIONAL POSSIBLE PATHOGENS    Report Status PENDING  Incomplete   Organism ID, Bacteria ESCHERICHIA  COLI  Final      Susceptibility   Escherichia coli - MIC*    AMPICILLIN >=32 RESISTANT Resistant     CEFAZOLIN <=4 SENSITIVE Sensitive     CEFTRIAXONE <=1 SENSITIVE Sensitive     CIPROFLOXACIN >=4 RESISTANT Resistant     GENTAMICIN 2 SENSITIVE Sensitive     IMIPENEM <=0.25 SENSITIVE Sensitive     NITROFURANTOIN <=16 SENSITIVE Sensitive     TRIMETH/SULFA >=320 RESISTANT Resistant     PIP/TAZO Value in next row Sensitive      SENSITIVE<=4    LEVOFLOXACIN Value in next row Resistant      RESISTANT>=8    * 50,000 COLONIES/mL ESCHERICHIA COLI  Culture, expectorated sputum-assessment     Status: None   Collection Time: 01/29/15  9:49 AM  Result Value Ref Range Status   Specimen Description EXPECTORATED SPUTUM  Final   Special Requests Normal  Final   Sputum evaluation   Final    Sputum specimen not acceptable for testing.  Please recollect.   Results Called to: KIM [facility] 01/29/2015 1528 BY JRS.    Report Status 01/29/2015 FINAL  Final  Culture, expectorated sputum-assessment     Status: None   Collection Time: 01/30/15 12:22 PM  Result Value Ref Range Status   Specimen Description EXPECTORATED SPUTUM  Final   Special Requests NONE  Final   Sputum evaluation THIS SPECIMEN IS ACCEPTABLE FOR SPUTUM CULTURE  Final   Report Status 01/30/2015 FINAL  Final     Studies: Dg Chest 2 View  01/30/2015  CLINICAL DATA:  Cough, shortness of breath, wheezing EXAM: CHEST  2 VIEW COMPARISON:  01/28/2015 FINDINGS: Cardiomediastinal silhouette is stable. No acute infiltrate or pulmonary edema. Mild left basilar atelectasis. Linear atelectasis noted in lingula. Limited study by patient's large body habitus. IMPRESSION: No infiltrate or pulmonary edema. Linear atelectasis noted in lingula and left base. Electronically Signed   By: Lahoma Crocker M.D.   On: 01/30/2015 15:03    Scheduled Meds: . albuterol  2.5 mg Nebulization TID  . budesonide (PULMICORT) nebulizer solution  0.25 mg Nebulization BID  .  cholecalciferol  1,000 Units Oral Daily  . docusate sodium  100 mg Oral BID  . enoxaparin (LOVENOX) injection  40 mg Subcutaneous BID  . furosemide  40 mg Oral Daily  . levofloxacin  500 mg Oral Daily  . magnesium oxide  400 mg Oral Daily  . methylPREDNISolone (SOLU-MEDROL) injection  60 mg Intravenous Q6H  . morphine  15 mg Oral Q12H  . morphine  30 mg Oral Q12H  . oxybutynin  5 mg Oral Daily  . pantoprazole  40 mg Oral BID  . pregabalin  150 mg Oral BID  . rOPINIRole  0.75 mg Oral QHS  .  senna-docusate  2 tablet Oral Daily  . sodium chloride  3 mL Intravenous Q12H    Assessment/Plan:  1. COPD exacerbation, chronic respiratory failure on 2 L of oxygen. Continue Solu-Medrol 60 mg IV every 6 hours. Continue nebulizer treatments and budesonide nebulizers. Switch antibiotics to Rocephin and Zithromax. 2. Gastroesophageal reflux disease without esophagitis continue PPI 3. Essential hypertension- continue usual medications 4. Fibromyalgia and chronic pain- continue chronic pain medications 5. Lower extremity edema continue increased dose of Lasix 40 mg daily 6. Headache- dose of Toradol and magnesium 7. Acute cystitis without hematuria- urine culture only growing out 50,000 Escherichia coli. Change antibiotic to Rocephin  Code Status:     Code Status Orders        Start     Ordered   01/28/15 0808  Full code   Continuous     01/28/15 0808     Disposition Plan: Home once breathing better  Antibiotics:  Rocephin and Zithromax  Time spent: 22 minutes  Loletha Grayer  [facility] Hospitalists

## 2015-01-30 NOTE — Progress Notes (Signed)
Physical Therapy Treatment Patient Details Name: Loretta Wade MRN: [id number] DOB: 05-28-1950 Today's Date: 01/30/2015    History of Present Illness Pt is a 64 y/o female that presents with increased shortness of breath, has recently been admitted for renal failure. On previous admissions she has been very limited with ambulation.    PT Comments    Pt with labored breathing t/o session on 2 liters (low 90s much of the time, does drop to 88% after ambulation).  She is guarded with exercises and standing/walking, but ultimately shows good effort and ability to be safe with limited standing/ambulation.  She reports feeling that HHPT, hospice and assist family will be enough for her to be safe and she is very clear about not wanting to go to rehab.  Follow Up Recommendations  Home health PT (pt does not want rehab, will need extra help at home)     Equipment Recommendations       Recommendations for Other Services       Precautions / Restrictions Precautions Precautions: Fall Restrictions Weight Bearing Restrictions: No    Mobility  Bed Mobility Overal bed mobility: Modified Independent Bed Mobility: Supine to Sit     Supine to sit: Min guard     General bed mobility comments: Pt does well getting to sitting despite having some shortness of breath   Transfers Overall transfer level: Modified independent Equipment used: Rolling walker (2 wheeled) Transfers: Sit to/from Stand Sit to Stand: Min guard         General transfer comment: Pt is able to get to standing from EOB w/o direct physical assist (pt short and nearly "standing" once feet get to the ground  Ambulation/Gait Ambulation/Gait assistance: Min assist Ambulation Distance (Feet): 8 Feet Assistive device: Rolling walker (2 wheeled)       General Gait Details: Pt with slow, guarded steps but has no losses of balance.  She does quickly fatigue and have some shortness of breath but generally appears to  have decent confidence and is generally safe.   Stairs            Wheelchair Mobility    Modified Rankin (Stroke Patients Only)       Balance                                    Cognition Arousal/Alertness: Awake/alert Behavior During Therapy: WFL for tasks assessed/performed Overall Cognitive Status: Within Functional Limits for tasks assessed                      Exercises General Exercises - Lower Extremity Ankle Circles/Pumps: AROM;Strengthening;10 reps;Both Quad Sets: Strengthening;10 reps;Both Gluteal Sets: Strengthening;10 reps;Both Heel Slides: AROM;Strengthening;10 reps;Both Hip ABduction/ADduction: Strengthening;AROM;10 reps;Both Straight Leg Raises: AAROM;10 reps;Both    General Comments        Pertinent Vitals/Pain Pain Assessment:  (reports general back and leg pain)    Home Living Family/patient expects to be discharged to:: Private residence                    Prior Function            PT Goals (current goals can now be found in the care plan section) Progress towards PT goals: Progressing toward goals    Frequency  Min 2X/week    PT Plan Current plan remains appropriate    Co-evaluation  End of Session Equipment Utilized During Treatment: Gait belt;Oxygen (2 liters) Activity Tolerance: Patient limited by fatigue Patient left: with chair alarm set;with call bell/phone within reach     Time: 1101-1132 PT Time Calculation (min) (ACUTE ONLY): 31 min  Charges:  $Gait Training: 8-22 mins $Therapeutic Exercise: 8-22 mins                    G Codes:     Wayne Both, PT, DPT [phone]  Kreg Shropshire 01/30/2015, 12:40 PM

## 2015-01-31 LAB — URINE CULTURE: Special Requests: NORMAL

## 2015-01-31 MED ORDER — METHYLPREDNISOLONE SODIUM SUCC 40 MG IJ SOLR
40.0000 mg | Freq: Four times a day (QID) | INTRAMUSCULAR | Status: DC
  Administered 2015-01-31 – 2015-02-02 (×8): 40 mg via INTRAVENOUS
  Filled 2015-01-31 (×9): qty 1

## 2015-01-31 NOTE — Progress Notes (Signed)
Patient ID: Loretta Wade, female   DOB: December 15, 1950, 64 y.o.   MRN: [id number]  [facility] PROGRESS NOTE  PCP: Keith Rake, MD  HPI/Subjective: Patient seen some blood in the stool with wiping and some blood from the nose. She feels weak. Patient feeling a little bit better since coming in here. Still very short of breath still coughing and wheezing.  Objective: Filed Vitals:   01/31/15 0315 01/31/15 0812  BP: 147/74 130/64  Pulse: 68 81  Temp: 98.6 F (37 C) 98.3 F (36.8 C)  Resp: 18 19    Filed Weights   01/28/15 0352 01/28/15 1033  Weight: 171 kg (376 lb 15.8 oz) 163.295 kg (360 lb)    ROS: Review of Systems  Constitutional: Negative for fever and chills.  HENT: Positive for nosebleeds.   Eyes: Negative for blurred vision.  Respiratory: Positive for cough, shortness of breath and wheezing.   Cardiovascular: Negative for chest pain.  Gastrointestinal: Positive for blood in stool. Negative for nausea, vomiting, abdominal pain, diarrhea and constipation.  Genitourinary: Negative for dysuria.  Musculoskeletal: Negative for joint pain.  Neurological: Negative for dizziness and headaches.   Exam: Physical Exam  Constitutional: She is oriented to person, place, and time.  HENT:  Nose: No mucosal edema.  Mouth/Throat: No oropharyngeal exudate or posterior oropharyngeal edema.  Eyes: Conjunctivae, EOM and lids are normal. Pupils are equal, round, and reactive to light.  Neck: No JVD present. Carotid bruit is not present. No edema present. No thyroid mass and no thyromegaly present.  Cardiovascular: S1 normal and S2 normal.  Exam reveals no gallop.   No murmur heard. Pulses:      Dorsalis pedis pulses are 2+ on the right side, and 2+ on the left side.  Respiratory: No respiratory distress. She has decreased breath sounds in the right lower field and the left lower field. She has wheezes in the right lower field and the left lower field. She has no rhonchi.  She has no rales.  GI: Soft. Bowel sounds are normal. There is no tenderness.  Musculoskeletal:       Right ankle: She exhibits swelling.       Left ankle: She exhibits swelling.  Lymphadenopathy:    She has no cervical adenopathy.  Neurological: She is alert and oriented to person, place, and time. No cranial nerve deficit.  Skin: Skin is warm. No rash noted. Nails show no clubbing.  Psychiatric: She has a normal mood and affect.    Data Reviewed: Basic Metabolic Panel:  Recent Labs Lab 01/28/15 0430 01/29/15 0413  NA 142 142  K 3.7 3.9  CL 103 103  CO2 34* 32  GLUCOSE 119* 161*  BUN 6 10  CREATININE 0.72 0.74  CALCIUM 9.1 9.7   Liver Function Tests:  Recent Labs Lab 01/28/15 0430  AST 17  ALT 13*  ALKPHOS 77  BILITOT 0.5  PROT 6.6  ALBUMIN 3.5   CBC:  Recent Labs Lab 01/28/15 0414 01/29/15 0413  WBC 4.7 5.6  NEUTROABS 2.3  --   HGB 10.8* 10.8*  HCT 35.1 34.2*  MCV 87.9 87.5  PLT 248 239   Cardiac Enzymes:  Recent Labs Lab 01/28/15 0430  TROPONINI <0.03   BNP (last 3 results)  Recent Labs  07/08/14 1515 11/04/14 1154 01/28/15 0414  BNP 18.0 38.0 35.0     Recent Results (from the past 240 hour(s))  MRSA PCR Screening     Status: None   Collection Time:  01/28/15 11:52 AM  Result Value Ref Range Status   MRSA by PCR NEGATIVE NEGATIVE Final    Comment:        The GeneXpert MRSA Assay (FDA approved for NASAL specimens only), is one component of a comprehensive MRSA colonization surveillance program. It is not intended to diagnose MRSA infection nor to guide or monitor treatment for MRSA infections.   Urine culture     Status: None (Preliminary result)   Collection Time: 01/28/15 12:39 PM  Result Value Ref Range Status   Specimen Description URINE, CLEAN CATCH  Final   Special Requests Normal  Final   Culture   Final    50,000 COLONIES/mL ESCHERICHIA COLI HOLDING FOR ADDITIONAL POSSIBLE PATHOGENS    Report Status PENDING   Incomplete   Organism ID, Bacteria ESCHERICHIA COLI  Final      Susceptibility   Escherichia coli - MIC*    AMPICILLIN >=32 RESISTANT Resistant     CEFAZOLIN <=4 SENSITIVE Sensitive     CEFTRIAXONE <=1 SENSITIVE Sensitive     CIPROFLOXACIN >=4 RESISTANT Resistant     GENTAMICIN 2 SENSITIVE Sensitive     IMIPENEM <=0.25 SENSITIVE Sensitive     NITROFURANTOIN <=16 SENSITIVE Sensitive     TRIMETH/SULFA >=320 RESISTANT Resistant     PIP/TAZO Value in next row Sensitive      SENSITIVE<=4    LEVOFLOXACIN Value in next row Resistant      RESISTANT>=8    * 50,000 COLONIES/mL ESCHERICHIA COLI  Culture, expectorated sputum-assessment     Status: None   Collection Time: 01/29/15  9:49 AM  Result Value Ref Range Status   Specimen Description EXPECTORATED SPUTUM  Final   Special Requests Normal  Final   Sputum evaluation   Final    Sputum specimen not acceptable for testing.  Please recollect.   Results Called to: K[facility] 01/29/2015 1528 BY JRS.    Report Status 01/29/2015 FINAL  Final  Culture, expectorated sputum-assessment     Status: None   Collection Time: 01/30/15 12:22 PM  Result Value Ref Range Status   Specimen Description EXPECTORATED SPUTUM  Final   Special Requests NONE  Final   Sputum evaluation THIS SPECIMEN IS ACCEPTABLE FOR SPUTUM CULTURE  Final   Report Status 01/30/2015 FINAL  Final  Culture, respiratory (NON-Expectorated)     Status: None (Preliminary result)   Collection Time: 01/30/15 12:22 PM  Result Value Ref Range Status   Specimen Description EXPECTORATED SPUTUM  Final   Special Requests NONE Reflexed from [id number]  Final   Gram Stain PENDING  Incomplete   Culture HOLDING FOR POSSIBLE PATHOGEN  Final   Report Status PENDING  Incomplete     Studies: Dg Chest 2 View  01/30/2015  CLINICAL DATA:  Cough, shortness of breath, wheezing EXAM: CHEST  2 VIEW COMPARISON:  01/28/2015 FINDINGS: Cardiomediastinal silhouette is stable. No acute infiltrate or pulmonary  edema. Mild left basilar atelectasis. Linear atelectasis noted in lingula. Limited study by patient's large body habitus. IMPRESSION: No infiltrate or pulmonary edema. Linear atelectasis noted in lingula and left base. Electronically Signed   By: Lahoma Crocker M.D.   On: 01/30/2015 15:03    Scheduled Meds: . albuterol  2.5 mg Nebulization TID  . azithromycin  250 mg Oral Daily  . budesonide (PULMICORT) nebulizer solution  0.25 mg Nebulization BID  . cefTRIAXone (ROCEPHIN)  IV  1 g Intravenous Q24H  . cholecalciferol  1,000 Units Oral Daily  . docusate sodium  100 mg  Oral BID  . enoxaparin (LOVENOX) injection  40 mg Subcutaneous BID  . furosemide  40 mg Oral Daily  . magnesium oxide  400 mg Oral Daily  . methylPREDNISolone (SOLU-MEDROL) injection  40 mg Intravenous Q6H  . morphine  15 mg Oral Q12H  . morphine  30 mg Oral Q12H  . oxybutynin  5 mg Oral Daily  . pantoprazole  40 mg Oral BID  . pregabalin  150 mg Oral BID  . rOPINIRole  0.75 mg Oral QHS  . senna-docusate  2 tablet Oral Daily  . sodium chloride  3 mL Intravenous Q12H    Assessment/Plan:  1. COPD exacerbation, chronic respiratory failure on 2 L of oxygen. Try to decrease Solu-Medrol 40 mg IV every 6 hours. Continue nebulizer treatments and budesonide nebulizers. Continue antibiotics to Rocephin and Zithromax. 2. Gastroesophageal reflux disease without esophagitis continue PPI 3. Essential hypertension- continue usual medications 4. Fibromyalgia and chronic pain- continue chronic pain medications 5. Lower extremity edema continue increased dose of Lasix 40 mg daily 6. Headache- dose of Toradol and magnesium 7. Acute cystitis without hematuria- urine culture only growing out 50,000 Escherichia coli. Change antibiotic to Rocephin 8. Epistaxis and blood in the stool- check hemoglobin tomorrow morning and stop Lovenox injections.  Code Status:     Code Status Orders        Start     Ordered   01/28/15 0808  Full code    Continuous     01/28/15 0808     Disposition Plan: Home once breathing better  Antibiotics:  Rocephin and Zithromax  Time spent: 22 minutes  Loletha Grayer  [facility]

## 2015-01-31 NOTE — Progress Notes (Signed)
Pt complaining of burning pain left lower leg below knee on the outside. Took down [employer] to assess skin. Skin is unremarkable and does not even look like she had cellulitis. Redressed area pt did state she felt some relief after dressing was loosened. Pt up out of bed many times today to bathroom her endurance seems to be better. Her cough has become more productive as well.

## 2015-02-01 ENCOUNTER — Encounter: Payer: Self-pay | Admitting: *Deleted

## 2015-02-01 ENCOUNTER — Other Ambulatory Visit: Payer: Self-pay | Admitting: *Deleted

## 2015-02-01 ENCOUNTER — Inpatient Hospital Stay

## 2015-02-01 LAB — BASIC METABOLIC PANEL
ANION GAP: 6 (ref 5–15)
BUN: 23 mg/dL — AB (ref 6–20)
CALCIUM: 9 mg/dL (ref 8.9–10.3)
CO2: 32 mmol/L (ref 22–32)
Chloride: 102 mmol/L (ref 101–111)
Creatinine, Ser: 0.75 mg/dL (ref 0.44–1.00)
GFR calc Af Amer: >60 mL/min (ref >60)
GFR calc non Af Amer: >60 mL/min (ref >60)
GLUCOSE: 162 mg/dL — AB (ref 65–99)
Potassium: 4.1 mmol/L (ref 3.5–5.1)
Sodium: 140 mmol/L (ref 135–145)

## 2015-02-01 LAB — CBC
HCT: 34.3 % — ABNORMAL LOW (ref 35.0–47.0)
Hemoglobin: 10.8 g/dL — ABNORMAL LOW (ref 12.0–16.0)
MCH: 27.6 pg (ref 26.0–34.0)
MCHC: 31.6 g/dL — ABNORMAL LOW (ref 32.0–36.0)
MCV: 87.3 fL (ref 80.0–100.0)
Platelets: 222 10*3/uL (ref 150–440)
RBC: 3.93 MIL/uL (ref 3.80–5.20)
RDW: 17.6 % — ABNORMAL HIGH (ref 11.5–14.5)
WBC: 7.7 10*3/uL (ref 3.6–11.0)

## 2015-02-01 MED ORDER — NYSTATIN 100000 UNIT/ML MT SUSP
5.0000 mL | Freq: Four times a day (QID) | OROMUCOSAL | Status: DC
  Administered 2015-02-01 – 2015-02-08 (×27): 500000 [IU] via ORAL
  Filled 2015-02-01 (×27): qty 5

## 2015-02-01 MED ORDER — DOXYCYCLINE HYCLATE 100 MG PO TABS
100.0000 mg | ORAL_TABLET | Freq: Two times a day (BID) | ORAL | Status: DC
  Administered 2015-02-01 – 2015-02-02 (×2): 100 mg via ORAL
  Filled 2015-02-01 (×2): qty 1

## 2015-02-01 NOTE — Patient Outreach (Addendum)
Unable to contact letter was sent 11/18 after 3  Telephone attempts, no response.  However, view in Epic shows pt admitted to the hospital 11/24.   Inpatient Case management note- prior to hospitalization, pt followed in home by [facility] and plan is to resume services at discharge.      Plan to close case due to pt to resume home Hospice services.    Plan to inform Lattie [facility] care management assistant) to close case. Plan to send in basket  in Epic  closure letter to  [name].    Zara Chess.   [facility]  [phone]

## 2015-02-01 NOTE — Progress Notes (Signed)
Visit made. Patient is followed at home by [facility] with a diagnosis of COPD. She is a Full Code. She was admitted to [facility] on 11/24 for treatment of COPD exacerbation. Patient seen lying in bed, alert and oriented, converses easily. She remains on 2-3 liters of oxygen via nasal cannula. She reports she has had a "test" on her legs to determine if she has a blood clot d/t increased lower leg pain. She does take both lyrica and requip at home, which she reports "only helps a little". The pain is worse when her legs are "touched". She reports a good appetite, she is now ambulating to the bathroom per her report. She remains on IV antibiotics and IV steroids. No discharge date at this time. Will continue to follow and update hospice team. [facility] an CSW are both aware aware that patient is followed at home by [facility]. Flo Shanks RN, BSN, [facility] [place], [facility] [phone] c

## 2015-02-01 NOTE — Progress Notes (Signed)
Physical Therapy Treatment Patient Details Name: RUAH NOETH MRN: [id number] DOB: December 29, 1950 Today's Date: 02/01/2015    History of Present Illness Pt is a 64 y/o female that presents with increased shortness of breath, has recently been admitted for renal failure. On previous admissions she has been very limited with ambulation.    PT Comments    Pt agreeable to PT. Pt wishes to ambulate to the bathroom and agreeable to up in chair afterwards. Pt reports bilateral leg pain; received medicine during session. Pt continues to do well with short bouts of ambulation other than shortness of breath that is a little worse than baseline according to patient. Pt ambulates to chair after using the restroom. Pt states understanding of lower extremity exercises, which were reviewed with patient and encouraged to perform several times a day. Continue PT for progression of strength, endurance and improved ambulation distance and safety in regards to O2 tubing for improved functional mobility to allow safe return home.   Follow Up Recommendations  Home health PT     Equipment Recommendations       Recommendations for Other Services       Precautions / Restrictions Precautions Precautions: Fall Restrictions Weight Bearing Restrictions: No    Mobility  Bed Mobility Overal bed mobility: Modified Independent Bed Mobility: Supine to Sit     Supine to sit: Modified independent (Device/Increase time)     General bed mobility comments: Bed mobility with increased effort and use of rails, but sits on first attempt   Transfers Overall transfer level: Modified independent Equipment used: Rolling walker (2 wheeled) (Bariatric) Transfers: Sit to/from Stand Sit to Stand: Modified independent (Device/Increase time)         General transfer comment: STS without difficulty; notes mild SOB only a little worse than baseline with getting up. Safe hand placement and  steadiness.  Ambulation/Gait Ambulation/Gait assistance: Min guard;Supervision (Aware of tubing,but occasional assist to maneuver) Ambulation Distance (Feet): 15 Feet (first walk to the bathroom; then 21ft to chair) Assistive device: Rolling walker (2 wheeled) (bariatric) Gait Pattern/deviations: WFL(Within Functional Limits)   Gait velocity interpretation: Below normal speed for age/gender General Gait Details: Fatigues due to SOB quickly, but manages steps and bariatric rw wwell without LOB. Pt educated to pick up O2 tubing versus rolling over/stepping over it for safety.    Stairs            Wheelchair Mobility    Modified Rankin (Stroke Patients Only)       Balance                                    Cognition Arousal/Alertness: Awake/alert Behavior During Therapy: WFL for tasks assessed/performed Overall Cognitive Status: Within Functional Limits for tasks assessed                      Exercises Other Exercises Other Exercises: Reviewed basic chair exercises with pt; pt reports undestanding and compliance with leg exercises.     General Comments        Pertinent Vitals/Pain      Home Living                      Prior Function            PT Goals (current goals can now be found in the care plan section) Progress towards PT goals: Progressing toward goals  Frequency  Min 2X/week    PT Plan Current plan remains appropriate    Co-evaluation             End of Session   Activity Tolerance: Patient limited by fatigue Patient left: in chair;with call bell/phone within reach;with chair alarm set;with nursing/sitter in room     Time: 1433-1450 PT Time Calculation (min) (ACUTE ONLY): 17 min  Charges:  $Gait Training: 8-22 mins                    G Codes:      Charlaine Dalton 02/01/2015, 3:05 PM

## 2015-02-01 NOTE — Progress Notes (Signed)
Patient ID: Loretta Wade, female   DOB: 01/24/1951, 64 y.o.   MRN: [id number]  [facility] PROGRESS NOTE  PCP: Loretta Rake, MD  HPI/Subjective: Patient complains of some pain in her lower extremities. She does not know what the cause of that is. Still with some cough. She believes her breathing is better than when she came in.  Objective: Filed Vitals:   02/01/15 0833 02/01/15 1318  BP: 140/70 122/45  Pulse: 87 93  Temp: 97.9 F (36.6 C) 97.5 F (36.4 C)  Resp: 18 22    Filed Weights   01/28/15 0352 01/28/15 1033  Weight: 171 kg (376 lb 15.8 oz) 163.295 kg (360 lb)    ROS: Review of Systems  Constitutional: Negative for fever and chills.  HENT: Positive for nosebleeds.   Eyes: Negative for blurred vision.  Respiratory: Positive for cough, shortness of breath and wheezing.   Cardiovascular: Negative for chest pain.  Gastrointestinal: Positive for blood in stool. Negative for nausea, vomiting, abdominal pain, diarrhea and constipation.  Genitourinary: Negative for dysuria.  Musculoskeletal: Negative for joint pain.  Neurological: Negative for dizziness and headaches.   Exam: Physical Exam  Constitutional: She is oriented to person, place, and time.  HENT:  Nose: No mucosal edema.  Mouth/Throat: No oropharyngeal exudate or posterior oropharyngeal edema.  Eyes: Conjunctivae, EOM and lids are normal. Pupils are equal, round, and reactive to light.  Neck: No JVD present. Carotid bruit is not present. No edema present. No thyroid mass and no thyromegaly present.  Cardiovascular: S1 normal and S2 normal.  Exam reveals no gallop.   No murmur heard. Pulses:      Dorsalis pedis pulses are 2+ on the right side, and 2+ on the left side.  Respiratory: No respiratory distress. She has decreased breath sounds in the right lower field and the left lower field. She has wheezes in the right lower field and the left lower field. She has no rhonchi. She has no rales.   GI: Soft. Bowel sounds are normal. There is no tenderness.  Musculoskeletal:       Right ankle: She exhibits swelling.       Left ankle: She exhibits swelling.  Lymphadenopathy:    She has no cervical adenopathy.  Neurological: She is alert and oriented to person, place, and time. No cranial nerve deficit.  Skin: Skin is warm. No rash noted. Nails show no clubbing.  Psychiatric: She has a normal mood and affect.    Data Reviewed: Basic Metabolic Panel:  Recent Labs Lab 01/28/15 0430 01/29/15 0413 02/01/15 0419  NA 142 142 140  K 3.7 3.9 4.1  CL 103 103 102  CO2 34* 32 32  GLUCOSE 119* 161* 162*  BUN 6 10 23*  CREATININE 0.72 0.74 0.75  CALCIUM 9.1 9.7 9.0   Liver Function Tests:  Recent Labs Lab 01/28/15 0430  AST 17  ALT 13*  ALKPHOS 77  BILITOT 0.5  PROT 6.6  ALBUMIN 3.5   CBC:  Recent Labs Lab 01/28/15 0414 01/29/15 0413 02/01/15 0419  WBC 4.7 5.6 7.7  NEUTROABS 2.3  --   --   HGB 10.8* 10.8* 10.8*  HCT 35.1 34.2* 34.3*  MCV 87.9 87.5 87.3  PLT 248 239 222   Cardiac Enzymes:  Recent Labs Lab 01/28/15 0430  TROPONINI <0.03   BNP (last 3 results)  Recent Labs  07/08/14 1515 11/04/14 1154 01/28/15 0414  BNP 18.0 38.0 35.0     Recent Results (from the  past 240 hour(s))  MRSA PCR Screening     Status: None   Collection Time: 01/28/15 11:52 AM  Result Value Ref Range Status   MRSA by PCR NEGATIVE NEGATIVE Final    Comment:        The GeneXpert MRSA Assay (FDA approved for NASAL specimens only), is one component of a comprehensive MRSA colonization surveillance program. It is not intended to diagnose MRSA infection nor to guide or monitor treatment for MRSA infections.   Urine culture     Status: None   Collection Time: 01/28/15 12:39 PM  Result Value Ref Range Status   Specimen Description URINE, CLEAN CATCH  Final   Special Requests Normal  Final   Culture   Final    50,000 COLONIES/mL ESCHERICHIA COLI 30,000 COLONIES/mL  ENTEROCOCCUS FAECALIS    Report Status 01/31/2015 FINAL  Final   Organism ID, Bacteria ESCHERICHIA COLI  Final   Organism ID, Bacteria ENTEROCOCCUS FAECALIS  Final      Susceptibility   Escherichia coli - MIC*    AMPICILLIN >=32 RESISTANT Resistant     CEFAZOLIN <=4 SENSITIVE Sensitive     CEFTRIAXONE <=1 SENSITIVE Sensitive     CIPROFLOXACIN >=4 RESISTANT Resistant     GENTAMICIN 2 SENSITIVE Sensitive     IMIPENEM <=0.25 SENSITIVE Sensitive     NITROFURANTOIN <=16 SENSITIVE Sensitive     TRIMETH/SULFA >=320 RESISTANT Resistant     PIP/TAZO Value in next row Sensitive      SENSITIVE<=4    LEVOFLOXACIN Value in next row Resistant      RESISTANT>=8    * 50,000 COLONIES/mL ESCHERICHIA COLI   Enterococcus faecalis - MIC*    AMPICILLIN Value in next row Sensitive      RESISTANT>=8    NITROFURANTOIN Value in next row Sensitive      RESISTANT>=8    LINEZOLID Value in next row Sensitive      RESISTANT>=8    CIPROFLOXACIN Value in next row Resistant      RESISTANT>=8    LEVOFLOXACIN Value in next row Resistant      RESISTANT>=8    TETRACYCLINE Value in next row Resistant      RESISTANT>=16    * 30,000 COLONIES/mL ENTEROCOCCUS FAECALIS  Culture, expectorated sputum-assessment     Status: None   Collection Time: 01/29/15  9:49 AM  Result Value Ref Range Status   Specimen Description EXPECTORATED SPUTUM  Final   Special Requests Normal  Final   Sputum evaluation   Final    Sputum specimen not acceptable for testing.  Please recollect.   Results Called to: [facility] 01/29/2015 1528 BY JRS.    Report Status 01/29/2015 FINAL  Final  Culture, expectorated sputum-assessment     Status: None   Collection Time: 01/30/15 12:22 PM  Result Value Ref Range Status   Specimen Description EXPECTORATED SPUTUM  Final   Special Requests NONE  Final   Sputum evaluation THIS SPECIMEN IS ACCEPTABLE FOR SPUTUM CULTURE  Final   Report Status 01/30/2015 FINAL  Final  Culture, respiratory  (NON-Expectorated)     Status: None (Preliminary result)   Collection Time: 01/30/15 12:22 PM  Result Value Ref Range Status   Specimen Description EXPECTORATED SPUTUM  Final   Special Requests NONE Reflexed from [id number]  Final   Gram Stain   Final    FEW WBC SEEN FEW GRAM POSITIVE COCCI IN CLUSTERS FEW YEAST GOOD SPECIMEN - 80-90% WBCS    Culture   Final  LIGHT GROWTH STAPHYLOCOCCUS AUREUS SUSCEPTIBILITIES TO FOLLOW    Report Status PENDING  Incomplete     Studies: US Venous Img Lower Bilateral  02/01/2015  CLINICAL DATA:  Bilateral lower extremity pain for 5 days. EXAM: BILATERAL LOWER EXTREMITY VENOUS DOPPLER ULTRASOUND TECHNIQUE: Gray-scale sonography with graded compression, as well as color Doppler and duplex ultrasound were performed to evaluate the lower extremity deep venous systems from the level of the common femoral vein and including the common femoral, femoral, profunda femoral, popliteal and calf veins including the posterior tibial, peroneal and gastrocnemius veins when visible. The superficial great saphenous vein was also interrogated. Spectral Doppler was utilized to evaluate flow at rest and with distal augmentation maneuvers in the common femoral, femoral and popliteal veins. COMPARISON:  Ultrasound of November 14, 2014. FINDINGS: RIGHT LOWER EXTREMITY Common Femoral Vein: No evidence of thrombus. Normal compressibility, respiratory phasicity and response to augmentation. Saphenofemoral Junction: No evidence of thrombus. Normal compressibility and flow on color Doppler imaging. Profunda Femoral Vein: No evidence of thrombus. Normal compressibility and flow on color Doppler imaging. Femoral Vein: No evidence of thrombus. Normal compressibility, respiratory phasicity and response to augmentation. Popliteal Vein: No evidence of thrombus. Normal compressibility, respiratory phasicity and response to augmentation. Calf Veins: No evidence of thrombus. Normal compressibility and  flow on color Doppler imaging. Superficial Great Saphenous Vein: No evidence of thrombus. Normal compressibility and flow on color Doppler imaging. Venous Reflux:  None. Other Findings:  None. LEFT LOWER EXTREMITY Common Femoral Vein: No evidence of thrombus. Normal compressibility, respiratory phasicity and response to augmentation. Saphenofemoral Junction: No evidence of thrombus. Normal compressibility and flow on color Doppler imaging. Profunda Femoral Vein: No evidence of thrombus. Normal compressibility and flow on color Doppler imaging. Femoral Vein: No evidence of thrombus. Normal compressibility, respiratory phasicity and response to augmentation. Popliteal Vein: No evidence of thrombus. Normal compressibility, respiratory phasicity and response to augmentation. Calf Veins: No evidence of thrombus. Normal compressibility and flow on color Doppler imaging. Superficial Great Saphenous Vein: No evidence of thrombus. Normal compressibility and flow on color Doppler imaging. Venous Reflux:  None. Other Findings:  None. IMPRESSION: No evidence of deep venous thrombosis seen in either lower extremity. Electronically Signed   By: Marijo Conception, M.D.   On: 02/01/2015 10:37    Scheduled Meds: . albuterol  2.5 mg Nebulization TID  . azithromycin  250 mg Oral Daily  . budesonide (PULMICORT) nebulizer solution  0.25 mg Nebulization BID  . cefTRIAXone (ROCEPHIN)  IV  1 g Intravenous Q24H  . cholecalciferol  1,000 Units Oral Daily  . docusate sodium  100 mg Oral BID  . furosemide  40 mg Oral Daily  . magnesium oxide  400 mg Oral Daily  . methylPREDNISolone (SOLU-MEDROL) injection  40 mg Intravenous Q6H  . morphine  15 mg Oral Q12H  . morphine  30 mg Oral Q12H  . nystatin  5 mL Oral QID  . oxybutynin  5 mg Oral Daily  . pantoprazole  40 mg Oral BID  . pregabalin  150 mg Oral BID  . rOPINIRole  0.75 mg Oral QHS  . senna-docusate  2 tablet Oral Daily  . sodium chloride  3 mL Intravenous Q12H     Assessment/Plan:  1. COPD exacerbation, chronic respiratory failure on 2 L of oxygen. Try to decrease Solu-Medrol 40 mg IV every 6 hours. Continue nebulizer treatments and budesonide nebulizers. Continue antibiotics Rocephin and change Zithromax over to doxycycline with staph aureus in the sputum culture 2.  Gastroesophageal reflux disease without esophagitis continue PPI 3. Essential hypertension- continue usual medications 4. Fibromyalgia and chronic pain- continue chronic pain medications 5. Lower extremity edema continue increased dose of Lasix 40 mg daily 6. Headache- dose of Toradol and magnesium 7. Acute cystitis without hematuria- urine culture only growing out 50,000 Escherichia coli. Rocephin. 8. Epistaxis and blood in the stool- hemoglobin stable  Code Status:     Code Status Orders        Start     Ordered   01/28/15 0808  Full code   Continuous     01/28/15 0808     Disposition Plan: Home once breathing better  Antibiotics:  Rocephin and doxycycline  Time spent: 22 minutes  Loletha Grayer  [facility]

## 2015-02-02 ENCOUNTER — Encounter

## 2015-02-02 MED ORDER — METHYLPREDNISOLONE SODIUM SUCC 125 MG IJ SOLR
60.0000 mg | Freq: Four times a day (QID) | INTRAMUSCULAR | Status: DC
  Administered 2015-02-02 – 2015-02-03 (×3): 60 mg via INTRAVENOUS
  Filled 2015-02-02 (×3): qty 2

## 2015-02-02 MED ORDER — LINEZOLID 600 MG/300ML IV SOLN
600.0000 mg | Freq: Two times a day (BID) | INTRAVENOUS | Status: DC
  Administered 2015-02-02 – 2015-02-03 (×4): 600 mg via INTRAVENOUS
  Filled 2015-02-02 (×6): qty 300

## 2015-02-02 NOTE — Progress Notes (Signed)
Physical Therapy Treatment Patient Details Name: Loretta Wade MRN: [id number] DOB: Jul 17, 1950 Today's Date: 02/02/2015    History of Present Illness Pt is a 64 y/o female that presents with increased shortness of breath, has recently been admitted for renal failure. On previous admissions she has been very limited with ambulation.    PT Comments    Pt up in chair; complains of bilateral lower extremity pain (8/10) that comes and goes; currently painful. Session held to exercises in chair today with written handout provided. Pt does require rest breaks between exercises. O2 saturation remains 95% on 2L throughout treatment. It is noted that although slow and pt becomes short of breath with ambulation; pt is able to ambulate to from the bathroom with supervision. Continue PT for progression of strength, endurance and ambulation distance to improve functional mobility and allow for safe return home.   Follow Up Recommendations  Home health PT     Equipment Recommendations       Recommendations for Other Services       Precautions / Restrictions Precautions Precautions: Fall Restrictions Weight Bearing Restrictions: No    Mobility  Bed Mobility               General bed mobility comments: Not tested; up in chair  Transfers                    Ambulation/Gait                 Stairs            Wheelchair Mobility    Modified Rankin (Stroke Patients Only)       Balance                                    Cognition Arousal/Alertness: Awake/alert Behavior During Therapy: WFL for tasks assessed/performed Overall Cognitive Status: Within Functional Limits for tasks assessed                      Exercises General Exercises - Lower Extremity Ankle Circles/Pumps: AROM;Both;20 reps (long sit) Quad Sets: Strengthening;Both;20 reps (long sit) Gluteal Sets: Strengthening;Both;20 reps (long sit) Short Arc Quad:  AROM;Both;20 reps (long sit) Heel Slides: AROM;Both;20 reps (long sit) Hip ABduction/ADduction: AROM;Both;20 reps (long sit) Other Exercises Other Exercises: Adductor squeeze on pillow 20x 5 sec hold Other Exercises: Requires rest breaks between exercises    General Comments        Pertinent Vitals/Pain Pain Assessment: 0-10 Pain Score: 8  Pain Location: BLEs  Pain Descriptors / Indicators: Other (Comment);Throbbing;Burning (comes and goes) Pain Intervention(s): Limited activity within patient's tolerance;Premedicated before session (pt notes starting a new medication)    Home Living                      Prior Function            PT Goals (current goals can now be found in the care plan section) Progress towards PT goals: Progressing toward goals    Frequency  Min 2X/week    PT Plan Current plan remains appropriate    Co-evaluation             End of Session Equipment Utilized During Treatment: Oxygen Activity Tolerance: Patient tolerated treatment well;No increased pain Patient left: in chair;with call bell/phone within reach;with chair alarm set     Time: [id number] PT  Time Calculation (min) (ACUTE ONLY): 24 min  Charges:  $Therapeutic Exercise: 23-37 mins                    G Codes:      Charlaine Dalton 02/02/2015, 10:43 AM

## 2015-02-02 NOTE — Progress Notes (Signed)
Visit made. Patient seen sitting up in the recliner, appears too short of breath to talk. Staff RN April present and reports patient had just walked across the room from the bathroom. Patient reports she did not sleep well last night d/t her "breathing and leg pain". She is currently receiving 45 mg of MS contin q 12 hrs with Vicodin 5-325mg  1-2 tablets every  4 hours PRN for break through pain. She continues on IV antibiotics and IV steroids, per conversation with attending Dr. Leslye Peer, sputum culture positive for MRSA, no discharge planned at this time. Will continue to follow and update hospice team.  Flo Shanks RN, BSN, [facility] Liaison [phone] c

## 2015-02-02 NOTE — Progress Notes (Signed)
Patient ID: Loretta Wade, female   DOB: Jun 21, 1950, 64 y.o.   MRN: [id number]  [facility] PROGRESS NOTE  PCP: Keith Rake, MD  HPI/Subjective: Patient feeling worse, with cough and shortness of breath and chest pain.  Wheezing again. Lab just called positive MRSA in the sputum.  Objective: Filed Vitals:   02/02/15 0509 02/02/15 0801  BP: 129/64 129/68  Pulse: 58 68  Temp: 97.8 F (36.6 C) 97.6 F (36.4 C)  Resp: 22 22    Filed Weights   01/28/15 0352 01/28/15 1033  Weight: 171 kg (376 lb 15.8 oz) 163.295 kg (360 lb)    ROS: Review of Systems  Constitutional: Negative for fever and chills.  HENT: Positive for nosebleeds.   Eyes: Negative for blurred vision.  Respiratory: Positive for cough, shortness of breath and wheezing.   Cardiovascular: Negative for chest pain.  Gastrointestinal: Positive for blood in stool. Negative for nausea, vomiting, abdominal pain, diarrhea and constipation.  Genitourinary: Negative for dysuria.  Musculoskeletal: Positive for myalgias. Negative for joint pain.  Neurological: Negative for dizziness and headaches.   Exam: Physical Exam  Constitutional: She is oriented to person, place, and time.  HENT:  Nose: No mucosal edema.  Mouth/Throat: No oropharyngeal exudate or posterior oropharyngeal edema.  Eyes: Conjunctivae, EOM and lids are normal. Pupils are equal, round, and reactive to light.  Neck: No JVD present. Carotid bruit is not present. No edema present. No thyroid mass and no thyromegaly present.  Cardiovascular: S1 normal and S2 normal.  Exam reveals no gallop.   No murmur heard. Pulses:      Dorsalis pedis pulses are 2+ on the right side, and 2+ on the left side.  Respiratory: Accessory muscle usage present. No respiratory distress. She has decreased breath sounds in the right middle field, the right lower field, the left middle field and the left lower field. She has wheezes in the right middle field, the right  lower field, the left middle field and the left lower field. She has no rhonchi. She has no rales.  GI: Soft. Bowel sounds are normal. There is no tenderness.  Musculoskeletal:       Right ankle: She exhibits swelling.       Left ankle: She exhibits swelling.  Lymphadenopathy:    She has no cervical adenopathy.  Neurological: She is alert and oriented to person, place, and time. No cranial nerve deficit.  Skin: Skin is warm. No rash noted. Nails show no clubbing.  Psychiatric: She has a normal mood and affect.    Data Reviewed: Basic Metabolic Panel:  Recent Labs Lab 01/28/15 0430 01/29/15 0413 02/01/15 0419  NA 142 142 140  K 3.7 3.9 4.1  CL 103 103 102  CO2 34* 32 32  GLUCOSE 119* 161* 162*  BUN 6 10 23*  CREATININE 0.72 0.74 0.75  CALCIUM 9.1 9.7 9.0   Liver Function Tests:  Recent Labs Lab 01/28/15 0430  AST 17  ALT 13*  ALKPHOS 77  BILITOT 0.5  PROT 6.6  ALBUMIN 3.5   CBC:  Recent Labs Lab 01/28/15 0414 01/29/15 0413 02/01/15 0419  WBC 4.7 5.6 7.7  NEUTROABS 2.3  --   --   HGB 10.8* 10.8* 10.8*  HCT 35.1 34.2* 34.3*  MCV 87.9 87.5 87.3  PLT 248 239 222   Cardiac Enzymes:  Recent Labs Lab 01/28/15 0430  TROPONINI <0.03   BNP (last 3 results)  Recent Labs  07/08/14 1515 11/04/14 1154 01/28/15 0414  BNP 18.0 38.0 35.0     Recent Results (from the past 240 hour(s))  MRSA PCR Screening     Status: None   Collection Time: 01/28/15 11:52 AM  Result Value Ref Range Status   MRSA by PCR NEGATIVE NEGATIVE Final    Comment:        The GeneXpert MRSA Assay (FDA approved for NASAL specimens only), is one component of a comprehensive MRSA colonization surveillance program. It is not intended to diagnose MRSA infection nor to guide or monitor treatment for MRSA infections.   Urine culture     Status: None   Collection Time: 01/28/15 12:39 PM  Result Value Ref Range Status   Specimen Description URINE, CLEAN CATCH  Final   Special  Requests Normal  Final   Culture   Final    50,000 COLONIES/mL ESCHERICHIA COLI 30,000 COLONIES/mL ENTEROCOCCUS FAECALIS    Report Status 01/31/2015 FINAL  Final   Organism ID, Bacteria ESCHERICHIA COLI  Final   Organism ID, Bacteria ENTEROCOCCUS FAECALIS  Final      Susceptibility   Escherichia coli - MIC*    AMPICILLIN >=32 RESISTANT Resistant     CEFAZOLIN <=4 SENSITIVE Sensitive     CEFTRIAXONE <=1 SENSITIVE Sensitive     CIPROFLOXACIN >=4 RESISTANT Resistant     GENTAMICIN 2 SENSITIVE Sensitive     IMIPENEM <=0.25 SENSITIVE Sensitive     NITROFURANTOIN <=16 SENSITIVE Sensitive     TRIMETH/SULFA >=320 RESISTANT Resistant     PIP/TAZO Value in next row Sensitive      SENSITIVE<=4    LEVOFLOXACIN Value in next row Resistant      RESISTANT>=8    * 50,000 COLONIES/mL ESCHERICHIA COLI   Enterococcus faecalis - MIC*    AMPICILLIN Value in next row Sensitive      RESISTANT>=8    NITROFURANTOIN Value in next row Sensitive      RESISTANT>=8    LINEZOLID Value in next row Sensitive      RESISTANT>=8    CIPROFLOXACIN Value in next row Resistant      RESISTANT>=8    LEVOFLOXACIN Value in next row Resistant      RESISTANT>=8    TETRACYCLINE Value in next row Resistant      RESISTANT>=16    * 30,000 COLONIES/mL ENTEROCOCCUS FAECALIS  Culture, expectorated sputum-assessment     Status: None   Collection Time: 01/29/15  9:49 AM  Result Value Ref Range Status   Specimen Description EXPECTORATED SPUTUM  Final   Special Requests Normal  Final   Sputum evaluation   Final    Sputum specimen not acceptable for testing.  Please recollect.   Results Called to: KIM [facility] 01/29/2015 1528 BY JRS.    Report Status 01/29/2015 FINAL  Final  Culture, expectorated sputum-assessment     Status: None   Collection Time: 01/30/15 12:22 PM  Result Value Ref Range Status   Specimen Description EXPECTORATED SPUTUM  Final   Special Requests NONE  Final   Sputum evaluation THIS SPECIMEN IS  ACCEPTABLE FOR SPUTUM CULTURE  Final   Report Status 01/30/2015 FINAL  Final  Culture, respiratory (NON-Expectorated)     Status: None (Preliminary result)   Collection Time: 01/30/15 12:22 PM  Result Value Ref Range Status   Specimen Description EXPECTORATED SPUTUM  Final   Special Requests NONE Reflexed from [id number]  Final   Gram Stain   Final    FEW WBC SEEN FEW GRAM POSITIVE COCCI IN CLUSTERS FEW YEAST GOOD SPECIMEN -  80-90% WBCS    Culture   Final    LIGHT GROWTH STAPHYLOCOCCUS AUREUS SUSCEPTIBILITIES TO FOLLOW    Report Status PENDING  Incomplete     Studies: US Venous Img Lower Bilateral  02/01/2015  CLINICAL DATA:  Bilateral lower extremity pain for 5 days. EXAM: BILATERAL LOWER EXTREMITY VENOUS DOPPLER ULTRASOUND TECHNIQUE: Gray-scale sonography with graded compression, as well as color Doppler and duplex ultrasound were performed to evaluate the lower extremity deep venous systems from the level of the common femoral vein and including the common femoral, femoral, profunda femoral, popliteal and calf veins including the posterior tibial, peroneal and gastrocnemius veins when visible. The superficial great saphenous vein was also interrogated. Spectral Doppler was utilized to evaluate flow at rest and with distal augmentation maneuvers in the common femoral, femoral and popliteal veins. COMPARISON:  Ultrasound of November 14, 2014. FINDINGS: RIGHT LOWER EXTREMITY Common Femoral Vein: No evidence of thrombus. Normal compressibility, respiratory phasicity and response to augmentation. Saphenofemoral Junction: No evidence of thrombus. Normal compressibility and flow on color Doppler imaging. Profunda Femoral Vein: No evidence of thrombus. Normal compressibility and flow on color Doppler imaging. Femoral Vein: No evidence of thrombus. Normal compressibility, respiratory phasicity and response to augmentation. Popliteal Vein: No evidence of thrombus. Normal compressibility, respiratory  phasicity and response to augmentation. Calf Veins: No evidence of thrombus. Normal compressibility and flow on color Doppler imaging. Superficial Great Saphenous Vein: No evidence of thrombus. Normal compressibility and flow on color Doppler imaging. Venous Reflux:  None. Other Findings:  None. LEFT LOWER EXTREMITY Common Femoral Vein: No evidence of thrombus. Normal compressibility, respiratory phasicity and response to augmentation. Saphenofemoral Junction: No evidence of thrombus. Normal compressibility and flow on color Doppler imaging. Profunda Femoral Vein: No evidence of thrombus. Normal compressibility and flow on color Doppler imaging. Femoral Vein: No evidence of thrombus. Normal compressibility, respiratory phasicity and response to augmentation. Popliteal Vein: No evidence of thrombus. Normal compressibility, respiratory phasicity and response to augmentation. Calf Veins: No evidence of thrombus. Normal compressibility and flow on color Doppler imaging. Superficial Great Saphenous Vein: No evidence of thrombus. Normal compressibility and flow on color Doppler imaging. Venous Reflux:  None. Other Findings:  None. IMPRESSION: No evidence of deep venous thrombosis seen in either lower extremity. Electronically Signed   By: Marijo Conception, M.D.   On: 02/01/2015 10:37    Scheduled Meds: . albuterol  2.5 mg Nebulization TID  . budesonide (PULMICORT) nebulizer solution  0.25 mg Nebulization BID  . cefTRIAXone (ROCEPHIN)  IV  1 g Intravenous Q24H  . cholecalciferol  1,000 Units Oral Daily  . docusate sodium  100 mg Oral BID  . doxycycline  100 mg Oral Q12H  . furosemide  40 mg Oral Daily  . magnesium oxide  400 mg Oral Daily  . methylPREDNISolone (SOLU-MEDROL) injection  60 mg Intravenous Q6H  . morphine  15 mg Oral Q12H  . morphine  30 mg Oral Q12H  . nystatin  5 mL Oral QID  . oxybutynin  5 mg Oral Daily  . pantoprazole  40 mg Oral BID  . pregabalin  150 mg Oral BID  . rOPINIRole  0.75 mg  Oral QHS  . senna-docusate  2 tablet Oral Daily  . sodium chloride  3 mL Intravenous Q12H    Assessment/Plan:  1. COPD exacerbation, chronic respiratory failure on 2 L of oxygen.  Increase Solu-Medrol 60 mg IV every 6 hours.  MRSA in the sputum-  I would like to avoid vancomycin  and use Zyvox and said secondary to obesity. Continue nebulizer treatments and budesonide nebulizers. Continue antibiotics Rocephin. 2. Gastroesophageal reflux disease without esophagitis continue PPI 3. Essential hypertension- continue usual medications 4. Fibromyalgia and chronic pain- continue chronic pain medications 5. Lower extremity edema continue increased dose of Lasix 40 mg daily 6. Headache- 7. Acute cystitis without hematuria- urine culture only growing out 50,000 Escherichia coli. Rocephin. 8. Epistaxis and blood in the stool- hemoglobin stable  Code Status:     Code Status Orders        Start     Ordered   01/28/15 0808  Full code   Continuous     01/28/15 0808     Disposition Plan: Home once breathing better.  Patient follows with hospice at home.  Antibiotics:  Rocephin and  Zyvox  Time spent: 22 minutes  Loletha Grayer  [facility]

## 2015-02-02 NOTE — Progress Notes (Signed)
Dr. Leslye Peer notified that lab notified nursing with a reading of slight growth of MRSA in sputum.

## 2015-02-03 LAB — CULTURE, RESPIRATORY W GRAM STAIN

## 2015-02-03 LAB — CULTURE, RESPIRATORY

## 2015-02-03 MED ORDER — METHYLPREDNISOLONE SODIUM SUCC 40 MG IJ SOLR
40.0000 mg | Freq: Four times a day (QID) | INTRAMUSCULAR | Status: DC
  Administered 2015-02-03 – 2015-02-04 (×5): 40 mg via INTRAVENOUS
  Filled 2015-02-03 (×5): qty 1

## 2015-02-03 NOTE — Progress Notes (Signed)
PT Cancellation Note  Patient Details Name: SHAVONTE HENRICKS MRN: [id number] DOB: 1950/10/27   Cancelled Treatment:    Reason Eval/Treat Not Completed: Patient declined, no reason specified. Treatment attempted earlier this morning; pt refuses at this time without specific reason other than she is not "up to it" right now. Pt requests that PT check back later. Will re attempt as the schedule allows.    Erline Levine Bishop 02/03/2015, 10:49 AM

## 2015-02-03 NOTE — Progress Notes (Signed)
Visit made. Patient seen lying in bed, alert, reading the paper. Now on contact precautions for MRSA in her sputum. IV antibiotics changed to Zyvox.  She remains on IV steroids, she did not appear short of breath with conversation today. She has received 3 doses of Vicodin in the 18 hours. She continues to complain of pain "thumping" in her feet and knees, especially her right side. Right foot sensitive to touch. She currently has a wrap on her lower legs of elastic guaze, this was discussed with both the patient and staff RN Ana. Patient stated it was for her "swelling", put on at home by her hospice RN. It does not appear to be [employer]. Staff RN Ana to remove. Patient has declined physical therapy twice today, she reported some dizziness when up to the bathroom. Dinner tray in at end of visit, patient able to sit her self up on the side of the bed. Patient appreciative of visit. Will continue to follow and update hospice team. Flo Shanks RN, BSN, [place] [facility] [place], [facility] [phone]

## 2015-02-03 NOTE — Progress Notes (Signed)
Patient ID: Loretta Wade, female   DOB: [birth year], 64 y.o.   MRN: [id number]  [facility] PROGRESS NOTE  PCP: Keith Rake, MD  HPI/Subjective: Patient feeling a little bit better than yesterday. Still with cough and shortness of breath. Still with wheezing.  Objective: Filed Vitals:   02/03/15 0715 02/03/15 1057  BP: 138/70 142/68  Pulse: 70 91  Temp: 96.4 F (35.8 C) 96 F (35.6 C)  Resp: 20 20    Filed Weights   01/28/15 0352 01/28/15 1033  Weight: 171 kg (376 lb 15.8 oz) 163.295 kg (360 lb)    ROS: Review of Systems  Constitutional: Negative for fever and chills.  HENT: Positive for nosebleeds.   Eyes: Negative for blurred vision.  Respiratory: Positive for cough, shortness of breath and wheezing.   Cardiovascular: Negative for chest pain.  Gastrointestinal: Negative for nausea, vomiting, abdominal pain, diarrhea, constipation and blood in stool.  Genitourinary: Negative for dysuria.  Musculoskeletal: Positive for myalgias. Negative for joint pain.  Neurological: Negative for dizziness and headaches.   Exam: Physical Exam  Constitutional: She is oriented to person, place, and time.  HENT:  Nose: No mucosal edema.  Mouth/Throat: No oropharyngeal exudate or posterior oropharyngeal edema.  Eyes: Conjunctivae, EOM and lids are normal. Pupils are equal, round, and reactive to light.  Neck: No JVD present. Carotid bruit is not present. No edema present. No thyroid mass and no thyromegaly present.  Cardiovascular: S1 normal and S2 normal.  Exam reveals no gallop.   No murmur heard. Pulses:      Dorsalis pedis pulses are 2+ on the right side, and 2+ on the left side.  Respiratory: No accessory muscle usage. No respiratory distress. She has decreased breath sounds in the right lower field and the left lower field. She has wheezes in the right lower field, the left middle field and the left lower field. She has no rhonchi. She has no rales.  GI: Soft.  Bowel sounds are normal. There is no tenderness.  Musculoskeletal:       Right ankle: She exhibits swelling.       Left ankle: She exhibits swelling.  Lymphadenopathy:    She has no cervical adenopathy.  Neurological: She is alert and oriented to person, place, and time. No cranial nerve deficit.  Skin: Skin is warm. No rash noted. Nails show no clubbing.  Psychiatric: She has a normal mood and affect.    Data Reviewed: Basic Metabolic Panel:  Recent Labs Lab 01/28/15 0430 01/29/15 0413 02/01/15 0419  NA 142 142 140  K 3.7 3.9 4.1  CL 103 103 102  CO2 34* 32 32  GLUCOSE 119* 161* 162*  BUN 6 10 23*  CREATININE 0.72 0.74 0.75  CALCIUM 9.1 9.7 9.0   Liver Function Tests:  Recent Labs Lab 01/28/15 0430  AST 17  ALT 13*  ALKPHOS 77  BILITOT 0.5  PROT 6.6  ALBUMIN 3.5   CBC:  Recent Labs Lab 01/28/15 0414 01/29/15 0413 02/01/15 0419  WBC 4.7 5.6 7.7  NEUTROABS 2.3  --   --   HGB 10.8* 10.8* 10.8*  HCT 35.1 34.2* 34.3*  MCV 87.9 87.5 87.3  PLT 248 239 222   Cardiac Enzymes:  Recent Labs Lab 01/28/15 0430  TROPONINI <0.03   BNP (last 3 results)  Recent Labs  07/08/14 1515 11/04/14 1154 01/28/15 0414  BNP 18.0 38.0 35.0     Recent Results (from the past 240 hour(s))  MRSA PCR Screening  Status: None   Collection Time: 01/28/15 11:52 AM  Result Value Ref Range Status   MRSA by PCR NEGATIVE NEGATIVE Final    Comment:        The GeneXpert MRSA Assay (FDA approved for NASAL specimens only), is one component of a comprehensive MRSA colonization surveillance program. It is not intended to diagnose MRSA infection nor to guide or monitor treatment for MRSA infections.   Urine culture     Status: None   Collection Time: 01/28/15 12:39 PM  Result Value Ref Range Status   Specimen Description URINE, CLEAN CATCH  Final   Special Requests Normal  Final   Culture   Final    50,000 COLONIES/mL ESCHERICHIA COLI 30,000 COLONIES/mL ENTEROCOCCUS  FAECALIS    Report Status 01/31/2015 FINAL  Final   Organism ID, Bacteria ESCHERICHIA COLI  Final   Organism ID, Bacteria ENTEROCOCCUS FAECALIS  Final      Susceptibility   Escherichia coli - MIC*    AMPICILLIN >=32 RESISTANT Resistant     CEFAZOLIN <=4 SENSITIVE Sensitive     CEFTRIAXONE <=1 SENSITIVE Sensitive     CIPROFLOXACIN >=4 RESISTANT Resistant     GENTAMICIN 2 SENSITIVE Sensitive     IMIPENEM <=0.25 SENSITIVE Sensitive     NITROFURANTOIN <=16 SENSITIVE Sensitive     TRIMETH/SULFA >=320 RESISTANT Resistant     PIP/TAZO Value in next row Sensitive      SENSITIVE<=4    LEVOFLOXACIN Value in next row Resistant      RESISTANT>=8    * 50,000 COLONIES/mL ESCHERICHIA COLI   Enterococcus faecalis - MIC*    AMPICILLIN Value in next row Sensitive      RESISTANT>=8    NITROFURANTOIN Value in next row Sensitive      RESISTANT>=8    LINEZOLID Value in next row Sensitive      RESISTANT>=8    CIPROFLOXACIN Value in next row Resistant      RESISTANT>=8    LEVOFLOXACIN Value in next row Resistant      RESISTANT>=8    TETRACYCLINE Value in next row Resistant      RESISTANT>=16    * 30,000 COLONIES/mL ENTEROCOCCUS FAECALIS  Culture, expectorated sputum-assessment     Status: None   Collection Time: 01/29/15  9:49 AM  Result Value Ref Range Status   Specimen Description EXPECTORATED SPUTUM  Final   Special Requests Normal  Final   Sputum evaluation   Final    Sputum specimen not acceptable for testing.  Please recollect.   Results Called to: KIM [facility] 01/29/2015 1528 BY JRS.    Report Status 01/29/2015 FINAL  Final  Culture, expectorated sputum-assessment     Status: None   Collection Time: 01/30/15 12:22 PM  Result Value Ref Range Status   Specimen Description EXPECTORATED SPUTUM  Final   Special Requests NONE  Final   Sputum evaluation THIS SPECIMEN IS ACCEPTABLE FOR SPUTUM CULTURE  Final   Report Status 01/30/2015 FINAL  Final  Culture, respiratory (NON-Expectorated)      Status: None   Collection Time: 01/30/15 12:22 PM  Result Value Ref Range Status   Specimen Description EXPECTORATED SPUTUM  Final   Special Requests NONE Reflexed from [id number]  Final   Gram Stain   Final    FEW WBC SEEN FEW GRAM POSITIVE COCCI IN CLUSTERS FEW YEAST GOOD SPECIMEN - 80-90% WBCS    Culture   Final    LIGHT GROWTH METHICILLIN RESISTANT STAPHYLOCOCCUS AUREUS CRITICAL RESULT CALLED TO, READ BACK  BY AND VERIFIED WITH: APRIL RAST AT 1010 ON 02/02/15  CTJ    Report Status 02/03/2015 FINAL  Final   Organism ID, Bacteria METHICILLIN RESISTANT STAPHYLOCOCCUS AUREUS  Final      Susceptibility   Methicillin resistant staphylococcus aureus - MIC*    CIPROFLOXACIN >=8 RESISTANT Resistant     GENTAMICIN <=0.5 SENSITIVE Sensitive     OXACILLIN >=4 RESISTANT Resistant     VANCOMYCIN 1 SENSITIVE Sensitive     TRIMETH/SULFA <=10 SENSITIVE Sensitive     CEFOXITIN SCREEN Value in next row Resistant      POSITIVECEFOXITIN SCREEN - This test may be used to predict mecA-mediated oxacillin resistance, and it is based on the cefoxitin disk screen test.  The cefoxitin screen and oxacillin work in combination to determine the final interpretation reported for oxacillin.     Inducible Clindamycin Value in next row Sensitive      POSITIVECEFOXITIN SCREEN - This test may be used to predict mecA-mediated oxacillin resistance, and it is based on the cefoxitin disk screen test.  The cefoxitin screen and oxacillin work in combination to determine the final interpretation reported for oxacillin.     ERYTHROMYCIN Value in next row Resistant      RESISTANT>=8    TETRACYCLINE Value in next row Sensitive      SENSITIVE<=1    CLINDAMYCIN Value in next row Resistant      RESISTANT>=8    LINEZOLID Value in next row Sensitive      SENSITIVE2    * LIGHT GROWTH METHICILLIN RESISTANT STAPHYLOCOCCUS AUREUS      Scheduled Meds: . albuterol  2.5 mg Nebulization TID  . budesonide (PULMICORT) nebulizer solution   0.25 mg Nebulization BID  . cefTRIAXone (ROCEPHIN)  IV  1 g Intravenous Q24H  . cholecalciferol  1,000 Units Oral Daily  . docusate sodium  100 mg Oral BID  . furosemide  40 mg Oral Daily  . linezolid (ZYVOX) IV  600 mg Intravenous Q12H  . magnesium oxide  400 mg Oral Daily  . methylPREDNISolone (SOLU-MEDROL) injection  40 mg Intravenous Q6H  . morphine  15 mg Oral Q12H  . morphine  30 mg Oral Q12H  . nystatin  5 mL Oral QID  . oxybutynin  5 mg Oral Daily  . pantoprazole  40 mg Oral BID  . pregabalin  150 mg Oral BID  . rOPINIRole  0.75 mg Oral QHS  . senna-docusate  2 tablet Oral Daily  . sodium chloride  3 mL Intravenous Q12H    Assessment/Plan:  1. COPD exacerbation, chronic respiratory failure on 2 L of oxygen.  Try to decrease Solu-Medrol 40 mg IV every 6 hours.  MRSA in the sputum-  Zyvox. Continue nebulizer treatments and budesonide nebulizers. Likely will switch over to Bactrim upon discharge home. 2. Gastroesophageal reflux disease without esophagitis continue PPI 3. Essential hypertension- continue usual medications 4. Fibromyalgia and chronic pain- continue chronic pain medications 5. Lower extremity edema continue increased dose of Lasix 40 mg daily 6. Headache- 7. Acute cystitis without hematuria- urine culture is not growing 100,000 colonies so we will not need to treat. 8. Epistaxis and blood in the stool- hemoglobin stable  Code Status:     Code Status Orders        Start     Ordered   01/28/15 0808  Full code   Continuous     01/28/15 0808     Disposition Plan: Home once breathing better.  Patient follows with hospice  at home.  Antibiotics:  Zyvox  Time spent: 20 minutes  Loletha Grayer  [facility] Hospitalists

## 2015-02-03 NOTE — Progress Notes (Signed)
Initial Nutrition Assessment      INTERVENTION:  Meals and snacks: Cater to pt preferences    NUTRITION DIAGNOSIS:    (none at this time) related to   as evidenced by  .    GOAL:   Patient will meet greater than or equal to 90% of their needs    MONITOR:    (Energy intake, Pulmonary profile)  REASON FOR ASSESSMENT:   LOS    ASSESSMENT:      Pt admitted with COPD exacerbation, MRSA in sputum  Past Medical History  Diagnosis Date  . OA (osteoarthritis)   . Fibromyalgia   . Anemia   . Sleep apnea   . Fatigue   . Edema   . Esophageal reflux   . Nocturia   . Hypertension   . COPD (chronic obstructive pulmonary disease) (HCC)     Current Nutrition: 83% of meals consumed per I and O sheet during admission  Food/Nutrition-Related History: normal intake prior to admission   Scheduled Medications:  . albuterol  2.5 mg Nebulization TID  . budesonide (PULMICORT) nebulizer solution  0.25 mg Nebulization BID  . cefTRIAXone (ROCEPHIN)  IV  1 g Intravenous Q24H  . cholecalciferol  1,000 Units Oral Daily  . docusate sodium  100 mg Oral BID  . furosemide  40 mg Oral Daily  . linezolid (ZYVOX) IV  600 mg Intravenous Q12H  . magnesium oxide  400 mg Oral Daily  . methylPREDNISolone (SOLU-MEDROL) injection  40 mg Intravenous Q6H  . morphine  15 mg Oral Q12H  . morphine  30 mg Oral Q12H  . nystatin  5 mL Oral QID  . oxybutynin  5 mg Oral Daily  . pantoprazole  40 mg Oral BID  . pregabalin  150 mg Oral BID  . rOPINIRole  0.75 mg Oral QHS  . senna-docusate  2 tablet Oral Daily  . sodium chloride  3 mL Intravenous Q12H       Electrolyte/Renal Profile and Glucose Profile:   Recent Labs Lab 01/28/15 0430 01/29/15 0413 02/01/15 0419  NA 142 142 140  K 3.7 3.9 4.1  CL 103 103 102  CO2 34* 32 32  BUN 6 10 23*  CREATININE 0.72 0.74 0.75  CALCIUM 9.1 9.7 9.0  GLUCOSE 119* 161* 162*   Protein Profile:  Recent Labs Lab 01/28/15 0430  ALBUMIN 3.5     Gastrointestinal Profile: Last BM:11/29    Weight Change: stable wt     Diet Order:  Diet Heart Room service appropriate?: Yes; Fluid consistency:: Thin  Skin:   reviewed   Height:   Ht Readings from Last 1 Encounters:  01/28/15 5\' 5"  (1.651 m)    Weight:   Wt Readings from Last 1 Encounters:  01/28/15 360 lb (163.295 kg)    Ideal Body Weight:     BMI:  Body mass index is 59.91 kg/(m^2).   EDUCATION NEEDS:   No education needs identified at this time  LOW Care Level  Virga Haltiwanger B. Zenia Resides, [place], [place] (pager)

## 2015-02-04 MED ORDER — FUROSEMIDE 10 MG/ML IJ SOLN
40.0000 mg | Freq: Two times a day (BID) | INTRAMUSCULAR | Status: DC
  Administered 2015-02-04 – 2015-02-05 (×2): 40 mg via INTRAVENOUS
  Filled 2015-02-04 (×3): qty 4

## 2015-02-04 MED ORDER — LINEZOLID 600 MG PO TABS
600.0000 mg | ORAL_TABLET | Freq: Two times a day (BID) | ORAL | Status: DC
  Administered 2015-02-04 – 2015-02-08 (×9): 600 mg via ORAL
  Filled 2015-02-04 (×9): qty 1

## 2015-02-04 MED ORDER — ENOXAPARIN SODIUM 30 MG/0.3ML ~~LOC~~ SOLN: 30.0000 mg | Freq: Two times a day (BID) | SUBCUTANEOUS | Status: DC

## 2015-02-04 MED ORDER — ENOXAPARIN SODIUM 40 MG/0.4ML ~~LOC~~ SOLN
40.0000 mg | Freq: Two times a day (BID) | SUBCUTANEOUS | Status: DC
  Administered 2015-02-04 – 2015-02-08 (×9): 40 mg via SUBCUTANEOUS
  Filled 2015-02-04 (×9): qty 0.4

## 2015-02-04 MED ORDER — GUAIFENESIN 100 MG/5ML PO SOLN
10.0000 mL | ORAL | Status: DC | PRN
  Administered 2015-02-07: 200 mg via ORAL
  Filled 2015-02-04: qty 10

## 2015-02-04 MED ORDER — METHYLPREDNISOLONE SODIUM SUCC 125 MG IJ SOLR
60.0000 mg | Freq: Four times a day (QID) | INTRAMUSCULAR | Status: DC
  Administered 2015-02-04 – 2015-02-05 (×5): 60 mg via INTRAVENOUS
  Filled 2015-02-04 (×5): qty 2

## 2015-02-04 NOTE — Progress Notes (Signed)
Patient stated she will wear the CPAP if she feels like it. Instructed patient to call RN to call RT if she wants to use the CPAP. CPAP set up and ready at bedside.

## 2015-02-04 NOTE — Progress Notes (Addendum)
Physical Therapy Treatment Patient Details Name: Loretta Wade MRN: [id number] DOB: 03-24-1950 Today's Date: 02/04/2015    History of Present Illness Pt is a 64 y/o female that presents with increased shortness of breath, has recently been admitted for renal failure. On previous admissions she has been very limited with ambulation.    PT Comments    Pt agreeable to PT today. Pt notes increasing shortness of breath and some dizziness with activity. Pt notes bilateral lower extremity pain and some chest pain that comes and goes; nursing aware of according to pt. Pt continues to demonstrate bed mobility and transfers with modified independence with increased time/effort and use of a bariatric rolling walker; however, patient's ambulation distance is decreasing. Pt only tolerated bed to chair with increased shortness of breath/fatigue requiring rest before being able to participate in chair exercises. Pt requires rest as well between low level exercises. O2 saturation on 2 liters remains 93-94% post ambulation to chair and exercises. In earlier chart review, it was noted that pt is adamant about going home, not to rehab. In light of decline in ambulation, endurance and function during hospital stay, spoke with social work regarding discharge placement. Plan is for pt to go home with hospice otherwise would recommend skilled nursing facility based on decline.   Follow Up Recommendations  SNF (based on pt's declining function,endurance and ambulation)     Equipment Recommendations       Recommendations for Other Services       Precautions / Restrictions Precautions Precautions: Fall Restrictions Weight Bearing Restrictions: No    Mobility  Bed Mobility Overal bed mobility: Modified Independent Bed Mobility: Supine to Sit     Supine to sit: Modified independent (Device/Increase time)     General bed mobility comments: Increased effort and use of rails  Transfers Overall transfer  level: Modified independent Equipment used: Rolling walker (2 wheeled) Transfers: Sit to/from Stand Sit to Stand: Modified independent (Device/Increase time)         General transfer comment: STS with increased SOB; good safety  Ambulation/Gait Ambulation/Gait assistance: Supervision Ambulation Distance (Feet): 5 Feet Assistive device: Rolling walker (2 wheeled)   Gait velocity: reduced, but near baseline Gait velocity interpretation: Below normal speed for age/gender General Gait Details: Fatigues with increased SOB and mild dizziness bed to chair; no increasing chest pain.    Stairs            Wheelchair Mobility    Modified Rankin (Stroke Patients Only)       Balance Overall balance assessment: Modified Independent                                  Cognition Arousal/Alertness: Awake/alert Behavior During Therapy: WFL for tasks assessed/performed Overall Cognitive Status: Within Functional Limits for tasks assessed                      Exercises General Exercises - Lower Extremity Ankle Circles/Pumps: AROM;Both;20 reps (long sit) Quad Sets: Strengthening;Both;20 reps (long sit) Gluteal Sets: Strengthening;Both;20 reps (long sit) Heel Slides: AROM;Both;20 reps (long sit) Hip ABduction/ADduction: AROM;Both;20 reps (long sit) Other Exercises Other Exercises: Adductor squeeze 20x long sit    General Comments        Pertinent Vitals/Pain Pain Assessment: 0-10 Pain Score: 8  Pain Location: BLEs/chest (reports nursing aware of chest pain; not a new symptom) Pain Intervention(s): Limited activity within patient's tolerance;Monitored during session;Repositioned  Home Living                      Prior Function            PT Goals (current goals can now be found in the care plan section) Progress towards PT goals: Not progressing toward goals - comment    Frequency  Min 2X/week    PT Plan Discharge plan needs to be  updated    Co-evaluation             End of Session Equipment Utilized During Treatment: Oxygen Activity Tolerance: Patient limited by fatigue (limited by SOB) Patient left: in chair;with call bell/phone within reach;with chair alarm set     Time: [id number] PT Time Calculation (min) (ACUTE ONLY): 23 min  Charges:  $Gait Training: 8-22 mins $Therapeutic Exercise: 8-22 mins                    G Codes:      Charlaine Dalton 02/04/2015, 11:32 AM

## 2015-02-04 NOTE — Progress Notes (Signed)
Pt complaining of SOB, wheezing and dizziness. MD [place] notified. MD will come visit pt later this morning. Will continue to monitor.

## 2015-02-04 NOTE — Progress Notes (Signed)
Visit made. Patient seen sitting up in bed,  reports she had increased shortness of breath overnight, appears more dyspneic with conversation. Writer spoke with attending physician Dr. Earleen Newport, who reports continued wheezing. Plan is for patient to receive a dose of IV lasix and possible pulmonary consult. She continues on IV antibiotics and steroids. No discharge date at this time. Will continue to follow and update hospice team. Per staff RN Wilhemena Durie, una boots to lower legs be changed tomorrow. Flo Shanks RN, BSN, [facility], [facility] [phone] c

## 2015-02-04 NOTE — Progress Notes (Signed)
Date: 02/04/2015,   MRN# [id number] Loretta Wade 07/24/1950 Code Status:     Code Status Orders        Start     Ordered   01/28/15 0808  Full code   Continuous     01/28/15 0808     Hosp day:@LENGTHOFSTAYDAYS @ Referring MD: @ATDPROV @        AdmissionWeight: (!) 376 lb 15.8 oz (171 kg)                 CurrentWeight: (!) 360 lb (163.295 kg)  CC: SOB  HPI: This is a 64 year old lady, ex smoker, very overweight, obstructive sleep apnea (on cpap at home she said), copd, here copd exacerbation, clear cxr, leg edema, no dvt on doppler and bouts of anxiety.  Presently less sob, voices she was in panic mode earlier. Pulmonary asked to evaluate her sob. She denies angina, calf pain, hemoptysis, ectopy or syncope.    PMHX:   Past Medical History  Diagnosis Date  . OA (osteoarthritis)   . Fibromyalgia   . Anemia   . Sleep apnea   . Fatigue   . Edema   . Esophageal reflux   . Nocturia   . Hypertension   . COPD (chronic obstructive pulmonary disease) (HCC)    Surgical Hx:  Past Surgical History  Procedure Laterality Date  . Cholecystectomy    . Total knee arthroplasty      right  . Vesicovaginal fistula closure w/ tah    . Abdominal hysterectomy     Family Hx:  Family History  Problem Relation Age of Onset  . Hypertension Mother   . Diabetes    . Breast cancer Daughter    Social Hx:   Social History  Substance Use Topics  . Smoking status: Former Smoker    Quit date: 11/04/1983  . Smokeless tobacco: None  . Alcohol Use: No   Medication:    Home Medication:  No current outpatient prescriptions on file.  Current Medication: @CURMEDTAB @   Allergies:  Review of patient's allergies indicates no known allergies.  Review of Systems: Gen:  Denies  fever, sweats, chills HEENT: Denies blurred vision, double vision, ear pain, eye pain, hearing loss, nose bleeds, sore throat Cvc:  No dizziness, chest pain or heaviness Resp: less sob this pm, no pleurisy,  hemoptysis, less cough    Gi: Denies swallowing difficulty, stomach pain, nausea or vomiting, diarrhea, constipation, bowel incontinence Gu:  Denies bladder incontinence, burning urine Ext:   No Joint pain, stiffness or swelling Skin: No skin rash, easy bruising or bleeding or hives Endoc:  No polyuria, polydipsia , polyphagia or weight change Psych: No depression, insomnia or hallucinations  Other:  All other systems negative  Physical Examination:   VS: BP 142/78 mmHg  Pulse 69  Temp(Src) 97.9 F (36.6 C) (Oral)  Resp 20  Ht 5\' 5"  (1.651 m)  Wt 360 lb (163.295 kg)  BMI 59.91 kg/m2  SpO2 97%  General Appearance: No distress, quite over weight  Neuro/psych without focal findings, mental status, speech normal, alert and oriented, cranial nerves 2-12 intact, reflexes normal and symmetric, sensation grossly normal  HEENT: PERRLA, EOM intact, no ptosis, no other lesions noticed NECK: Short, supple, no stridor  Pulmonary:.distant wheezing, No rales  :   Cardiovascular:  Normal S1,S2.  No m/r/g.  Abdominal aorta pulsation normal.    Abdomen:Benign, Soft, non-tender, No masses, hepatosplenomegaly, No lymphadenopathy Endoc: No evident thyromegaly, no signs of acromegaly or Cushing features  Skin:   warm, no rashes, no ecchymosis  Extremities: normal, no cyanosis, clubbing, +ve edema, warm with normal capillary refill.   Labs results:     Rad results:  CLINICAL DATA: Cough, shortness of breath, wheezing  EXAM: CHEST 2 VIEW  COMPARISON: 01/28/2015  FINDINGS: Cardiomediastinal silhouette is stable. No acute infiltrate or pulmonary edema. Mild left basilar atelectasis. Linear atelectasis noted in lingula. Limited study by patient's large body habitus.  IMPRESSION: No infiltrate or pulmonary edema. Linear atelectasis noted in lingula and left base.   Electronically Signed  By: Natasha Mead M.D.  On: 01/30/2015 15:03  IMPRESSION: No evidence of deep venous  thrombosis seen in either lower extremity.   Electronically Signed  By: Lupita Raider, M.D.  On: 02/01/2015 10:37  Study Conclusions  - Procedure narrative: Transthoracic echocardiography. Image quality was suboptimal. The study was technically difficult. - Left ventricle: Systolic function was normal. The estimated ejection fraction was in the range of 55% to 65%. - Aortic valve: There was mild regurgitation. Valve area (Vmax): 2.44 cm^2. - Left atrium: The atrium was mildly dilated. - Right atrium: The atrium was mildly dilated.  Assessment and Plan: Her dyspnea is multifactorial: copd exacerbation, obesity, anxiety, ??? Pulmonary hypertension -solumedrol 60 mg q 6 tonight -laba, ics, anticholinergic, short term albuterol -o2 -dvt prophylaxis -out patient pfts and f/u (she has miss her initial appt with pulmonary at [facility])  Obstruction sleep apnea -cpap during sleep (she can bring her system in) -avoid sedation  Leg edema, ? Dependent, occult diastolic chf, right heart failure (pulmonary htn) -duiresis -watch salt -elevate legs Serial echo's  I have personally obtained a history, examined the patient, evaluated laboratory and imaging results, formulated the assessment and plan and placed orders.  The Patient requires high complexity decision making for assessment and support, frequent evaluation and titration of therapies, application of advanced monitoring technologies and extensive interpretation of multiple databases.   Ponce Skillman,M.D. Pulmonary & Critical care Medicine [facility]

## 2015-02-04 NOTE — Progress Notes (Signed)
Patient ID: MERANDA VONDRA, female   DOB: 1950-06-11, 64 y.o.   MRN: [id number]  [facility] PROGRESS NOTE  PCP: Keith Rake, MD  HPI/Subjective: Patient had a rough night with regards to her breathing. Still having shortness of breath this morning when I saw her. Still having a lot of cough.  Objective: Filed Vitals:   02/04/15 0809 02/04/15 1100  BP: 128/76 142/80  Pulse: 74 77  Temp: 97.1 F (36.2 C) 97.4 F (36.3 C)  Resp: 18 20    Filed Weights   01/28/15 0352 01/28/15 1033  Weight: 171 kg (376 lb 15.8 oz) 163.295 kg (360 lb)    ROS: Review of Systems  Constitutional: Negative for fever and chills.  HENT: Positive for nosebleeds.   Eyes: Negative for blurred vision.  Respiratory: Positive for cough, shortness of breath and wheezing.   Cardiovascular: Negative for chest pain.  Gastrointestinal: Negative for nausea, vomiting, abdominal pain, diarrhea, constipation and blood in stool.  Genitourinary: Negative for dysuria.  Musculoskeletal: Positive for myalgias. Negative for joint pain.  Neurological: Negative for dizziness and headaches.   Exam: Physical Exam  Constitutional: She is oriented to person, place, and time.  HENT:  Nose: No mucosal edema.  Mouth/Throat: No oropharyngeal exudate or posterior oropharyngeal edema.  Eyes: Conjunctivae, EOM and lids are normal. Pupils are equal, round, and reactive to light.  Neck: No JVD present. Carotid bruit is not present. No edema present. No thyroid mass and no thyromegaly present.  Cardiovascular: S1 normal and S2 normal.  Exam reveals no gallop.   No murmur heard. Pulses:      Dorsalis pedis pulses are 2+ on the right side, and 2+ on the left side.  Respiratory: Accessory muscle usage present. No respiratory distress. She has decreased breath sounds in the right middle field, the right lower field, the left middle field and the left lower field. She has wheezes in the right middle field, the right  lower field, the left middle field and the left lower field. She has no rhonchi. She has no rales.  GI: Soft. Bowel sounds are normal. There is no tenderness.  Musculoskeletal:       Right ankle: She exhibits swelling.       Left ankle: She exhibits swelling.  Lymphadenopathy:    She has no cervical adenopathy.  Neurological: She is alert and oriented to person, place, and time. No cranial nerve deficit.  Skin: Skin is warm. No rash noted. Nails show no clubbing.  Psychiatric: She has a normal mood and affect.    Data Reviewed: Basic Metabolic Panel:  Recent Labs Lab 01/29/15 0413 02/01/15 0419  NA 142 140  K 3.9 4.1  CL 103 102  CO2 32 32  GLUCOSE 161* 162*  BUN 10 23*  CREATININE 0.74 0.75  CALCIUM 9.7 9.0  CBC:  Recent Labs Lab 01/29/15 0413 02/01/15 0419  WBC 5.6 7.7  HGB 10.8* 10.8*  HCT 34.2* 34.3*  MCV 87.5 87.3  PLT 239 222      Recent Results (from the past 240 hour(s))  MRSA PCR Screening     Status: None   Collection Time: 01/28/15 11:52 AM  Result Value Ref Range Status   MRSA by PCR NEGATIVE NEGATIVE Final    Comment:        The GeneXpert MRSA Assay (FDA approved for NASAL specimens only), is one component of a comprehensive MRSA colonization surveillance program. It is not intended to diagnose MRSA infection nor to  guide or monitor treatment for MRSA infections.   Urine culture     Status: None   Collection Time: 01/28/15 12:39 PM  Result Value Ref Range Status   Specimen Description URINE, CLEAN CATCH  Final   Special Requests Normal  Final   Culture   Final    50,000 COLONIES/mL ESCHERICHIA COLI 30,000 COLONIES/mL ENTEROCOCCUS FAECALIS    Report Status 01/31/2015 FINAL  Final   Organism ID, Bacteria ESCHERICHIA COLI  Final   Organism ID, Bacteria ENTEROCOCCUS FAECALIS  Final      Susceptibility   Escherichia coli - MIC*    AMPICILLIN >=32 RESISTANT Resistant     CEFAZOLIN <=4 SENSITIVE Sensitive     CEFTRIAXONE <=1 SENSITIVE  Sensitive     CIPROFLOXACIN >=4 RESISTANT Resistant     GENTAMICIN 2 SENSITIVE Sensitive     IMIPENEM <=0.25 SENSITIVE Sensitive     NITROFURANTOIN <=16 SENSITIVE Sensitive     TRIMETH/SULFA >=320 RESISTANT Resistant     PIP/TAZO Value in next row Sensitive      SENSITIVE<=4    LEVOFLOXACIN Value in next row Resistant      RESISTANT>=8    * 50,000 COLONIES/mL ESCHERICHIA COLI   Enterococcus faecalis - MIC*    AMPICILLIN Value in next row Sensitive      RESISTANT>=8    NITROFURANTOIN Value in next row Sensitive      RESISTANT>=8    LINEZOLID Value in next row Sensitive      RESISTANT>=8    CIPROFLOXACIN Value in next row Resistant      RESISTANT>=8    LEVOFLOXACIN Value in next row Resistant      RESISTANT>=8    TETRACYCLINE Value in next row Resistant      RESISTANT>=16    * 30,000 COLONIES/mL ENTEROCOCCUS FAECALIS  Culture, expectorated sputum-assessment     Status: None   Collection Time: 01/29/15  9:49 AM  Result Value Ref Range Status   Specimen Description EXPECTORATED SPUTUM  Final   Special Requests Normal  Final   Sputum evaluation   Final    Sputum specimen not acceptable for testing.  Please recollect.   Results Called to: [facility] 01/29/2015 1528 BY JRS.    Report Status 01/29/2015 FINAL  Final  Culture, expectorated sputum-assessment     Status: None   Collection Time: 01/30/15 12:22 PM  Result Value Ref Range Status   Specimen Description EXPECTORATED SPUTUM  Final   Special Requests NONE  Final   Sputum evaluation THIS SPECIMEN IS ACCEPTABLE FOR SPUTUM CULTURE  Final   Report Status 01/30/2015 FINAL  Final  Culture, respiratory (NON-Expectorated)     Status: None   Collection Time: 01/30/15 12:22 PM  Result Value Ref Range Status   Specimen Description EXPECTORATED SPUTUM  Final   Special Requests NONE Reflexed from [id number]  Final   Gram Stain   Final    FEW WBC SEEN FEW GRAM POSITIVE COCCI IN CLUSTERS FEW YEAST GOOD SPECIMEN - 80-90% WBCS     Culture   Final    LIGHT GROWTH METHICILLIN RESISTANT STAPHYLOCOCCUS AUREUS CRITICAL RESULT CALLED TO, READ BACK BY AND VERIFIED WITH: APRIL RAST AT 1010 ON 02/02/15  CTJ    Report Status 02/03/2015 FINAL  Final   Organism ID, Bacteria METHICILLIN RESISTANT STAPHYLOCOCCUS AUREUS  Final      Susceptibility   Methicillin resistant staphylococcus aureus - MIC*    CIPROFLOXACIN >=8 RESISTANT Resistant     GENTAMICIN <=0.5 SENSITIVE Sensitive  OXACILLIN >=4 RESISTANT Resistant     VANCOMYCIN 1 SENSITIVE Sensitive     TRIMETH/SULFA <=10 SENSITIVE Sensitive     CEFOXITIN SCREEN Value in next row Resistant      POSITIVECEFOXITIN SCREEN - This test may be used to predict mecA-mediated oxacillin resistance, and it is based on the cefoxitin disk screen test.  The cefoxitin screen and oxacillin work in combination to determine the final interpretation reported for oxacillin.     Inducible Clindamycin Value in next row Sensitive      POSITIVECEFOXITIN SCREEN - This test may be used to predict mecA-mediated oxacillin resistance, and it is based on the cefoxitin disk screen test.  The cefoxitin screen and oxacillin work in combination to determine the final interpretation reported for oxacillin.     ERYTHROMYCIN Value in next row Resistant      RESISTANT>=8    TETRACYCLINE Value in next row Sensitive      SENSITIVE<=1    CLINDAMYCIN Value in next row Resistant      RESISTANT>=8    LINEZOLID Value in next row Sensitive      SENSITIVE2    * LIGHT GROWTH METHICILLIN RESISTANT STAPHYLOCOCCUS AUREUS      Scheduled Meds: . albuterol  2.5 mg Nebulization TID  . budesonide (PULMICORT) nebulizer solution  0.25 mg Nebulization BID  . cholecalciferol  1,000 Units Oral Daily  . docusate sodium  100 mg Oral BID  . enoxaparin (LOVENOX) injection  40 mg Subcutaneous BID  . furosemide  40 mg Intravenous BID  . linezolid  600 mg Oral Q12H  . magnesium oxide  400 mg Oral Daily  . methylPREDNISolone  (SOLU-MEDROL) injection  60 mg Intravenous Q6H  . morphine  15 mg Oral Q12H  . morphine  30 mg Oral Q12H  . nystatin  5 mL Oral QID  . oxybutynin  5 mg Oral Daily  . pantoprazole  40 mg Oral BID  . pregabalin  150 mg Oral BID  . rOPINIRole  0.75 mg Oral QHS  . senna-docusate  2 tablet Oral Daily  . sodium chloride  3 mL Intravenous Q12H    Assessment/Plan:  1. COPD exacerbation, chronic respiratory failure on 2 L of oxygen. Increase Solu-Medrol 60 mg IV every 6 hours.  MRSA in the sputum-  Zyvox by mouth. Continue nebulizer treatments and budesonide nebulizers. Pulmonary consultation Likely will switch over to Bactrim upon discharge home. 2. Edema lower extremities- change Lasix to IV twice a day. Echocardiogram showing good ejection fraction. Chest x-ray does not show heart failure. 3. Gastroesophageal reflux disease without esophagitis continue PPI 4. Essential hypertension- continue usual medications 5. Fibromyalgia and chronic pain- continue chronic pain medications 6. Headache- 7. Acute cystitis without hematuria- urine culture is not growing 100,000 colonies so we will not need to treat. 8. Epistaxis and blood in the stool- hemoglobin stable  Code Status:     Code Status Orders        Start     Ordered   01/28/15 0808  Full code   Continuous     01/28/15 0808     Disposition Plan: Physical therapy recommends rehabilitation. Patient follows with hospice at home.  Antibiotics:  Zyvox  Time spent: 20 minutes  Loletha Grayer  [facility]

## 2015-02-05 MED ORDER — FUROSEMIDE 40 MG PO TABS
40.0000 mg | ORAL_TABLET | Freq: Two times a day (BID) | ORAL | Status: DC
  Administered 2015-02-06 – 2015-02-07 (×4): 40 mg via ORAL
  Filled 2015-02-05 (×5): qty 1

## 2015-02-05 MED ORDER — METHYLPREDNISOLONE SODIUM SUCC 125 MG IJ SOLR
60.0000 mg | Freq: Three times a day (TID) | INTRAMUSCULAR | Status: DC
  Administered 2015-02-05 – 2015-02-08 (×8): 60 mg via INTRAVENOUS
  Filled 2015-02-05 (×8): qty 2

## 2015-02-05 NOTE — Progress Notes (Signed)
Date: 02/05/2015,   MRN# [id number] Inus Zaccardi [place] 06-Mar-1951 Code Status:     Code Status Orders        Start     Ordered   01/28/15 0808  Full code   Continuous     01/28/15 0808     Hosp day:@LENGTHOFSTAYDAYS @ Referring MD: @ATDPROV @      HPI: Less sob today,less anxious. Nom new complaints. Home soon  PMHX:   Past Medical History  Diagnosis Date  . OA (osteoarthritis)   . Fibromyalgia   . Anemia   . Sleep apnea   . Fatigue   . Edema   . Esophageal reflux   . Nocturia   . Hypertension   . COPD (chronic obstructive pulmonary disease) (HCC)    Surgical Hx:  Past Surgical History  Procedure Laterality Date  . Cholecystectomy    . Total knee arthroplasty      right  . Vesicovaginal fistula closure w/ tah    . Abdominal hysterectomy     Family Hx:  Family History  Problem Relation Age of Onset  . Hypertension Mother   . Diabetes    . Breast cancer Daughter    Social Hx:   Social History  Substance Use Topics  . Smoking status: Former Smoker    Quit date: 11/04/1983  . Smokeless tobacco: None  . Alcohol Use: No   Medication:    Home Medication:  No current outpatient prescriptions on file.  Current Medication: @CURMEDTAB @   Allergies:  Review of patient's allergies indicates no known allergies.  Review of Systems: Gen:  Denies  fever, sweats, chills HEENT: Denies blurred vision, double vision, ear pain, eye pain, hearing loss, nose bleeds, sore throat Cvc:  No dizziness, chest pain or heaviness Resp:  Less sob, no pleurisy, wheezing  Gi: Denies swallowing difficulty, stomach pain, nausea or vomiting, diarrhea, constipation, bowel incontinence Gu:  Denies bladder incontinence, burning urine Ext:   No Joint pain, stiffness or swelling Skin: No skin rash, easy bruising or bleeding or hives Endoc:  No polyuria, polydipsia , polyphagia or weight change Psych: No depression, insomnia or hallucinations  Other:  All other systems  negative  Physical Examination:   VS: BP 142/76 mmHg  Pulse 57  Temp(Src) 97.2 F (36.2 C) (Oral)  Resp 24  Ht 5\' 5"  (1.651 m)  Wt 360 lb (163.295 kg)  BMI 59.91 kg/m2  SpO2 95%  General Appearance: No distress, in bed obese  Neuro/psych without focal findings, mental status, speech normal, alert and oriented, cranial nerves 2-12 intact, reflexes normal and symmetric, sensation grossly normal  HEENT: PERRLA, EOM intact, no ptosis, no other lesions noticed, : Pulmonary:.No wheezing, No rales     Cardiovascular:  Normal S1,S2.  No m/r/g.  Abdominal aorta pulsation normal.    Abdomen:Benign, Soft, non-tender, No masses, hepatosplenomegaly, No lymphadenopathy Endoc: No evident thyromegaly, no signs of acromegaly or Cushing features Skin:   warm, no rashes, no ecchymosis  Extremities: normal, no cyanosis, clubbing, +ve edema, warm with normal capillary refill. Other findings:   Labs results:     EKG:     Other:   Assessment and Plan: Her dyspnea is multifactorial: copd exacerbation, obesity, anxiety, ??? Pulmonary hypertension -solumedrol 60 mg q 8 today,  -laba, ics, anticholinergic, short term albuterol -o2 -dvt prophylaxis -out patient pfts and f/u (she has miss her initial appt with pulmonary [facility])  Obstruction sleep apnea -cpap during sleep (she can bring her system in) -avoid sedation  Leg  edema, ? Dependent, occult diastolic chf, right heart failure (pulmonary htn) -duiresis -watch salt -elevate legs -serial echo   I have personally obtained a history, examined the patient, evaluated laboratory and imaging results, formulated the assessment and plan and placed orders.  The Patient requires high complexity decision making for assessment and support, frequent evaluation and titration of therapies, application of advanced monitoring technologies and extensive interpretation of multiple databases.   Joesiah Lonon,M.D. Pulmonary & Critical care Medicine [facility]

## 2015-02-05 NOTE — Progress Notes (Signed)
Patient ID: Loretta Wade, female   DOB: [birth year], 64 y.o.   MRN: [id number]  [facility] PROGRESS NOTE  PCP: Keith Rake, MD  HPI/Subjective: Patient still not feeling well. Gets swimmy headed easily. Cough and shortness of breath and wheeze still persistent  Objective: Filed Vitals:   02/05/15 0845 02/05/15 1612  BP: 142/76 155/69  Pulse:  90  Temp: 97.2 F (36.2 C) 98.3 F (36.8 C)  Resp: 24 18    Filed Weights   01/28/15 0352 01/28/15 1033  Weight: 171 kg (376 lb 15.8 oz) 163.295 kg (360 lb)    ROS: Review of Systems  Constitutional: Negative for fever and chills.  HENT: Positive for nosebleeds.   Eyes: Negative for blurred vision.  Respiratory: Positive for cough, shortness of breath and wheezing.   Cardiovascular: Negative for chest pain.  Gastrointestinal: Negative for nausea, vomiting, abdominal pain, diarrhea, constipation and blood in stool.  Genitourinary: Negative for dysuria.  Musculoskeletal: Positive for myalgias. Negative for joint pain.  Neurological: Negative for dizziness and headaches.   Exam: Physical Exam  Constitutional: She is oriented to person, place, and time.  HENT:  Nose: No mucosal edema.  Mouth/Throat: No oropharyngeal exudate or posterior oropharyngeal edema.  Eyes: Conjunctivae, EOM and lids are normal. Pupils are equal, round, and reactive to light.  Neck: No JVD present. Carotid bruit is not present. No edema present. No thyroid mass and no thyromegaly present.  Cardiovascular: S1 normal and S2 normal.  Exam reveals no gallop.   No murmur heard. Pulses:      Dorsalis pedis pulses are 2+ on the right side, and 2+ on the left side.  Respiratory: No accessory muscle usage. No respiratory distress. She has decreased breath sounds in the right middle field, the right lower field, the left middle field and the left lower field. She has wheezes in the right middle field, the right lower field, the left middle field and the  left lower field. She has no rhonchi. She has no rales.  GI: Soft. Bowel sounds are normal. There is no tenderness.  Musculoskeletal:       Right ankle: She exhibits swelling.       Left ankle: She exhibits swelling.  Lymphadenopathy:    She has no cervical adenopathy.  Neurological: She is alert and oriented to person, place, and time. No cranial nerve deficit.  Skin: Skin is warm. No rash noted. Nails show no clubbing.  Psychiatric: She has a normal mood and affect.    Data Reviewed: Basic Metabolic Panel:  Recent Labs Lab 02/01/15 0419  NA 140  K 4.1  CL 102  CO2 32  GLUCOSE 162*  BUN 23*  CREATININE 0.75  CALCIUM 9.0  CBC:  Recent Labs Lab 02/01/15 0419  WBC 7.7  HGB 10.8*  HCT 34.3*  MCV 87.3  PLT 222      Recent Results (from the past 240 hour(s))  MRSA PCR Screening     Status: None   Collection Time: 01/28/15 11:52 AM  Result Value Ref Range Status   MRSA by PCR NEGATIVE NEGATIVE Final    Comment:        The GeneXpert MRSA Assay (FDA approved for NASAL specimens only), is one component of a comprehensive MRSA colonization surveillance program. It is not intended to diagnose MRSA infection nor to guide or monitor treatment for MRSA infections.   Urine culture     Status: None   Collection Time: 01/28/15 12:39 PM  Result  Value Ref Range Status   Specimen Description URINE, CLEAN CATCH  Final   Special Requests Normal  Final   Culture   Final    50,000 COLONIES/mL ESCHERICHIA COLI 30,000 COLONIES/mL ENTEROCOCCUS FAECALIS    Report Status 01/31/2015 FINAL  Final   Organism ID, Bacteria ESCHERICHIA COLI  Final   Organism ID, Bacteria ENTEROCOCCUS FAECALIS  Final      Susceptibility   Escherichia coli - MIC*    AMPICILLIN >=32 RESISTANT Resistant     CEFAZOLIN <=4 SENSITIVE Sensitive     CEFTRIAXONE <=1 SENSITIVE Sensitive     CIPROFLOXACIN >=4 RESISTANT Resistant     GENTAMICIN 2 SENSITIVE Sensitive     IMIPENEM <=0.25 SENSITIVE Sensitive      NITROFURANTOIN <=16 SENSITIVE Sensitive     TRIMETH/SULFA >=320 RESISTANT Resistant     PIP/TAZO Value in next row Sensitive      SENSITIVE<=4    LEVOFLOXACIN Value in next row Resistant      RESISTANT>=8    * 50,000 COLONIES/mL ESCHERICHIA COLI   Enterococcus faecalis - MIC*    AMPICILLIN Value in next row Sensitive      RESISTANT>=8    NITROFURANTOIN Value in next row Sensitive      RESISTANT>=8    LINEZOLID Value in next row Sensitive      RESISTANT>=8    CIPROFLOXACIN Value in next row Resistant      RESISTANT>=8    LEVOFLOXACIN Value in next row Resistant      RESISTANT>=8    TETRACYCLINE Value in next row Resistant      RESISTANT>=16    * 30,000 COLONIES/mL ENTEROCOCCUS FAECALIS  Culture, expectorated sputum-assessment     Status: None   Collection Time: 01/29/15  9:49 AM  Result Value Ref Range Status   Specimen Description EXPECTORATED SPUTUM  Final   Special Requests Normal  Final   Sputum evaluation   Final    Sputum specimen not acceptable for testing.  Please recollect.   Results Called to: KIM [facility] 01/29/2015 1528 BY JRS.    Report Status 01/29/2015 FINAL  Final  Culture, expectorated sputum-assessment     Status: None   Collection Time: 01/30/15 12:22 PM  Result Value Ref Range Status   Specimen Description EXPECTORATED SPUTUM  Final   Special Requests NONE  Final   Sputum evaluation THIS SPECIMEN IS ACCEPTABLE FOR SPUTUM CULTURE  Final   Report Status 01/30/2015 FINAL  Final  Culture, respiratory (NON-Expectorated)     Status: None   Collection Time: 01/30/15 12:22 PM  Result Value Ref Range Status   Specimen Description EXPECTORATED SPUTUM  Final   Special Requests NONE Reflexed from [id number]  Final   Gram Stain   Final    FEW WBC SEEN FEW GRAM POSITIVE COCCI IN CLUSTERS FEW YEAST GOOD SPECIMEN - 80-90% WBCS    Culture   Final    LIGHT GROWTH METHICILLIN RESISTANT STAPHYLOCOCCUS AUREUS CRITICAL RESULT CALLED TO, READ BACK BY AND VERIFIED WITH:  APRIL RAST AT 1010 ON 02/02/15  CTJ    Report Status 02/03/2015 FINAL  Final   Organism ID, Bacteria METHICILLIN RESISTANT STAPHYLOCOCCUS AUREUS  Final      Susceptibility   Methicillin resistant staphylococcus aureus - MIC*    CIPROFLOXACIN >=8 RESISTANT Resistant     GENTAMICIN <=0.5 SENSITIVE Sensitive     OXACILLIN >=4 RESISTANT Resistant     VANCOMYCIN 1 SENSITIVE Sensitive     TRIMETH/SULFA <=10 SENSITIVE Sensitive     CEFOXITIN  SCREEN Value in next row Resistant      POSITIVECEFOXITIN SCREEN - This test may be used to predict mecA-mediated oxacillin resistance, and it is based on the cefoxitin disk screen test.  The cefoxitin screen and oxacillin work in combination to determine the final interpretation reported for oxacillin.     Inducible Clindamycin Value in next row Sensitive      POSITIVECEFOXITIN SCREEN - This test may be used to predict mecA-mediated oxacillin resistance, and it is based on the cefoxitin disk screen test.  The cefoxitin screen and oxacillin work in combination to determine the final interpretation reported for oxacillin.     ERYTHROMYCIN Value in next row Resistant      RESISTANT>=8    TETRACYCLINE Value in next row Sensitive      SENSITIVE<=1    CLINDAMYCIN Value in next row Resistant      RESISTANT>=8    LINEZOLID Value in next row Sensitive      SENSITIVE2    * LIGHT GROWTH METHICILLIN RESISTANT STAPHYLOCOCCUS AUREUS      Scheduled Meds: . albuterol  2.5 mg Nebulization TID  . budesonide (PULMICORT) nebulizer solution  0.25 mg Nebulization BID  . cholecalciferol  1,000 Units Oral Daily  . docusate sodium  100 mg Oral BID  . enoxaparin (LOVENOX) injection  40 mg Subcutaneous BID  . furosemide  40 mg Intravenous BID  . linezolid  600 mg Oral Q12H  . magnesium oxide  400 mg Oral Daily  . methylPREDNISolone (SOLU-MEDROL) injection  60 mg Intravenous Q8H  . morphine  15 mg Oral Q12H  . morphine  30 mg Oral Q12H  . nystatin  5 mL Oral QID  .  oxybutynin  5 mg Oral Daily  . pantoprazole  40 mg Oral BID  . pregabalin  150 mg Oral BID  . rOPINIRole  0.75 mg Oral QHS  . senna-docusate  2 tablet Oral Daily  . sodium chloride  3 mL Intravenous Q12H    Assessment/Plan:  1. COPD exacerbation, chronic respiratory failure on 2 L of oxygen. Decrease Solu-Medrol 60 mg IV every 8 hours.  MRSA in the sputum-  Zyvox by mouth. Continue nebulizer treatments and budesonide nebulizers. Appreciate Pulmonary consultation.  Likely will switch over to Bactrim upon discharge home. 2. Edema lower extremities- change Lasix to oral twice a day for tomorrow. Echocardiogram showing good ejection fraction. Chest x-ray does not show heart failure. 3. Gastroesophageal reflux disease without esophagitis continue PPI 4. Essential hypertension- continue usual medications 5. Fibromyalgia and chronic pain- continue chronic pain medications 6. Headache- 7. Acute cystitis without hematuria- urine culture is not growing 100,000 colonies so we will not need to treat. 8. Epistaxis and blood in the stool- hemoglobin stable  Code Status:     Code Status Orders        Start     Ordered   01/28/15 0808  Full code   Continuous     01/28/15 0808     Disposition Plan: Physical therapy recommends rehabilitation. Patient follows with hospice at home.  Antibiotics:  Zyvox  Time spent: 20 minutes  Loletha Grayer  [facility] Hospitalists

## 2015-02-05 NOTE — Progress Notes (Signed)
Visit made. Patient seen about to get out of bed, up to the [facility]. Staff aides present. Patient did transfer herself with stand by assist, she appeared to recover well when she got back to bed, able to converse with less dyspnea. She reports that attending physician told her she would have more IV lasix today, it is scheduled BID. She also reports she feels that the pulmonologist did not really explain anything to her. She reports she did not need the CPap last night. She remains on IV steroids. Hospice team has expressed concern over patient returning home alone. Patient again declined to go to SNF for rehab. Will continue to follow and update hospice team. Flo Shanks RN, BSN, [facility]; [place] [phone] c

## 2015-02-05 NOTE — Progress Notes (Signed)
Asked patient when she would like to use the CPAP tonight she stated she does not know when she will go to bed. Patient refused last night.

## 2015-02-06 LAB — GLUCOSE, CAPILLARY
GLUCOSE-CAPILLARY: 207 mg/dL — AB (ref 65–99)
Glucose-Capillary: 116 mg/dL — ABNORMAL HIGH (ref 65–99)

## 2015-02-06 LAB — BASIC METABOLIC PANEL
ANION GAP: 6 (ref 5–15)
BUN: 24 mg/dL — AB (ref 6–20)
CO2: 34 mmol/L — AB (ref 22–32)
Calcium: 9.1 mg/dL (ref 8.9–10.3)
Chloride: 99 mmol/L — ABNORMAL LOW (ref 101–111)
Creatinine, Ser: 0.85 mg/dL (ref 0.44–1.00)
GFR calc Af Amer: >60 mL/min (ref >60)
GLUCOSE: 227 mg/dL — AB (ref 65–99)
POTASSIUM: 4 mmol/L (ref 3.5–5.1)
Sodium: 139 mmol/L (ref 135–145)

## 2015-02-06 MED ORDER — MECLIZINE HCL 12.5 MG PO TABS: 12.5000 mg | ORAL_TABLET | Freq: Three times a day (TID) | ORAL | Status: DC | PRN

## 2015-02-06 NOTE — Progress Notes (Signed)
Patient ID: Loretta Wade, female   DOB: Oct 04, 1950, 64 y.o.   MRN: [id number]  [facility] PROGRESS NOTE  PCP: Brayton El, MD  HPI/Subjective: Patient complains of dizziness with the getting up breathing slowly improving Objective: Filed Vitals:   02/06/15 0832 02/06/15 1129  BP: 157/72 148/72  Pulse: 78 81  Temp: 97.6 F (36.4 C) 97 F (36.1 C)  Resp: 18 18    Filed Weights   01/28/15 0352 01/28/15 1033  Weight: 171 kg (376 lb 15.8 oz) 163.295 kg (360 lb)    ROS: Review of Systems  Constitutional: Negative for fever and chills.  HENT: Positive for nosebleeds.   Eyes: Negative for blurred vision.  Respiratory: Positive for cough, shortness of breath and wheezing.   Cardiovascular: Negative for chest pain.  Gastrointestinal: Negative for nausea, vomiting, abdominal pain, diarrhea, constipation and blood in stool.  Genitourinary: Negative for dysuria.  Musculoskeletal: Positive for myalgias. Negative for joint pain.  Neurological: Positive dizziness and headaches.   Exam: Physical Exam  Constitutional: She is oriented to person, place, and time.  HENT:  Nose: No mucosal edema.  Mouth/Throat: No oropharyngeal exudate or posterior oropharyngeal edema.  Eyes: Conjunctivae, EOM and lids are normal. Pupils are equal, round, and reactive to light.  Neck: No JVD present. Carotid bruit is not present. No edema present. No thyroid mass and no thyromegaly present.  Cardiovascular: S1 normal and S2 normal.  Exam reveals no gallop.   No murmur heard. Pulses:      Dorsalis pedis pulses are 2+ on the right side, and 2+ on the left side.  Respiratory: No accessory muscle usage. No respiratory distress. Diminished breath sounds bilaterally with occasional wheezing throughout both lungs She has no rales.  GI: Soft. Bowel sounds are normal. There is no tenderness.  Musculoskeletal:       Right ankle: She exhibits swelling.       Left ankle: She exhibits swelling.   Lymphadenopathy:    She has no cervical adenopathy.  Neurological: She is alert and oriented to person, place, and time. No cranial nerve deficit.  Skin: Skin is warm. No rash noted. Nails show no clubbing.  Psychiatric: She has a normal mood and affect.    Data Reviewed: Basic Metabolic Panel:  Recent Labs Lab 02/01/15 0419 02/06/15 0344  NA 140 139  K 4.1 4.0  CL 102 99*  CO2 32 34*  GLUCOSE 162* 227*  BUN 23* 24*  CREATININE 0.75 0.85  CALCIUM 9.0 9.1  CBC:  Recent Labs Lab 02/01/15 0419  WBC 7.7  HGB 10.8*  HCT 34.3*  MCV 87.3  PLT 222      Recent Results (from the past 240 hour(s))  MRSA PCR Screening     Status: None   Collection Time: 01/28/15 11:52 AM  Result Value Ref Range Status   MRSA by PCR NEGATIVE NEGATIVE Final    Comment:        The GeneXpert MRSA Assay (FDA approved for NASAL specimens only), is one component of a comprehensive MRSA colonization surveillance program. It is not intended to diagnose MRSA infection nor to guide or monitor treatment for MRSA infections.   Urine culture     Status: None   Collection Time: 01/28/15 12:39 PM  Result Value Ref Range Status   Specimen Description URINE, CLEAN CATCH  Final   Special Requests Normal  Final   Culture   Final    50,000 COLONIES/mL ESCHERICHIA COLI 30,000 COLONIES/mL ENTEROCOCCUS FAECALIS  Report Status 01/31/2015 FINAL  Final   Organism ID, Bacteria ESCHERICHIA COLI  Final   Organism ID, Bacteria ENTEROCOCCUS FAECALIS  Final      Susceptibility   Escherichia coli - MIC*    AMPICILLIN >=32 RESISTANT Resistant     CEFAZOLIN <=4 SENSITIVE Sensitive     CEFTRIAXONE <=1 SENSITIVE Sensitive     CIPROFLOXACIN >=4 RESISTANT Resistant     GENTAMICIN 2 SENSITIVE Sensitive     IMIPENEM <=0.25 SENSITIVE Sensitive     NITROFURANTOIN <=16 SENSITIVE Sensitive     TRIMETH/SULFA >=320 RESISTANT Resistant     PIP/TAZO Value in next row Sensitive      SENSITIVE<=4    LEVOFLOXACIN  Value in next row Resistant      RESISTANT>=8    * 50,000 COLONIES/mL ESCHERICHIA COLI   Enterococcus faecalis - MIC*    AMPICILLIN Value in next row Sensitive      RESISTANT>=8    NITROFURANTOIN Value in next row Sensitive      RESISTANT>=8    LINEZOLID Value in next row Sensitive      RESISTANT>=8    CIPROFLOXACIN Value in next row Resistant      RESISTANT>=8    LEVOFLOXACIN Value in next row Resistant      RESISTANT>=8    TETRACYCLINE Value in next row Resistant      RESISTANT>=16    * 30,000 COLONIES/mL ENTEROCOCCUS FAECALIS  Culture, expectorated sputum-assessment     Status: None   Collection Time: 01/29/15  9:49 AM  Result Value Ref Range Status   Specimen Description EXPECTORATED SPUTUM  Final   Special Requests Normal  Final   Sputum evaluation   Final    Sputum specimen not acceptable for testing.  Please recollect.   Results Called to: KIM [facility] 01/29/2015 1528 BY JRS.    Report Status 01/29/2015 FINAL  Final  Culture, expectorated sputum-assessment     Status: None   Collection Time: 01/30/15 12:22 PM  Result Value Ref Range Status   Specimen Description EXPECTORATED SPUTUM  Final   Special Requests NONE  Final   Sputum evaluation THIS SPECIMEN IS ACCEPTABLE FOR SPUTUM CULTURE  Final   Report Status 01/30/2015 FINAL  Final  Culture, respiratory (NON-Expectorated)     Status: None   Collection Time: 01/30/15 12:22 PM  Result Value Ref Range Status   Specimen Description EXPECTORATED SPUTUM  Final   Special Requests NONE Reflexed from [id number]  Final   Gram Stain   Final    FEW WBC SEEN FEW GRAM POSITIVE COCCI IN CLUSTERS FEW YEAST GOOD SPECIMEN - 80-90% WBCS    Culture   Final    LIGHT GROWTH METHICILLIN RESISTANT STAPHYLOCOCCUS AUREUS CRITICAL RESULT CALLED TO, READ BACK BY AND VERIFIED WITH: APRIL RAST AT 1010 ON 02/02/15  CTJ    Report Status 02/03/2015 FINAL  Final   Organism ID, Bacteria METHICILLIN RESISTANT STAPHYLOCOCCUS AUREUS  Final       Susceptibility   Methicillin resistant staphylococcus aureus - MIC*    CIPROFLOXACIN >=8 RESISTANT Resistant     GENTAMICIN <=0.5 SENSITIVE Sensitive     OXACILLIN >=4 RESISTANT Resistant     VANCOMYCIN 1 SENSITIVE Sensitive     TRIMETH/SULFA <=10 SENSITIVE Sensitive     CEFOXITIN SCREEN Value in next row Resistant      POSITIVECEFOXITIN SCREEN - This test may be used to predict mecA-mediated oxacillin resistance, and it is based on the cefoxitin disk screen test.  The cefoxitin screen and oxacillin  work in combination to determine the final interpretation reported for oxacillin.     Inducible Clindamycin Value in next row Sensitive      POSITIVECEFOXITIN SCREEN - This test may be used to predict mecA-mediated oxacillin resistance, and it is based on the cefoxitin disk screen test.  The cefoxitin screen and oxacillin work in combination to determine the final interpretation reported for oxacillin.     ERYTHROMYCIN Value in next row Resistant      RESISTANT>=8    TETRACYCLINE Value in next row Sensitive      SENSITIVE<=1    CLINDAMYCIN Value in next row Resistant      RESISTANT>=8    LINEZOLID Value in next row Sensitive      SENSITIVE2    * LIGHT GROWTH METHICILLIN RESISTANT STAPHYLOCOCCUS AUREUS      Scheduled Meds: . albuterol  2.5 mg Nebulization TID  . budesonide (PULMICORT) nebulizer solution  0.25 mg Nebulization BID  . cholecalciferol  1,000 Units Oral Daily  . docusate sodium  100 mg Oral BID  . enoxaparin (LOVENOX) injection  40 mg Subcutaneous BID  . furosemide  40 mg Oral BID  . linezolid  600 mg Oral Q12H  . magnesium oxide  400 mg Oral Daily  . methylPREDNISolone (SOLU-MEDROL) injection  60 mg Intravenous Q8H  . morphine  15 mg Oral Q12H  . morphine  30 mg Oral Q12H  . nystatin  5 mL Oral QID  . oxybutynin  5 mg Oral Daily  . pantoprazole  40 mg Oral BID  . pregabalin  150 mg Oral BID  . rOPINIRole  0.75 mg Oral QHS  . senna-docusate  2 tablet Oral Daily  .  sodium chloride  3 mL Intravenous Q12H    Assessment/Plan:  1. COPD exacerbation, chronic respiratory failure on 2 L of oxygen. Continue Solu-Medrol 60 mg IV every 8 hours.  MRSA in the sputum-  Zyvox by mouth. Continue nebulizer treatments and budesonide nebulizers. Appreciate Pulmonary consultation.   2. Edema lower extremities-continue oral Lasix  3. Gastroesophageal reflux disease without esophagitis continue PPI 4. Essential hypertension- continue usual medications bp stable 5. Fibromyalgia and chronic pain- continue chronic pain medications 6. Headache-continue tyelonol 7. Acute cystitis without hematuria-urine culture growing bacteria however less than 100,000  8. Epistaxis and blood in the stool- hemoglobin stable  Code Status:     Code Status Orders        Start     Ordered   01/28/15 0808  Full code   Continuous     01/28/15 0808     Disposition Plan: Physical therapy recommends rehabilitation. Patient follows with hospice at home.  Antibiotics:  Zyvox  Time spent: 20 minutes  Francisco Ostrovsky, [facility]  [facility] Hospitalists

## 2015-02-07 MED ORDER — IPRATROPIUM-ALBUTEROL 0.5-2.5 (3) MG/3ML IN SOLN
3.0000 mL | RESPIRATORY_TRACT | Status: DC | PRN
  Administered 2015-02-07: 3 mL via RESPIRATORY_TRACT
  Filled 2015-02-07: qty 3

## 2015-02-07 NOTE — Progress Notes (Signed)
Dr. Marcille Blanco aware placing new orders

## 2015-02-07 NOTE — Progress Notes (Signed)
Patient ID: Loretta Wade, female   DOB: Mar 15, 1950, 64 y.o.   MRN: [id number]  [facility] PROGRESS NOTE  PCP: Brayton El, MD  HPI/Subjective: Dizziness improved still has shortness of breath Objective: Filed Vitals:   02/07/15 0836 02/07/15 1118  BP: 130/80 142/80  Pulse: 74 103  Temp: 97.9 F (36.6 C) 97.2 F (36.2 C)  Resp: 20 20    Filed Weights   01/28/15 0352 01/28/15 1033  Weight: 171 kg (376 lb 15.8 oz) 163.295 kg (360 lb)    ROS: Review of Systems  Constitutional: Negative for fever and chills.  HENT: Resolved is a bleed  Eyes: Negative for blurred vision.  Respiratory: Positive for cough, shortness of breath and wheezing.   Cardiovascular: Negative for chest pain.  Gastrointestinal: Negative for nausea, vomiting, abdominal pain, diarrhea, constipation and blood in stool.  Genitourinary: Negative for dysuria.  Musculoskeletal: Positive for myalgias. Negative for joint pain.  Neurological: Positive dizziness and headaches.   Exam: Physical Exam  Constitutional: She is oriented to person, place, and time.  HENT:  Nose: No mucosal edema.  Mouth/Throat: No oropharyngeal exudate or posterior oropharyngeal edema.  Eyes: Conjunctivae, EOM and lids are normal. Pupils are equal, round, and reactive to light.  Neck: No JVD present. Carotid bruit is not present. No edema present. No thyroid mass and no thyromegaly present.  Cardiovascular: S1 normal and S2 normal.  Exam reveals no gallop.   No murmur heard. Pulses:      Dorsalis pedis pulses are 2+ on the right side, and 2+ on the left side.  Respiratory: No accessory muscle usage. No respiratory distress. Diminished breath sounds bilaterally with occasional wheezing throughout both lungs She has no rales.  GI: Soft. Bowel sounds are normal. There is no tenderness.  Musculoskeletal:       Right ankle: She exhibits swelling.       Left ankle: She exhibits swelling.  Lymphadenopathy:    She has no  cervical adenopathy.  Neurological: She is alert and oriented to person, place, and time. No cranial nerve deficit.  Skin: Skin is warm. No rash noted. Nails show no clubbing.  Psychiatric: She has a normal mood and affect.    Data Reviewed: Basic Metabolic Panel:  Recent Labs Lab 02/01/15 0419 02/06/15 0344  NA 140 139  K 4.1 4.0  CL 102 99*  CO2 32 34*  GLUCOSE 162* 227*  BUN 23* 24*  CREATININE 0.75 0.85  CALCIUM 9.0 9.1  CBC:  Recent Labs Lab 02/01/15 0419  WBC 7.7  HGB 10.8*  HCT 34.3*  MCV 87.3  PLT 222      Recent Results (from the past 240 hour(s))  Culture, expectorated sputum-assessment     Status: None   Collection Time: 01/29/15  9:49 AM  Result Value Ref Range Status   Specimen Description EXPECTORATED SPUTUM  Final   Special Requests Normal  Final   Sputum evaluation   Final    Sputum specimen not acceptable for testing.  Please recollect.   Results Called to: [facility] 01/29/2015 1528 BY JRS.    Report Status 01/29/2015 FINAL  Final  Culture, expectorated sputum-assessment     Status: None   Collection Time: 01/30/15 12:22 PM  Result Value Ref Range Status   Specimen Description EXPECTORATED SPUTUM  Final   Special Requests NONE  Final   Sputum evaluation THIS SPECIMEN IS ACCEPTABLE FOR SPUTUM CULTURE  Final   Report Status 01/30/2015 FINAL  Final  Culture,  respiratory (NON-Expectorated)     Status: None   Collection Time: 01/30/15 12:22 PM  Result Value Ref Range Status   Specimen Description EXPECTORATED SPUTUM  Final   Special Requests NONE Reflexed from [id number]  Final   Gram Stain   Final    FEW WBC SEEN FEW GRAM POSITIVE COCCI IN CLUSTERS FEW YEAST GOOD SPECIMEN - 80-90% WBCS    Culture   Final    LIGHT GROWTH METHICILLIN RESISTANT STAPHYLOCOCCUS AUREUS CRITICAL RESULT CALLED TO, READ BACK BY AND VERIFIED WITH: APRIL RAST AT 1010 ON 02/02/15  CTJ    Report Status 02/03/2015 FINAL  Final   Organism ID, Bacteria METHICILLIN  RESISTANT STAPHYLOCOCCUS AUREUS  Final      Susceptibility   Methicillin resistant staphylococcus aureus - MIC*    CIPROFLOXACIN >=8 RESISTANT Resistant     GENTAMICIN <=0.5 SENSITIVE Sensitive     OXACILLIN >=4 RESISTANT Resistant     VANCOMYCIN 1 SENSITIVE Sensitive     TRIMETH/SULFA <=10 SENSITIVE Sensitive     CEFOXITIN SCREEN Value in next row Resistant      POSITIVECEFOXITIN SCREEN - This test may be used to predict mecA-mediated oxacillin resistance, and it is based on the cefoxitin disk screen test.  The cefoxitin screen and oxacillin work in combination to determine the final interpretation reported for oxacillin.     Inducible Clindamycin Value in next row Sensitive      POSITIVECEFOXITIN SCREEN - This test may be used to predict mecA-mediated oxacillin resistance, and it is based on the cefoxitin disk screen test.  The cefoxitin screen and oxacillin work in combination to determine the final interpretation reported for oxacillin.     ERYTHROMYCIN Value in next row Resistant      RESISTANT>=8    TETRACYCLINE Value in next row Sensitive      SENSITIVE<=1    CLINDAMYCIN Value in next row Resistant      RESISTANT>=8    LINEZOLID Value in next row Sensitive      SENSITIVE2    * LIGHT GROWTH METHICILLIN RESISTANT STAPHYLOCOCCUS AUREUS      Scheduled Meds: . albuterol  2.5 mg Nebulization TID  . budesonide (PULMICORT) nebulizer solution  0.25 mg Nebulization BID  . cholecalciferol  1,000 Units Oral Daily  . docusate sodium  100 mg Oral BID  . enoxaparin (LOVENOX) injection  40 mg Subcutaneous BID  . furosemide  40 mg Oral BID  . linezolid  600 mg Oral Q12H  . magnesium oxide  400 mg Oral Daily  . methylPREDNISolone (SOLU-MEDROL) injection  60 mg Intravenous Q8H  . morphine  15 mg Oral Q12H  . morphine  30 mg Oral Q12H  . nystatin  5 mL Oral QID  . oxybutynin  5 mg Oral Daily  . pantoprazole  40 mg Oral BID  . pregabalin  150 mg Oral BID  . rOPINIRole  0.75 mg Oral QHS  .  senna-docusate  2 tablet Oral Daily  . sodium chloride  3 mL Intravenous Q12H    Assessment/Plan:  1. COPD exacerbation, chronic respiratory failure on 2 L of oxygen. Change Solu-Medrol to prednisone  MRSA in the sputum-  Zyvox by mouth. Continue nebulizer treatments and budesonide nebulizers. Appreciate Pulmonary consultation.   2. Edema lower extremities-continue oral Lasix  3. Gastroesophageal reflux disease without esophagitis continue PPI 4. Essential hypertension- continue usual medications bp stable 5. Fibromyalgia and chronic pain- continue chronic pain medications 6. Headache-continue tyelonol 7. Acute cystitis without hematuria-urine culture growing bacteria  however less than 100,000  8. Epistaxis and blood in the stool- hemoglobin stable now resolved  Code Status:     Code Status Orders        Start     Ordered   01/28/15 0808  Full code   Continuous     01/28/15 0808     Disposition Plan: Physical therapy recommends rehabilitation. Patient follows with hospice at home. She wants to go home  Antibiotics:  Zyvox  Time spent: 20 minutes  Kiyoko Mcguirt, [facility]  [facility]

## 2015-02-07 NOTE — Progress Notes (Signed)
Patient complaints of SOB. Patient has expiratory wheezes. Resting in bed with labored breathing.Scheduled medication giving.  Prime Doc paged awaiting response.

## 2015-02-08 MED ORDER — PREDNISONE 10 MG (21) PO TBPK: 10.0000 mg | ORAL_TABLET | Freq: Every day | ORAL | Status: DC

## 2015-02-08 MED ORDER — FUROSEMIDE 40 MG PO TABS: 40.0000 mg | ORAL_TABLET | Freq: Two times a day (BID) | ORAL | Status: AC

## 2015-02-08 MED ORDER — LINEZOLID 600 MG PO TABS: 600.0000 mg | ORAL_TABLET | Freq: Two times a day (BID) | ORAL | Status: DC

## 2015-02-08 NOTE — Discharge Instructions (Signed)
°  DIET:  Cardiac diet  DISCHARGE CONDITION:  Fair  ACTIVITY:  Activity as tolerated  OXYGEN:  Home Oxygen: Yes.     Oxygen Delivery: 2 liters/min via Patient connected to nasal cannula oxygen  DISCHARGE LOCATION:  home    [facility] services   If you experience worsening of your admission symptoms, develop shortness of breath, life threatening emergency, suicidal or homicidal thoughts you must seek medical attention immediately by calling 911 or calling your MD immediately  if symptoms less severe.  You Must read complete instructions/literature along with all the possible adverse reactions/side effects for all the Medicines you take and that have been prescribed to you. Take any new Medicines after you have completely understood and accpet all the possible adverse reactions/side effects.   Please note  You were cared for by a hospitalist during your hospital stay. If you have any questions about your discharge medications or the care you received while you were in the hospital after you are discharged, you can call the unit and asked to speak with the hospitalist on call if the hospitalist that took care of you is not available. Once you are discharged, your primary care physician will handle any further medical issues. Please note that NO REFILLS for any discharge medications will be authorized once you are discharged, as it is imperative that you return to your primary care physician (or establish a relationship with a primary care physician if you do not have one) for your aftercare needs so that they can reassess your need for medications and monitor your lab values.

## 2015-02-08 NOTE — Discharge Planning (Addendum)
Pt IV removed. DC papers given, explained and educated.  Told of suggested FU appts and also told of scripts sent to pharm.  VSS and RN assessment revealed stability for DC to home with Hospice (Hospice aware of DC and to resume.  Pt being DC of 2L chronic O2 [place].  EMS has been called and will be transporting home once that arrive to hospital. ETA of EMS unknown at this time. All home meds and narcs returned, counted by pharm and RN. Pt signed pharm count sheet to confirm return.  Meds given to EMS to take home.

## 2015-02-08 NOTE — Care Management (Addendum)
Patient refused SNF. Patient plan to discharge to home today. Santiago Glad with [facility] hospice has been notified. No further RNCM needs. CSW will assist with EMS- Patient stated that "hospice told her they would take her because of my oxygen". I will follow up with Santiago Glad. List of PCS providers left with patient to discuss with her DSS worker.

## 2015-02-08 NOTE — Progress Notes (Signed)
Patient is discharging home with [place]/ [place] continuing to follow. Clinical Education officer, museum (CSW) prepared EMS form. RN Case Manager confirmed patient's address. RN will call EMS. [place] liaison is aware of above. Please reconsult if future social work needs arise. CSW signing off.   Blima Rich, [place] [phone]

## 2015-02-08 NOTE — Progress Notes (Signed)
Visit made. Plan is for patient to discharge this morning via EMS back to her home. She remains a a Full Code. Patient seen sitting up in bed, able to converse, she voiced understanding of discharge plan. Hospice team alerted to discharge. Discharge information faxed to triage. Thank you. Flo Shanks RN, BSN, [place] [facility] [place], [facility] [phone]

## 2015-02-08 NOTE — Discharge Summary (Signed)
Loretta Wade, 64 y.o., DOB 1950-12-21, MRN [id number]. Admission date: 01/28/2015 Discharge Date 02/08/2015 Primary MD Keith Rake, MD Admitting Physician Loletha Grayer, MD  Admission Diagnosis  COPD exacerbation [facility]) [J44.1]  Discharge Diagnosis   Active Problems:  Acute on chronic COPD exacerbation ([place]) Acute bronchitis with MRSA pneumonia GERD Essential hypertension Fibromyalgia Headache Acute cystitis epistaxis        Hospital Course Loretta Wade is a 64 y.o. female with a known history of advanced COPD and chronic respiratory failure. She presents with shortness of breath going on for a couple weeks getting worse and worse. Due to the symptoms patient was seen in the emergency room. And was admitted for acute respiratory failure due to acute COPD exasperation. She was also noticed to have MRSA in her sputum which was treated with Zyvox. Patient has chronic respiratory failure is on 2 L of oxygen which she is currently on. She is very fragile and was recommended to go to skilled nursing facility however patient refused. She wants to be discharged home with home hospice. At this time this is being arranged.            Consults  pulmonary/intensive care  Significant Tests:  See full reports for all details      Dg Chest 2 View  01/30/2015  CLINICAL DATA:  Cough, shortness of breath, wheezing EXAM: CHEST  2 VIEW COMPARISON:  01/28/2015 FINDINGS: Cardiomediastinal silhouette is stable. No acute infiltrate or pulmonary edema. Mild left basilar atelectasis. Linear atelectasis noted in lingula. Limited study by patient's large body habitus. IMPRESSION: No infiltrate or pulmonary edema. Linear atelectasis noted in lingula and left base. Electronically Signed   By: Lahoma Crocker M.D.   On: 01/30/2015 15:03   US Venous Img Lower Bilateral  02/01/2015  CLINICAL DATA:  Bilateral lower extremity pain for 5 days. EXAM: BILATERAL LOWER EXTREMITY VENOUS DOPPLER ULTRASOUND  TECHNIQUE: Gray-scale sonography with graded compression, as well as color Doppler and duplex ultrasound were performed to evaluate the lower extremity deep venous systems from the level of the common femoral vein and including the common femoral, femoral, profunda femoral, popliteal and calf veins including the posterior tibial, peroneal and gastrocnemius veins when visible. The superficial great saphenous vein was also interrogated. Spectral Doppler was utilized to evaluate flow at rest and with distal augmentation maneuvers in the common femoral, femoral and popliteal veins. COMPARISON:  Ultrasound of November 14, 2014. FINDINGS: RIGHT LOWER EXTREMITY Common Femoral Vein: No evidence of thrombus. Normal compressibility, respiratory phasicity and response to augmentation. Saphenofemoral Junction: No evidence of thrombus. Normal compressibility and flow on color Doppler imaging. Profunda Femoral Vein: No evidence of thrombus. Normal compressibility and flow on color Doppler imaging. Femoral Vein: No evidence of thrombus. Normal compressibility, respiratory phasicity and response to augmentation. Popliteal Vein: No evidence of thrombus. Normal compressibility, respiratory phasicity and response to augmentation. Calf Veins: No evidence of thrombus. Normal compressibility and flow on color Doppler imaging. Superficial Great Saphenous Vein: No evidence of thrombus. Normal compressibility and flow on color Doppler imaging. Venous Reflux:  None. Other Findings:  None. LEFT LOWER EXTREMITY Common Femoral Vein: No evidence of thrombus. Normal compressibility, respiratory phasicity and response to augmentation. Saphenofemoral Junction: No evidence of thrombus. Normal compressibility and flow on color Doppler imaging. Profunda Femoral Vein: No evidence of thrombus. Normal compressibility and flow on color Doppler imaging. Femoral Vein: No evidence of thrombus. Normal compressibility, respiratory phasicity and response to  augmentation. Popliteal Vein: No evidence of thrombus. Normal compressibility,  respiratory phasicity and response to augmentation. Calf Veins: No evidence of thrombus. Normal compressibility and flow on color Doppler imaging. Superficial Great Saphenous Vein: No evidence of thrombus. Normal compressibility and flow on color Doppler imaging. Venous Reflux:  None. Other Findings:  None. IMPRESSION: No evidence of deep venous thrombosis seen in either lower extremity. Electronically Signed   By: Marijo Conception, M.D.   On: 02/01/2015 10:37   Dg Chest Port 1 View  01/28/2015  CLINICAL DATA:  Acute onset of cough and shortness of breath. Generalized chest pain. Initial encounter. EXAM: PORTABLE CHEST 1 VIEW COMPARISON:  Chest radiograph performed 12/15/2014 FINDINGS: The lungs are well-aerated. Pulmonary vascularity is at the upper limits of normal. Minimal left basilar atelectasis is noted. There is no evidence of pleural effusion or pneumothorax. The cardiomediastinal silhouette is mildly enlarged. No acute osseous abnormalities are seen. IMPRESSION: Mild cardiomegaly.  Minimal left basilar atelectasis noted. Electronically Signed   By: Garald Balding M.D.   On: 01/28/2015 05:11       Today   Subjective:   Loretta Wade  feeling better still has intermittent shortness of breath  Objective:   Blood pressure 145/83, pulse 79, temperature 96.8 F (36 C), temperature source Oral, resp. rate 20, height 5\' 5"  (1.651 m), weight 163.295 kg (360 lb), SpO2 96 %.  .  Intake/Output Summary (Last 24 hours) at 02/08/15 1532 Last data filed at 02/08/15 0900  Gross per 24 hour  Intake    243 ml  Output      0 ml  Net    243 ml    Exam VITAL SIGNS: Blood pressure 145/83, pulse 79, temperature 96.8 F (36 C), temperature source Oral, resp. rate 20, height 5\' 5"  (1.651 m), weight 163.295 kg (360 lb), SpO2 96 %.  GENERAL:  64 y.o.-year-old patient lying in the bed with no acute distress.  EYES: Pupils  equal, round, reactive to light and accommodation. No scleral icterus. Extraocular muscles intact.  HEENT: Head atraumatic, normocephalic. Oropharynx and nasopharynx clear.  NECK:  Supple, no jugular venous distention. No thyroid enlargement, no tenderness.  LUNGS:Decreased breath sounds no sensory muscle usage  CARDIOVASCULAR: S1, S2 normal. No murmurs, rubs, or gallops.  ABDOMEN: Soft, nontender, nondistended. Bowel sounds present. No organomegaly or mass.  EXTREMITIES: No pedal edema, cyanosis, or clubbing.  NEUROLOGIC: Cranial nerves II through XII are intact. Muscle strength 5/5 in all extremities. Sensation intact. Gait not checked.  PSYCHIATRIC: The patient is alert and oriented x 3.  SKIN: No obvious rash, lesion, or ulcer.   Data Review     CBC w Diff: Lab Results  Component Value Date   WBC 7.7 02/01/2015   WBC 6.1 03/20/2014   HGB 10.8* 02/01/2015   HGB 12.2 03/20/2014   HCT 34.3* 02/01/2015   HCT 39.2 03/20/2014   PLT 222 02/01/2015   PLT 185 03/20/2014   LYMPHOPCT 30 01/28/2015   LYMPHOPCT 44.9 08/04/2013   MONOPCT 16 01/28/2015   MONOPCT 15.0 08/04/2013   EOSPCT 3 01/28/2015   EOSPCT 3.0 08/04/2013   BASOPCT 1 01/28/2015   BASOPCT 1.1 08/04/2013   CMP: Lab Results  Component Value Date   NA 139 02/06/2015   NA 142 03/20/2014   K 4.0 02/06/2015   K 4.2 03/20/2014   CL 99* 02/06/2015   CL 106 03/20/2014   CO2 34* 02/06/2015   CO2 31 03/20/2014   BUN 24* 02/06/2015   BUN 13 03/20/2014   CREATININE 0.85 02/06/2015  CREATININE 1.13 03/20/2014   PROT 6.6 01/28/2015   PROT 7.0 03/20/2014   ALBUMIN 3.5 01/28/2015   ALBUMIN 3.0* 03/20/2014   BILITOT 0.5 01/28/2015   BILITOT 0.5 03/20/2014   ALKPHOS 77 01/28/2015   ALKPHOS 121* 03/20/2014   AST 17 01/28/2015   AST 31 03/20/2014   ALT 13* 01/28/2015   ALT 42 03/20/2014  .  Micro Results Recent Results (from the past 240 hour(s))  Culture, expectorated sputum-assessment     Status: None   Collection  Time: 01/30/15 12:22 PM  Result Value Ref Range Status   Specimen Description EXPECTORATED SPUTUM  Final   Special Requests NONE  Final   Sputum evaluation THIS SPECIMEN IS ACCEPTABLE FOR SPUTUM CULTURE  Final   Report Status 01/30/2015 FINAL  Final  Culture, respiratory (NON-Expectorated)     Status: None   Collection Time: 01/30/15 12:22 PM  Result Value Ref Range Status   Specimen Description EXPECTORATED SPUTUM  Final   Special Requests NONE Reflexed from [id number]  Final   Gram Stain   Final    FEW WBC SEEN FEW GRAM POSITIVE COCCI IN CLUSTERS FEW YEAST GOOD SPECIMEN - 80-90% WBCS    Culture   Final    LIGHT GROWTH METHICILLIN RESISTANT STAPHYLOCOCCUS AUREUS CRITICAL RESULT CALLED TO, READ BACK BY AND VERIFIED WITH: APRIL RAST AT 1010 ON 02/02/15  CTJ    Report Status 02/03/2015 FINAL  Final   Organism ID, Bacteria METHICILLIN RESISTANT STAPHYLOCOCCUS AUREUS  Final      Susceptibility   Methicillin resistant staphylococcus aureus - MIC*    CIPROFLOXACIN >=8 RESISTANT Resistant     GENTAMICIN <=0.5 SENSITIVE Sensitive     OXACILLIN >=4 RESISTANT Resistant     VANCOMYCIN 1 SENSITIVE Sensitive     TRIMETH/SULFA <=10 SENSITIVE Sensitive     CEFOXITIN SCREEN Value in next row Resistant      POSITIVECEFOXITIN SCREEN - This test may be used to predict mecA-mediated oxacillin resistance, and it is based on the cefoxitin disk screen test.  The cefoxitin screen and oxacillin work in combination to determine the final interpretation reported for oxacillin.     Inducible Clindamycin Value in next row Sensitive      POSITIVECEFOXITIN SCREEN - This test may be used to predict mecA-mediated oxacillin resistance, and it is based on the cefoxitin disk screen test.  The cefoxitin screen and oxacillin work in combination to determine the final interpretation reported for oxacillin.     ERYTHROMYCIN Value in next row Resistant      RESISTANT>=8    TETRACYCLINE Value in next row Sensitive       SENSITIVE<=1    CLINDAMYCIN Value in next row Resistant      RESISTANT>=8    LINEZOLID Value in next row Sensitive      SENSITIVE2    * LIGHT GROWTH METHICILLIN RESISTANT STAPHYLOCOCCUS AUREUS           Follow-up Information    Follow up with Keith Rake, MD In 7 days.   Specialty:  Family Medicine   Contact information:   [address] [place] [address] [phone]       Discharge Medications     Medication List    STOP taking these medications        azithromycin 500 MG tablet  Commonly known as:  ZITHROMAX     doxycycline 100 MG capsule  Commonly known as:  VIBRAMYCIN     predniSONE 20 MG tablet  Commonly  known as:  DELTASONE  Replaced by:  predniSONE 10 MG (21) Tbpk tablet      TAKE these medications        acetaminophen 325 MG tablet  Commonly known as:  TYLENOL  Take 2 tablets (650 mg total) by mouth every 6 (six) hours as needed for mild pain (or Fever >/= 101).     albuterol (2.5 MG/3ML) 0.083% nebulizer solution  Commonly known as:  PROVENTIL  Take 3 mLs (2.5 mg total) by nebulization every 6 (six) hours as needed for wheezing or shortness of breath.     cholecalciferol 1000 UNITS tablet  Commonly known as:  VITAMIN D  Take 1,000 Units by mouth daily.     CORICIDIN HBP COLD/FLU PO  Take 2 tablets by mouth daily as needed (for nasal congestion/cough).     DEXILANT 60 MG capsule  Generic drug:  dexlansoprazole  Take 60 mg by mouth daily.     docusate sodium 50 MG capsule  Commonly known as:  COLACE  Take 100 mg by mouth daily.     Fluticasone Furoate-Vilanterol 100-25 MCG/INH Aepb  Inhale 1 puff into the lungs 2 (two) times daily.     furosemide 20 MG tablet  Commonly known as:  LASIX  Take 1 tablet (20 mg total) by mouth every other day.     furosemide 40 MG tablet  Commonly known as:  LASIX  Take 1 tablet (40 mg total) by mouth 2 (two) times daily.     guaiFENesin-dextromethorphan 100-10 MG/5ML syrup  Commonly  known as:  ROBITUSSIN DM  Take 10 mLs by mouth every 4 (four) hours as needed for cough.     HYDROcodone-acetaminophen 5-325 MG tablet  Commonly known as:  NORCO/VICODIN  Take 1-2 tablets by mouth every 4 (four) hours as needed for moderate pain.     ipratropium-albuterol 0.5-2.5 (3) MG/3ML Soln  Commonly known as:  DUONEB  Take 3 mLs by nebulization every 6 (six) hours.     linezolid 600 MG tablet  Commonly known as:  ZYVOX  Take 1 tablet (600 mg total) by mouth every 12 (twelve) hours.     Magnesium Oxide 250 MG Tabs  Take 250 mg by mouth daily.     methocarbamol 500 MG tablet  Commonly known as:  ROBAXIN  Take 500 mg by mouth every 6 (six) hours as needed for muscle spasms.     morphine 15 MG 12 hr tablet  Commonly known as:  MS CONTIN  Take 15 mg by mouth every 12 (twelve) hours.     morphine 30 MG 12 hr tablet  Commonly known as:  MS CONTIN  Take 30 mg by mouth every 12 (twelve) hours.     morphine 10 MG/5ML solution  Take 5 mg by mouth every 2 (two) hours as needed for severe pain (For Dyspnea).     oxybutynin 5 MG tablet  Commonly known as:  DITROPAN  Take 1 tablet (5 mg total) by mouth daily.     pantoprazole 40 MG tablet  Commonly known as:  PROTONIX  Take 1 tablet (40 mg total) by mouth 2 (two) times daily.     predniSONE 10 MG (21) Tbpk tablet  Commonly known as:  STERAPRED UNI-PAK 21 TAB  Take 1 tablet (10 mg total) by mouth daily. Start with 50mg  daily, taper by 10mg  each day then stop     pregabalin 150 MG capsule  Commonly known as:  LYRICA  Take 150 mg by  mouth 2 (two) times daily.     propranolol 80 MG tablet  Commonly known as:  INDERAL  Take 80 mg by mouth daily.     ranitidine 150 MG tablet  Commonly known as:  ZANTAC  Take 150 mg by mouth 3 (three) times daily as needed for heartburn.     rOPINIRole 0.25 MG tablet  Commonly known as:  REQUIP  Take 3 tablets (0.75 mg total) by mouth at bedtime.     senna-docusate 8.6-50 MG tablet   Commonly known as:  Senokot-S  Take 2 tablets by mouth daily.           Total Time in preparing paper work, data evaluation and todays exam - 35 minutes  Dustin Flock M.D on 02/08/2015 at 3:32 PM  [facility]   Office  [phone]

## 2015-02-22 ENCOUNTER — Telehealth: Payer: Self-pay | Admitting: Family Medicine

## 2015-02-22 NOTE — Telephone Encounter (Signed)
Left voicemail for patient to contact Hospice nurse for her medication, she is currently under care of Hospice, medication refill has been denied by [name]

## 2015-02-22 NOTE — Telephone Encounter (Signed)
Pt said that she is needing refillon lyrica but pharm said that it is being denied. Please advise pt

## 2015-03-16 ENCOUNTER — Ambulatory Visit: Payer: Medicare PPO | Admitting: Family Medicine

## 2015-03-17 ENCOUNTER — Ambulatory Visit: Payer: Medicare PPO | Admitting: Family Medicine

## 2015-03-23 ENCOUNTER — Ambulatory Visit (INDEPENDENT_AMBULATORY_CARE_PROVIDER_SITE_OTHER): Admitting: Family Medicine

## 2015-03-23 ENCOUNTER — Encounter: Payer: Self-pay | Admitting: Family Medicine

## 2015-03-23 VITALS — BP 130/60 | HR 79 | Temp 98.9°F | Resp 20 | Ht 65.0 in

## 2015-03-23 DIAGNOSIS — K219 Gastro-esophageal reflux disease without esophagitis: Secondary | ICD-10-CM | POA: Diagnosis not present

## 2015-03-23 DIAGNOSIS — J449 Chronic obstructive pulmonary disease, unspecified: Secondary | ICD-10-CM

## 2015-03-23 DIAGNOSIS — M797 Fibromyalgia: Secondary | ICD-10-CM | POA: Insufficient documentation

## 2015-03-23 DIAGNOSIS — J441 Chronic obstructive pulmonary disease with (acute) exacerbation: Secondary | ICD-10-CM

## 2015-03-23 MED ORDER — HYDROCODONE-ACETAMINOPHEN 5-325 MG PO TABS: 1.0000 | ORAL_TABLET | Freq: Three times a day (TID) | ORAL | Status: DC | PRN

## 2015-03-23 MED ORDER — PREGABALIN 150 MG PO CAPS: 150.0000 mg | ORAL_CAPSULE | Freq: Two times a day (BID) | ORAL | Status: DC

## 2015-03-23 MED ORDER — DEXLANSOPRAZOLE 60 MG PO CPDR: 60.0000 mg | DELAYED_RELEASE_CAPSULE | Freq: Every day | ORAL | Status: DC

## 2015-03-23 NOTE — Progress Notes (Signed)
Name: Loretta Wade   MRN: [id number]    DOB: 14-Dec-1950   Date:03/23/2015       Progress Note  Subjective  Chief Complaint  Chief Complaint  Patient presents with  . Follow-up    patient is no longer a candidate for hospice    Gastroesophageal Reflux She complains of abdominal pain and heartburn. She reports no belching, no chest pain, no coughing or no nausea. This is a chronic problem. The problem has been unchanged. Pertinent negatives include no weight loss. Risk factors include obesity. She has tried a PPI and a histamine-2 antagonist for the symptoms. The treatment provided moderate relief.   Fibromyalgia Pt. Is here for follow up and medication refill. She was on Lyrica 150 mg twice daily which was changed to Gabapentin 300mg  three times daily while she was being seen by [facility] physician (mainly  Because of cost). Pt. Believes Gabapentin is not as effective as Lyrica and has not taken any in over a week. Requesting to be started back on Lyrica. In addition, since her stay at the Hospice, she has been started on Morphine for chronic pain and to also help with 'breathing'. Similarly, pt. Is now on Hydrocodone-Acetaminophen 5-325 mg every 8 hours as needed for pain. Records from Hospice including D/C summary are not available as are the Hospice directors notes. Hospital D/C Summary reviewed and discussed with pt. In detail. In addition, pt.'s nurse and social worker also requesting for Home Health PT in the home.   Past Medical History  Diagnosis Date  . OA (osteoarthritis)   . Fibromyalgia   . Anemia   . Sleep apnea   . Fatigue   . Edema   . Esophageal reflux   . Nocturia   . Hypertension   . COPD (chronic obstructive pulmonary disease) [facility])     Past Surgical History  Procedure Laterality Date  . Cholecystectomy    . Total knee arthroplasty      right  . Vesicovaginal fistula closure w/ tah    . Abdominal hysterectomy      Family History  Problem Relation Age  of Onset  . Hypertension Mother   . Diabetes    . Breast cancer Daughter     Social History   Social History  . Marital Status: Single    Spouse Name: N/A  . Number of Children: N/A  . Years of Education: N/A   Occupational History  . Not on file.   Social History Main Topics  . Smoking status: Former Smoker    Quit date: 11/04/1983  . Smokeless tobacco: Not on file  . Alcohol Use: No  . Drug Use: No  . Sexual Activity: Not on file   Other Topics Concern  . Not on file   Social History Narrative     Current outpatient prescriptions:  .  acetaminophen (TYLENOL) 325 MG tablet, Take 2 tablets (650 mg total) by mouth every 6 (six) hours as needed for mild pain (or Fever >/= 101)., Disp: 30 tablet, Rfl: 0 .  albuterol (PROVENTIL) (2.5 MG/3ML) 0.083% nebulizer solution, Take 3 mLs (2.5 mg total) by nebulization every 6 (six) hours as needed for wheezing or shortness of breath., Disp: 75 mL, Rfl: 12 .  budesonide (PULMICORT) 0.5 MG/2ML nebulizer solution, , Disp: , Rfl:  .  Chlorpheniramine-Acetaminophen (CORICIDIN HBP COLD/FLU PO), Take 2 tablets by mouth daily as needed (for nasal congestion/cough). , Disp: , Rfl:  .  cholecalciferol (VITAMIN D) 1000 UNITS tablet, Take  1,000 Units by mouth daily. , Disp: , Rfl:  .  citalopram (CELEXA) 20 MG tablet, Take 20 mg by mouth daily., Disp: , Rfl:  .  dexlansoprazole (DEXILANT) 60 MG capsule, Take 60 mg by mouth daily., Disp: , Rfl:  .  docusate sodium (COLACE) 50 MG capsule, Take 100 mg by mouth daily., Disp: , Rfl:  .  Fluticasone Furoate-Vilanterol 100-25 MCG/INH AEPB, Inhale 1 puff into the lungs 2 (two) times daily. , Disp: , Rfl:  .  furosemide (LASIX) 20 MG tablet, Take 1 tablet (20 mg total) by mouth every other day., Disp: 30 tablet, Rfl: 0 .  furosemide (LASIX) 40 MG tablet, Take 1 tablet (40 mg total) by mouth 2 (two) times daily., Disp: 30 tablet, Rfl: 60 .  gabapentin (NEURONTIN) 300 MG capsule, Take 300 mg by mouth 3  (three) times daily., Disp: , Rfl:  .  guaiFENesin-dextromethorphan (ROBITUSSIN DM) 100-10 MG/5ML syrup, Take 10 mLs by mouth every 4 (four) hours as needed for cough., Disp: , Rfl:  .  HYDROcodone-acetaminophen (NORCO/VICODIN) 5-325 MG per tablet, Take 1-2 tablets by mouth every 4 (four) hours as needed for moderate pain. (Patient taking differently: Take 1-2 tablets by mouth every 8 (eight) hours as needed for moderate pain. ), Disp: 20 tablet, Rfl: 0 .  ipratropium-albuterol (DUONEB) 0.5-2.5 (3) MG/3ML SOLN, Take 3 mLs by nebulization every 6 (six) hours., Disp: 360 mL, Rfl: 0 .  linezolid (ZYVOX) 600 MG tablet, Take 1 tablet (600 mg total) by mouth every 12 (twelve) hours., Disp: 8 tablet, Rfl: 0 .  Magnesium Oxide 250 MG TABS, Take 250 mg by mouth daily., Disp: , Rfl:  .  methocarbamol (ROBAXIN) 500 MG tablet, Take 500 mg by mouth every 6 (six) hours as needed for muscle spasms., Disp: , Rfl:  .  morphine (MS CONTIN) 15 MG 12 hr tablet, Take 15 mg by mouth every 12 (twelve) hours., Disp: , Rfl:  .  morphine (MS CONTIN) 30 MG 12 hr tablet, Take 30 mg by mouth every 12 (twelve) hours., Disp: , Rfl:  .  morphine 10 MG/5ML solution, Take 5 mg by mouth every 2 (two) hours as needed for severe pain (For Dyspnea)., Disp: , Rfl:  .  oxybutynin (DITROPAN) 5 MG tablet, Take 1 tablet (5 mg total) by mouth daily., Disp: 30 tablet, Rfl: 0 .  pantoprazole (PROTONIX) 40 MG tablet, Take 1 tablet (40 mg total) by mouth 2 (two) times daily., Disp: 60 tablet, Rfl: 0 .  predniSONE (STERAPRED UNI-PAK 21 TAB) 10 MG (21) TBPK tablet, Take 1 tablet (10 mg total) by mouth daily. Start with 50mg  daily, taper by 10mg  each day then stop, Disp: 61 tablet, Rfl: 0 .  pregabalin (LYRICA) 150 MG capsule, Take 150 mg by mouth 2 (two) times daily., Disp: , Rfl:  .  propranolol (INDERAL) 80 MG tablet, Take 80 mg by mouth daily., Disp: , Rfl:  .  RA TUSSIN CGH/CHEST CONGEST DM 100-10 MG/5ML liquid, take 5 milliliters by mouth every  4 hours if needed, Disp: , Rfl: 0 .  ranitidine (ZANTAC) 150 MG tablet, Take 150 mg by mouth 3 (three) times daily as needed for heartburn., Disp: , Rfl:  .  rOPINIRole (REQUIP) 0.25 MG tablet, Take 3 tablets (0.75 mg total) by mouth at bedtime., Disp: 90 tablet, Rfl: 2 .  senna-docusate (SENOKOT-S) 8.6-50 MG per tablet, Take 2 tablets by mouth daily., Disp: 60 tablet, Rfl: 0  No Known Allergies   Review of Systems  Constitutional: Negative for fever, chills and weight loss.  Respiratory: Positive for shortness of breath. Negative for cough.   Cardiovascular: Negative for chest pain.  Gastrointestinal: Positive for heartburn and abdominal pain. Negative for nausea.  Musculoskeletal: Positive for back pain.    Objective  Filed Vitals:   03/23/15 1341  BP: 130/60  Pulse: 79  Temp: 98.9 F (37.2 C)  TempSrc: Oral  Resp: 20  Height: 5\' 5"  (1.651 m)  SpO2: 90%    Physical Exam  Constitutional: She is oriented to person, place, and time and well-developed, well-nourished, and in no distress.  Morbidly obese female, sitting in wheelchair  Cardiovascular: Normal rate, regular rhythm and normal heart sounds.   Pulmonary/Chest: Effort normal and breath sounds normal. No respiratory distress. She has no wheezes. She has no rhonchi.  Abdominal: Soft. Bowel sounds are normal.  Neurological: She is alert and oriented to person, place, and time.  Nursing note and vitals reviewed.    Assessment & Plan  1. COPD, frequent exacerbations (HCC) DC summary from Hospital reviewed. - Ambulatory referral to Physical Therapy  2. Fibromyalgia Refills to switch patient from gabapentin to Lyrica. However, I explained to patient that I'm not comfortable resuming MS Contin, which was ordered by the physician at hospice. I offered patient a referral to pain clinic, which she declined. We'll continue on hydrocodone to be taken as needed and follow-up in one month. - pregabalin (LYRICA) 150 MG  capsule; Take 1 capsule (150 mg total) by mouth 2 (two) times daily.  Dispense: 60 capsule; Refill: 0 - HYDROcodone-acetaminophen (NORCO/VICODIN) 5-325 MG tablet; Take 1 tablet by mouth every 8 (eight) hours as needed for moderate pain.  Dispense: 90 tablet; Refill: 0  3. Gastroesophageal reflux disease, esophagitis presence not specified Stable. - dexlansoprazole (DEXILANT) 60 MG capsule; Take 1 capsule (60 mg total) by mouth daily.  Dispense: 90 capsule; Refill: 0   Kimberl Vig [name] 03/23/2015 2:24 PM

## 2015-03-25 ENCOUNTER — Telehealth: Payer: Self-pay

## 2015-03-25 NOTE — Telephone Encounter (Signed)
Patient called stating she is no longer with Hospice and in order for her to continue her physical therapy she needs a referral.   Please call patient.

## 2015-03-26 ENCOUNTER — Telehealth: Payer: Self-pay | Admitting: Family Medicine

## 2015-03-26 NOTE — Telephone Encounter (Signed)
Pt states her referral for Hospice was sent to [facility] and should have been sent to [place]. Please advise pt., routed to [name] for referral order

## 2015-03-26 NOTE — Telephone Encounter (Signed)
Pt states her referral for Hospice  was sent to [facility] and should have been sent to [place]. Please advise pt.

## 2015-03-29 ENCOUNTER — Telehealth: Payer: Self-pay | Admitting: Family Medicine

## 2015-03-29 ENCOUNTER — Telehealth: Payer: Self-pay

## 2015-03-29 MED ORDER — FIRST-DUKES MOUTHWASH MT SUSP: 1.0000 | Freq: Two times a day (BID) | OROMUCOSAL | Status: DC

## 2015-03-29 NOTE — Telephone Encounter (Signed)
Was just prescribed [facility] MouthWash and the pharmacy told her that it will cost $20. Would like to know if something less expensive could be called in to her pharmacy.

## 2015-03-29 NOTE — Telephone Encounter (Signed)
Medication has been sent to [facility]

## 2015-03-30 ENCOUNTER — Telehealth: Payer: Self-pay | Admitting: Family Medicine

## 2015-03-30 NOTE — Telephone Encounter (Signed)
Referral for physical therapy has been completed

## 2015-03-30 NOTE — Telephone Encounter (Signed)
SAYS THAT [facility] IS GOING OUT TODAY TO ASSIST THE PATIENT.

## 2015-03-31 ENCOUNTER — Telehealth: Payer: Self-pay | Admitting: Family Medicine

## 2015-03-31 NOTE — Telephone Encounter (Signed)
errenous °

## 2015-03-31 NOTE — Telephone Encounter (Signed)
Pt would like a call back

## 2015-04-01 ENCOUNTER — Encounter: Payer: Self-pay | Admitting: Family Medicine

## 2015-04-01 NOTE — Telephone Encounter (Signed)
Spoke with patient and medication has been sent to [facility]

## 2015-04-01 NOTE — Telephone Encounter (Signed)
Spoke with patient and medication has been sent to [facility]

## 2015-04-01 NOTE — Telephone Encounter (Signed)
Spoke with patient and medication has been sent to [facility]

## 2015-04-02 ENCOUNTER — Telehealth: Payer: Self-pay | Admitting: Family Medicine

## 2015-04-02 ENCOUNTER — Telehealth: Payer: Self-pay

## 2015-04-02 MED ORDER — IPRATROPIUM-ALBUTEROL 0.5-2.5 (3) MG/3ML IN SOLN: 3.0000 mL | Freq: Four times a day (QID) | RESPIRATORY_TRACT | Status: DC

## 2015-04-02 NOTE — Telephone Encounter (Signed)
Medication has been refilled and sent to [facility]

## 2015-04-05 ENCOUNTER — Ambulatory Visit: Payer: Medicare PPO | Admitting: Family Medicine

## 2015-04-06 ENCOUNTER — Telehealth: Payer: Self-pay | Admitting: Family Medicine

## 2015-04-06 NOTE — Telephone Encounter (Signed)
Spoke with patient and we will speak with hospice on 04/07/2015 to access situation

## 2015-04-06 NOTE — Telephone Encounter (Signed)
Patient is requesting that you please refer her to Hospice due to her not being able to get any pain meds. She is requesting a return call from [facility]. Also Debra from [facility] will be faxing a form for Dr Manuella Ghazi, if he is in agreement please sign the form and fax it back to her. Thank you.

## 2015-04-08 ENCOUNTER — Telehealth: Payer: Self-pay | Admitting: Family Medicine

## 2015-04-08 ENCOUNTER — Other Ambulatory Visit: Payer: Self-pay

## 2015-04-08 NOTE — Telephone Encounter (Signed)
Patient is requesting return call pertaining to a form that was faxed to [facility]. Stating something was incorrect on the form

## 2015-04-08 NOTE — Telephone Encounter (Signed)
Routed to Dr. Shah for approval 

## 2015-04-08 NOTE — Telephone Encounter (Signed)
Spoke formed her that will not be prescribing MS Contin for her, offered to place a referral to pain management for MS Contin. Also discussed home health and will review the paperwork faxed to our office by the agency. Patient also wishes to have assistance with ADLs such as bathing. We will review the paperwork and contact the personnel at the home health agency.

## 2015-04-08 NOTE — Telephone Encounter (Signed)
Patients social worker called patient needs refills [phone] Loretta Wade.  Also needs a home health order for Home care providers, they will be faxing an order.  Please return call

## 2015-04-08 NOTE — Telephone Encounter (Signed)
Returned call and left a voice message for [employer]. As I had discussed with patient during her office visit, I will not fill prescription for MS Contin. She will need a referral to a specialist in pain management for the said prescription

## 2015-04-12 ENCOUNTER — Inpatient Hospital Stay
Admission: EM | Admit: 2015-04-12 | Discharge: 2015-04-15 | DRG: 191 | Disposition: A | Discharge status: Home Health | Payer: Medicare PPO | Attending: Internal Medicine | Admitting: Internal Medicine

## 2015-04-12 ENCOUNTER — Encounter: Payer: Self-pay | Admitting: Family Medicine

## 2015-04-12 ENCOUNTER — Emergency Department: Payer: Medicare PPO

## 2015-04-12 ENCOUNTER — Ambulatory Visit (INDEPENDENT_AMBULATORY_CARE_PROVIDER_SITE_OTHER): Payer: Medicare PPO | Admitting: Family Medicine

## 2015-04-12 ENCOUNTER — Encounter: Payer: Self-pay | Admitting: Emergency Medicine

## 2015-04-12 VITALS — BP 138/80 | HR 88 | Temp 98.5°F | Resp 16 | Ht 65.0 in | Wt 344.0 lb

## 2015-04-12 DIAGNOSIS — E876 Hypokalemia: Secondary | ICD-10-CM | POA: Diagnosis present

## 2015-04-12 DIAGNOSIS — R197 Diarrhea, unspecified: Secondary | ICD-10-CM | POA: Diagnosis present

## 2015-04-12 DIAGNOSIS — G4733 Obstructive sleep apnea (adult) (pediatric): Secondary | ICD-10-CM | POA: Diagnosis present

## 2015-04-12 DIAGNOSIS — J9610 Chronic respiratory failure, unspecified whether with hypoxia or hypercapnia: Secondary | ICD-10-CM | POA: Diagnosis present

## 2015-04-12 DIAGNOSIS — Z9981 Dependence on supplemental oxygen: Secondary | ICD-10-CM

## 2015-04-12 DIAGNOSIS — Z833 Family history of diabetes mellitus: Secondary | ICD-10-CM

## 2015-04-12 DIAGNOSIS — D649 Anemia, unspecified: Secondary | ICD-10-CM | POA: Diagnosis present

## 2015-04-12 DIAGNOSIS — M1990 Unspecified osteoarthritis, unspecified site: Secondary | ICD-10-CM | POA: Diagnosis present

## 2015-04-12 DIAGNOSIS — R079 Chest pain, unspecified: Secondary | ICD-10-CM

## 2015-04-12 DIAGNOSIS — Z7409 Other reduced mobility: Secondary | ICD-10-CM | POA: Insufficient documentation

## 2015-04-12 DIAGNOSIS — Z79891 Long term (current) use of opiate analgesic: Secondary | ICD-10-CM | POA: Diagnosis not present

## 2015-04-12 DIAGNOSIS — G8929 Other chronic pain: Secondary | ICD-10-CM | POA: Diagnosis present

## 2015-04-12 DIAGNOSIS — Z96651 Presence of right artificial knee joint: Secondary | ICD-10-CM | POA: Diagnosis present

## 2015-04-12 DIAGNOSIS — J40 Bronchitis, not specified as acute or chronic: Secondary | ICD-10-CM | POA: Diagnosis not present

## 2015-04-12 DIAGNOSIS — Z803 Family history of malignant neoplasm of breast: Secondary | ICD-10-CM | POA: Diagnosis not present

## 2015-04-12 DIAGNOSIS — I10 Essential (primary) hypertension: Secondary | ICD-10-CM | POA: Diagnosis present

## 2015-04-12 DIAGNOSIS — M797 Fibromyalgia: Secondary | ICD-10-CM | POA: Diagnosis present

## 2015-04-12 DIAGNOSIS — K219 Gastro-esophageal reflux disease without esophagitis: Secondary | ICD-10-CM | POA: Diagnosis present

## 2015-04-12 DIAGNOSIS — J449 Chronic obstructive pulmonary disease, unspecified: Secondary | ICD-10-CM | POA: Diagnosis not present

## 2015-04-12 DIAGNOSIS — R109 Unspecified abdominal pain: Secondary | ICD-10-CM | POA: Diagnosis present

## 2015-04-12 DIAGNOSIS — Z87891 Personal history of nicotine dependence: Secondary | ICD-10-CM

## 2015-04-12 DIAGNOSIS — J441 Chronic obstructive pulmonary disease with (acute) exacerbation: Secondary | ICD-10-CM

## 2015-04-12 DIAGNOSIS — K210 Gastro-esophageal reflux disease with esophagitis: Secondary | ICD-10-CM | POA: Diagnosis present

## 2015-04-12 DIAGNOSIS — Z6841 Body Mass Index (BMI) 40.0 and over, adult: Secondary | ICD-10-CM | POA: Diagnosis not present

## 2015-04-12 DIAGNOSIS — Z8249 Family history of ischemic heart disease and other diseases of the circulatory system: Secondary | ICD-10-CM

## 2015-04-12 DIAGNOSIS — Z79899 Other long term (current) drug therapy: Secondary | ICD-10-CM | POA: Diagnosis not present

## 2015-04-12 LAB — URINALYSIS COMPLETE WITH MICROSCOPIC (ARMC ONLY)
BILIRUBIN URINE: NEGATIVE
Bacteria, UA: NONE SEEN
GLUCOSE, UA: NEGATIVE mg/dL
Hgb urine dipstick: NEGATIVE
Leukocytes, UA: NEGATIVE
Nitrite: NEGATIVE
PROTEIN: 30 mg/dL — AB
Specific Gravity, Urine: 1.026 (ref 1.005–1.030)
pH: 8 (ref 5.0–8.0)

## 2015-04-12 LAB — TROPONIN I: Troponin I: 0.03 ng/mL (ref <0.031)

## 2015-04-12 LAB — BASIC METABOLIC PANEL
ANION GAP: 7 (ref 5–15)
BUN: 6 mg/dL (ref 6–20)
CHLORIDE: 105 mmol/L (ref 101–111)
CO2: 30 mmol/L (ref 22–32)
CREATININE: 0.58 mg/dL (ref 0.44–1.00)
Calcium: 9.2 mg/dL (ref 8.9–10.3)
GFR calc non Af Amer: >60 mL/min (ref >60)
Glucose, Bld: 103 mg/dL — ABNORMAL HIGH (ref 65–99)
POTASSIUM: 3.3 mmol/L — AB (ref 3.5–5.1)
SODIUM: 142 mmol/L (ref 135–145)

## 2015-04-12 LAB — CBC
HEMATOCRIT: 33.4 % — AB (ref 35.0–47.0)
HEMOGLOBIN: 10.7 g/dL — AB (ref 12.0–16.0)
MCH: 27.6 pg (ref 26.0–34.0)
MCHC: 32.1 g/dL (ref 32.0–36.0)
MCV: 85.9 fL (ref 80.0–100.0)
PLATELETS: 239 10*3/uL (ref 150–440)
RBC: 3.88 MIL/uL (ref 3.80–5.20)
RDW: 20.1 % — ABNORMAL HIGH (ref 11.5–14.5)
WBC: 5.3 10*3/uL (ref 3.6–11.0)

## 2015-04-12 LAB — MAGNESIUM: MAGNESIUM: 1.9 mg/dL (ref 1.7–2.4)

## 2015-04-12 MED ORDER — AZITHROMYCIN 250 MG PO TABS: ORAL_TABLET | ORAL | Status: DC

## 2015-04-12 MED ORDER — METHYLPREDNISOLONE SODIUM SUCC 125 MG IJ SOLR
60.0000 mg | Freq: Every day | INTRAMUSCULAR | Status: DC
  Administered 2015-04-12 – 2015-04-14 (×3): 60 mg via INTRAVENOUS
  Filled 2015-04-12 (×4): qty 2

## 2015-04-12 MED ORDER — VITAMIN D 1000 UNITS PO TABS
1000.0000 [IU] | ORAL_TABLET | Freq: Every day | ORAL | Status: DC
  Administered 2015-04-12 – 2015-04-15 (×4): 1000 [IU] via ORAL
  Filled 2015-04-12 (×4): qty 1

## 2015-04-12 MED ORDER — RISPERIDONE 0.5 MG PO TABS
0.7500 mg | ORAL_TABLET | Freq: Every day | ORAL | Status: DC
  Filled 2015-04-12: qty 1.5
  Filled 2015-04-12: qty 3

## 2015-04-12 MED ORDER — HYDROCODONE-ACETAMINOPHEN 5-325 MG PO TABS
1.0000 | ORAL_TABLET | Freq: Three times a day (TID) | ORAL | Status: DC | PRN
  Administered 2015-04-13 – 2015-04-15 (×5): 2 via ORAL
  Filled 2015-04-12 (×5): qty 2

## 2015-04-12 MED ORDER — POTASSIUM CHLORIDE CRYS ER 20 MEQ PO TBCR
40.0000 meq | EXTENDED_RELEASE_TABLET | Freq: Once | ORAL | Status: AC
  Administered 2015-04-12: 40 meq via ORAL
  Filled 2015-04-12: qty 2

## 2015-04-12 MED ORDER — ROPINIROLE HCL 0.5 MG PO TABS
0.7500 mg | ORAL_TABLET | Freq: Every day | ORAL | Status: DC
  Filled 2015-04-12: qty 1

## 2015-04-12 MED ORDER — IPRATROPIUM-ALBUTEROL 0.5-2.5 (3) MG/3ML IN SOLN
3.0000 mL | RESPIRATORY_TRACT | Status: DC | PRN
  Administered 2015-04-14 – 2015-04-15 (×3): 3 mL via RESPIRATORY_TRACT
  Filled 2015-04-12 (×3): qty 3

## 2015-04-12 MED ORDER — ENOXAPARIN SODIUM 40 MG/0.4ML ~~LOC~~ SOLN
40.0000 mg | Freq: Two times a day (BID) | SUBCUTANEOUS | Status: DC
  Administered 2015-04-12 – 2015-04-15 (×6): 40 mg via SUBCUTANEOUS
  Filled 2015-04-12 (×8): qty 0.4

## 2015-04-12 MED ORDER — MORPHINE SULFATE ER 30 MG PO TBCR
30.0000 mg | EXTENDED_RELEASE_TABLET | Freq: Two times a day (BID) | ORAL | Status: DC
  Administered 2015-04-12 – 2015-04-15 (×6): 30 mg via ORAL
  Filled 2015-04-12 (×6): qty 1

## 2015-04-12 MED ORDER — PREGABALIN 75 MG PO CAPS
150.0000 mg | ORAL_CAPSULE | Freq: Two times a day (BID) | ORAL | Status: DC
  Administered 2015-04-12 – 2015-04-15 (×6): 150 mg via ORAL
  Filled 2015-04-12 (×6): qty 2

## 2015-04-12 MED ORDER — LEVOFLOXACIN IN D5W 750 MG/150ML IV SOLN
750.0000 mg | Freq: Once | INTRAVENOUS | Status: DC
  Administered 2015-04-12: 750 mg via INTRAVENOUS
  Filled 2015-04-12: qty 150

## 2015-04-12 MED ORDER — METHYLPREDNISOLONE SODIUM SUCC 125 MG IJ SOLR
125.0000 mg | Freq: Once | INTRAMUSCULAR | Status: AC
  Administered 2015-04-12: 125 mg via INTRAVENOUS
  Filled 2015-04-12: qty 2

## 2015-04-12 MED ORDER — ACETAMINOPHEN 325 MG PO TABS: 650.0000 mg | ORAL_TABLET | Freq: Four times a day (QID) | ORAL | Status: DC | PRN

## 2015-04-12 MED ORDER — BUDESONIDE 0.5 MG/2ML IN SUSP
0.5000 mg | Freq: Two times a day (BID) | RESPIRATORY_TRACT | Status: DC
  Administered 2015-04-12 – 2015-04-13 (×2): 0.5 mg via RESPIRATORY_TRACT
  Filled 2015-04-12 (×3): qty 2

## 2015-04-12 MED ORDER — MORPHINE SULFATE (PF) 4 MG/ML IV SOLN
4.0000 mg | Freq: Once | INTRAVENOUS | Status: AC
  Administered 2015-04-12: 4 mg via INTRAVENOUS
  Filled 2015-04-12: qty 1

## 2015-04-12 MED ORDER — CITALOPRAM HYDROBROMIDE 20 MG PO TABS
20.0000 mg | ORAL_TABLET | Freq: Every day | ORAL | Status: DC
  Administered 2015-04-12 – 2015-04-15 (×4): 20 mg via ORAL
  Filled 2015-04-12 (×4): qty 1

## 2015-04-12 MED ORDER — FLUTICASONE PROPIONATE 50 MCG/ACT NA SUSP
2.0000 | Freq: Every day | NASAL | Status: DC | PRN
  Administered 2015-04-13: 09:00:00 2 via NASAL
  Filled 2015-04-12: qty 16

## 2015-04-12 MED ORDER — ONDANSETRON HCL 4 MG PO TABS: 4.0000 mg | ORAL_TABLET | Freq: Four times a day (QID) | ORAL | Status: DC | PRN

## 2015-04-12 MED ORDER — DEXTROSE 5 % IV SOLN: 500.0000 mg | INTRAVENOUS | Status: DC

## 2015-04-12 MED ORDER — IPRATROPIUM-ALBUTEROL 0.5-2.5 (3) MG/3ML IN SOLN
3.0000 mL | Freq: Once | RESPIRATORY_TRACT | Status: AC
  Administered 2015-04-12: 3 mL via RESPIRATORY_TRACT
  Filled 2015-04-12: qty 3

## 2015-04-12 MED ORDER — OXYBUTYNIN CHLORIDE 5 MG PO TABS
5.0000 mg | ORAL_TABLET | Freq: Every day | ORAL | Status: DC
  Administered 2015-04-12 – 2015-04-15 (×4): 5 mg via ORAL
  Filled 2015-04-12 (×4): qty 1

## 2015-04-12 MED ORDER — MORPHINE SULFATE ER 15 MG PO TBCR
15.0000 mg | EXTENDED_RELEASE_TABLET | Freq: Two times a day (BID) | ORAL | Status: DC
  Administered 2015-04-12 – 2015-04-15 (×6): 15 mg via ORAL
  Filled 2015-04-12 (×6): qty 1

## 2015-04-12 MED ORDER — ONDANSETRON HCL 4 MG/2ML IJ SOLN: 4.0000 mg | Freq: Four times a day (QID) | INTRAMUSCULAR | Status: DC | PRN

## 2015-04-12 MED ORDER — GUAIFENESIN-DM 100-10 MG/5ML PO SYRP: 5.0000 mL | ORAL_SOLUTION | ORAL | Status: DC | PRN

## 2015-04-12 MED ORDER — ACETAMINOPHEN 650 MG RE SUPP: 650.0000 mg | Freq: Four times a day (QID) | RECTAL | Status: DC | PRN

## 2015-04-12 MED ORDER — TIOTROPIUM BROMIDE MONOHYDRATE 18 MCG IN CAPS
18.0000 ug | ORAL_CAPSULE | Freq: Every day | RESPIRATORY_TRACT | Status: DC
  Administered 2015-04-12 – 2015-04-13 (×2): 18 ug via RESPIRATORY_TRACT
  Filled 2015-04-12: qty 5

## 2015-04-12 MED ORDER — PREDNISONE 20 MG PO TABS: 40.0000 mg | ORAL_TABLET | Freq: Every day | ORAL | Status: DC

## 2015-04-12 MED ORDER — PROPRANOLOL HCL ER 80 MG PO CP24
80.0000 mg | ORAL_CAPSULE | Freq: Every day | ORAL | Status: DC
  Administered 2015-04-12 – 2015-04-15 (×4): 80 mg via ORAL
  Filled 2015-04-12 (×5): qty 1

## 2015-04-12 MED ORDER — MORPHINE SULFATE (PF) 2 MG/ML IV SOLN
2.0000 mg | INTRAVENOUS | Status: DC | PRN
  Administered 2015-04-13: 2 mg via INTRAVENOUS
  Filled 2015-04-12: qty 1

## 2015-04-12 MED ORDER — FLUTICASONE FUROATE-VILANTEROL 100-25 MCG/INH IN AEPB: 1.0000 | INHALATION_SPRAY | Freq: Two times a day (BID) | RESPIRATORY_TRACT | Status: DC

## 2015-04-12 MED ORDER — ROPINIROLE HCL 0.25 MG PO TABS
0.7500 mg | ORAL_TABLET | Freq: Every day | ORAL | Status: DC
  Administered 2015-04-12 – 2015-04-14 (×3): 0.75 mg via ORAL
  Filled 2015-04-12 (×4): qty 3

## 2015-04-12 MED ORDER — COLESEVELAM HCL 625 MG PO TABS
625.0000 mg | ORAL_TABLET | Freq: Two times a day (BID) | ORAL | Status: DC
  Administered 2015-04-13 – 2015-04-15 (×5): 625 mg via ORAL
  Filled 2015-04-12 (×7): qty 1

## 2015-04-12 MED ORDER — MOMETASONE FURO-FORMOTEROL FUM 100-5 MCG/ACT IN AERO
2.0000 | INHALATION_SPRAY | Freq: Two times a day (BID) | RESPIRATORY_TRACT | Status: DC
  Administered 2015-04-12 – 2015-04-15 (×6): 2 via RESPIRATORY_TRACT
  Filled 2015-04-12: qty 8.8

## 2015-04-12 MED ORDER — OXYCODONE HCL 5 MG PO TABS
5.0000 mg | ORAL_TABLET | ORAL | Status: DC | PRN
  Administered 2015-04-12 – 2015-04-13 (×3): 5 mg via ORAL
  Filled 2015-04-12 (×3): qty 1

## 2015-04-12 MED ORDER — MAGNESIUM OXIDE 400 (241.3 MG) MG PO TABS
400.0000 mg | ORAL_TABLET | Freq: Every day | ORAL | Status: DC
  Administered 2015-04-12 – 2015-04-15 (×4): 400 mg via ORAL
  Filled 2015-04-12 (×4): qty 1

## 2015-04-12 MED ORDER — PANTOPRAZOLE SODIUM 40 MG PO TBEC
40.0000 mg | DELAYED_RELEASE_TABLET | Freq: Every day | ORAL | Status: DC
  Administered 2015-04-12 – 2015-04-15 (×4): 40 mg via ORAL
  Filled 2015-04-12 (×4): qty 1

## 2015-04-12 MED ORDER — AZITHROMYCIN 500 MG IV SOLR
500.0000 mg | INTRAVENOUS | Status: DC
  Filled 2015-04-12: qty 500

## 2015-04-12 NOTE — ED Provider Notes (Signed)
-----------------------------------------   5:04 PM on 04/12/2015 -----------------------------------------  Care was assumed by Dr. Kerman Passey at 3:30 PM pending labs which are notable for anemia with hemoglobin 10.7. Negative troponin. The patient remains tachypneic with increased work of breathing despite DuoNeb and steroids. I discussed the case with the hospitalist for admission.  Joanne Gavel, MD 04/12/15 [phone]

## 2015-04-12 NOTE — Progress Notes (Signed)
°   04/12/15 1900  Clinical Encounter Type  Visited With Patient and family together  Visit Type Initial  Referral From Nurse  Consult/Referral To Chaplain  Spiritual Encounters  Spiritual Needs Prayer  Stress Factors  Patient Stress Factors Health changes  Family Stress Factors Not reviewed  Called for consult.  Offered prayer and spiritual care. [place] Ext [phone]

## 2015-04-12 NOTE — Progress Notes (Signed)
Name: Loretta Wade   MRN: [id number]    DOB: 1950-11-19   Date:04/12/2015       Progress Note  Subjective  Chief Complaint  Chief Complaint  Patient presents with  . Pain  . Referral    Home health forms, to be filled out    Chest Pain  This is a new problem. Episode onset: 2 days. The onset quality is sudden. The pain is present in the substernal region. The pain is at a severity of 7/10. The pain is moderate. The quality of the pain is described as heavy. The pain radiates to the left arm, left shoulder and left jaw (radiates down to her left breast). Associated symptoms include abdominal pain, a cough, shortness of breath (' i have been having extra breathing problems') and sputum production (thick mucus). Pertinent negatives include no fever, palpitations or vomiting. Nausea: 2 episodes of nausea. The pain is aggravated by breathing, exertion and movement. She has tried nothing for the symptoms.   Pt. Is here for authorization of medical supplies to continue after her discharge from Hospice. She has [employer] on both legs, started during hospice for bilateral lower extremity swelling. She notices that the [employer] help 'a little bit'. Her HH Nurse changes the boots every 7 days and puts a new one on. She is also in need of renewal of her Bariatric Bed, Oxygen with Portable tansk, Nebulizer, and wheelchair.  For Bariatric Bed, she needs it to elevated the [facility] to help her breathe better, improves her symptoms of COPD and esophageal reflux.  Regarding Nebulizer, she uses it for COPD, does Nebulizer 4x/ day, which helps improve her breathing to some extent. As far as Oxygen with portable tank is concerned, she will be tested today without Oxygen on to see if she qualifes.  Finally, her wheelchair is helpful for her to ambulate. SHe has limited mobility 2/2 morbid obesity, COPD, and chronic pain.   Past Medical History  Diagnosis Date  . OA (osteoarthritis)   . Fibromyalgia   . Anemia    . Sleep apnea   . Fatigue   . Edema   . Esophageal reflux   . Nocturia   . Hypertension   . COPD (chronic obstructive pulmonary disease) [facility])     Past Surgical History  Procedure Laterality Date  . Cholecystectomy    . Total knee arthroplasty      right  . Vesicovaginal fistula closure w/ tah    . Abdominal hysterectomy      Family History  Problem Relation Age of Onset  . Hypertension Mother   . Diabetes    . Breast cancer Daughter     Social History   Social History  . Marital Status: Single    Spouse Name: N/A  . Number of Children: N/A  . Years of Education: N/A   Occupational History  . Not on file.   Social History Main Topics  . Smoking status: Former Smoker    Quit date: 11/04/1983  . Smokeless tobacco: Not on file  . Alcohol Use: No  . Drug Use: No  . Sexual Activity: Not on file   Other Topics Concern  . Not on file   Social History Narrative     Current outpatient prescriptions:  .  acetaminophen (TYLENOL) 325 MG tablet, Take 2 tablets (650 mg total) by mouth every 6 (six) hours as needed for mild pain (or Fever >/= 101)., Disp: 30 tablet, Rfl: 0 .  albuterol (  PROVENTIL) (2.5 MG/3ML) 0.083% nebulizer solution, Take 3 mLs (2.5 mg total) by nebulization every 6 (six) hours as needed for wheezing or shortness of breath., Disp: 75 mL, Rfl: 12 .  budesonide (PULMICORT) 0.5 MG/2ML nebulizer solution, , Disp: , Rfl:  .  Chlorpheniramine-Acetaminophen (CORICIDIN HBP COLD/FLU PO), Take 2 tablets by mouth daily as needed (for nasal congestion/cough). , Disp: , Rfl:  .  cholecalciferol (VITAMIN D) 1000 UNITS tablet, Take 1,000 Units by mouth daily. , Disp: , Rfl:  .  citalopram (CELEXA) 20 MG tablet, Take 20 mg by mouth daily., Disp: , Rfl:  .  dexlansoprazole (DEXILANT) 60 MG capsule, Take 1 capsule (60 mg total) by mouth daily., Disp: 90 capsule, Rfl: 0 .  Diphenhyd-Hydrocort-Nystatin (FIRST-DUKES MOUTHWASH) SUSP, Use as directed 1 Squirt in the mouth  or throat 2 (two) times daily. Swish and spit, Disp: 1 Bottle, Rfl: 0 .  docusate sodium (COLACE) 50 MG capsule, Take 100 mg by mouth daily., Disp: , Rfl:  .  Fluticasone Furoate-Vilanterol 100-25 MCG/INH AEPB, Inhale 1 puff into the lungs 2 (two) times daily. , Disp: , Rfl:  .  furosemide (LASIX) 20 MG tablet, Take 1 tablet (20 mg total) by mouth every other day., Disp: 30 tablet, Rfl: 0 .  furosemide (LASIX) 40 MG tablet, Take 1 tablet (40 mg total) by mouth 2 (two) times daily., Disp: 30 tablet, Rfl: 60 .  guaiFENesin-dextromethorphan (ROBITUSSIN DM) 100-10 MG/5ML syrup, Take 10 mLs by mouth every 4 (four) hours as needed for cough., Disp: , Rfl:  .  HYDROcodone-acetaminophen (NORCO/VICODIN) 5-325 MG tablet, Take 1 tablet by mouth every 8 (eight) hours as needed for moderate pain., Disp: 90 tablet, Rfl: 0 .  ipratropium-albuterol (DUONEB) 0.5-2.5 (3) MG/3ML SOLN, Take 3 mLs by nebulization every 6 (six) hours., Disp: 360 mL, Rfl: 0 .  linezolid (ZYVOX) 600 MG tablet, Take 1 tablet (600 mg total) by mouth every 12 (twelve) hours., Disp: 8 tablet, Rfl: 0 .  Magnesium Oxide 250 MG TABS, Take 250 mg by mouth daily., Disp: , Rfl:  .  methocarbamol (ROBAXIN) 500 MG tablet, Take 500 mg by mouth every 6 (six) hours as needed for muscle spasms., Disp: , Rfl:  .  morphine (MS CONTIN) 15 MG 12 hr tablet, Take 15 mg by mouth every 12 (twelve) hours., Disp: , Rfl:  .  morphine (MS CONTIN) 30 MG 12 hr tablet, Take 30 mg by mouth every 12 (twelve) hours., Disp: , Rfl:  .  morphine 10 MG/5ML solution, Take 5 mg by mouth every 2 (two) hours as needed for severe pain (For Dyspnea)., Disp: , Rfl:  .  oxybutynin (DITROPAN) 5 MG tablet, Take 1 tablet (5 mg total) by mouth daily., Disp: 30 tablet, Rfl: 0 .  pantoprazole (PROTONIX) 40 MG tablet, Take 1 tablet (40 mg total) by mouth 2 (two) times daily., Disp: 60 tablet, Rfl: 0 .  predniSONE (STERAPRED UNI-PAK 21 TAB) 10 MG (21) TBPK tablet, Take 1 tablet (10 mg total)  by mouth daily. Start with 50mg  daily, taper by 10mg  each day then stop, Disp: 61 tablet, Rfl: 0 .  pregabalin (LYRICA) 150 MG capsule, Take 1 capsule (150 mg total) by mouth 2 (two) times daily., Disp: 60 capsule, Rfl: 0 .  propranolol (INDERAL) 80 MG tablet, Take 80 mg by mouth daily., Disp: , Rfl:  .  RA TUSSIN CGH/CHEST CONGEST DM 100-10 MG/5ML liquid, take 5 milliliters by mouth every 4 hours if needed, Disp: , Rfl: 0 .  ranitidine (ZANTAC) 150 MG tablet, Take 150 mg by mouth 3 (three) times daily as needed for heartburn., Disp: , Rfl:  .  rOPINIRole (REQUIP) 0.25 MG tablet, Take 3 tablets (0.75 mg total) by mouth at bedtime., Disp: 90 tablet, Rfl: 2 .  senna-docusate (SENOKOT-S) 8.6-50 MG per tablet, Take 2 tablets by mouth daily., Disp: 60 tablet, Rfl: 0  No Known Allergies   Review of Systems  Constitutional: Negative for fever.  Respiratory: Positive for cough, sputum production (thick mucus), shortness of breath (' i have been having extra breathing problems') and wheezing.   Cardiovascular: Positive for chest pain. Negative for palpitations.  Gastrointestinal: Positive for abdominal pain. Negative for vomiting. Nausea: 2 episodes of nausea.    Objective  Filed Vitals:   04/12/15 1129  BP: 138/80  Pulse: 88  Temp: 98.5 F (36.9 C)  TempSrc: Oral  Resp: 16  Height: 5\' 5"  (1.651 m)  Weight: 344 lb (156.037 kg)  SpO2: 90%    Physical Exam  Constitutional: She is oriented to person, place, and time and well-developed, well-nourished, and in no distress.  HENT:  Head: Normocephalic and atraumatic.  Cardiovascular: Normal rate and regular rhythm.   Pulmonary/Chest: She has decreased breath sounds. She has wheezes.  Decreased breath spunds throughout, soft faint expiratory wheezes throughout both lung fields.  Musculoskeletal:       Right ankle: She exhibits swelling.       Left ankle: She exhibits swelling.  Una Boots on but has palpable swelling and tenderness on  bilateral ankles.  Neurological: She is alert and oriented to person, place, and time.  Nursing note and vitals reviewed.    Assessment & Plan  1. Chest pain with high risk for cardiac etiology We will send patient to the ER via EMS. Plan discussed with charge nurse Tammy Sours at [facility] ED.  2. COPD, frequent exacerbations (HCC)   3. Poor mobility Will authorize bariatric bed, portable oxygen, nebulizer, and wheelchair to help with ADL.  Princesa Willig Asad [facility] 04/12/2015 11:36 AM

## 2015-04-12 NOTE — H&P (Signed)
[facility] [place] [facility]   PATIENT NAME: Loretta Wade    MR#:  [id number]  DATE OF BIRTH:  Sep 30, 1950   DATE OF ADMISSION:  04/12/2015  PRIMARY CARE PHYSICIAN: Brayton El, MD   REQUESTING/REFERRING PHYSICIAN: Inocencio Homes  CHIEF COMPLAINT:   Chief Complaint  Patient presents with  . Cough   shortness of breath  HISTORY OF PRESENT ILLNESS:  Loretta Wade  is a 65 y.o. female with a known history of COPD, chronic respiratory failure 2 L nasal cannula Baseline presenting with shortness of breath. She describes URI like symptoms presents with 3 days ago with progressively worsening shortness of breath. Shortness breath is both at rest and with exertion, she describes wheeze as well as productive cough thick greenish sputum denies fevers positive subjective chills  PAST MEDICAL HISTORY:   Past Medical History  Diagnosis Date  . OA (osteoarthritis)   . Fibromyalgia   . Anemia   . Sleep apnea   . Fatigue   . Edema   . Esophageal reflux   . Nocturia   . Hypertension   . COPD (chronic obstructive pulmonary disease) (HCC)     PAST SURGICAL HISTORY:   Past Surgical History  Procedure Laterality Date  . Cholecystectomy    . Total knee arthroplasty      right  . Vesicovaginal fistula closure w/ tah    . Abdominal hysterectomy      SOCIAL HISTORY:   Social History  Substance Use Topics  . Smoking status: Former Smoker    Quit date: 11/04/1983  . Smokeless tobacco: Not on file  . Alcohol Use: No    FAMILY HISTORY:   Family History  Problem Relation Age of Onset  . Hypertension Mother   . Diabetes    . Breast cancer Daughter     DRUG ALLERGIES:  No Known Allergies  REVIEW OF SYSTEMS:  REVIEW OF SYSTEMS:  CONSTITUTIONAL: Denies fevers, positive chills, fatigue, weakness.  EYES: Denies blurred vision, double vision, or eye pain.  EARS, NOSE, THROAT: Denies tinnitus, ear pain, hearing loss.  RESPIRATORY: Positive cough,  shortness of breath, wheezing  CARDIOVASCULAR: Denies chest pain, palpitations, edema.  GASTROINTESTINAL: Denies nausea, vomiting, diarrhea, abdominal pain.  GENITOURINARY: Denies dysuria, hematuria.  ENDOCRINE: Denies nocturia or thyroid problems. HEMATOLOGIC AND LYMPHATIC: Denies easy bruising or bleeding.  SKIN: Denies rash or lesions.  MUSCULOSKELETAL: Denies pain in neck, back, shoulder, knees, hips, or further arthritic symptoms.  NEUROLOGIC: Denies paralysis, paresthesias.  PSYCHIATRIC: Denies anxiety or depressive symptoms. Otherwise full review of systems performed by me is negative.   MEDICATIONS AT HOME:   Prior to Admission medications   Medication Sig Start Date End Date Taking? Authorizing Provider  acetaminophen (TYLENOL) 325 MG tablet Take 2 tablets (650 mg total) by mouth every 6 (six) hours as needed for mild pain (or Fever >/= 101). 11/17/14  Yes Enid Baas, MD  albuterol (PROVENTIL HFA;VENTOLIN HFA) 108 (90 Base) MCG/ACT inhaler Inhale 2 puffs into the lungs every 6 (six) hours as needed for wheezing or shortness of breath.   Yes Historical Provider, MD  budesonide (PULMICORT) 0.5 MG/2ML nebulizer solution Take 0.5 mg by nebulization 2 (two) times daily.    Yes Historical Provider, MD  Chlorphen-Phenyleph-APAP (CORICIDIN D COLD/FLU/SINUS) 2-5-325 MG TABS Take 2 tablets by mouth daily as needed (for nasal congestion/cough).   Yes Historical Provider, MD  cholecalciferol (VITAMIN D) 1000 UNITS tablet Take 1,000 Units by mouth daily.    Yes Historical  Provider, MD  citalopram (CELEXA) 20 MG tablet Take 20 mg by mouth daily.   Yes Historical Provider, MD  colesevelam (WELCHOL) 625 MG tablet Take 625 mg by mouth 2 (two) times daily with a meal.   Yes Historical Provider, MD  dexlansoprazole (DEXILANT) 60 MG capsule Take 1 capsule (60 mg total) by mouth daily. 03/23/15  Yes Ellyn Hack, MD  fluticasone (FLONASE) 50 MCG/ACT nasal spray Place 2 sprays into both nostrils  daily as needed for rhinitis.   Yes Historical Provider, MD  Fluticasone Furoate-Vilanterol 100-25 MCG/INH AEPB Inhale 1 puff into the lungs 2 (two) times daily.    Yes Historical Provider, MD  furosemide (LASIX) 40 MG tablet Take 1 tablet (40 mg total) by mouth 2 (two) times daily. 02/08/15  Yes Auburn Bilberry, MD  guaiFENesin-dextromethorphan (ROBITUSSIN DM) 100-10 MG/5ML syrup Take 5-10 mLs by mouth every 4 (four) hours as needed for cough.    Yes Historical Provider, MD  HYDROcodone-acetaminophen (NORCO/VICODIN) 5-325 MG tablet Take 1-2 tablets by mouth every 8 (eight) hours as needed for moderate pain.   Yes Historical Provider, MD  ipratropium-albuterol (DUONEB) 0.5-2.5 (3) MG/3ML SOLN Take 3 mLs by nebulization every 6 (six) hours. Patient taking differently: Take 3 mLs by nebulization every 6 (six) hours as needed (for wheezing/shortness of breath).  04/02/15  Yes Ellyn Hack, MD  loperamide (IMODIUM) 2 MG capsule Take 2-4 mg by mouth as needed for diarrhea or loose stools.   Yes Historical Provider, MD  Magnesium 250 MG TABS Take 250 mg by mouth daily.   Yes Historical Provider, MD  morphine (MS CONTIN) 15 MG 12 hr tablet Take 15 mg by mouth 2 (two) times daily. Pt takes with a 30mg  tablet.   Yes Historical Provider, MD  morphine (MS CONTIN) 30 MG 12 hr tablet Take 30 mg by mouth 2 (two) times daily. Pt takes with a 15mg  tablet.   Yes Historical Provider, MD  oxybutynin (DITROPAN) 5 MG tablet Take 1 tablet (5 mg total) by mouth daily. 11/17/14  Yes Enid Baas, MD  pregabalin (LYRICA) 150 MG capsule Take 1 capsule (150 mg total) by mouth 2 (two) times daily. 03/23/15  Yes Ellyn Hack, MD  propranolol ER (INDERAL LA) 80 MG 24 hr capsule Take 80 mg by mouth daily.   Yes Historical Provider, MD  ranitidine (ZANTAC) 150 MG tablet Take 150 mg by mouth 3 (three) times daily as needed for heartburn.   Yes Historical Provider, MD  rOPINIRole (REQUIP) 0.25 MG tablet Take 3 tablets (0.75 mg  total) by mouth at bedtime. 01/26/15  Yes Ellyn Hack, MD  tiotropium (SPIRIVA) 18 MCG inhalation capsule Place 18 mcg into inhaler and inhale daily.   Yes Historical Provider, MD  albuterol (PROVENTIL) (2.5 MG/3ML) 0.083% nebulizer solution Take 3 mLs (2.5 mg total) by nebulization every 6 (six) hours as needed for wheezing or shortness of breath. Patient not taking: Reported on 04/12/2015 11/19/14   Enid Baas, MD  azithromycin (ZITHROMAX Z-PAK) 250 MG tablet Take 2 tablets (500 mg) on  Day 1,  followed by 1 tablet (250 mg) once daily on Days 2 through 5. 04/12/15 04/17/15  Minna Antis, MD  Diphenhyd-Hydrocort-Nystatin (FIRST-DUKES MOUTHWASH) SUSP Use as directed 1 Squirt in the mouth or throat 2 (two) times daily. Swish and spit Patient not taking: Reported on 04/12/2015 03/29/15   Ellyn Hack, MD  furosemide (LASIX) 20 MG tablet Take 1 tablet (20 mg total)  by mouth every other day. Patient not taking: Reported on 04/12/2015 11/17/14   Enid Baas, MD  linezolid (ZYVOX) 600 MG tablet Take 1 tablet (600 mg total) by mouth every 12 (twelve) hours. Patient not taking: Reported on 04/12/2015 02/08/15   Auburn Bilberry, MD  pantoprazole (PROTONIX) 40 MG tablet Take 1 tablet (40 mg total) by mouth 2 (two) times daily. Patient not taking: Reported on 04/12/2015 11/08/14   Altamese Dilling, MD  predniSONE (DELTASONE) 20 MG tablet Take 2 tablets (40 mg total) by mouth daily. 04/12/15   Minna Antis, MD  senna-docusate (SENOKOT-S) 8.6-50 MG per tablet Take 2 tablets by mouth daily. Patient not taking: Reported on 04/12/2015 11/19/14   Enid Baas, MD      VITAL SIGNS:  Blood pressure 141/70, pulse 77, temperature 98.1 F (36.7 C), temperature source Oral, resp. rate 28, height 5\' 4"  (1.626 m), weight 345 lb (156.491 kg), SpO2 96 %.  PHYSICAL EXAMINATION:  VITAL SIGNS: Filed Vitals:   04/12/15 1715 04/12/15 1730  BP:  141/70  Pulse: 74 77  Temp:    Resp: 23 28   GENERAL:64  y.o.female currently in minimal acute distress risk for status.  HEAD: Normocephalic, atraumatic.  EYES: Pupils equal, round, reactive to light. Extraocular muscles intact. No scleral icterus.  MOUTH: Moist mucosal membrane. Dentition intact. No abscess noted.  EAR, NOSE, THROAT: Clear without exudates. No external lesions.  NECK: Supple. No thyromegaly. No nodules. No JVD.  PULMONARY: Diminished breath sounds secondary to body habitus however diffuse coarse rhonchi with scattered expiratory wheeze. No use of accessory muscles, Good respiratory effort. good air entry bilaterally CHEST: Nontender to palpation.  CARDIOVASCULAR: S1 and S2. Regular rate and rhythm. No murmurs, rubs, or gallops. 1+ good good edema. Pedal pulses 2+ bilaterally.  GASTROINTESTINAL: Soft, nontender, nondistended. No masses. Positive bowel sounds. No hepatosplenomegaly.  MUSCULOSKELETAL: No swelling, clubbing, or edema. Range of motion full in all extremities.  NEUROLOGIC: Cranial nerves II through XII are intact. No gross focal neurological deficits. Sensation intact. Reflexes intact.  SKIN: No ulceration, lesions, rashes, or cyanosis. Skin warm and dry. Turgor intact.  PSYCHIATRIC: Mood, affect within normal limits. The patient is awake, alert and oriented x 3. Insight, judgment intact.    LABORATORY PANEL:   CBC  Recent Labs Lab 04/12/15 1502  WBC 5.3  HGB 10.7*  HCT 33.4*  PLT 239   ------------------------------------------------------------------------------------------------------------------  Chemistries   Recent Labs Lab 04/12/15 1502  NA 142  K 3.3*  CL 105  CO2 30  GLUCOSE 103*  BUN 6  CREATININE 0.58  CALCIUM 9.2   ------------------------------------------------------------------------------------------------------------------  Cardiac Enzymes  Recent Labs Lab 04/12/15 1502  TROPONINI 0.03    ------------------------------------------------------------------------------------------------------------------  RADIOLOGY:  Dg Chest 2 View  04/12/2015  CLINICAL DATA:  Substernal chest pain.  Shortness of breath. EXAM: CHEST  2 VIEW COMPARISON:  January 30, 2015 FINDINGS: Stable cardiomediastinal silhouette with persistent cardiomegaly. Atelectasis in the left lung base. No other acute abnormalities are identified. IMPRESSION: No active cardiopulmonary disease. Electronically Signed   By: Gerome Sam III M.D   On: 04/12/2015 13:28    EKG:   Orders placed or performed during the hospital encounter of 04/12/15  . ED EKG within 10 minutes  . ED EKG within 10 minutes  . EKG 12-Lead  . EKG 12-Lead  . EKG 12-Lead  . EKG 12-Lead    IMPRESSION AND PLAN:   65 year old Caucasian female history of COPD chronic respiratory failure 2 L [place] Baseline presenting  with shortness breath  1.Chronic obstructive pulmonary disease exacerbation: Provide DuoNeb treatments q. 4 hours, Solu-Medrol 60 mg IV q. daily, azithromycin. Continue with home medications.  2. Hypokalemia: Check magnesium level replace potassium goal 4-5 3. GERD without esophagitis PPI therapy 4. Morbid obesity: Nutrition counseling 5. Disposition: Patient. Previously followed by hospice however full code she followed for symptom management will consult of care for the same 6. Venous thrombus embolism prophylactic: Lovenox        All the records are reviewed and case discussed with ED provider. Management plans discussed with the patient, family and they are in agreement.  CODE STATUS: Full  TOTAL TIME TAKING CARE OF THIS PATIENT: 40 minutes.    Cordaro Mukai,  Mardi Mainland.D on 04/12/2015 at 6:01 PM  Between 7am to 6pm - Pager - [phone]  After 6pm: House Pager: - [phone]  [facility]  Office  [phone]  CC: Primary care physician; Brayton El, MD

## 2015-04-12 NOTE — ED Provider Notes (Signed)
[facility] Emergency Department Provider Note  Time seen: 1:55 PM  I have reviewed the triage vital signs and the nursing notes.   HISTORY  Chief Complaint Cough    HPI Loretta Wade is a 65 y.o. female with a past medical history fibromyalgia, hypertension, COPD on 2 L oxygen 24/7, presents the emergency department with cough and congestion. According to the patient the past 2-3 days she has been having cough with sputum production, feeling short of breath and having occasional left-sided chest discomfort. She also states for the past several days she has been having mild dysuria. Denies fever. Does state a mild wheeze, has been using her nebulizer treatment 4 times daily as prescribed. States her shortness of breath is very mild at this time, denies any chest pain currently.     Past Medical History  Diagnosis Date  . OA (osteoarthritis)   . Fibromyalgia   . Anemia   . Sleep apnea   . Fatigue   . Edema   . Esophageal reflux   . Nocturia   . Hypertension   . COPD (chronic obstructive pulmonary disease) [facility])     Patient Active Problem List   Diagnosis Date Noted  . Chest pain with high risk for cardiac etiology 04/12/2015  . Fibromyalgia 03/23/2015  . Esophageal reflux 03/23/2015  . MRSA infection 11/17/2014  . Acute cystitis 11/17/2014  . ARF (acute renal failure) ([place]) 11/13/2014  . Diastolic CHF, acute on chronic (HCC) 11/12/2014  . Morbid obesity with BMI of 50.0-59.9, adult ([place]) 11/12/2014  . Neck pain, chronic 11/12/2014  . COPD, frequent exacerbations ([place]) 11/04/2014  . Urinary tract infection 10/02/2014    Past Surgical History  Procedure Laterality Date  . Cholecystectomy    . Total knee arthroplasty      right  . Vesicovaginal fistula closure w/ tah    . Abdominal hysterectomy      Current Outpatient Rx  Name  Route  Sig  Dispense  Refill  . acetaminophen (TYLENOL) 325 MG tablet   Oral   Take 2 tablets (650 mg  total) by mouth every 6 (six) hours as needed for mild pain (or Fever >/= 101).   30 tablet   0   . albuterol (PROVENTIL) (2.5 MG/3ML) 0.083% nebulizer solution   Nebulization   Take 3 mLs (2.5 mg total) by nebulization every 6 (six) hours as needed for wheezing or shortness of breath.   75 mL   12   . budesonide (PULMICORT) 0.5 MG/2ML nebulizer solution               . Chlorpheniramine-Acetaminophen (CORICIDIN HBP COLD/FLU PO)   Oral   Take 2 tablets by mouth daily as needed (for nasal congestion/cough).          . cholecalciferol (VITAMIN D) 1000 UNITS tablet   Oral   Take 1,000 Units by mouth daily.          . citalopram (CELEXA) 20 MG tablet   Oral   Take 20 mg by mouth daily.         Marland Kitchen dexlansoprazole (DEXILANT) 60 MG capsule   Oral   Take 1 capsule (60 mg total) by mouth daily.   90 capsule   0   . Diphenhyd-Hydrocort-Nystatin (FIRST-DUKES MOUTHWASH) SUSP   Mouth/Throat   Use as directed 1 Squirt in the mouth or throat 2 (two) times daily. Swish and spit   1 Bottle   0   . docusate sodium (  COLACE) 50 MG capsule   Oral   Take 100 mg by mouth daily.         . Fluticasone Furoate-Vilanterol 100-25 MCG/INH AEPB   Inhalation   Inhale 1 puff into the lungs 2 (two) times daily.          . furosemide (LASIX) 20 MG tablet   Oral   Take 1 tablet (20 mg total) by mouth every other day.   30 tablet   0   . furosemide (LASIX) 40 MG tablet   Oral   Take 1 tablet (40 mg total) by mouth 2 (two) times daily.   30 tablet   60   . guaiFENesin-dextromethorphan (ROBITUSSIN DM) 100-10 MG/5ML syrup   Oral   Take 10 mLs by mouth every 4 (four) hours as needed for cough.         Marland Kitchen HYDROcodone-acetaminophen (NORCO/VICODIN) 5-325 MG tablet   Oral   Take 1 tablet by mouth every 8 (eight) hours as needed for moderate pain.   90 tablet   0   . ipratropium-albuterol (DUONEB) 0.5-2.5 (3) MG/3ML SOLN   Nebulization   Take 3 mLs by nebulization every 6 (six)  hours.   360 mL   0   . linezolid (ZYVOX) 600 MG tablet   Oral   Take 1 tablet (600 mg total) by mouth every 12 (twelve) hours.   8 tablet   0   . Magnesium Oxide 250 MG TABS   Oral   Take 250 mg by mouth daily.         . methocarbamol (ROBAXIN) 500 MG tablet   Oral   Take 500 mg by mouth every 6 (six) hours as needed for muscle spasms.         Marland Kitchen morphine (MS CONTIN) 15 MG 12 hr tablet   Oral   Take 15 mg by mouth every 12 (twelve) hours.         Marland Kitchen morphine (MS CONTIN) 30 MG 12 hr tablet   Oral   Take 30 mg by mouth every 12 (twelve) hours.         Marland Kitchen morphine 10 MG/5ML solution   Oral   Take 5 mg by mouth every 2 (two) hours as needed for severe pain (For Dyspnea).         Marland Kitchen oxybutynin (DITROPAN) 5 MG tablet   Oral   Take 1 tablet (5 mg total) by mouth daily.   30 tablet   0   . pantoprazole (PROTONIX) 40 MG tablet   Oral   Take 1 tablet (40 mg total) by mouth 2 (two) times daily.   60 tablet   0   . predniSONE (STERAPRED UNI-PAK 21 TAB) 10 MG (21) TBPK tablet   Oral   Take 1 tablet (10 mg total) by mouth daily. Start with 50mg  daily, taper by 10mg  each day then stop   61 tablet   0   . pregabalin (LYRICA) 150 MG capsule   Oral   Take 1 capsule (150 mg total) by mouth 2 (two) times daily.   60 capsule   0   . propranolol (INDERAL) 80 MG tablet   Oral   Take 80 mg by mouth daily.         Marland Kitchen RA TUSSIN CGH/CHEST CONGEST DM 100-10 MG/5ML liquid      take 5 milliliters by mouth every 4 hours if needed      0     Dispense as written.   Marland Kitchen  ranitidine (ZANTAC) 150 MG tablet   Oral   Take 150 mg by mouth 3 (three) times daily as needed for heartburn.         Marland Kitchen rOPINIRole (REQUIP) 0.25 MG tablet   Oral   Take 3 tablets (0.75 mg total) by mouth at bedtime.   90 tablet   2   . senna-docusate (SENOKOT-S) 8.6-50 MG per tablet   Oral   Take 2 tablets by mouth daily.   60 tablet   0     Allergies Review of patient's allergies indicates  no known allergies.  Family History  Problem Relation Age of Onset  . Hypertension Mother   . Diabetes    . Breast cancer Daughter     Social History Social History  Substance Use Topics  . Smoking status: Former Smoker    Quit date: 11/04/1983  . Smokeless tobacco: None  . Alcohol Use: No    Review of Systems Constitutional: Negative for fever. Cardiovascular: Intermittent left-sided chest pains over the past 3 days, none currently. Respiratory: Mild shortness of breath. Mild wheeze. Gastrointestinal: Negative for abdominal pain Genitourinary: Mild dysuria 3 days. Musculoskeletal: Negative for back pain. Neurological: Negative for headache 10-point ROS otherwise negative.  ____________________________________________   PHYSICAL EXAM:  VITAL SIGNS: ED Triage Vitals  Enc Vitals Group     BP 04/12/15 1244 125/57 mmHg     Pulse Rate 04/12/15 1244 69     Resp 04/12/15 1244 18     Temp 04/12/15 1244 98.1 F (36.7 C)     Temp Source 04/12/15 1244 Oral     SpO2 04/12/15 1244 99 %     Weight 04/12/15 1244 345 lb (156.491 kg)     Height 04/12/15 1244 5\' 4"  (1.626 m)     Head Cir --      Peak Flow --      Pain Score 04/12/15 1246 8     Pain Loc --      Pain Edu? --      Excl. in [place]? --     Constitutional: Alert and oriented. Well appearing and in no distress. Eyes: Normal exam ENT   Head: Normocephalic and atraumatic.   Mouth/Throat: Mucous membranes are moist. Cardiovascular: Normal rate, regular rhythm. No murmur Respiratory: Normal respiratory effort without tachypnea nor retractions. Slight expiratory wheeze bilaterally. No rales or rhonchi. Gastrointestinal: Soft and nontender. No distention.   Musculoskeletal: Nontender with normal range of motion in all extremities.  Neurologic:  Normal speech and language. No gross focal neurologic deficits Skin:  Skin is warm, dry and intact.  Psychiatric: Mood and affect are normal. Speech and behavior are  normal.  ____________________________________________    EKG  EKG reviewed and interpreted by myself shows normal sinus rhythm at 67 bpm, narrow QRS, normal axis, normal intervals, no ST changes. Overall normal EKG.  ____________________________________________    RADIOLOGY  Chest x-ray shows no active disease.   INITIAL IMPRESSION / ASSESSMENT AND PLAN / ED COURSE  Pertinent labs & imaging results that were available during my care of the patient were reviewed by me and considered in my medical decision making (see chart for details).  Patient presents with shortness of breath, intermittent cough and intermittent left-sided chest discomfort of the past several days. Exam shows mild expiratory wheeze, otherwise normal. We will check labs, chest x-ray, EKG, and closely monitor in the emergency department.  EKG and chest x-ray are reassuring. We will obtain labs including urinalysis and continue to closely  monitor. Given the patient's expiratory wheeze I treated her with a DuoNeb as well as IV Solu-Medrol while awaiting results. Currently satting 99% on her normal 2 L of oxygen.  Labs pending, difficult to obtain blood work. Pt. Care signed out to Dr. Edd Fabian  ____________________________________________   FINAL CLINICAL IMPRESSION(S) / ED DIAGNOSES  Acute bronchitis COPD exacerbation   Harvest Dark, MD 04/12/15 1521

## 2015-04-12 NOTE — ED Notes (Signed)
Brought by ems from [facility].  Has had cough and copd, but has chest pain on and off for few days.  Stabbing pain.

## 2015-04-13 DIAGNOSIS — M1990 Unspecified osteoarthritis, unspecified site: Secondary | ICD-10-CM

## 2015-04-13 DIAGNOSIS — J441 Chronic obstructive pulmonary disease with (acute) exacerbation: Principal | ICD-10-CM

## 2015-04-13 DIAGNOSIS — Z515 Encounter for palliative care: Secondary | ICD-10-CM

## 2015-04-13 DIAGNOSIS — M797 Fibromyalgia: Secondary | ICD-10-CM

## 2015-04-13 DIAGNOSIS — D649 Anemia, unspecified: Secondary | ICD-10-CM

## 2015-04-13 DIAGNOSIS — I10 Essential (primary) hypertension: Secondary | ICD-10-CM

## 2015-04-13 DIAGNOSIS — E876 Hypokalemia: Secondary | ICD-10-CM

## 2015-04-13 LAB — CBC
HEMATOCRIT: 33.5 % — AB (ref 35.0–47.0)
Hemoglobin: 10.6 g/dL — ABNORMAL LOW (ref 12.0–16.0)
MCH: 27.4 pg (ref 26.0–34.0)
MCHC: 31.7 g/dL — AB (ref 32.0–36.0)
MCV: 86.4 fL (ref 80.0–100.0)
Platelets: 228 10*3/uL (ref 150–440)
RBC: 3.88 MIL/uL (ref 3.80–5.20)
RDW: 19.9 % — AB (ref 11.5–14.5)
WBC: 4.5 10*3/uL (ref 3.6–11.0)

## 2015-04-13 LAB — BASIC METABOLIC PANEL
Anion gap: 6 (ref 5–15)
BUN: 8 mg/dL (ref 6–20)
CHLORIDE: 107 mmol/L (ref 101–111)
CO2: 28 mmol/L (ref 22–32)
CREATININE: 0.62 mg/dL (ref 0.44–1.00)
Calcium: 9.2 mg/dL (ref 8.9–10.3)
GFR calc Af Amer: >60 mL/min (ref >60)
GFR calc non Af Amer: >60 mL/min (ref >60)
GLUCOSE: 131 mg/dL — AB (ref 65–99)
POTASSIUM: 4.3 mmol/L (ref 3.5–5.1)
SODIUM: 141 mmol/L (ref 135–145)

## 2015-04-13 MED ORDER — AZITHROMYCIN 250 MG PO TABS
500.0000 mg | ORAL_TABLET | ORAL | Status: DC
  Administered 2015-04-13 – 2015-04-14 (×2): 500 mg via ORAL
  Filled 2015-04-13 (×2): qty 2

## 2015-04-13 NOTE — Progress Notes (Signed)
[place] [facility] [place] NAME: Loretta Wade    MR#:  [id number]  DATE OF BIRTH:  Dec 16, 1950  SUBJECTIVE:  CHIEF COMPLAINT:   Chief Complaint  Patient presents with  . Cough   Still cough, wheezing and shortness of breath. REVIEW OF SYSTEMS:  CONSTITUTIONAL: No fever, fatigue or weakness.  EYES: No blurred or double vision.  EARS, NOSE, AND THROAT: No tinnitus or ear pain.  RESPIRATORY: Has cough, shortness of breath, wheezing but no hemoptysis.  CARDIOVASCULAR: No chest pain, orthopnea, edema.  GASTROINTESTINAL: No nausea, vomiting, diarrhea or abdominal pain.  GENITOURINARY: No dysuria, hematuria.  ENDOCRINE: No polyuria, nocturia,  HEMATOLOGY: No anemia, easy bruising or bleeding SKIN: No rash or lesion. MUSCULOSKELETAL: No joint pain or arthritis.   NEUROLOGIC: No tingling, numbness, weakness.  PSYCHIATRY: No anxiety or depression.   DRUG ALLERGIES:  No Known Allergies  VITALS:  Blood pressure 121/58, pulse 50, temperature 97.7 F (36.5 C), temperature source Oral, resp. rate 20, height 5\' 4"  (1.626 m), weight 157.398 kg (347 lb), SpO2 97 %.  PHYSICAL EXAMINATION:  GENERAL:  65 y.o.-year-old patient lying in the bed with no acute distress. Morbid obese. EYES: Pupils equal, round, reactive to light and accommodation. No scleral icterus. Extraocular muscles intact.  HEENT: Head atraumatic, normocephalic. Oropharynx and nasopharynx clear.  NECK:  Supple, no jugular venous distention. No thyroid enlargement, no tenderness.  LUNGS: Normal breath sounds bilaterally, has expiratory wheezing, no rales,rhonchi or crepitation. No use of accessory muscles of respiration.  CARDIOVASCULAR: S1, S2 normal. No murmurs, rubs, or gallops.  ABDOMEN: Soft, nontender, nondistended. Bowel sounds present. No organomegaly or mass.  EXTREMITIES: No pedal edema, cyanosis, or clubbing.  NEUROLOGIC: Cranial nerves II through XII are intact. Muscle  strength 5/5 in all extremities. Sensation intact. Gait not checked.  PSYCHIATRIC: The patient is alert and oriented x 3.  SKIN: No obvious rash, lesion, or ulcer.    LABORATORY PANEL:   CBC  Recent Labs Lab 04/13/15 0636  WBC 4.5  HGB 10.6*  HCT 33.5*  PLT 228   ------------------------------------------------------------------------------------------------------------------  Chemistries   Recent Labs Lab 04/12/15 2056 04/13/15 0636  NA  --  141  K  --  4.3  CL  --  107  CO2  --  28  GLUCOSE  --  131*  BUN  --  8  CREATININE  --  0.62  CALCIUM  --  9.2  MG 1.9  --    ------------------------------------------------------------------------------------------------------------------  Cardiac Enzymes  Recent Labs Lab 04/12/15 1502  TROPONINI 0.03   ------------------------------------------------------------------------------------------------------------------  RADIOLOGY:  Dg Chest 2 View  04/12/2015  CLINICAL DATA:  Substernal chest pain.  Shortness of breath. EXAM: CHEST  2 VIEW COMPARISON:  January 30, 2015 FINDINGS: Stable cardiomediastinal silhouette with persistent cardiomegaly. Atelectasis in the left lung base. No other acute abnormalities are identified. IMPRESSION: No active cardiopulmonary disease. Electronically Signed   By: Dorise Bullion III M.D   On: 04/12/2015 13:28    EKG:   Orders placed or performed during the hospital encounter of 04/12/15  . ED EKG within 10 minutes  . ED EKG within 10 minutes  . EKG 12-Lead  . EKG 12-Lead  . EKG 12-Lead  . EKG 12-Lead    ASSESSMENT AND PLAN:   1. Chronic obstructive pulmonary disease exacerbation:  Continue Solu-Medrol, DuoNeb treatment and azithromycin. Continue with home medications.  2. Hypokalemia: improved with potassium supplement. 3. GERD without esophagitis PPI therapy 4. Morbid  obesity: Nutrition counseling  * Chronic respiratory failure. Continue home oxygen by nasal cannula 2  L.  All the records are reviewed and case discussed with Care Management/Social Workerr. Management plans discussed with the patient, family and they are in agreement.  CODE STATUS: Full code  TOTAL TIME TAKING CARE OF THIS PATIENT: 38 minutes.  Greater than 50% time was spent on coordination of care and face-to-face counseling.  POSSIBLE D/C IN 2 DAYS, DEPENDING ON CLINICAL CONDITION.   Demetrios Loll M.D on 04/13/2015 at 2:15 PM  Between 7am to 6pm - Pager - [phone]  After 6pm go to [email or web address] - password [email or web address] [facility]  [facility]  Office  [phone]  CC: Primary care physician; Keith Rake, MD

## 2015-04-13 NOTE — Plan of Care (Addendum)
Problem: Pain Managment: Goal: General experience of comfort will improve Outcome: Progressing Scheduled Pain Medicine Provided.  Pain Medicine Requested. Improvement Noted.  Problem: Physical Regulation: Goal: Will remain free from infection Outcome: Progressing Remains on Oral Antibiotics.

## 2015-04-13 NOTE — Telephone Encounter (Signed)
[name] has spoken with patient concerning this matter in office visit on 04/12/2015

## 2015-04-13 NOTE — Care Management Important Message (Signed)
Important Message  Patient Details  Name: AMAYA VANGORDER MRN: [id number] Date of Birth: [birth year]   Medicare Important Message Given:  Yes    Beau Fanny, RN 04/13/2015, 9:54 AM

## 2015-04-13 NOTE — Consult Note (Signed)
Palliative Medicine Inpatient Consult Note   Name: Loretta Wade Date: 04/13/2015 MRN: [id number]  DOB: 23-Sep-1950  Referring Physician: Demetrios Loll, MD  Palliative Care consult requested for this 65 y.o. female for goals of medical therapy in patient with a severe COPD exacerbation.  She also has chronic pain with fibromyalgia and osteoarthritis.  She had been a Hospice patient in her own home   Brief History: Loretta Wade is a 65 yo very morbidly obese woman with end stage COPD and dependent leg edema.   She also has chronic pain with fibromyalgia and osteoarthritis.  She had been a Hospice patient in her own home until Jan 17th, when she was discharged from [facility] --due to stability of condition.  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  The pt says she started going downhill after the 17th --and she blames part of this on not having her usual morphine RXs which she says helped her to breathe and also helped her to be a bit more mobile in the house.  She is unable to get opioid Rxs from her primary care doctor (Dr. Lorin Glass) --as he referred her to a pain clinic. Apparently, she has trouble getting to different doctors and she did not get set up going to a pain clinic. Recently, she became very short of breath and had loud wheezing and also a cough with green phlegm being produced. She had no fever or chills, however.    She is on 2 LPM nasal cannula O2 at home.  She is being treated with Zithromax, duonebs, and solumedrol. Her prior morphine doses were restarted, but of course, she has no one to prescribe this for her after she leaves here.    TODAY'S CONVERSATIONS AND DECISIONS:  No new decisions were made today.  We had a very pleasant talk and she asked appropriate questions.  However, pt will reflect on the following and we will discuss these matters again tomorrow:  ---code status  ---possibly naming a HCPOA (her son vs her sister) and  getting a Living Will with help of chaplain  ---she would like to be under Hospice care again, but she is not at end of life ----She needs a prescriber for her chronic pain meds.  She may need to go to a pain clinic or get a new primary MD who will write for her chronic pain meds.  I will RX 2 wks worth-- but not more.  We also talked about safety of these meds in the home since she has grandchildren who visit.  She cannot get TEDS or support hose on --no one there to help here. She continues with [facility] for treatment for the edema in her legs --but there are not open oozing areas on her legs currently.    She needs some careful modification and review of symptom meds.  I discussed making some changes today with Dr. Geryl Councilman and will also be glad to do her DC meds when the time comes --as discussed with him also.    IMPRESSION Severe COPD with exacerbation Very Morbid Obesity Dependent Edema Hypokalemia DJD Fibromyalgia GERD OSA Anemia --unclear cause Nocturia HTN Fatigue and weakness    REVIEW OF SYSTEMS:  She has no fever or chills and is coughing less, but she had terribly bad wheezing earlier. She says she hurst in her chest and back --her usual fibromyalgia pain.  No vomiting or nausea or diarrhea.  No rash.  No  confusion.  Increased weakness.  SPIRITUAL SUPPORT SYSTEM: little support.  SOCIAL HISTORY:  reports that she quit smoking about 31 years ago. She does not have any smokeless tobacco history on file. She reports that she does not drink alcohol or use illicit drugs.  Lives alone.  Has DME in home.  Has been in several different SNFs in past.  Was also a Hospice Pt --BUT she was discharged from Hospice in the home setting due to stability.  She is a current [facility] pt with home nursing.     LEGAL DOCUMENTS:  none  CODE STATUS: Full code  PAST MEDICAL HISTORY: Past Medical History  Diagnosis Date  . OA (osteoarthritis)   . Fibromyalgia   . Anemia   . Sleep apnea    . Fatigue   . Edema   . Esophageal reflux   . Nocturia   . Hypertension   . COPD (chronic obstructive pulmonary disease) ([place])     PAST SURGICAL HISTORY:  Past Surgical History  Procedure Laterality Date  . Cholecystectomy    . Total knee arthroplasty      right  . Vesicovaginal fistula closure w/ tah    . Abdominal hysterectomy      ALLERGIES:  has No Known Allergies.  MEDICATIONS:  Current Facility-Administered Medications  Medication Dose Route Frequency Provider Last Rate Last Dose  . acetaminophen (TYLENOL) tablet 650 mg  650 mg Oral Q6H PRN Lytle Butte, MD       Or  . acetaminophen (TYLENOL) suppository 650 mg  650 mg Rectal Q6H PRN Lytle Butte, MD      . azithromycin [facility]) tablet 500 mg  500 mg Oral Q24H Charlett Nose, RPH      . budesonide (PULMICORT) nebulizer solution 0.5 mg  0.5 mg Nebulization BID Lytle Butte, MD   0.5 mg at 04/13/15 0801  . cholecalciferol (VITAMIN D) tablet 1,000 Units  1,000 Units Oral Daily Lytle Butte, MD   1,000 Units at 04/13/15 [phone]  . citalopram (CELEXA) tablet 20 mg  20 mg Oral Daily Lytle Butte, MD   20 mg at 04/13/15 0906  . colesevelam [facility]) tablet 625 mg  625 mg Oral BID WC Lytle Butte, MD   625 mg at 04/13/15 0849  . enoxaparin (LOVENOX) injection 40 mg  40 mg Subcutaneous BID Lytle Butte, MD   40 mg at 04/13/15 [id number]  . fluticasone (FLONASE) 50 MCG/ACT nasal spray 2 spray  2 spray Each Nare Daily PRN Lytle Butte, MD   2 spray at 04/13/15 0912  . guaiFENesin-dextromethorphan (ROBITUSSIN DM) 100-10 MG/5ML syrup 5-10 mL  5-10 mL Oral Q4H PRN Lytle Butte, MD      . HYDROcodone-acetaminophen (NORCO/VICODIN) 5-325 MG per tablet 1-2 tablet  1-2 tablet Oral Q8H PRN Lytle Butte, MD      . ipratropium-albuterol (DUONEB) 0.5-2.5 (3) MG/3ML nebulizer solution 3 mL  3 mL Nebulization Q4H PRN Lytle Butte, MD      . magnesium oxide (MAG-OX) tablet 400 mg  400 mg Oral Daily Lytle Butte, MD   400 mg at 04/13/15  [id number]  . methylPREDNISolone sodium succinate (SOLU-MEDROL) 125 mg/2 mL injection 60 mg  60 mg Intravenous Daily Lytle Butte, MD   60 mg at 04/13/15 0905  . mometasone-formoterol (DULERA) 100-5 MCG/ACT inhaler 2 puff  2 puff Inhalation BID Lytle Butte, MD   2 puff at 04/13/15 [phone]  . morphine (MS CONTIN)  12 hr tablet 15 mg  15 mg Oral BID Lytle Butte, MD   15 mg at 04/13/15 0847  . morphine (MS CONTIN) 12 hr tablet 30 mg  30 mg Oral BID Lytle Butte, MD   30 mg at 04/13/15 0907  . morphine 2 MG/ML injection 2 mg  2 mg Intravenous Q4H PRN Lytle Butte, MD      . ondansetron [facility]) tablet 4 mg  4 mg Oral Q6H PRN Lytle Butte, MD       Or  . ondansetron [facility]) injection 4 mg  4 mg Intravenous Q6H PRN Lytle Butte, MD      . oxybutynin (DITROPAN) tablet 5 mg  5 mg Oral Daily Lytle Butte, MD   5 mg at 04/13/15 0847  . pantoprazole (PROTONIX) EC tablet 40 mg  40 mg Oral Daily Lytle Butte, MD   40 mg at 04/13/15 0847  . pregabalin (LYRICA) capsule 150 mg  150 mg Oral BID Lytle Butte, MD   150 mg at 04/13/15 0847  . propranolol ER (INDERAL LA) 24 hr capsule 80 mg  80 mg Oral Daily Lytle Butte, MD   80 mg at 04/12/15 1909  . rOPINIRole (REQUIP) tablet 0.75 mg  0.75 mg Oral QHS Lytle Butte, MD   0.75 mg at 04/12/15 2250  . tiotropium (SPIRIVA) inhalation capsule 18 mcg  18 mcg Inhalation Daily Lytle Butte, MD   18 mcg at 04/13/15 0851    Vital Signs: BP 121/58 mmHg  Pulse 50  Temp(Src) 97.7 F (36.5 C) (Oral)  Resp 20  Ht 5\' 4"  (1.626 m)  Wt 157.398 kg (347 lb)  BMI 59.53 kg/m2  SpO2 97% Filed Weights   04/12/15 1244 04/12/15 1823 04/12/15 2055  Weight: 156.491 kg (345 lb) 155.13 kg (342 lb) 157.398 kg (347 lb)    Estimated body mass index is 59.53 kg/(m^2) as calculated from the following:   Height as of this encounter: 5\' 4"  (1.626 m).   Weight as of this encounter: 157.398 kg (347 lb).  PERFORMANCE STATUS (ECOG) : 3 - Symptomatic, >50% confined to bed  PHYSICAL  EXAM: Resting in medical bed in NAD --with nasal cannula oxygen in place Morbidly obese Very pleasant  EOMI OP clear Neck w/o JVD or TM Hrt rrr no mgr Lungs with occas wheezes but these are subtle now  Abd obese nontender , nl BS Ext unna boots noted bilat         LABS: CBC:    Component Value Date/Time   WBC 4.5 04/13/2015 0636   WBC 6.1 03/20/2014 1922   HGB 10.6* 04/13/2015 0636   HGB 12.2 03/20/2014 1922   HCT 33.5* 04/13/2015 0636   HCT 39.2 03/20/2014 1922   PLT 228 04/13/2015 0636   PLT 185 03/20/2014 1922   MCV 86.4 04/13/2015 0636   MCV 93 03/20/2014 1922   NEUTROABS 2.3 01/28/2015 0414   NEUTROABS 1.5 08/04/2013 0747   LYMPHSABS 1.4 01/28/2015 0414   LYMPHSABS 1.9 08/04/2013 0747   MONOABS 0.8 01/28/2015 0414   MONOABS 0.6 08/04/2013 0747   EOSABS 0.1 01/28/2015 0414   EOSABS 0.1 08/04/2013 0747   BASOSABS 0.0 01/28/2015 0414   BASOSABS 0.0 08/04/2013 0747   Comprehensive Metabolic Panel:    Component Value Date/Time   NA 141 04/13/2015 0636   NA 142 03/20/2014 1922   K 4.3 04/13/2015 0636   K 4.2 03/20/2014 1922   CL 107  04/13/2015 0636   CL 106 03/20/2014 1922   CO2 28 04/13/2015 0636   CO2 31 03/20/2014 1922   BUN 8 04/13/2015 0636   BUN 13 03/20/2014 1922   CREATININE 0.62 04/13/2015 0636   CREATININE 1.13 03/20/2014 1922   GLUCOSE 131* 04/13/2015 0636   GLUCOSE 127* 03/20/2014 1922   CALCIUM 9.2 04/13/2015 0636   CALCIUM 9.2 03/20/2014 1922   AST 17 01/28/2015 0430   AST 31 03/20/2014 1922   ALT 13* 01/28/2015 0430   ALT 42 03/20/2014 1922   ALKPHOS 77 01/28/2015 0430   ALKPHOS 121* 03/20/2014 1922   BILITOT 0.5 01/28/2015 0430   BILITOT 0.5 03/20/2014 1922   PROT 6.6 01/28/2015 0430   PROT 7.0 03/20/2014 1922   ALBUMIN 3.5 01/28/2015 0430   ALBUMIN 3.0* 03/20/2014 1922    More than 50% of the visit was spent in counseling/coordination of care: Yes  Time Spent: 80 minutes

## 2015-04-13 NOTE — Progress Notes (Signed)
Visit made. Patient is currently followed at home by [facility] with a diagnosis of CHF. She is a Full Code. She is currently receiving nursing and physical therapy services. Patient admitted to [facility] on 2/6 for evaluation and treatment of chest pain and COPD exacerbation. She is receiving oral antibiotics and IV steroids. Per chart note review white count and troponin are WNL.  Visit made, patient seen lying in bed, alert and interactive. Reports she is still having some chest pain, has requested PRN oxycodone 5 mg once since this morning.  She has been restarted on her previous home dose of MS contin 45 mg BID which "helps her breathing" as well as control her pain. Patient reports that her physician does not provide prescriptions for this pain medication. She has received hospice services in the past and was discharged do to stability, as she reports the "medicince worked so that I could sweep a little and get around a little" Patient reports poor appetite, lunch tray at bedside, she ate approximately 50%. She is getting up to the bedside commode with assistance. Patient has a Palliative Care consult pending. Will continue to follow and update [facility] team. CMRN Hassan Rowan made aware that patient is followed by [facility] home [facility]. Transfer summary in place on hospital chart. Thank you. Flo Shanks RN, BSN, [facility] Liaison [phone]

## 2015-04-13 NOTE — Care Management (Signed)
Admitted to [facility] with the diagnosis of COPD. Lives alone. Sister is Lelon Frohlich [phone]). Direct admit from Dr. Trena Platt office yesterday. Followed by [facility] until 03/23/15. Transitioned to [facility] at that time. [facility] in the past. Chronic oxygen 2liters per nasal cannula x 2-3 years thru [facility]. [place] last September x 2 weeks. [facility] in the past. Wheelchair, rolling walker, hospital bed, and nebulizer in the home. No falls. " No good appetite." Shelbie Ammons RN MSN [place] [facility] [phone]

## 2015-04-13 NOTE — Evaluation (Signed)
Physical Therapy Evaluation Patient Details Name: Loretta Wade MRN: [id number] DOB: [birth year] Today's Date: 04/13/2015   History of Present Illness  Pt admitted for COPD exacerbation. Pt with complaints of cough and SOB. Pt with history of COPD, fibromyalgia and anemia. Pt currently lives alone at this time and was previously followed by hospice. Pt now receiving HHPT through [facility].  Clinical Impression  Pt is a pleasant 65 year old female who was admitted for COPD exacerbation. After session completed, therapist noted order dc'd. Will ask RN to re-initiate to continue therapy services, if indicated. Pt performs bed mobility with mod I, transfers with cga, and ambulation with cga and BRW. Pt on 2L of O2 at this time. Pt demonstrates deficits with endurance/strength/mobility. Would benefit from skilled PT to address above deficits and promote optimal return to PLOF; recommend transition to STR upon discharge from acute hospitalization to improve endurance/mobility efforts.       Follow Up Recommendations SNF    Equipment Recommendations       Recommendations for Other Services       Precautions / Restrictions Precautions Precautions: Fall Restrictions Weight Bearing Restrictions: No      Mobility  Bed Mobility Overal bed mobility: Modified Independent             General bed mobility comments: safe technique performed with pt able to sit on EOB without SOB.  Transfers Overall transfer level: Needs assistance Equipment used: Rolling walker (2 wheeled) Transfers: Sit to/from Stand Sit to Stand: Min guard         General transfer comment: transfers performed using BRW. Safe technique performed. Mobility performed on room air as [place] too short.  Ambulation/Gait Ambulation/Gait assistance: Min guard Ambulation Distance (Feet): 5 Feet Assistive device:  (BRW) Gait Pattern/deviations: Step-to pattern     General Gait Details: ambulated to recliner with short  step to gait pattern. Pt able to manuever rw and ambulate turns with safe technique. Pt fatigues quickly with exertion and unable to go further distance at this time. O2 sats at 87% post mobility. O2 donned with sats improving to 90%.  Stairs            Wheelchair Mobility    Modified Rankin (Stroke Patients Only)       Balance Overall balance assessment: Needs assistance Sitting-balance support: Feet supported Sitting balance-[name] Scale: Normal     Standing balance support: Bilateral upper extremity supported Standing balance-[name] Scale: Good                               Pertinent Vitals/Pain Pain Assessment: No/denies pain    Home Living Family/patient expects to be discharged to:: Private residence Living Arrangements: Alone Available Help at Discharge: Family Type of Home: House Home Access:  (TBD)       Home Equipment: Dan Humphreys - 2 wheels;Wheelchair - manual;Hospital bed      Prior Function Level of Independence: Independent with assistive device(s)               Hand Dominance        Extremity/Trunk Assessment   Upper Extremity Assessment: Generalized weakness (grossly 3+/5)           Lower Extremity Assessment: Generalized weakness (grossly 3+/5)         Communication   Communication: No difficulties  Cognition Arousal/Alertness: Awake/alert Behavior During Therapy: WFL for tasks assessed/performed Overall Cognitive Status: Within Functional Limits for tasks assessed  General Comments General comments (skin integrity, edema, etc.): B LE unna boots applied    Exercises Other Exercises Other Exercises: Pt performed seated ther-ex including B ankle pumps, quad sets, and LAQ. All ther-ex performed x 10 reps with cues for correct technique while providing supervision.      Assessment/Plan    PT Assessment Patient needs continued PT services  PT Diagnosis Difficulty walking;Abnormality  of gait;Generalized weakness   PT Problem List Decreased strength;Decreased activity tolerance;Decreased balance;Decreased mobility  PT Treatment Interventions Gait training;Therapeutic activities;Therapeutic exercise   PT Goals (Current goals can be found in the Care Plan section) Acute Rehab PT Goals Patient Stated Goal: to go home-doesn't want rehab PT Goal Formulation: With patient Time For Goal Achievement: 04/27/15 Potential to Achieve Goals: Good    Frequency Min 2X/week   Barriers to discharge        Co-evaluation               End of Session Equipment Utilized During Treatment: Gait belt;Oxygen Activity Tolerance: Patient tolerated treatment well Patient left: in chair;with chair alarm set Nurse Communication: Mobility status         Time: 4401-0272 PT Time Calculation (min) (ACUTE ONLY): 15 min   Charges:   PT Evaluation $PT Eval Moderate Complexity: 1 Procedure PT Treatments $Therapeutic Exercise: 8-22 mins   PT G Codes:        Evert Wenrich 05-11-15, 4:51 PM  Elizabeth Palau, PT, DPT [phone]

## 2015-04-13 NOTE — Progress Notes (Signed)
PHARMACIST - PHYSICIAN COMMUNICATION CONCERNING: Antibiotic IV to Oral Route Change Policy  RECOMMENDATION: This patient is receiving azithromycin by the intravenous route.  Based on criteria approved by the Pharmacy and Therapeutics Committee, the antibiotic(s) is/are being converted to the equivalent oral dose form(s).   DESCRIPTION: These criteria include:  Patient being treated for a respiratory tract infection, urinary tract infection, cellulitis or clostridium difficile associated diarrhea if on metronidazole  The patient is not neutropenic and does not exhibit a GI malabsorption state  The patient is eating (either orally or via tube) and/or has been taking other orally administered medications for a least 24 hours  The patient is improving clinically and has a Tmax < 100.5  If you have questions about this conversion, please contact the Pharmacy Department  []  ( [phone] )  [place] [x]  ( [phone] )  [facility] []  ( [phone]  [place] []  ( [phone] )  [facility] []  ( [phone] )  [facility]  

## 2015-04-14 LAB — C DIFFICILE QUICK SCREEN W PCR REFLEX
C DIFFICILE (CDIFF) INTERP: NEGATIVE
C Diff antigen: NEGATIVE
C Diff toxin: NEGATIVE

## 2015-04-14 LAB — MRSA PCR SCREENING: MRSA by PCR: POSITIVE — AB

## 2015-04-14 LAB — GLUCOSE, CAPILLARY: GLUCOSE-CAPILLARY: 94 mg/dL (ref 65–99)

## 2015-04-14 MED ORDER — TIOTROPIUM BROMIDE MONOHYDRATE 18 MCG IN CAPS
18.0000 ug | ORAL_CAPSULE | Freq: Every day | RESPIRATORY_TRACT | Status: DC
  Administered 2015-04-14 – 2015-04-15 (×2): 18 ug via RESPIRATORY_TRACT
  Filled 2015-04-14: qty 5

## 2015-04-14 MED ORDER — BUDESONIDE 0.5 MG/2ML IN SUSP
0.5000 mg | Freq: Two times a day (BID) | RESPIRATORY_TRACT | Status: DC
  Administered 2015-04-14 – 2015-04-15 (×3): 0.5 mg via RESPIRATORY_TRACT
  Filled 2015-04-14 (×2): qty 2

## 2015-04-14 MED ORDER — CHLORHEXIDINE GLUCONATE CLOTH 2 % EX PADS
6.0000 | MEDICATED_PAD | Freq: Every day | CUTANEOUS | Status: DC
  Administered 2015-04-15: 6 via TOPICAL

## 2015-04-14 MED ORDER — MUPIROCIN 2 % EX OINT
1.0000 "application " | TOPICAL_OINTMENT | Freq: Two times a day (BID) | CUTANEOUS | Status: DC
  Administered 2015-04-14 – 2015-04-15 (×2): 1 via NASAL
  Filled 2015-04-14: qty 22

## 2015-04-14 NOTE — NC FL2 (Signed)
[place] LEVEL OF CARE SCREENING TOOL     IDENTIFICATION  Patient Name: Loretta Wade Birthdate: 1950-12-31 Sex: female Admission Date (Current Location): 04/12/2015  [place] and [place] Number:  Engineering geologist and Address:  [facility], [address], [place], [place] [address]      Provider Number: [id number]  Attending Physician Name and Address:  Demetrios Loll, MD  Relative Name and Phone Number:       Current Level of Care: Hospital Recommended Level of Care: [place] Prior Approval Number:    Date Approved/Denied:   PASRR Number: [id number]  Discharge Plan: SNF    Current Diagnoses: Patient Active Problem List   Diagnosis Date Noted  . COPD exacerbation ([place]) 04/12/2015  . Poor mobility 04/12/2015  . Fibromyalgia 03/23/2015  . Esophageal reflux 03/23/2015  . MRSA infection 11/17/2014  . Diastolic CHF, acute on chronic (HCC) 11/12/2014  . Morbid obesity with BMI of 50.0-59.9, adult ([place]) 11/12/2014  . Neck pain, chronic 11/12/2014  . COPD, frequent exacerbations ([place]) 11/04/2014    Orientation RESPIRATION BLADDER Height & Weight     Self, Time, Situation  O2 (2L) Incontinent Weight: (!) 347 lb (157.398 kg) Height:  5\' 4"  (162.6 cm)  BEHAVIORAL SYMPTOMS/MOOD NEUROLOGICAL BOWEL NUTRITION STATUS      Incontinent Diet (Heart Health)  AMBULATORY STATUS COMMUNICATION OF NEEDS Skin   Extensive Assist   Normal                       Personal Care Assistance Level of Assistance  Bathing, Feeding, Dressing Bathing Assistance: Maximum assistance Feeding assistance: Maximum assistance Dressing Assistance: Maximum assistance     Functional Limitations Info  Sight, Hearing, Speech Sight Info: Adequate Hearing Info: Adequate Speech Info: Adequate    SPECIAL CARE FACTORS FREQUENCY  PT (By licensed PT)     PT Frequency: 5              Contractures      Additional Factors Info   Code Status, Allergies, Isolation Precautions Code Status Info: Full Code Allergies Info: No known allergies     Isolation Precautions Info: Contact isolation for MRSA     Current Medications (04/14/2015):  This is the current hospital active medication list Current Facility-Administered Medications  Medication Dose Route Frequency Provider Last Rate Last Dose  . acetaminophen (TYLENOL) tablet 650 mg  650 mg Oral Q6H PRN Lytle Butte, MD       Or  . acetaminophen (TYLENOL) suppository 650 mg  650 mg Rectal Q6H PRN Lytle Butte, MD      . azithromycin [facility]) tablet 500 mg  500 mg Oral Q24H Demetrios Loll, MD   500 mg at 04/13/15 1707  . budesonide (PULMICORT) nebulizer solution 0.5 mg  0.5 mg Nebulization BID Lytle Butte, MD   0.5 mg at 04/14/15 0716  . cholecalciferol (VITAMIN D) tablet 1,000 Units  1,000 Units Oral Daily Lytle Butte, MD   1,000 Units at 04/14/15 0950  . citalopram (CELEXA) tablet 20 mg  20 mg Oral Daily Lytle Butte, MD   20 mg at 04/14/15 0950  . colesevelam [facility]) tablet 625 mg  625 mg Oral BID WC Lytle Butte, MD   625 mg at 04/14/15 [id number]  . enoxaparin (LOVENOX) injection 40 mg  40 mg Subcutaneous BID Lytle Butte, MD   40 mg at 04/14/15 0954  . fluticasone (FLONASE) 50 MCG/ACT nasal  spray 2 spray  2 spray Each Nare Daily PRN Lytle Butte, MD   2 spray at 04/13/15 0912  . guaiFENesin-dextromethorphan (ROBITUSSIN DM) 100-10 MG/5ML syrup 5-10 mL  5-10 mL Oral Q4H PRN Lytle Butte, MD      . HYDROcodone-acetaminophen (NORCO/VICODIN) 5-325 MG per tablet 1-2 tablet  1-2 tablet Oral Q8H PRN Lytle Butte, MD   2 tablet at 04/14/15 [phone]  . ipratropium-albuterol (DUONEB) 0.5-2.5 (3) MG/3ML nebulizer solution 3 mL  3 mL Nebulization Q4H PRN Lytle Butte, MD   3 mL at 04/14/15 0717  . magnesium oxide (MAG-OX) tablet 400 mg  400 mg Oral Daily Lytle Butte, MD   400 mg at 04/14/15 0949  . methylPREDNISolone sodium succinate (SOLU-MEDROL) 125 mg/2 mL injection 60 mg   60 mg Intravenous Daily Lytle Butte, MD   60 mg at 04/14/15 0954  . mometasone-formoterol (DULERA) 100-5 MCG/ACT inhaler 2 puff  2 puff Inhalation BID Lytle Butte, MD   2 puff at 04/14/15 [phone]  . morphine (MS CONTIN) 12 hr tablet 15 mg  15 mg Oral BID Lytle Butte, MD   15 mg at 04/14/15 0949  . morphine (MS CONTIN) 12 hr tablet 30 mg  30 mg Oral BID Lytle Butte, MD   30 mg at 04/14/15 0950  . morphine 2 MG/ML injection 2 mg  2 mg Intravenous Q4H PRN Lytle Butte, MD   2 mg at 04/13/15 1931  . ondansetron (ZOFRAN) tablet 4 mg  4 mg Oral Q6H PRN Lytle Butte, MD       Or  . ondansetron [facility]) injection 4 mg  4 mg Intravenous Q6H PRN Lytle Butte, MD      . oxybutynin (DITROPAN) tablet 5 mg  5 mg Oral Daily Lytle Butte, MD   5 mg at 04/14/15 0949  . pantoprazole (PROTONIX) EC tablet 40 mg  40 mg Oral Daily Lytle Butte, MD   40 mg at 04/14/15 0949  . pregabalin (LYRICA) capsule 150 mg  150 mg Oral BID Lytle Butte, MD   150 mg at 04/14/15 0949  . propranolol ER (INDERAL LA) 24 hr capsule 80 mg  80 mg Oral Daily Lytle Butte, MD   80 mg at 04/14/15 0948  . rOPINIRole (REQUIP) tablet 0.75 mg  0.75 mg Oral QHS Lytle Butte, MD   0.75 mg at 04/13/15 2118  . tiotropium (SPIRIVA) inhalation capsule 18 mcg  18 mcg Inhalation Daily Demetrios Loll, MD   18 mcg at 04/14/15 0825     Discharge Medications: Please see discharge summary for a list of discharge medications.  Relevant Imaging Results:  Relevant Lab Results:   Additional Information SSN:  [id number]  [name], LCSW

## 2015-04-14 NOTE — Clinical Social Work Note (Signed)
Clinical Social Work Assessment  Patient Details  Name: Loretta Wade MRN: [id number] Date of Birth: Feb 08, 1951  Date of referral:  04/14/15               Reason for consult:  Facility Placement                Permission sought to share information with:  Family Supports Permission granted to share information::  Yes, Verbal Permission Granted  Name::     Erlene Quan, son   Housing/Transportation Living arrangements for the past 2 months:  Apartment Source of Information:  Patient Patient Interpreter Needed:  None Criminal Activity/Legal Involvement Pertinent to Current Situation/Hospitalization:  No - Comment as needed Significant Relationships:  Adult Children, Other Family Members Lives with:  Self Do you feel safe going back to the place where you live?  Yes Need for family participation in patient care:  No (Coment)  Care giving concerns:  No care giving concerns identified.   Social Worker assessment / plan:  CSW met with pt to address consult for New SNF. CSW introduced herself and explained role of social work. CSW also explained the process of discharging to SNF. Pt [facility] at [facility]. PT is recommending SNF. CSW initaited SNF search and called [facility] as pt only would like to go to [facility] Admissions at facility is reviewing referral. CSW will follow up with bed offer. Pt stated that she would opted to go home if she was unable to go to [facility]. CSW will continue to follow.   Employment status:  Disabled (Comment on whether or not currently receiving Disability) Insurance information:  Managed Medicare PT Recommendations:  [place] / Referral to community resources:  [place]  Patient/Family's Response to care:  Pt was appreciative of CSW support.   Patient/Family's Understanding of and Emotional Response to Diagnosis, Current Treatment, and Prognosis:  Pt would like to go to rehab, however will  return home with [facility] if [facility] is unable to accept pt.   Emotional Assessment Appearance:  Appears older than stated age Attitude/Demeanor/Rapport:  Other (Appropriate) Affect (typically observed):  Pleasant Orientation:  Oriented to Place, Oriented to  Time, Oriented to Situation, Oriented to Self Alcohol / Substance use:  Never Used Psych involvement (Current and /or in the community):  No (Comment)  Discharge Needs  Concerns to be addressed:  Adjustment to Illness Readmission within the last 30 days:  No Current discharge risk:  Chronically ill Barriers to Discharge:  Continued Medical Work up   [employer], LCSW 04/14/2015, 3:32 PM

## 2015-04-14 NOTE — Progress Notes (Signed)
Visit made. Patient lying in bed, alert and oriented, oxygen in place at 2 liters via nasal cannula. Patient able to carry on a conversation with with appearing short of breath. She complains of abdominal pain which she reported to attending physician, she also reports diarrhea which she reports "I had some last week". She remains on IV steroids and oral antibiotics. Abdominal pain and diarrhea reported to staff RN Daleen Snook.  Writer spoke also spoke with patient regarding going to the pain clinic for her morphine prescriptions, she stated " it is too hard for me to get ready to go anywhere be cause I can't catch my breath".Per chart note review Physical Therapy evaluation on 2/7 is recommending SNF for rehab. Patient is aware and states she would consider [facility]. CMRN Hassan Rowan made aware. Will continue to follow through final disposition.  Flo Shanks, RN, BSN, [facility] Liaison [phone] c

## 2015-04-14 NOTE — Plan of Care (Signed)
Problem: Pain Managment: Goal: General experience of comfort will improve Outcome: Progressing Norco 2 tablets given for generalized pain. Improvement noted.

## 2015-04-14 NOTE — Progress Notes (Signed)
[place] [facility] [place] NAME: Loretta Wade    MR#:  [id number]  DATE OF BIRTH:  January 30, 1951  SUBJECTIVE:  CHIEF COMPLAINT:   Chief Complaint  Patient presents with  . Cough   Better cough, wheezing and shortness of breath. But had abdominal pain and diarrhea last night. REVIEW OF SYSTEMS:  CONSTITUTIONAL: No fever, fatigue or weakness.  EYES: No blurred or double vision.  EARS, NOSE, AND THROAT: No tinnitus or ear pain.  RESPIRATORY: Has cough, shortness of breath, wheezing but no hemoptysis.  CARDIOVASCULAR: No chest pain, orthopnea, edema.  GASTROINTESTINAL: No nausea, vomiting, had diarrhea and abdominal pain.  GENITOURINARY: No dysuria, hematuria.  ENDOCRINE: No polyuria, nocturia,  HEMATOLOGY: No anemia, easy bruising or bleeding SKIN: No rash or lesion. MUSCULOSKELETAL: No joint pain or arthritis.   NEUROLOGIC: No tingling, numbness, weakness.  PSYCHIATRY: No anxiety or depression.   DRUG ALLERGIES:  No Known Allergies  VITALS:  Blood pressure 123/53, pulse 50, temperature 97.8 F (36.6 C), temperature source Oral, resp. rate 22, height 5\' 4"  (1.626 m), weight 157.398 kg (347 lb), SpO2 98 %.  PHYSICAL EXAMINATION:  GENERAL:  65 y.o.-year-old patient lying in the bed with no acute distress. Morbid obese. EYES: Pupils equal, round, reactive to light and accommodation. No scleral icterus. Extraocular muscles intact.  HEENT: Head atraumatic, normocephalic. Oropharynx and nasopharynx clear.  NECK:  Supple, no jugular venous distention. No thyroid enlargement, no tenderness.  LUNGS: Normal breath sounds bilaterally, has expiratory wheezing, no rales,rhonchi or crepitation. No use of accessory muscles of respiration.  CARDIOVASCULAR: S1, S2 normal. No murmurs, rubs, or gallops.  ABDOMEN: Soft, tenderness on middle part, nondistended. Bowel sounds present. No organomegaly or mass.  EXTREMITIES: No pedal edema, cyanosis, or  clubbing.  NEUROLOGIC: Cranial nerves II through XII are intact. Muscle strength 5/5 in all extremities. Sensation intact. Gait not checked.  PSYCHIATRIC: The patient is alert and oriented x 3.  SKIN: No obvious rash, lesion, or ulcer.    LABORATORY PANEL:   CBC  Recent Labs Lab 04/13/15 0636  WBC 4.5  HGB 10.6*  HCT 33.5*  PLT 228   ------------------------------------------------------------------------------------------------------------------  Chemistries   Recent Labs Lab 04/12/15 2056 04/13/15 0636  NA  --  141  K  --  4.3  CL  --  107  CO2  --  28  GLUCOSE  --  131*  BUN  --  8  CREATININE  --  0.62  CALCIUM  --  9.2  MG 1.9  --    ------------------------------------------------------------------------------------------------------------------  Cardiac Enzymes  Recent Labs Lab 04/12/15 1502  TROPONINI 0.03   ------------------------------------------------------------------------------------------------------------------  RADIOLOGY:  No results found.  EKG:   Orders placed or performed during the hospital encounter of 04/12/15  . ED EKG within 10 minutes  . ED EKG within 10 minutes  . EKG 12-Lead  . EKG 12-Lead  . EKG 12-Lead  . EKG 12-Lead    ASSESSMENT AND PLAN:   1. Chronic obstructive pulmonary disease exacerbation:  discontinue Solu-Medrol, start prednisone, continue DuoNeb treatment and azithromycin. Continue with home medications.  2. Hypokalemia: improved with potassium supplement. 3. GERD without esophagitis PPI therapy 4. Morbid obesity: Nutrition counseling  * Chronic respiratory failure. Continue home oxygen by nasal cannula 2 L. * Abodominal pain and diarrhea. Check stool C diff.  All the records are reviewed and case discussed with Care Management/Social Workerr. Management plans discussed with the patient, family and they are in agreement.  CODE STATUS: Full code  TOTAL TIME TAKING CARE OF THIS PATIENT: 38 minutes.   Greater than 50% time was spent on coordination of care and face-to-face counseling.  POSSIBLE D/C IN 2 DAYS, DEPENDING ON CLINICAL CONDITION.   Demetrios Loll M.D on 04/14/2015 at 4:19 PM  Between 7am to 6pm - Pager - [phone]  After 6pm go to [email or web address] - password [email or web address] [facility]  [facility]  Office  [phone]  CC: Primary care physician; Keith Rake, MD

## 2015-04-15 MED ORDER — PREDNISONE 10 MG PO TABS: ORAL_TABLET | ORAL | Status: DC

## 2015-04-15 MED ORDER — MORPHINE SULFATE ER 30 MG PO TBCR: 30.0000 mg | EXTENDED_RELEASE_TABLET | Freq: Two times a day (BID) | ORAL | Status: DC

## 2015-04-15 MED ORDER — HYDROCODONE-ACETAMINOPHEN 5-325 MG PO TABS: 1.0000 | ORAL_TABLET | Freq: Three times a day (TID) | ORAL | Status: DC | PRN

## 2015-04-15 MED ORDER — MORPHINE SULFATE ER 15 MG PO TBCR: 15.0000 mg | EXTENDED_RELEASE_TABLET | Freq: Two times a day (BID) | ORAL | Status: DC

## 2015-04-15 NOTE — Clinical Social Work Note (Signed)
Pt is ready for discharge today. [facility] does not have a bed available. Pt is aware. Pt has opted to discharge home with [facility]. CSW updated RN and RNCM. CSW is signing off as no further needs identified.   [name], MSW, LCSW  Clinical Social Worker  [phone]

## 2015-04-15 NOTE — Discharge Instructions (Signed)
You have been seen in the emergency department today for a cough and chest discomfort. Your workup is most consistent with a COPD exacerbation. Please take antibiotics and steroids as prescribed. Please use her home nebulizer treatments 4 times daily. Please follow-up with her primary care physician in 2 days for recheck/reevaluation. Return to the emergency department for any worsening trouble breathing, chest pain, or any other symptom personally concerning to yourself.   Chronic Obstructive Pulmonary Disease Exacerbation Chronic obstructive pulmonary disease (COPD) is a common lung problem. In COPD, the flow of air from the lungs is limited. COPD exacerbations are times that breathing gets worse and you need extra treatment. Without treatment they can be life threatening. If they happen often, your lungs can become more damaged. If your COPD gets worse, your doctor may treat you with:  Medicines.  Oxygen.  Different ways to clear your airway, such as using a mask. HOME CARE  Do not smoke.  Avoid tobacco smoke and other things that bother your lungs.  If given, take your antibiotic medicine as told. Finish the medicine even if you start to feel better.  Only take medicines as told by your doctor.  Drink enough fluids to keep your pee (urine) clear or pale yellow (unless your doctor has told you not to).  Use a cool mist machine (vaporizer).  If you use oxygen or a machine that turns liquid medicine into a mist (nebulizer), continue to use them as told.  Keep up with shots (vaccinations) as told by your doctor.  Exercise regularly.  Eat healthy foods.  Keep all doctor visits as told. GET HELP RIGHT AWAY IF:  You are very short of breath and it gets worse.  You have trouble talking.  You have bad chest pain.  You have blood in your spit (sputum).  You have a fever.  You keep throwing up (vomiting).  You feel weak, or you pass out (faint).  You feel confused.  You  keep getting worse. MAKE SURE YOU:  Understand these instructions.  Will watch your condition.  Will get help right away if you are not doing well or get worse.   This information is not intended to replace advice given to you by your health care provider. Make sure you discuss any questions you have with your health care provider.   Document Released: 02/09/2011 Document Revised: 03/13/2014 Document Reviewed: 10/25/2012 Elsevier Interactive Patient Education 2016 [place]. Activity as tolerated. Continue home O2 [place] 2L.  [facility]PT ([facility])

## 2015-04-15 NOTE — Care Management Important Message (Signed)
Important Message  Patient Details  Name: Loretta Wade MRN: [id number] Date of Birth: Dec 08, 1950   Medicare Important Message Given:  Yes    Shelbie Ammons, RN 04/15/2015, 11:58 AM

## 2015-04-15 NOTE — Progress Notes (Signed)
Palliative Medicine Inpatient Consult Follow Up Note   Name: Loretta Wade Date: 04/15/2015 MRN: [id number]  DOB: 08/18/1950  Referring Physician: No att. providers found  Palliative Care consult requested for this 65 y.o. female for goals of medical therapy in patient with .  severe COPD exacerbation. She also has chronic pain with fibromyalgia and osteoarthritis. She had been a Hospice patient in her own home BUT she was recently discharged from [facility] and changed to be a [facility] (home health) pt.    TODAY'S DISCUSSIONS AND DECISIONS: 1.  Pt says she will keep her code status 'full code'.  I had asked her to think about this. 2.  She would like to go to a facility she says, but there isn't one that is acceptable to her that has a bed so she will instead go home with [facility] to resume. 3.  She is advised that since she will be at home, she will have no choice but to go to a pain clinic to get her MS CONTIN and other narcotic meds which have been helping her breathe well and treat her pain.  4.  I contacted the Chaplain's office about helping pt complete a HCPOA and LIVING WILL form.  I am not sure if this got done before her discharge --or not.    IMPRESSION Severe COPD with exacerbation Very Morbid Obesity Dependent Edema Hypokalemia DJD Fibromyalgia GERD OSA Anemia --unclear cause Nocturia HTN Fatigue and weakness      CODE STATUS: Full code --PT CHOSE TO REMAIN FULL CODE TODAY   PAST MEDICAL HISTORY: Past Medical History  Diagnosis Date  . OA (osteoarthritis)   . Fibromyalgia   . Anemia   . Sleep apnea   . Fatigue   . Edema   . Esophageal reflux   . Nocturia   . Hypertension   . COPD (chronic obstructive pulmonary disease) ([place])     PAST SURGICAL HISTORY:  Past Surgical History  Procedure Laterality Date  . Cholecystectomy    . Total knee arthroplasty      right  . Vesicovaginal fistula closure w/ tah    . Abdominal hysterectomy      Vital  Signs: BP 102/43 mmHg  Pulse 53  Temp(Src) 98.4 F (36.9 C) (Oral)  Resp 20  Ht 5\' 4"  (1.626 m)  Wt 157.398 kg (347 lb)  BMI 59.53 kg/m2  SpO2 98% Filed Weights   04/12/15 1244 04/12/15 1823 04/12/15 2055  Weight: 156.491 kg (345 lb) 155.13 kg (342 lb) 157.398 kg (347 lb)    Estimated body mass index is 59.53 kg/(m^2) as calculated from the following:   Height as of this encounter: 5\' 4"  (1.626 m).   Weight as of this encounter: 157.398 kg (347 lb).  PHYSICAL EXAM: NAD No wheezing Hrt rrr no mgr Lungs occas cough --no wheezing heard Abd obese soft NT Ext legs in wraps  LABS: CBC:    Component Value Date/Time   WBC 4.5 04/13/2015 0636   WBC 6.1 03/20/2014 1922   HGB 10.6* 04/13/2015 0636   HGB 12.2 03/20/2014 1922   HCT 33.5* 04/13/2015 0636   HCT 39.2 03/20/2014 1922   PLT 228 04/13/2015 0636   PLT 185 03/20/2014 1922   MCV 86.4 04/13/2015 0636   MCV 93 03/20/2014 1922   NEUTROABS 2.3 01/28/2015 0414   NEUTROABS 1.5 08/04/2013 0747   LYMPHSABS 1.4 01/28/2015 0414   LYMPHSABS 1.9 08/04/2013 0747   MONOABS 0.8 01/28/2015 0414   MONOABS  0.6 08/04/2013 0747   EOSABS 0.1 01/28/2015 0414   EOSABS 0.1 08/04/2013 0747   BASOSABS 0.0 01/28/2015 0414   BASOSABS 0.0 08/04/2013 0747   Comprehensive Metabolic Panel:    Component Value Date/Time   NA 141 04/13/2015 0636   NA 142 03/20/2014 1922   K 4.3 04/13/2015 0636   K 4.2 03/20/2014 1922   CL 107 04/13/2015 0636   CL 106 03/20/2014 1922   CO2 28 04/13/2015 0636   CO2 31 03/20/2014 1922   BUN 8 04/13/2015 0636   BUN 13 03/20/2014 1922   CREATININE 0.62 04/13/2015 0636   CREATININE 1.13 03/20/2014 1922   GLUCOSE 131* 04/13/2015 0636   GLUCOSE 127* 03/20/2014 1922   CALCIUM 9.2 04/13/2015 0636   CALCIUM 9.2 03/20/2014 1922   AST 17 01/28/2015 0430   AST 31 03/20/2014 1922   ALT 13* 01/28/2015 0430   ALT 42 03/20/2014 1922   ALKPHOS 77 01/28/2015 0430   ALKPHOS 121* 03/20/2014 1922   BILITOT 0.5  01/28/2015 0430   BILITOT 0.5 03/20/2014 1922   PROT 6.6 01/28/2015 0430   PROT 7.0 03/20/2014 1922   ALBUMIN 3.5 01/28/2015 0430   ALBUMIN 3.0* 03/20/2014 1922     More than 50% of the visit was spent in counseling/coordination of care: YES  Time Spent:  25 min

## 2015-04-15 NOTE — Consult Note (Signed)
WOC wound consult note Reason for Consult: Chronic venous insufficiency.  Weekly Unnas boots.  Discharging today.  Will replace wraps today.  Wound type:Skin intact  Chronic venous insufficiency Pressure Ulcer POA: N/A Measurement:None  Generalized edema from knee to dorsal feet Wound [id number] Drainage (amount, consistency, odor) None Periwound:None Dressing procedure/placement/frequency:Cleanse bilateral legs with soap and water and pat gently dry.  Wrap with zinc layer from just below toes to just below knee.  Secure with self adhering bandage ([place]).  Change weekly.    Will not follow at this time.  Please re-consult if needed.  Domenic Moras RN BSN [place] Pager [phone]

## 2015-04-15 NOTE — Progress Notes (Addendum)
Visit made. Patient in bed, alert and interactive. CSW has looked for a rehab bed at [facility] and [facility] with no beds available, patient also requested for CSW to check [facility], this facility did not take her insurance. Plan is for patient to discharge today to resume [facility] services. Nursing, Pt and aide have been ordered. Updated information faxed to referral. Patient received a 5 day prescription for her MS contin. She has also requested a Chaplain to come and prepare a HCPOA. EMS to transport patient home. Thank you. Flo Shanks RN, BSN, [facility] Liaison [phone] c

## 2015-04-15 NOTE — Progress Notes (Signed)
Palliative Care Update  Pt wants to go to a facility--but there are no beds where she would like to go.  She is going home again with [place] [facility].  She has Rxs for 5 days of her narcotic meds. She will need to go to a Pain Clinic to get these refilled as her primary MD has informed her he will not be filling these Rxs.  She says she would like to go to a facility. I let her know that anytime before the next 30 days --she could be eligible to go to a facility --she would have to make the phone calls herself and that is much harder to arrange from home than from the hospital where social workers facilitate this process.  She says she can hardly move to get dressed to go to a pain clinic, but she is going to have to get some help getting to a pain clinic for a first appointment and follow up appointments.   She says she will 'keep the code status the same'. She is to remain Full Code. We did talk about it.  She would like to establish a HCPOA / Living Will before she goes.  I placed order for chaplain to follow up on this and have paged to see if anyone is available to help with this.    See full note to follow.  Colleen Can, MD

## 2015-04-15 NOTE — Care Management (Addendum)
Discharge to home today with [facility] in place per Dr. Bridgett Larsson.  Nursing, physical therapy, and Nursing assistant will be in the home. Will transport to home per [place] RN MSN [place] Management [phone]

## 2015-04-15 NOTE — Discharge Summary (Signed)
[place] [place] NAME: Loretta Wade    MR#:  [id number]  DATE OF BIRTH:  [birth year]  DATE OF ADMISSION:  04/12/2015 ADMITTING PHYSICIAN: Lytle Butte, MD  DATE OF DISCHARGE: 04/15/2015 PRIMARY CARE PHYSICIAN: Keith Rake, MD    ADMISSION DIAGNOSIS:  Bronchitis [J40] COPD exacerbation ([place]) [J44.1]   DISCHARGE DIAGNOSIS:  Chronic obstructive pulmonary disease exacerbation  SECONDARY DIAGNOSIS:   Past Medical History  Diagnosis Date  . OA (osteoarthritis)   . Fibromyalgia   . Anemia   . Sleep apnea   . Fatigue   . Edema   . Esophageal reflux   . Nocturia   . Hypertension   . COPD (chronic obstructive pulmonary disease) ([place])     HOSPITAL COURSE:  1. Chronic obstructive pulmonary disease exacerbation:  Treated with Solu-Medrol and azithromycin,  changed to prednisone taper, continue DuoNeb treatment. Continue with home medications.  2. Hypokalemia: improved with potassium supplement. 3. GERD without esophagitis PPI therapy 4. Morbid obesity: Nutrition counseling  * Chronic respiratory failure. Continue home oxygen by nasal cannula 2 L. * Abodominal pain and diarrhea. negative stool C diff. Continue home imodium prn. Chronic pain. Continue home pain medication.  DISCHARGE CONDITIONS:   Stable, discharged to home with home health and PT today.  CONSULTS OBTAINED:  Treatment Team:  Lytle Butte, MD  DRUG ALLERGIES:  No Known Allergies  DISCHARGE MEDICATIONS:   Current Discharge Medication List    START taking these medications   Details  predniSONE (DELTASONE) 10 MG tablet 40 mg po daily for 2 days, 20 mg po daily for 2 days, 10 mg po daily for 2 days, Qty: 14 tablet, Refills: 0      CONTINUE these medications which have CHANGED   Details  HYDROcodone-acetaminophen (NORCO/VICODIN) 5-325 MG tablet Take 1 tablet by mouth every 8 (eight) hours as needed for moderate pain. Qty: 15 tablet, Refills: 0     !! morphine (MS CONTIN) 15 MG 12 hr tablet Take 1 tablet (15 mg total) by mouth 2 (two) times daily. Pt takes with a 30mg  tablet. Qty: 10 tablet, Refills: 0    !! morphine (MS CONTIN) 30 MG 12 hr tablet Take 1 tablet (30 mg total) by mouth 2 (two) times daily. Pt takes with a 15mg  tablet. Qty: 10 tablet, Refills: 0     !! - Potential duplicate medications found. Please discuss with provider.    CONTINUE these medications which have NOT CHANGED   Details  acetaminophen (TYLENOL) 325 MG tablet Take 2 tablets (650 mg total) by mouth every 6 (six) hours as needed for mild pain (or Fever >/= 101). Qty: 30 tablet, Refills: 0    albuterol (PROVENTIL HFA;VENTOLIN HFA) 108 (90 Base) MCG/ACT inhaler Inhale 2 puffs into the lungs every 6 (six) hours as needed for wheezing or shortness of breath.    budesonide (PULMICORT) 0.5 MG/2ML nebulizer solution Take 0.5 mg by nebulization 2 (two) times daily.     Chlorphen-Phenyleph-APAP (CORICIDIN D COLD/FLU/SINUS) 2-5-325 MG TABS Take 2 tablets by mouth daily as needed (for nasal congestion/cough).    cholecalciferol (VITAMIN D) 1000 UNITS tablet Take 1,000 Units by mouth daily.     citalopram (CELEXA) 20 MG tablet Take 20 mg by mouth daily.    colesevelam (WELCHOL) 625 MG tablet Take 625 mg by mouth 2 (two) times daily with a meal.    dexlansoprazole (DEXILANT) 60 MG capsule Take 1 capsule (60 mg total) by mouth  daily. Qty: 90 capsule, Refills: 0   Associated Diagnoses: Gastroesophageal reflux disease, esophagitis presence not specified    fluticasone (FLONASE) 50 MCG/ACT nasal spray Place 2 sprays into both nostrils daily as needed for rhinitis.    Fluticasone Furoate-Vilanterol 100-25 MCG/INH AEPB Inhale 1 puff into the lungs 2 (two) times daily.     !! furosemide (LASIX) 40 MG tablet Take 1 tablet (40 mg total) by mouth 2 (two) times daily. Qty: 30 tablet, Refills: 60    guaiFENesin-dextromethorphan (ROBITUSSIN DM) 100-10 MG/5ML syrup Take  5-10 mLs by mouth every 4 (four) hours as needed for cough.     ipratropium-albuterol (DUONEB) 0.5-2.5 (3) MG/3ML SOLN Take 3 mLs by nebulization every 6 (six) hours. Qty: 360 mL, Refills: 0    loperamide (IMODIUM) 2 MG capsule Take 2-4 mg by mouth as needed for diarrhea or loose stools.    Magnesium 250 MG TABS Take 250 mg by mouth daily.    oxybutynin (DITROPAN) 5 MG tablet Take 1 tablet (5 mg total) by mouth daily. Qty: 30 tablet, Refills: 0    pregabalin (LYRICA) 150 MG capsule Take 1 capsule (150 mg total) by mouth 2 (two) times daily. Qty: 60 capsule, Refills: 0   Associated Diagnoses: Fibromyalgia    propranolol ER (INDERAL LA) 80 MG 24 hr capsule Take 80 mg by mouth daily.    ranitidine (ZANTAC) 150 MG tablet Take 150 mg by mouth 3 (three) times daily as needed for heartburn.    rOPINIRole (REQUIP) 0.25 MG tablet Take 3 tablets (0.75 mg total) by mouth at bedtime. Qty: 90 tablet, Refills: 2   Associated Diagnoses: RLS (restless legs syndrome)    tiotropium (SPIRIVA) 18 MCG inhalation capsule Place 18 mcg into inhaler and inhale daily.    albuterol (PROVENTIL) (2.5 MG/3ML) 0.083% nebulizer solution Take 3 mLs (2.5 mg total) by nebulization every 6 (six) hours as needed for wheezing or shortness of breath. Qty: 75 mL, Refills: 12    Diphenhyd-Hydrocort-Nystatin (FIRST-DUKES MOUTHWASH) SUSP Use as directed 1 Squirt in the mouth or throat 2 (two) times daily. Swish and spit Qty: 1 Bottle, Refills: 0    !! furosemide (LASIX) 20 MG tablet Take 1 tablet (20 mg total) by mouth every other day. Qty: 30 tablet, Refills: 0    pantoprazole (PROTONIX) 40 MG tablet Take 1 tablet (40 mg total) by mouth 2 (two) times daily. Qty: 60 tablet, Refills: 0     !! - Potential duplicate medications found. Please discuss with provider.    STOP taking these medications     linezolid (ZYVOX) 600 MG tablet      senna-docusate (SENOKOT-S) 8.6-50 MG per tablet      predniSONE (STERAPRED  UNI-PAK 21 TAB) 10 MG (21) TBPK tablet          DISCHARGE INSTRUCTIONS:    If you experience worsening of your admission symptoms, develop shortness of breath, life threatening emergency, suicidal or homicidal thoughts you must seek medical attention immediately by calling 911 or calling your MD immediately  if symptoms less severe.  You Must read complete instructions/literature along with all the possible adverse reactions/side effects for all the Medicines you take and that have been prescribed to you. Take any new Medicines after you have completely understood and accept all the possible adverse reactions/side effects.   Please note  You were cared for by a hospitalist during your hospital stay. If you have any questions about your discharge medications or the care you received while you  were in the hospital after you are discharged, you can call the unit and asked to speak with the hospitalist on call if the hospitalist that took care of you is not available. Once you are discharged, your primary care physician will handle any further medical issues. Please note that NO REFILLS for any discharge medications will be authorized once you are discharged, as it is imperative that you return to your primary care physician (or establish a relationship with a primary care physician if you do not have one) for your aftercare needs so that they can reassess your need for medications and monitor your lab values.    Today   SUBJECTIVE   Feels better, has mild cough and shortness of breath.   VITAL SIGNS:  Blood pressure 102/43, pulse 53, temperature 98.4 F (36.9 C), temperature source Oral, resp. rate 20, height 5\' 4"  (1.626 m), weight 157.398 kg (347 lb), SpO2 98 %.  I/O:   Intake/Output Summary (Last 24 hours) at 04/15/15 1423 Last data filed at 04/15/15 1100  Gross per 24 hour  Intake      0 ml  Output    550 ml  Net   -550 ml    PHYSICAL EXAMINATION:  GENERAL:  65 y.o.-year-old  patient lying in the bed with no acute distress. Morbidly obese. On oxygen by nasal cannular 2 L. EYES: Pupils equal, round, reactive to light and accommodation. No scleral icterus. Extraocular muscles intact.  HEENT: Head atraumatic, normocephalic. Oropharynx and nasopharynx clear.  NECK:  Supple, no jugular venous distention. No thyroid enlargement, no tenderness.  LUNGS: Normal breath sounds bilaterally, tiny wheezing, no rales,rhonchi or crepitation. No use of accessory muscles of respiration.  CARDIOVASCULAR: S1, S2 normal. No murmurs, rubs, or gallops.  ABDOMEN: Soft, non-tender, non-distended. Bowel sounds present. No organomegaly or mass.  EXTREMITIES: No pedal edema, cyanosis, or clubbing.  NEUROLOGIC: Cranial nerves II through XII are intact. Muscle strength 5/5 in all extremities. Sensation intact. Gait not checked.  PSYCHIATRIC: The patient is alert and oriented x 3.  SKIN: No obvious rash, lesion, or ulcer.   DATA REVIEW:   CBC  Recent Labs Lab 04/13/15 0636  WBC 4.5  HGB 10.6*  HCT 33.5*  PLT 228    Chemistries   Recent Labs Lab 04/12/15 2056 04/13/15 0636  NA  --  141  K  --  4.3  CL  --  107  CO2  --  28  GLUCOSE  --  131*  BUN  --  8  CREATININE  --  0.62  CALCIUM  --  9.2  MG 1.9  --     Cardiac Enzymes  Recent Labs Lab 04/12/15 1502  TROPONINI 0.03    Microbiology Results  Results for orders placed or performed during the hospital encounter of 04/12/15  MRSA PCR Screening     Status: Abnormal   Collection Time: 04/14/15  3:40 PM  Result Value Ref Range Status   MRSA by PCR POSITIVE (A) NEGATIVE Final    Comment: CRITICAL RESULT CALLED TO, READ BACK BY AND VERIFIED WITH: JACKIE PAGE ON 04/14/15 AT 1947 BY TLB        The GeneXpert MRSA Assay (FDA approved for NASAL specimens only), is one component of a comprehensive MRSA colonization surveillance program. It is not intended to diagnose MRSA infection nor to guide or monitor treatment  for MRSA infections.   C difficile quick scan w PCR reflex     Status: None   Collection  Time: 04/14/15  6:50 PM  Result Value Ref Range Status   C Diff antigen NEGATIVE NEGATIVE Final   C Diff toxin NEGATIVE NEGATIVE Final   C Diff interpretation Negative for C. difficile  Final    RADIOLOGY:  No results found.     I discussed with the Dr. Megan Salon and hospice staff Santiago Glad. Management plans discussed with the patient, family and they are in agreement.  CODE STATUS:     Code Status Orders        Start     Ordered   04/12/15 1708  Full code   Continuous     04/12/15 1708    Code Status History    Date Active Date Inactive Code Status Order ID Comments User Context   01/28/2015  8:08 AM 02/08/2015  2:34 PM Full Code [id number]  Loletha Grayer, MD ED   11/13/2014  1:02 PM 11/19/2014  4:39 PM Full Code [id number]  Theodoro Grist, MD Inpatient   11/04/2014  5:50 PM 11/08/2014  4:37 PM Full Code [id number]  Vaughan Basta, MD Inpatient      TOTAL TIME TAKING CARE OF THIS PATIENT: 39 minutes.    Demetrios Loll M.D on 04/15/2015 at 2:23 PM  Between 7am to 6pm - Pager - [phone]  After 6pm go to [email or web address] - password [email or web address] [facility]  [place] [facility]  Office  [phone]  CC: Primary care physician; Keith Rake, MD

## 2015-04-15 NOTE — Discharge Planning (Addendum)
Pt IV and tele removed.  DC papers given, explained and educated.  Pt informed of suggested FU appts (Appt not made since pt prefers to call and set up with her schedule).  Pt given scripts.  RN assessment and VSS and shows stability for DC to home.  Pt also speaking with clergy before DC to discuss healthcare POA.  Once complete, EMS will be contacted to transport home.

## 2015-04-20 ENCOUNTER — Telehealth: Payer: Self-pay | Admitting: Family Medicine

## 2015-04-20 DIAGNOSIS — M797 Fibromyalgia: Secondary | ICD-10-CM

## 2015-04-20 NOTE — Telephone Encounter (Signed)
Patient is requesting a referral to be sent to the pain clinic and would like her appointments to be on Mondays

## 2015-04-22 NOTE — Telephone Encounter (Signed)
Routed to Dr. Shah for referral 

## 2015-04-22 NOTE — Telephone Encounter (Signed)
errenous °

## 2015-04-22 NOTE — Telephone Encounter (Signed)
PT HAS CALLED AGAIN ABOUT THIS. PLEASE ADVISE

## 2015-04-23 ENCOUNTER — Telehealth: Payer: Self-pay

## 2015-04-29 NOTE — Telephone Encounter (Signed)
error 

## 2015-05-03 ENCOUNTER — Encounter: Payer: Self-pay | Admitting: Pulmonary Disease

## 2015-05-03 ENCOUNTER — Telehealth: Payer: Self-pay | Admitting: Family Medicine

## 2015-05-03 ENCOUNTER — Encounter: Payer: Self-pay | Admitting: Family Medicine

## 2015-05-03 ENCOUNTER — Ambulatory Visit (INDEPENDENT_AMBULATORY_CARE_PROVIDER_SITE_OTHER): Payer: Medicare PPO | Admitting: Family Medicine

## 2015-05-03 VITALS — BP 124/76 | HR 79 | Temp 98.3°F | Resp 18 | Ht 64.0 in | Wt 325.6 lb

## 2015-05-03 DIAGNOSIS — J441 Chronic obstructive pulmonary disease with (acute) exacerbation: Secondary | ICD-10-CM

## 2015-05-03 DIAGNOSIS — M797 Fibromyalgia: Secondary | ICD-10-CM

## 2015-05-03 DIAGNOSIS — J449 Chronic obstructive pulmonary disease, unspecified: Secondary | ICD-10-CM

## 2015-05-03 MED ORDER — BENZONATATE 200 MG PO CAPS: 200.0000 mg | ORAL_CAPSULE | Freq: Three times a day (TID) | ORAL | Status: DC | PRN

## 2015-05-03 MED ORDER — MOXIFLOXACIN HCL 400 MG PO TABS: 400.0000 mg | ORAL_TABLET | Freq: Every day | ORAL | Status: DC

## 2015-05-03 MED ORDER — PREDNISONE 10 MG PO TABS: 10.0000 mg | ORAL_TABLET | Freq: Every day | ORAL | Status: DC

## 2015-05-03 NOTE — Telephone Encounter (Signed)
Is being referred to lung specialist but would like the appointment to be on an Monday and is requesting that it be scheduled after next week.

## 2015-05-03 NOTE — Progress Notes (Signed)
Name: Loretta Wade   MRN: [id number]    DOB: [birth year]   Date:05/03/2015       Progress Note  Subjective  Chief Complaint  Chief Complaint  Patient presents with  . Hospitalization Follow-up    COPD,chest pain  . Cough    4 days    HPI  COPD Exacerbation Pt. Is here for COPD Exacerbation, hospital follow up. She was admitted with shortness of breath, diagnosed with COPD Exacerbation. Hospital course reviewed, which included CXR, ABx therapy, steroids, neb treatments. She reports persistent shortness of breath and cough (alongwith greenish mucus), which is not resolving since her discharge from the hospital. She has taken Robitussin DM, which has not helped. She was started on Zithromax in the hospital but was not sent home on any ABxs.   Past Medical History  Diagnosis Date  . OA (osteoarthritis)   . Fibromyalgia   . Anemia   . Sleep apnea   . Fatigue   . Edema   . Esophageal reflux   . Nocturia   . Hypertension   . COPD (chronic obstructive pulmonary disease) [facility])     Past Surgical History  Procedure Laterality Date  . Cholecystectomy    . Total knee arthroplasty      right  . Vesicovaginal fistula closure w/ tah    . Abdominal hysterectomy      Family History  Problem Relation Age of Onset  . Hypertension Mother   . Diabetes    . Breast cancer Daughter     Social History   Social History  . Marital Status: Single    Spouse Name: N/A  . Number of Children: N/A  . Years of Education: N/A   Occupational History  . Not on file.   Social History Main Topics  . Smoking status: Former Smoker    Quit date: 11/04/1983  . Smokeless tobacco: Not on file  . Alcohol Use: No  . Drug Use: No  . Sexual Activity: Not on file   Other Topics Concern  . Not on file   Social History Narrative     Current outpatient prescriptions:  .  acetaminophen (TYLENOL) 325 MG tablet, Take 2 tablets (650 mg total) by mouth every 6 (six) hours as needed for mild  pain (or Fever >/= 101)., Disp: 30 tablet, Rfl: 0 .  albuterol (PROVENTIL HFA;VENTOLIN HFA) 108 (90 Base) MCG/ACT inhaler, Inhale 2 puffs into the lungs every 6 (six) hours as needed for wheezing or shortness of breath., Disp: , Rfl:  .  albuterol (PROVENTIL) (2.5 MG/3ML) 0.083% nebulizer solution, Take 3 mLs (2.5 mg total) by nebulization every 6 (six) hours as needed for wheezing or shortness of breath. (Patient not taking: Reported on 04/12/2015), Disp: 75 mL, Rfl: 12 .  budesonide (PULMICORT) 0.5 MG/2ML nebulizer solution, Take 0.5 mg by nebulization 2 (two) times daily. , Disp: , Rfl:  .  Chlorphen-Phenyleph-APAP (CORICIDIN D COLD/FLU/SINUS) 2-5-325 MG TABS, Take 2 tablets by mouth daily as needed (for nasal congestion/cough)., Disp: , Rfl:  .  cholecalciferol (VITAMIN D) 1000 UNITS tablet, Take 1,000 Units by mouth daily. , Disp: , Rfl:  .  citalopram (CELEXA) 20 MG tablet, Take 20 mg by mouth daily., Disp: , Rfl:  .  colesevelam (WELCHOL) 625 MG tablet, Take 625 mg by mouth 2 (two) times daily with a meal., Disp: , Rfl:  .  dexlansoprazole (DEXILANT) 60 MG capsule, Take 1 capsule (60 mg total) by mouth daily., Disp: 90 capsule, Rfl: 0 .  Diphenhyd-Hydrocort-Nystatin (FIRST-DUKES MOUTHWASH) SUSP, Use as directed 1 Squirt in the mouth or throat 2 (two) times daily. Swish and spit (Patient not taking: Reported on 04/12/2015), Disp: 1 Bottle, Rfl: 0 .  fluticasone (FLONASE) 50 MCG/ACT nasal spray, Place 2 sprays into both nostrils daily as needed for rhinitis., Disp: , Rfl:  .  Fluticasone Furoate-Vilanterol 100-25 MCG/INH AEPB, Inhale 1 puff into the lungs 2 (two) times daily. , Disp: , Rfl:  .  furosemide (LASIX) 20 MG tablet, Take 1 tablet (20 mg total) by mouth every other day. (Patient not taking: Reported on 04/12/2015), Disp: 30 tablet, Rfl: 0 .  furosemide (LASIX) 40 MG tablet, Take 1 tablet (40 mg total) by mouth 2 (two) times daily., Disp: 30 tablet, Rfl: 60 .  guaiFENesin-dextromethorphan  (ROBITUSSIN DM) 100-10 MG/5ML syrup, Take 5-10 mLs by mouth every 4 (four) hours as needed for cough. , Disp: , Rfl:  .  HYDROcodone-acetaminophen (NORCO/VICODIN) 5-325 MG tablet, Take 1 tablet by mouth every 8 (eight) hours as needed for moderate pain., Disp: 15 tablet, Rfl: 0 .  ipratropium-albuterol (DUONEB) 0.5-2.5 (3) MG/3ML SOLN, Take 3 mLs by nebulization every 6 (six) hours. (Patient taking differently: Take 3 mLs by nebulization every 6 (six) hours as needed (for wheezing/shortness of breath). ), Disp: 360 mL, Rfl: 0 .  loperamide (IMODIUM) 2 MG capsule, Take 2-4 mg by mouth as needed for diarrhea or loose stools., Disp: , Rfl:  .  Magnesium 250 MG TABS, Take 250 mg by mouth daily., Disp: , Rfl:  .  morphine (MS CONTIN) 15 MG 12 hr tablet, Take 1 tablet (15 mg total) by mouth 2 (two) times daily. Pt takes with a 30mg  tablet., Disp: 10 tablet, Rfl: 0 .  morphine (MS CONTIN) 30 MG 12 hr tablet, Take 1 tablet (30 mg total) by mouth 2 (two) times daily. Pt takes with a 15mg  tablet., Disp: 10 tablet, Rfl: 0 .  nystatin (MYCOSTATIN) 100000 UNIT/ML suspension, , Disp: , Rfl:  .  oxybutynin (DITROPAN) 5 MG tablet, Take 1 tablet (5 mg total) by mouth daily., Disp: 30 tablet, Rfl: 0 .  pantoprazole (PROTONIX) 40 MG tablet, Take 1 tablet (40 mg total) by mouth 2 (two) times daily. (Patient not taking: Reported on 04/12/2015), Disp: 60 tablet, Rfl: 0 .  predniSONE (DELTASONE) 10 MG tablet, 40 mg po daily for 2 days, 20 mg po daily for 2 days, 10 mg po daily for 2 days,, Disp: 14 tablet, Rfl: 0 .  pregabalin (LYRICA) 150 MG capsule, Take 1 capsule (150 mg total) by mouth 2 (two) times daily., Disp: 60 capsule, Rfl: 0 .  propranolol ER (INDERAL LA) 80 MG 24 hr capsule, Take 80 mg by mouth daily., Disp: , Rfl:  .  ranitidine (ZANTAC) 150 MG tablet, Take 150 mg by mouth 3 (three) times daily as needed for heartburn., Disp: , Rfl:  .  rOPINIRole (REQUIP) 0.25 MG tablet, Take 3 tablets (0.75 mg total) by mouth at  bedtime., Disp: 90 tablet, Rfl: 2 .  tiotropium (SPIRIVA) 18 MCG inhalation capsule, Place 18 mcg into inhaler and inhale daily., Disp: , Rfl:   No Known Allergies   Review of Systems  Constitutional: Positive for chills. Negative for fever.  Respiratory: Positive for cough, sputum production, shortness of breath and wheezing.   Cardiovascular: Negative for chest pain.    Objective  Filed Vitals:   05/03/15 1135  BP: 124/76  Pulse: 79  Temp: 98.3 F (36.8 C)  Resp: 18  Height: 5\' 4"  (1.626 m)  Weight: 325 lb 9 oz (147.674 kg)  SpO2: 94%    Physical Exam  Constitutional: She is well-developed, well-nourished, and in no distress.  Cardiovascular: Normal rate.   Pulmonary/Chest: She has decreased breath sounds. She has wheezes. She has rhonchi.  Expiratory wheezing on right side, crackles on the left side, poor air entry, no use of Accessory muscles of respiration during the exam.  Nursing note and vitals reviewed.    Assessment & Plan  1. COPD, frequent exacerbations ([place]) Persistent wheezing and coughing, above baseline. We will start on fluoroquinolone, second prednisone taper, and antitussive. Obtained CXR to rule out pneumonia and referred to pulmonology. Advised to continue taking all her medications as prescribed. - predniSONE (DELTASONE) 10 MG tablet; Take 1 tablet (10 mg total) by mouth daily with breakfast.  Dispense: 21 tablet; Refill: 0 - moxifloxacin (AVELOX) 400 MG tablet; Take 1 tablet (400 mg total) by mouth daily at 8 pm.  Dispense: 7 tablet; Refill: 0 - benzonatate (TESSALON) 200 MG capsule; Take 1 capsule (200 mg total) by mouth 3 (three) times daily as needed for cough.  Dispense: 20 capsule; Refill: 0 - DG Chest 2 View; Future - Ambulatory referral to Pulmonology  2. Fibromyalgia Currently on hydrocodone-acetaminophen 5-325 mg every 8 hours as needed, which is not helping relieve her pain. Was provided with prescriptions for morphine from the hospital  upon discharge. She has been set up with pain management, and has an upcoming appointment next month. We will consider increasing the dose of hydrocodone if she runs out prior to her establishing care with pain clinic   [facility] 05/03/2015 12:00 PM

## 2015-05-04 ENCOUNTER — Telehealth: Payer: Self-pay | Admitting: Family Medicine

## 2015-05-04 NOTE — Telephone Encounter (Signed)
Spoke with Ivin Booty from [place] and notified her that patient has an appointment on 05/10/2015

## 2015-05-04 NOTE — Telephone Encounter (Signed)
[facility]: received fax but is requesting a return call [phone]

## 2015-05-06 ENCOUNTER — Other Ambulatory Visit: Payer: Self-pay

## 2015-05-06 NOTE — Telephone Encounter (Signed)
Routed to [name] for refill approval

## 2015-05-07 ENCOUNTER — Telehealth: Payer: Self-pay | Admitting: Family Medicine

## 2015-05-07 NOTE — Telephone Encounter (Signed)
Patient states that she was just here on Mon. 05/03/15 and needs the nystatin 1000 unit mouth wash and that she cannot wait until Monday.

## 2015-05-07 NOTE — Telephone Encounter (Signed)
Please disregard encounter.  Patient no longer requesting nystatin

## 2015-05-10 ENCOUNTER — Ambulatory Visit: Payer: Medicare PPO | Admitting: Family Medicine

## 2015-05-11 ENCOUNTER — Telehealth: Payer: Self-pay | Admitting: Family Medicine

## 2015-05-11 DIAGNOSIS — M797 Fibromyalgia: Secondary | ICD-10-CM

## 2015-05-11 MED ORDER — PREGABALIN 150 MG PO CAPS: 150.0000 mg | ORAL_CAPSULE | Freq: Two times a day (BID) | ORAL | Status: DC

## 2015-05-11 NOTE — Telephone Encounter (Signed)
Medication has been refilled and called into [facility]

## 2015-05-11 NOTE — Telephone Encounter (Signed)
Requesting return call she is needing a refill on medications

## 2015-05-12 ENCOUNTER — Emergency Department: Payer: Medicare PPO

## 2015-05-12 ENCOUNTER — Other Ambulatory Visit: Payer: Self-pay

## 2015-05-12 ENCOUNTER — Inpatient Hospital Stay
Admission: EM | Admit: 2015-05-12 | Discharge: 2015-05-16 | DRG: 191 | Disposition: A | Discharge status: Home Health | Payer: Medicare PPO | Attending: Internal Medicine | Admitting: Internal Medicine

## 2015-05-12 ENCOUNTER — Encounter: Payer: Self-pay | Admitting: Emergency Medicine

## 2015-05-12 DIAGNOSIS — Z87891 Personal history of nicotine dependence: Secondary | ICD-10-CM | POA: Diagnosis not present

## 2015-05-12 DIAGNOSIS — M797 Fibromyalgia: Secondary | ICD-10-CM | POA: Diagnosis present

## 2015-05-12 DIAGNOSIS — Z9981 Dependence on supplemental oxygen: Secondary | ICD-10-CM | POA: Diagnosis not present

## 2015-05-12 DIAGNOSIS — K219 Gastro-esophageal reflux disease without esophagitis: Secondary | ICD-10-CM

## 2015-05-12 DIAGNOSIS — I10 Essential (primary) hypertension: Secondary | ICD-10-CM | POA: Diagnosis present

## 2015-05-12 DIAGNOSIS — J9610 Chronic respiratory failure, unspecified whether with hypoxia or hypercapnia: Secondary | ICD-10-CM | POA: Diagnosis present

## 2015-05-12 DIAGNOSIS — R0600 Dyspnea, unspecified: Secondary | ICD-10-CM

## 2015-05-12 DIAGNOSIS — Z803 Family history of malignant neoplasm of breast: Secondary | ICD-10-CM

## 2015-05-12 DIAGNOSIS — Z8249 Family history of ischemic heart disease and other diseases of the circulatory system: Secondary | ICD-10-CM | POA: Diagnosis not present

## 2015-05-12 DIAGNOSIS — G4733 Obstructive sleep apnea (adult) (pediatric): Secondary | ICD-10-CM | POA: Diagnosis present

## 2015-05-12 DIAGNOSIS — Z6841 Body Mass Index (BMI) 40.0 and over, adult: Secondary | ICD-10-CM | POA: Diagnosis not present

## 2015-05-12 DIAGNOSIS — J441 Chronic obstructive pulmonary disease with (acute) exacerbation: Principal | ICD-10-CM | POA: Diagnosis present

## 2015-05-12 DIAGNOSIS — Z96651 Presence of right artificial knee joint: Secondary | ICD-10-CM | POA: Diagnosis present

## 2015-05-12 DIAGNOSIS — Z833 Family history of diabetes mellitus: Secondary | ICD-10-CM

## 2015-05-12 DIAGNOSIS — Z7951 Long term (current) use of inhaled steroids: Secondary | ICD-10-CM | POA: Diagnosis not present

## 2015-05-12 DIAGNOSIS — Z9049 Acquired absence of other specified parts of digestive tract: Secondary | ICD-10-CM

## 2015-05-12 DIAGNOSIS — K210 Gastro-esophageal reflux disease with esophagitis: Secondary | ICD-10-CM | POA: Diagnosis present

## 2015-05-12 DIAGNOSIS — R51 Headache: Secondary | ICD-10-CM

## 2015-05-12 DIAGNOSIS — Z90710 Acquired absence of both cervix and uterus: Secondary | ICD-10-CM | POA: Diagnosis not present

## 2015-05-12 DIAGNOSIS — Z9889 Other specified postprocedural states: Secondary | ICD-10-CM | POA: Diagnosis not present

## 2015-05-12 DIAGNOSIS — Z79899 Other long term (current) drug therapy: Secondary | ICD-10-CM | POA: Diagnosis not present

## 2015-05-12 DIAGNOSIS — M1990 Unspecified osteoarthritis, unspecified site: Secondary | ICD-10-CM | POA: Diagnosis present

## 2015-05-12 DIAGNOSIS — R519 Headache, unspecified: Secondary | ICD-10-CM

## 2015-05-12 LAB — BASIC METABOLIC PANEL
Anion gap: 9 (ref 5–15)
BUN: 8 mg/dL (ref 6–20)
CHLORIDE: 106 mmol/L (ref 101–111)
CO2: 25 mmol/L (ref 22–32)
CREATININE: 0.64 mg/dL (ref 0.44–1.00)
Calcium: 9.5 mg/dL (ref 8.9–10.3)
GFR calc Af Amer: >60 mL/min (ref >60)
GFR calc non Af Amer: >60 mL/min (ref >60)
GLUCOSE: 114 mg/dL — AB (ref 65–99)
Potassium: 4 mmol/L (ref 3.5–5.1)
Sodium: 140 mmol/L (ref 135–145)

## 2015-05-12 LAB — CBC
HCT: 39.9 % (ref 35.0–47.0)
Hemoglobin: 12.6 g/dL (ref 12.0–16.0)
MCH: 29 pg (ref 26.0–34.0)
MCHC: 31.7 g/dL — ABNORMAL LOW (ref 32.0–36.0)
MCV: 91.5 fL (ref 80.0–100.0)
PLATELETS: 217 10*3/uL (ref 150–440)
RBC: 4.35 MIL/uL (ref 3.80–5.20)
RDW: 18.9 % — AB (ref 11.5–14.5)
WBC: 7 10*3/uL (ref 3.6–11.0)

## 2015-05-12 LAB — RAPID INFLUENZA A&B ANTIGENS
Influenza A (ARMC): NEGATIVE
Influenza B (ARMC): NEGATIVE

## 2015-05-12 LAB — MAGNESIUM: Magnesium: 2.1 mg/dL (ref 1.7–2.4)

## 2015-05-12 LAB — TROPONIN I: Troponin I: <0.03 ng/mL (ref <0.031)

## 2015-05-12 MED ORDER — LOPERAMIDE HCL 2 MG PO CAPS: 2.0000 mg | ORAL_CAPSULE | ORAL | Status: DC | PRN

## 2015-05-12 MED ORDER — PANTOPRAZOLE SODIUM 40 MG PO TBEC
40.0000 mg | DELAYED_RELEASE_TABLET | Freq: Every day | ORAL | Status: DC
  Administered 2015-05-12 – 2015-05-15 (×4): 40 mg via ORAL
  Filled 2015-05-12 (×4): qty 1

## 2015-05-12 MED ORDER — PANTOPRAZOLE SODIUM 40 MG PO TBEC: 40.0000 mg | DELAYED_RELEASE_TABLET | Freq: Every day | ORAL | Status: DC

## 2015-05-12 MED ORDER — ACETAMINOPHEN 650 MG RE SUPP: 650.0000 mg | Freq: Four times a day (QID) | RECTAL | Status: DC | PRN

## 2015-05-12 MED ORDER — COLESEVELAM HCL 625 MG PO TABS
625.0000 mg | ORAL_TABLET | Freq: Two times a day (BID) | ORAL | Status: DC
  Administered 2015-05-13 – 2015-05-16 (×7): 625 mg via ORAL
  Filled 2015-05-12 (×9): qty 1

## 2015-05-12 MED ORDER — GUAIFENESIN 100 MG/5ML PO SOLN
5.0000 mL | ORAL | Status: DC | PRN
  Administered 2015-05-13: 100 mg via ORAL
  Filled 2015-05-12 (×3): qty 10

## 2015-05-12 MED ORDER — SODIUM CHLORIDE 0.9% FLUSH
3.0000 mL | Freq: Two times a day (BID) | INTRAVENOUS | Status: DC
  Administered 2015-05-12 – 2015-05-16 (×8): 3 mL via INTRAVENOUS

## 2015-05-12 MED ORDER — KETOROLAC TROMETHAMINE 30 MG/ML IJ SOLN
30.0000 mg | Freq: Once | INTRAMUSCULAR | Status: AC
  Administered 2015-05-12: 30 mg via INTRAVENOUS
  Filled 2015-05-12: qty 1

## 2015-05-12 MED ORDER — ALBUTEROL SULFATE (2.5 MG/3ML) 0.083% IN NEBU
5.0000 mg | INHALATION_SOLUTION | Freq: Once | RESPIRATORY_TRACT | Status: AC
  Administered 2015-05-12: 5 mg via RESPIRATORY_TRACT
  Filled 2015-05-12: qty 6

## 2015-05-12 MED ORDER — METHYLPREDNISOLONE SODIUM SUCC 125 MG IJ SOLR
60.0000 mg | Freq: Two times a day (BID) | INTRAMUSCULAR | Status: DC
  Administered 2015-05-12: 125 mg via INTRAVENOUS
  Administered 2015-05-13 – 2015-05-16 (×7): 60 mg via INTRAVENOUS
  Filled 2015-05-12 (×8): qty 2

## 2015-05-12 MED ORDER — FUROSEMIDE 40 MG PO TABS
40.0000 mg | ORAL_TABLET | Freq: Two times a day (BID) | ORAL | Status: DC
  Administered 2015-05-13 – 2015-05-15 (×5): 40 mg via ORAL
  Filled 2015-05-12 (×7): qty 1

## 2015-05-12 MED ORDER — PROPRANOLOL HCL ER 80 MG PO CP24
80.0000 mg | ORAL_CAPSULE | Freq: Every day | ORAL | Status: DC
  Administered 2015-05-13 – 2015-05-16 (×4): 80 mg via ORAL
  Filled 2015-05-12 (×5): qty 1

## 2015-05-12 MED ORDER — GUAIFENESIN 100 MG/5ML PO SOLN
5.0000 mL | ORAL | Status: DC | PRN
  Filled 2015-05-12: qty 5

## 2015-05-12 MED ORDER — BUDESONIDE 0.5 MG/2ML IN SUSP
0.5000 mg | Freq: Two times a day (BID) | RESPIRATORY_TRACT | Status: DC
  Administered 2015-05-12 – 2015-05-13 (×3): 0.5 mg via RESPIRATORY_TRACT
  Filled 2015-05-12 (×3): qty 2

## 2015-05-12 MED ORDER — ALBUTEROL SULFATE (2.5 MG/3ML) 0.083% IN NEBU
2.5000 mg | INHALATION_SOLUTION | RESPIRATORY_TRACT | Status: DC | PRN
  Administered 2015-05-13 – 2015-05-16 (×2): 2.5 mg via RESPIRATORY_TRACT
  Filled 2015-05-12 (×3): qty 3

## 2015-05-12 MED ORDER — TIOTROPIUM BROMIDE MONOHYDRATE 18 MCG IN CAPS
18.0000 ug | ORAL_CAPSULE | Freq: Every day | RESPIRATORY_TRACT | Status: DC
  Administered 2015-05-13 – 2015-05-16 (×4): 18 ug via RESPIRATORY_TRACT
  Filled 2015-05-12: qty 5

## 2015-05-12 MED ORDER — HYDROCODONE-ACETAMINOPHEN 5-325 MG PO TABS
1.0000 | ORAL_TABLET | Freq: Three times a day (TID) | ORAL | Status: DC | PRN
  Administered 2015-05-12 – 2015-05-16 (×8): 1 via ORAL
  Filled 2015-05-12 (×9): qty 1

## 2015-05-12 MED ORDER — HEPARIN SODIUM (PORCINE) 5000 UNIT/ML IJ SOLN
5000.0000 [IU] | Freq: Three times a day (TID) | INTRAMUSCULAR | Status: DC
  Administered 2015-05-12 – 2015-05-16 (×11): 5000 [IU] via SUBCUTANEOUS
  Filled 2015-05-12 (×12): qty 1

## 2015-05-12 MED ORDER — BENZONATATE 100 MG PO CAPS
200.0000 mg | ORAL_CAPSULE | Freq: Three times a day (TID) | ORAL | Status: DC | PRN
  Administered 2015-05-15 – 2015-05-16 (×2): 200 mg via ORAL
  Filled 2015-05-12 (×2): qty 2

## 2015-05-12 MED ORDER — ONDANSETRON HCL 4 MG/2ML IJ SOLN: 4.0000 mg | Freq: Four times a day (QID) | INTRAMUSCULAR | Status: DC | PRN

## 2015-05-12 MED ORDER — ACETAMINOPHEN 325 MG PO TABS
650.0000 mg | ORAL_TABLET | Freq: Four times a day (QID) | ORAL | Status: DC | PRN
  Administered 2015-05-13: 650 mg via ORAL
  Filled 2015-05-12: qty 2

## 2015-05-12 MED ORDER — CITALOPRAM HYDROBROMIDE 20 MG PO TABS
20.0000 mg | ORAL_TABLET | Freq: Every day | ORAL | Status: DC
  Administered 2015-05-13 – 2015-05-16 (×4): 20 mg via ORAL
  Filled 2015-05-12 (×4): qty 1

## 2015-05-12 MED ORDER — FLUTICASONE PROPIONATE 50 MCG/ACT NA SUSP
2.0000 | Freq: Every day | NASAL | Status: DC | PRN
  Administered 2015-05-13 – 2015-05-15 (×2): 2 via NASAL
  Filled 2015-05-12: qty 16

## 2015-05-12 MED ORDER — PREGABALIN 75 MG PO CAPS
150.0000 mg | ORAL_CAPSULE | Freq: Two times a day (BID) | ORAL | Status: DC
  Administered 2015-05-12 – 2015-05-16 (×8): 150 mg via ORAL
  Filled 2015-05-12 (×9): qty 2

## 2015-05-12 MED ORDER — NYSTATIN 100000 UNIT/ML MT SUSP
5.0000 mL | Freq: Four times a day (QID) | OROMUCOSAL | Status: DC | PRN
  Administered 2015-05-16 (×2): 500000 [IU] via ORAL
  Filled 2015-05-12 (×2): qty 5

## 2015-05-12 MED ORDER — ACETAMINOPHEN 325 MG PO TABS: 650.0000 mg | ORAL_TABLET | Freq: Four times a day (QID) | ORAL | Status: DC | PRN

## 2015-05-12 MED ORDER — PREDNISONE 20 MG PO TABS: 40.0000 mg | ORAL_TABLET | Freq: Every day | ORAL | Status: DC

## 2015-05-12 MED ORDER — FLUTICASONE FUROATE-VILANTEROL 100-25 MCG/INH IN AEPB
1.0000 | INHALATION_SPRAY | Freq: Two times a day (BID) | RESPIRATORY_TRACT | Status: DC
  Administered 2015-05-12 – 2015-05-16 (×8): 1 via RESPIRATORY_TRACT
  Filled 2015-05-12: qty 28

## 2015-05-12 MED ORDER — SODIUM CHLORIDE 0.9 % IV SOLN: 250.0000 mL | INTRAVENOUS | Status: DC | PRN

## 2015-05-12 MED ORDER — SODIUM CHLORIDE 0.9% FLUSH
3.0000 mL | INTRAVENOUS | Status: DC | PRN
Start: 2015-05-12 — End: 2015-05-16

## 2015-05-12 MED ORDER — PREDNISONE 20 MG PO TABS
60.0000 mg | ORAL_TABLET | ORAL | Status: AC
  Administered 2015-05-12: 60 mg via ORAL
  Filled 2015-05-12: qty 3

## 2015-05-12 MED ORDER — AZITHROMYCIN 250 MG PO TABS: ORAL_TABLET | ORAL | Status: DC

## 2015-05-12 MED ORDER — ONDANSETRON HCL 4 MG PO TABS: 4.0000 mg | ORAL_TABLET | Freq: Four times a day (QID) | ORAL | Status: DC | PRN

## 2015-05-12 MED ORDER — IPRATROPIUM-ALBUTEROL 0.5-2.5 (3) MG/3ML IN SOLN
9.0000 mL | Freq: Once | RESPIRATORY_TRACT | Status: AC
  Administered 2015-05-12: 9 mL via RESPIRATORY_TRACT
  Filled 2015-05-12: qty 9

## 2015-05-12 MED ORDER — HYDROCODONE-ACETAMINOPHEN 5-325 MG PO TABS: 1.0000 | ORAL_TABLET | Freq: Three times a day (TID) | ORAL | Status: DC | PRN

## 2015-05-12 MED ORDER — CHLORPHEN-PE-ACETAMINOPHEN 2-5-325 MG PO TABS: 2.0000 | ORAL_TABLET | Freq: Every day | ORAL | Status: DC | PRN

## 2015-05-12 MED ORDER — MAGNESIUM OXIDE 400 (241.3 MG) MG PO TABS
400.0000 mg | ORAL_TABLET | Freq: Every day | ORAL | Status: DC
  Administered 2015-05-13 – 2015-05-16 (×4): 400 mg via ORAL
  Filled 2015-05-12 (×4): qty 1

## 2015-05-12 MED ORDER — ROPINIROLE HCL 0.25 MG PO TABS
0.7500 mg | ORAL_TABLET | Freq: Every day | ORAL | Status: DC
  Administered 2015-05-12 – 2015-05-15 (×4): 0.75 mg via ORAL
  Filled 2015-05-12 (×4): qty 3

## 2015-05-12 MED ORDER — OXYBUTYNIN CHLORIDE 5 MG PO TABS
5.0000 mg | ORAL_TABLET | Freq: Every day | ORAL | Status: DC
  Administered 2015-05-13 – 2015-05-16 (×4): 5 mg via ORAL
  Filled 2015-05-12 (×4): qty 1

## 2015-05-12 MED ORDER — ALBUTEROL SULFATE HFA 108 (90 BASE) MCG/ACT IN AERS: 2.0000 | INHALATION_SPRAY | Freq: Four times a day (QID) | RESPIRATORY_TRACT | Status: DC | PRN

## 2015-05-12 NOTE — H&P (Signed)
[place] [facility] [place] NAME: Loretta Wade    MR#:  [id number]  DATE OF BIRTH:  May 05, 1950  DATE OF ADMISSION:  05/12/2015  PRIMARY CARE PHYSICIAN: Keith Rake, MD   REQUESTING/REFERRING PHYSICIAN: Carrie Mew, MD  CHIEF COMPLAINT:   Chief Complaint  Patient presents with  . Chest Pain    HISTORY OF PRESENT ILLNESS:  Loretta Wade  is a 65 y.o. female with a known history of COPD, sleep apnea, hypertension. Patient complains of cough for several weeks. She has had a chest tightness, cough with sputum, wheezing and shortness of breath for the past few days. She has chronic respiratory failure, on home oxygen 2 L by nasal cannula. She denies any fever or chills, no orthopnea, nocturnal dyspnea or leg edema. Chest x-ray didn't show any infiltrate. She was treated with nebulizer ED, but is still has wheezing and shortness of breath. This is 4th admission in the past the 6 months.  PAST MEDICAL HISTORY:   Past Medical History  Diagnosis Date  . OA (osteoarthritis)   . Fibromyalgia   . Anemia   . Sleep apnea   . Fatigue   . Edema   . Esophageal reflux   . Nocturia   . Hypertension   . COPD (chronic obstructive pulmonary disease) ([place])     PAST SURGICAL HISTORY:   Past Surgical History  Procedure Laterality Date  . Cholecystectomy    . Total knee arthroplasty      right  . Vesicovaginal fistula closure w/ tah    . Abdominal hysterectomy      SOCIAL HISTORY:   Social History  Substance Use Topics  . Smoking status: Former Smoker    Quit date: 11/04/1983  . Smokeless tobacco: Not on file  . Alcohol Use: No    FAMILY HISTORY:   Family History  Problem Relation Age of Onset  . Hypertension Mother   . Diabetes    . Breast cancer Daughter     DRUG ALLERGIES:  No Known Allergies  REVIEW OF SYSTEMS:  CONSTITUTIONAL: No fever, but has generalized weakness.  EYES: No blurred or double vision.  EARS, NOSE,  AND THROAT: No tinnitus or ear pain.  RESPIRATORY: Has cough, shortness of breath, wheezing but no hemoptysis.  CARDIOVASCULAR: No chest pain, orthopnea, edema.  GASTROINTESTINAL: Has nausea, no vomiting, diarrhea or abdominal pain.  GENITOURINARY: No dysuria, hematuria.  ENDOCRINE: No polyuria, nocturia,  HEMATOLOGY: No anemia, easy bruising or bleeding SKIN: No rash or lesion. MUSCULOSKELETAL: No joint pain or arthritis.   NEUROLOGIC: No tingling, numbness, weakness.  PSYCHIATRY: No anxiety or depression.   MEDICATIONS AT HOME:   Prior to Admission medications   Medication Sig Start Date End Date Taking? Authorizing Provider  acetaminophen (TYLENOL) 325 MG tablet Take 2 tablets (650 mg total) by mouth every 6 (six) hours as needed for mild pain (or Fever >/= 101). 11/17/14   Gladstone Lighter, MD  albuterol (PROVENTIL HFA;VENTOLIN HFA) 108 (90 Base) MCG/ACT inhaler Inhale 2 puffs into the lungs every 6 (six) hours as needed for wheezing or shortness of breath.    Historical Provider, MD  albuterol (PROVENTIL) (2.5 MG/3ML) 0.083% nebulizer solution Take 3 mLs (2.5 mg total) by nebulization every 6 (six) hours as needed for wheezing or shortness of breath. Patient not taking: Reported on 04/12/2015 11/19/14   Gladstone Lighter, MD  azithromycin (ZITHROMAX Z-PAK) 250 MG tablet Take 2 tablets (500 mg) on  Day 1,  followed  by 1 tablet (250 mg) once daily on Days 2 through 5. 05/12/15   Carrie Mew, MD  benzonatate (TESSALON) 200 MG capsule Take 1 capsule (200 mg total) by mouth 3 (three) times daily as needed for cough. 05/03/15   Roselee Nova, MD  budesonide (PULMICORT) 0.5 MG/2ML nebulizer solution Take 0.5 mg by nebulization 2 (two) times daily.     Historical Provider, MD  Chlorphen-Phenyleph-APAP (CORICIDIN D COLD/FLU/SINUS) 2-5-325 MG TABS Take 2 tablets by mouth daily as needed (for nasal congestion/cough).    Historical Provider, MD  cholecalciferol (VITAMIN D) 1000 UNITS tablet Take  1,000 Units by mouth daily.     Historical Provider, MD  citalopram (CELEXA) 20 MG tablet Take 20 mg by mouth daily.    Historical Provider, MD  colesevelam (WELCHOL) 625 MG tablet Take 625 mg by mouth 2 (two) times daily with a meal.    Historical Provider, MD  dexlansoprazole (DEXILANT) 60 MG capsule Take 1 capsule (60 mg total) by mouth daily. 03/23/15   Roselee Nova, MD  Diphenhyd-Hydrocort-Nystatin (FIRST-DUKES MOUTHWASH) SUSP Use as directed 1 Squirt in the mouth or throat 2 (two) times daily. Swish and spit Patient not taking: Reported on 04/12/2015 03/29/15   Roselee Nova, MD  fluticasone [facility]) 50 MCG/ACT nasal spray Place 2 sprays into both nostrils daily as needed for rhinitis.    Historical Provider, MD  Fluticasone Furoate-Vilanterol 100-25 MCG/INH AEPB Inhale 1 puff into the lungs 2 (two) times daily.     Historical Provider, MD  furosemide (LASIX) 20 MG tablet Take 1 tablet (20 mg total) by mouth every other day. Patient not taking: Reported on 04/12/2015 11/17/14   Gladstone Lighter, MD  furosemide (LASIX) 40 MG tablet Take 1 tablet (40 mg total) by mouth 2 (two) times daily. 02/08/15   Dustin Flock, MD  guaiFENesin-dextromethorphan (ROBITUSSIN DM) 100-10 MG/5ML syrup Take 5-10 mLs by mouth every 4 (four) hours as needed for cough.     Historical Provider, MD  HYDROcodone-acetaminophen (NORCO/VICODIN) 5-325 MG tablet Take 1 tablet by mouth every 8 (eight) hours as needed for moderate pain. 05/12/15   Roselee Nova, MD  ipratropium-albuterol (DUONEB) 0.5-2.5 (3) MG/3ML SOLN Take 3 mLs by nebulization every 6 (six) hours. Patient taking differently: Take 3 mLs by nebulization every 6 (six) hours as needed (for wheezing/shortness of breath).  04/02/15   Roselee Nova, MD  loperamide (IMODIUM) 2 MG capsule Take 2-4 mg by mouth as needed for diarrhea or loose stools.    Historical Provider, MD  Magnesium 250 MG TABS Take 250 mg by mouth daily.    Historical Provider, MD  morphine  (MS CONTIN) 15 MG 12 hr tablet Take 1 tablet (15 mg total) by mouth 2 (two) times daily. Pt takes with a 30mg  tablet. 04/15/15   Demetrios Loll, MD  morphine (MS CONTIN) 30 MG 12 hr tablet Take 1 tablet (30 mg total) by mouth 2 (two) times daily. Pt takes with a 15mg  tablet. 04/15/15   Demetrios Loll, MD  moxifloxacin (AVELOX) 400 MG tablet Take 1 tablet (400 mg total) by mouth daily at 8 pm. 05/03/15   Roselee Nova, MD  nystatin (MYCOSTATIN) 100000 UNIT/ML suspension  03/31/15   Historical Provider, MD  oxybutynin (DITROPAN) 5 MG tablet Take 1 tablet (5 mg total) by mouth daily. 11/17/14   Gladstone Lighter, MD  pantoprazole (PROTONIX) 40 MG tablet Take 1 tablet (40 mg total) by mouth 2 (two)  times daily. Patient not taking: Reported on 04/12/2015 11/08/14   Vaughan Basta, MD  predniSONE (DELTASONE) 20 MG tablet Take 2 tablets (40 mg total) by mouth daily. 05/12/15   Carrie Mew, MD  pregabalin (LYRICA) 150 MG capsule Take 1 capsule (150 mg total) by mouth 2 (two) times daily. 05/11/15   Roselee Nova, MD  propranolol ER (INDERAL LA) 80 MG 24 hr capsule Take 80 mg by mouth daily.    Historical Provider, MD  ranitidine (ZANTAC) 150 MG tablet Take 150 mg by mouth 3 (three) times daily as needed for heartburn.    Historical Provider, MD  rOPINIRole (REQUIP) 0.25 MG tablet Take 3 tablets (0.75 mg total) by mouth at bedtime. 01/26/15   Roselee Nova, MD  tiotropium (SPIRIVA) 18 MCG inhalation capsule Place 18 mcg into inhaler and inhale daily.    Historical Provider, MD      VITAL SIGNS:  Blood pressure 133/58, pulse 65, temperature 97.7 F (36.5 C), temperature source Oral, resp. rate 20, height 5\' 4"  (1.626 m), weight 146.058 kg (322 lb), SpO2 99 %.  PHYSICAL EXAMINATION:  GENERAL:  65 y.o.-year-old patient lying in the bed with no acute distress. Obese. EYES: Pupils equal, round, reactive to light and accommodation. No scleral icterus. Extraocular muscles intact.  HEENT: Head atraumatic,  normocephalic. Oropharynx and nasopharynx clear.  NECK:  Supple, no jugular venous distention. No thyroid enlargement, no tenderness.  LUNGS: Normal breath sounds bilaterally, has expiratory wheezing, no rales,rhonchi or crepitation. No use of accessory muscles of respiration.  CARDIOVASCULAR: S1, S2 normal. No murmurs, rubs, or gallops.  ABDOMEN: Soft, nontender, nondistended. Bowel sounds present. No organomegaly or mass.  EXTREMITIES: No pedal edema, cyanosis, or clubbing.  NEUROLOGIC: Cranial nerves II through XII are intact. Muscle strength 5/5 in all extremities. Sensation intact. Gait not checked.  PSYCHIATRIC: The patient is alert and oriented x 3.  SKIN: No obvious rash, lesion, or ulcer.   LABORATORY PANEL:   CBC  Recent Labs Lab 05/12/15 1230  WBC 7.0  HGB 12.6  HCT 39.9  PLT 217   ------------------------------------------------------------------------------------------------------------------  Chemistries   Recent Labs Lab 05/12/15 1230  NA 140  K 4.0  CL 106  CO2 25  GLUCOSE 114*  BUN 8  CREATININE 0.64  CALCIUM 9.5   ------------------------------------------------------------------------------------------------------------------  Cardiac Enzymes  Recent Labs Lab 05/12/15 1230  TROPONINI <0.03   ------------------------------------------------------------------------------------------------------------------  RADIOLOGY:  Dg Chest 2 View  05/12/2015  CLINICAL DATA:  Central chest pain this morning with history of COPD EXAM: CHEST  2 VIEW COMPARISON:  04/12/2015 FINDINGS: Limited inspiratory effect. Mild to moderate cardiac silhouette enlargement stable. Vascular pattern normal and lungs clear allowing for limited inspiration. Lateral radiograph significantly limited by body habitus. IMPRESSION: Study limited by body habitus and limited inspiratory effect. Stable cardiac enlargement with no acute findings. Electronically Signed   By: Skipper Cliche M.D.    On: 05/12/2015 13:29    EKG:   Orders placed or performed during the hospital encounter of 05/12/15  . EKG 12-Lead  . EKG 12-Lead  . ED EKG within 10 minutes  . ED EKG within 10 minutes    IMPRESSION AND PLAN:   COPD exacerbation Chronic respiratory failure The patient will be admitted to medical floor. Continue oxygen by nasal cannula, albuterol when necessary. Start IV Solu-Medrol, Robitussin, continue Pulmicort and Spiriva.  Hypertension. Controlled. Continue Lasix.  All the records are reviewed and case discussed with ED provider. Management plans discussed with the  patient, family and they are in agreement.  CODE STATUS: Full code  TOTAL TIME TAKING CARE OF THIS PATIENT: 55 minutes.    Demetrios Loll M.D on 05/12/2015 at 4:27 PM  Between 7am to 6pm - Pager - [phone]  After 6pm go to [email or web address] - password [email or web address] [facility]  [facility]  Office  [phone]  CC: Primary care physician; Keith Rake, MD

## 2015-05-12 NOTE — ED Provider Notes (Addendum)
[facility] Emergency Department Provider Note  ____________________________________________  Time seen: 12:10 PM  I have reviewed the triage vital signs and the nursing notes.   HISTORY  Chief Complaint Chest Pain    HPI Loretta Wade is a 65 y.o. female who complains of chest tightness, anterior chest, nonradiating. Not exertional. No vomiting or diaphoresis. It is associated with shortness of breath and increased wheezing. She has COPD and feels like it's worse. She uses 2 L nasal cannula at home at all times, and her oxygen saturation on 2 L is 96% per EMS. This is gradual onset over last 2 days. She also has some nausea vomiting and diarrhea that started today as well. Also reports a nonproductive cough and sore throat.     Past Medical History  Diagnosis Date  . OA (osteoarthritis)   . Fibromyalgia   . Anemia   . Sleep apnea   . Fatigue   . Edema   . Esophageal reflux   . Nocturia   . Hypertension   . COPD (chronic obstructive pulmonary disease) [facility])      Patient Active Problem List   Diagnosis Date Noted  . COPD exacerbation ([place]) 04/12/2015  . Poor mobility 04/12/2015  . Fibromyalgia 03/23/2015  . Esophageal reflux 03/23/2015  . MRSA infection 11/17/2014  . Diastolic CHF, acute on chronic (HCC) 11/12/2014  . Morbid obesity with BMI of 50.0-59.9, adult ([place]) 11/12/2014  . Neck pain, chronic 11/12/2014  . COPD, frequent exacerbations ([place]) 11/04/2014     Past Surgical History  Procedure Laterality Date  . Cholecystectomy    . Total knee arthroplasty      right  . Vesicovaginal fistula closure w/ tah    . Abdominal hysterectomy       Current Outpatient Rx  Name  Route  Sig  Dispense  Refill  . acetaminophen (TYLENOL) 325 MG tablet   Oral   Take 2 tablets (650 mg total) by mouth every 6 (six) hours as needed for mild pain (or Fever >/= 101).   30 tablet   0   . albuterol (PROVENTIL HFA;VENTOLIN HFA) 108 (90 Base)  MCG/ACT inhaler   Inhalation   Inhale 2 puffs into the lungs every 6 (six) hours as needed for wheezing or shortness of breath.         Marland Kitchen albuterol (PROVENTIL) (2.5 MG/3ML) 0.083% nebulizer solution   Nebulization   Take 3 mLs (2.5 mg total) by nebulization every 6 (six) hours as needed for wheezing or shortness of breath. Patient not taking: Reported on 04/12/2015   75 mL   12   . azithromycin (ZITHROMAX Z-PAK) 250 MG tablet      Take 2 tablets (500 mg) on  Day 1,  followed by 1 tablet (250 mg) once daily on Days 2 through 5.   6 each   0   . benzonatate (TESSALON) 200 MG capsule   Oral   Take 1 capsule (200 mg total) by mouth 3 (three) times daily as needed for cough.   20 capsule   0   . budesonide (PULMICORT) 0.5 MG/2ML nebulizer solution   Nebulization   Take 0.5 mg by nebulization 2 (two) times daily.          . Chlorphen-Phenyleph-APAP (CORICIDIN D COLD/FLU/SINUS) 2-5-325 MG TABS   Oral   Take 2 tablets by mouth daily as needed (for nasal congestion/cough).         . cholecalciferol (VITAMIN D) 1000 UNITS tablet  Oral   Take 1,000 Units by mouth daily.          . citalopram (CELEXA) 20 MG tablet   Oral   Take 20 mg by mouth daily.         . colesevelam (WELCHOL) 625 MG tablet   Oral   Take 625 mg by mouth 2 (two) times daily with a meal.         . dexlansoprazole (DEXILANT) 60 MG capsule   Oral   Take 1 capsule (60 mg total) by mouth daily.   90 capsule   0   . Diphenhyd-Hydrocort-Nystatin (FIRST-DUKES MOUTHWASH) SUSP   Mouth/Throat   Use as directed 1 Squirt in the mouth or throat 2 (two) times daily. Swish and spit Patient not taking: Reported on 04/12/2015   1 Bottle   0   . fluticasone (FLONASE) 50 MCG/ACT nasal spray   Each Nare   Place 2 sprays into both nostrils daily as needed for rhinitis.         . Fluticasone Furoate-Vilanterol 100-25 MCG/INH AEPB   Inhalation   Inhale 1 puff into the lungs 2 (two) times daily.          .  furosemide (LASIX) 20 MG tablet   Oral   Take 1 tablet (20 mg total) by mouth every other day. Patient not taking: Reported on 04/12/2015   30 tablet   0   . furosemide (LASIX) 40 MG tablet   Oral   Take 1 tablet (40 mg total) by mouth 2 (two) times daily.   30 tablet   60   . guaiFENesin-dextromethorphan (ROBITUSSIN DM) 100-10 MG/5ML syrup   Oral   Take 5-10 mLs by mouth every 4 (four) hours as needed for cough.          Marland Kitchen HYDROcodone-acetaminophen (NORCO/VICODIN) 5-325 MG tablet   Oral   Take 1 tablet by mouth every 8 (eight) hours as needed for moderate pain.   15 tablet   0   . ipratropium-albuterol (DUONEB) 0.5-2.5 (3) MG/3ML SOLN   Nebulization   Take 3 mLs by nebulization every 6 (six) hours. Patient taking differently: Take 3 mLs by nebulization every 6 (six) hours as needed (for wheezing/shortness of breath).    360 mL   0   . loperamide (IMODIUM) 2 MG capsule   Oral   Take 2-4 mg by mouth as needed for diarrhea or loose stools.         . Magnesium 250 MG TABS   Oral   Take 250 mg by mouth daily.         Marland Kitchen morphine (MS CONTIN) 15 MG 12 hr tablet   Oral   Take 1 tablet (15 mg total) by mouth 2 (two) times daily. Pt takes with a 30mg  tablet.   10 tablet   0   . morphine (MS CONTIN) 30 MG 12 hr tablet   Oral   Take 1 tablet (30 mg total) by mouth 2 (two) times daily. Pt takes with a 15mg  tablet.   10 tablet   0   . moxifloxacin (AVELOX) 400 MG tablet   Oral   Take 1 tablet (400 mg total) by mouth daily at 8 pm.   7 tablet   0   . nystatin (MYCOSTATIN) 100000 UNIT/ML suspension               . oxybutynin (DITROPAN) 5 MG tablet   Oral   Take 1 tablet (5 mg total)  by mouth daily.   30 tablet   0   . pantoprazole (PROTONIX) 40 MG tablet   Oral   Take 1 tablet (40 mg total) by mouth 2 (two) times daily. Patient not taking: Reported on 04/12/2015   60 tablet   0   . predniSONE (DELTASONE) 20 MG tablet   Oral   Take 2 tablets (40 mg total)  by mouth daily.   8 tablet   0   . pregabalin (LYRICA) 150 MG capsule   Oral   Take 1 capsule (150 mg total) by mouth 2 (two) times daily.   60 capsule   0   . propranolol ER (INDERAL LA) 80 MG 24 hr capsule   Oral   Take 80 mg by mouth daily.         . ranitidine (ZANTAC) 150 MG tablet   Oral   Take 150 mg by mouth 3 (three) times daily as needed for heartburn.         Marland Kitchen rOPINIRole (REQUIP) 0.25 MG tablet   Oral   Take 3 tablets (0.75 mg total) by mouth at bedtime.   90 tablet   2   . tiotropium (SPIRIVA) 18 MCG inhalation capsule   Inhalation   Place 18 mcg into inhaler and inhale daily.            Allergies Review of patient's allergies indicates no known allergies.   Family History  Problem Relation Age of Onset  . Hypertension Mother   . Diabetes    . Breast cancer Daughter     Social History Social History  Substance Use Topics  . Smoking status: Former Smoker    Quit date: 11/04/1983  . Smokeless tobacco: None  . Alcohol Use: No    Review of Systems  Constitutional:   No fever or chills. No weight changes Eyes:   No blurry vision or double vision.  ENT:   Positive sore throat.  Cardiovascular:   Positive chest pain. Respiratory:   Positive shortness of breath and cough. Gastrointestinal:   Negative for abdominal pain, vomiting and diarrhea.  No BRBPR or melena. Genitourinary:   Negative for dysuria or difficulty urinating. Musculoskeletal:   Negative for back pain. No joint swelling or pain. Skin:   Negative for rash. Neurological:   Negative for headaches, focal weakness or numbness. Psychiatric:  Chronic depression, no suicidal ideation or hallucinations..   Endocrine:  No changes in energy or sleep difficulty.  10-point ROS otherwise negative.  ____________________________________________   PHYSICAL EXAM:  VITAL SIGNS: ED Triage Vitals  Enc Vitals Group     BP 05/12/15 1205 127/63 mmHg     Pulse Rate 05/12/15 1205 76      Resp 05/12/15 1205 18     Temp 05/12/15 1205 97.7 F (36.5 C)     Temp Source 05/12/15 1205 Oral     SpO2 05/12/15 1205 97 %     Weight 05/12/15 1205 322 lb (146.058 kg)     Height 05/12/15 1205 5\' 4"  (1.626 m)     Head Cir --      Peak Flow --      Pain Score 05/12/15 1206 9     Pain Loc --      Pain Edu? --      Excl. in [place]? --     Vital signs reviewed, nursing assessments reviewed.   Constitutional:   Alert and oriented. Well appearing and in no distress. Morbidly obese Eyes:  No scleral icterus. No conjunctival pallor. PERRL. EOMI ENT   Head:   Normocephalic and atraumatic.   Nose:   No congestion/rhinnorhea. No septal hematoma   Mouth/Throat:   MMM, no pharyngeal erythema. No peritonsillar mass.    Neck:   No stridor. No SubQ emphysema. No meningismus. Hematological/Lymphatic/Immunilogical:   No cervical lymphadenopathy. Cardiovascular:   RRR. Symmetric bilateral radial and DP pulses.  No murmurs.  Respiratory:   Normal respiratory effort without tachypnea nor retractions. Diminished air entry diffusely. Expiratory wheezing. Prolonged expiratory phase.  Gastrointestinal:   Soft and nontender. Non distended. There is no CVA tenderness.  No rebound, rigidity, or guarding. Genitourinary:   deferred Musculoskeletal:   Nontender with normal range of motion in all extremities. No joint effusions.  No lower extremity tenderness.  No edema. Neurologic:   Normal speech and language.  CN 2-10 normal. Motor grossly intact. No gross focal neurologic deficits are appreciated.  Skin:    Skin is warm, dry and intact. No rash noted.  No petechiae, purpura, or bullae. Psychiatric:   Mood and affect are normal. ____________________________________________    LABS (pertinent positives/negatives) (all labs ordered are listed, but only abnormal results are displayed) Labs Reviewed  BASIC METABOLIC PANEL - Abnormal; Notable for the following:    Glucose, Bld 114 (*)    All  other components within normal limits  CBC - Abnormal; Notable for the following:    MCHC 31.7 (*)    RDW 18.9 (*)    All other components within normal limits  RAPID INFLUENZA A&B ANTIGENS ([facility] ONLY)  TROPONIN I   ____________________________________________   EKG  Interpreted by me Sinus rhythm rate of 68, normal axis and intervals. Normal QRS ST segments and T waves.  ____________________________________________    RADIOLOGY  Chest x-ray unremarkable  ____________________________________________   PROCEDURES   ____________________________________________   INITIAL IMPRESSION / ASSESSMENT AND PLAN / ED COURSE  Pertinent labs & imaging results that were available during my care of the patient were reviewed by me and considered in my medical decision making (see chart for details).  She presents with constellation of symptoms suggestive of viral respiratory infection, possibly influenza.Considering the patient's symptoms, medical history, and physical examination today, I have low suspicion for ACS, PE, TAD, pneumothorax, carditis, mediastinitis, pneumonia, CHF, or sepsis.  We'll give steroids and DuoNeb's, and reassess. She is tachypnea can wheezing.  ----------------------------------------- 1:36 PM on 05/12/2015 -----------------------------------------  Labs and chest x-ray unremarkable. Patient not feeling any better. We'll give IV Toradol and a second nebulizer treatment with 5 mg of albuterol.  ----------------------------------------- 4:06 PM on 05/12/2015 -----------------------------------------  Still no improvement. Influenza test negative. We'll admit for COPD exacerbation.   ____________________________________________   FINAL CLINICAL IMPRESSION(S) / ED DIAGNOSES  Final diagnoses:  COPD exacerbation ([place])      Carrie Mew, MD 05/12/15 [place], MD 05/12/15 2040

## 2015-05-12 NOTE — ED Notes (Signed)
Pt assisted to toilet 

## 2015-05-12 NOTE — Telephone Encounter (Signed)
Routed to Dr. Shah for approval 

## 2015-05-12 NOTE — Discharge Instructions (Signed)

## 2015-05-12 NOTE — ED Notes (Signed)
Pt arrived via EMS from home for complaints of CP and SOB. EMS reports 130/86, 96% 2L, 12 lead unremarkable. EMS reprots lungs CTA.

## 2015-05-12 NOTE — ED Notes (Signed)
Pt was taken to [facility] on room 219. Pt was transported by myself and EDT [place]

## 2015-05-13 ENCOUNTER — Inpatient Hospital Stay: Payer: Medicare PPO

## 2015-05-13 ENCOUNTER — Telehealth: Payer: Self-pay

## 2015-05-13 LAB — SEDIMENTATION RATE: Sed Rate: 33 mm/hr — ABNORMAL HIGH (ref 0–30)

## 2015-05-13 LAB — HEMOGLOBIN A1C: HEMOGLOBIN A1C: 5.5 % (ref 4.0–6.0)

## 2015-05-13 LAB — URINALYSIS COMPLETE WITH MICROSCOPIC (ARMC ONLY)
Bacteria, UA: NONE SEEN
Bilirubin Urine: NEGATIVE
Glucose, UA: NEGATIVE mg/dL
Hgb urine dipstick: NEGATIVE
Ketones, ur: NEGATIVE mg/dL
Leukocytes, UA: NEGATIVE
Nitrite: NEGATIVE
PH: 5 (ref 5.0–8.0)
Protein, ur: NEGATIVE mg/dL
Specific Gravity, Urine: 1.015 (ref 1.005–1.030)

## 2015-05-13 LAB — URINE DRUG SCREEN, QUALITATIVE (ARMC ONLY)
AMPHETAMINES, UR SCREEN: NOT DETECTED
BENZODIAZEPINE, UR SCRN: NOT DETECTED
Barbiturates, Ur Screen: NOT DETECTED
COCAINE METABOLITE, UR ~~LOC~~: NOT DETECTED
Cannabinoid 50 Ng, Ur ~~LOC~~: NOT DETECTED
MDMA (Ecstasy)Ur Screen: NOT DETECTED
METHADONE SCREEN, URINE: NOT DETECTED
OPIATE, UR SCREEN: POSITIVE — AB
Phencyclidine (PCP) Ur S: NOT DETECTED
TRICYCLIC, UR SCREEN: NOT DETECTED

## 2015-05-13 MED ORDER — BUDESONIDE 0.5 MG/2ML IN SUSP
0.5000 mg | Freq: Two times a day (BID) | RESPIRATORY_TRACT | Status: DC
  Administered 2015-05-14: 0.5 mg via RESPIRATORY_TRACT
  Filled 2015-05-13: qty 2

## 2015-05-13 MED ORDER — AZITHROMYCIN 250 MG PO TABS
500.0000 mg | ORAL_TABLET | Freq: Every day | ORAL | Status: AC
  Administered 2015-05-13: 500 mg via ORAL
  Filled 2015-05-13: qty 2

## 2015-05-13 MED ORDER — IOHEXOL 350 MG/ML SOLN
80.0000 mL | Freq: Once | INTRAVENOUS | Status: AC | PRN
  Administered 2015-05-13: 80 mL via INTRAVENOUS

## 2015-05-13 MED ORDER — GUAIFENESIN ER 600 MG PO TB12
600.0000 mg | ORAL_TABLET | Freq: Two times a day (BID) | ORAL | Status: DC
  Administered 2015-05-13 – 2015-05-16 (×7): 600 mg via ORAL
  Filled 2015-05-13 (×8): qty 1

## 2015-05-13 MED ORDER — PSEUDOEPHEDRINE HCL ER 120 MG PO TB12
120.0000 mg | ORAL_TABLET | Freq: Two times a day (BID) | ORAL | Status: DC | PRN
  Filled 2015-05-13: qty 1

## 2015-05-13 MED ORDER — HYDROCOD POLST-CPM POLST ER 10-8 MG/5ML PO SUER
5.0000 mL | Freq: Two times a day (BID) | ORAL | Status: DC
  Administered 2015-05-13 – 2015-05-16 (×7): 5 mL via ORAL
  Filled 2015-05-13 (×8): qty 5

## 2015-05-13 MED ORDER — AZITHROMYCIN 250 MG PO TABS
250.0000 mg | ORAL_TABLET | Freq: Every day | ORAL | Status: DC
  Administered 2015-05-14 – 2015-05-16 (×3): 250 mg via ORAL
  Filled 2015-05-13 (×3): qty 1

## 2015-05-13 NOTE — Care Management Note (Signed)
Case Management Note  Patient Details  Name: Loretta Wade MRN: [id number] Date of Birth: 1950-10-13  Subjective/Objective:          Patient admitted from home with COPD.  Patient lives at home alone in [facility] section 8 housing.  Patient states that she has a walker and shower chair in the home.  Patient states that she obtains her medications through [facility].  Patient states that her friends/family provide her with transportation when needed. Patient is open with [facility] home [facility].  Flo Shanks with [facility] aware of admission.  Patient has chronic O2 through [facility].  Patient states that she is satisfied with their services. PT is currently recommending home health PT.          Action/Plan: RNCM following   Expected Discharge Date:                  Expected Discharge Plan:     In-House Referral:     Discharge planning Services     Post Acute Care Choice:    Choice offered to:     DME Arranged:    DME Agency:     HH Arranged:    [place] Agency:     Status of Service:     Medicare Important Message Given:    Date Medicare IM Given:    Medicare IM give by:    Date Additional Medicare IM Given:    Additional Medicare Important Message give by:     If discussed at [place] of Stay Meetings, dates discussed:    Additional Comments:  Beverly Sessions, RN 05/13/2015, 4:21 PM

## 2015-05-13 NOTE — Progress Notes (Signed)
[place] at [place] NAME: Loretta Wade    MR#:  [id number]  DATE OF BIRTH:  Nov 29, 1950  SUBJECTIVE:  CHIEF COMPLAINT:   Chief Complaint  Patient presents with  . Chest Pain   patient is 65 year old female with past medical history significant for history of COPD, anxiety, sleep apnea, essential hypertension, morbid obesity, who presents to the hospital with complaints of cough, chest tightness, wheezing, shortness of breath. Today, she is coughing but not able to bring much phlegm. She is also complaining of significant headache all over her head, especially around her temples and the eyeballs.   Review of Systems  Eyes: Positive for photophobia and pain.  Neurological: Positive for weakness.    VITAL SIGNS: Blood pressure 124/64, pulse 81, temperature 98.2 F (36.8 C), temperature source Oral, resp. rate 18, height 5\' 4"  (1.626 m), weight 146.058 kg (322 lb), SpO2 96 %.  PHYSICAL EXAMINATION:   GENERAL:  65 y.o.-year-old patient lying in the bed with no acute distress.  EYES: Pupils equal, round, reactive to light and accommodation. No scleral icterus. Extraocular muscles intact.  HEENT: Head atraumatic, normocephalic. Oropharynx and nasopharynx clear.  NECK:  Supple, no jugular venous distention. No thyroid enlargement, no tenderness.  LUNGS: Diminished breath sounds bilaterally, scattered wheezing, no rales,rhonchi or crepitation. Intermittent use of accessory muscles of respiration, especially with cough, which is paroxysmal.  CARDIOVASCULAR: S1, S2 normal. No murmurs, rubs, or gallops.  ABDOMEN: Soft, nontender, nondistended, morbidly obese. Bowel sounds present. No organomegaly or mass.  EXTREMITIES: No pedal edema, cyanosis, or clubbing.  NEUROLOGIC: Cranial nerves II through XII are intact. Muscle strength 5/5 in all extremities. Sensation intact. Gait not checked.  PSYCHIATRIC: The patient is alert and oriented x 3.   SKIN: No obvious rash, lesion, or ulcer.   ORDERS/RESULTS REVIEWED:   CBC  Recent Labs Lab 05/12/15 1230  WBC 7.0  HGB 12.6  HCT 39.9  PLT 217  MCV 91.5  MCH 29.0  MCHC 31.7*  RDW 18.9*   ------------------------------------------------------------------------------------------------------------------  Chemistries   Recent Labs Lab 05/12/15 1230  NA 140  K 4.0  CL 106  CO2 25  GLUCOSE 114*  BUN 8  CREATININE 0.64  CALCIUM 9.5  MG 2.1   ------------------------------------------------------------------------------------------------------------------ estimated creatinine clearance is 105.1 mL/min (by C-G formula based on Cr of 0.64). ------------------------------------------------------------------------------------------------------------------ No results for input(s): TSH, T4TOTAL, T3FREE, THYROIDAB in the last 72 hours.  Invalid input(s): FREET3  Cardiac Enzymes  Recent Labs Lab 05/12/15 1230  TROPONINI <0.03   ------------------------------------------------------------------------------------------------------------------ Invalid input(s): POCBNP ---------------------------------------------------------------------------------------------------------------  RADIOLOGY: Dg Chest 2 View  05/12/2015  CLINICAL DATA:  Central chest pain this morning with history of COPD EXAM: CHEST  2 VIEW COMPARISON:  04/12/2015 FINDINGS: Limited inspiratory effect. Mild to moderate cardiac silhouette enlargement stable. Vascular pattern normal and lungs clear allowing for limited inspiration. Lateral radiograph significantly limited by body habitus. IMPRESSION: Study limited by body habitus and limited inspiratory effect. Stable cardiac enlargement with no acute findings. Electronically Signed   By: Skipper Cliche M.D.   On: 05/12/2015 13:29    EKG:  Orders placed or performed during the hospital encounter of 05/12/15  . EKG 12-Lead  . EKG 12-Lead  . ED EKG within 10  minutes  . ED EKG within 10 minutes    ASSESSMENT AND PLAN:  Principal Problem:   COPD exacerbation ([place])  #1 COPD exacerbation, seemed to be improving on current medications, continue current management, patient is on  her usual doses of oxygen at present #2. Acute bronchitis, initiate Zithromax , get sputum cultures if possible. #3. Headache, likely cough related, get exercises, sedimentation rate, and CTA of head, as patient is concerned about possible aneurysm was known family history of berry aneurysms and sudden demise. #4. Hyperglycemia, get hemoglobin A1c checked  Management plans discussed with the patient, family and they are in agreement.   DRUG ALLERGIES: No Known Allergies  CODE STATUS:     Code Status Orders        Start     Ordered   05/12/15 1805  Full code   Continuous     05/12/15 1804    Code Status History    Date Active Date Inactive Code Status Order ID Comments User Context   04/12/2015  5:08 PM 04/15/2015  6:04 PM Full Code [id number]  Lytle Butte, MD ED   01/28/2015  8:08 AM 02/08/2015  2:34 PM Full Code [id number]  Loletha Grayer, MD ED   11/13/2014  1:02 PM 11/19/2014  4:39 PM Full Code [id number]  Theodoro Grist, MD Inpatient   11/04/2014  5:50 PM 11/08/2014  4:37 PM Full Code [id number]  Vaughan Basta, MD Inpatient      TOTAL TIME TAKING CARE OF THIS PATIENT: 35  minutes.    Theodoro Grist M.D on 05/13/2015 at 12:40 PM  Between 7am to 6pm - Pager - [phone]  After 6pm go to [email or web address] - password [email or web address] [facility]  [place] [facility]  Office  [phone]  CC: Primary care physician; Keith Rake, MD

## 2015-05-13 NOTE — Evaluation (Signed)
Physical Therapy Evaluation Patient Details Name: Loretta Wade MRN: [id number] DOB: Sep 17, 1950 Today's Date: 05/13/2015   History of Present Illness  Loretta Wade is a 65 y.o. female with a known history of COPD, sleep apnea, hypertension. Patient complains of cough for several weeks. She has had a chest tightness, cough with sputum, wheezing and shortness of breath for the past few days. She has chronic respiratory failure, on home oxygen 2 L by nasal cannula. She denies any fever or chills, no orthopnea, nocturnal dyspnea or leg edema. Chest x-ray didn't show any infiltrate. She was treated with nebulizer ED, but is still has wheezing and shortness of breath. This is 4th admission in the past the 6 months. Pt denies falls in the last 12 months  Clinical Impression  Overall it appears that with respect to strength and mobility pt is close to her baseline function. She does become very dyspneic on exertion however vitals remain within acceptable limits. SaO2 remains at or above 92% on 2L/min O2 which is chronic for patient. HR remains below 100 bpm. Strength is functional for transfers and ambulation. Balance is well corrected with rolling walker which patient has at home. She would benefit from [facility] PT to in order to improve endurance at home and decrease risk for readmission. Pt will benefit from skilled PT services to address deficits in strength, balance, and mobility in order to return to full function at home.     Follow Up Recommendations Home health PT    Equipment Recommendations  None recommended by PT    Recommendations for Other Services       Precautions / Restrictions Precautions Precautions: Fall Restrictions Weight Bearing Restrictions: No      Mobility  Bed Mobility Overal bed mobility: Modified Independent             General bed mobility comments: Good speed and sequencing. Use of bed rails. DOE  Transfers Overall transfer level: Needs  assistance Equipment used: Rolling walker (2 wheeled) Transfers: Sit to/from Stand Sit to Stand: Min guard         General transfer comment: Pt demonstrates safe hand placement and good LE strength/power during transfer. No evidence for instability noted  Ambulation/Gait Ambulation/Gait assistance: Min guard Ambulation Distance (Feet): 20 Feet Assistive device: Rolling walker (2 wheeled) (Bariatric) Gait Pattern/deviations: Decreased step length - right;Decreased step length - left Gait velocity: Decreased Gait velocity interpretation: Below normal speed for age/gender General Gait Details: Pt with reasonable speed and stability for household ambulation. Reports that mobility is close to baseline. Reports DOE with expiratory wheezes. SaO2 remains at or above 92% on 2 L/min O2 during ambulation. 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 [place] (Stroke Patients Only)       Balance Overall balance assessment: Needs assistance Sitting-balance support: No upper extremity supported Sitting balance-[name] Scale: Good     Standing balance support: No upper extremity supported Standing balance-[name] Scale: Fair                               Pertinent Vitals/Pain Pain Assessment: 0-10 Pain Score: 9  Pain Location: Central to L side chest pain, back, legs Pain Intervention(s): Limited activity within patient's tolerance;Monitored during session;Other (comment) (RN is aware, discussed prior to session)    Home Living Family/patient expects to be discharged to:: Assisted  living               Home Equipment: Gilford Rile - 2 wheels;Hospital bed;Wheelchair - manual;Bedside commode;Grab bars - tub/shower Additional Comments: Pt has been receiving HH services through [facility]. Pt reports that she will be recieving HH services through [facility] until 05/28/15. Pt states that she does not receive assistance from the  facility where she lives.    Prior Function Level of Independence: Needs assistance   Gait / Transfers Assistance Needed: Pt ambulates with rolling walker. States she occasionally uses wheelchair as well.  ADL's / Homemaking Assistance Needed: Family assists with meals/groceries  Comments: Pt reports she has been receiving assist from [employer] with bathing/dressing.     Hand Dominance   Dominant Hand: Right    Extremity/Trunk Assessment   Upper Extremity Assessment: Overall WFL for tasks assessed           Lower Extremity Assessment: Overall WFL for tasks assessed         Communication   Communication: No difficulties  Cognition Arousal/Alertness: Awake/alert Behavior During Therapy: WFL for tasks assessed/performed Overall Cognitive Status: Within Functional Limits for tasks assessed                      General Comments      Exercises General Exercises - Lower Extremity Ankle Circles/Pumps: Strengthening;Both;10 reps;Supine Quad Sets: Strengthening;Both;10 reps;Supine Gluteal Sets: Strengthening;Both;10 reps;Supine Long Arc Quad: Strengthening;Both;10 reps;Seated Hip ABduction/ADduction: Strengthening;Both;10 reps;Supine Straight Leg Raises: Strengthening;Both;10 reps;Supine      Assessment/Plan    PT Assessment Patient needs continued PT services  PT Diagnosis Difficulty walking;Generalized weakness   PT Problem List Decreased strength;Decreased balance;Decreased mobility;Cardiopulmonary status limiting activity;Obesity  PT Treatment Interventions DME instruction;Gait training;Functional mobility training;Therapeutic activities;Therapeutic exercise;Balance training;Neuromuscular re-education;Patient/family education   PT Goals (Current goals can be found in the Care Plan section) Acute Rehab PT Goals Patient Stated Goal: "I want to breath better." PT Goal Formulation: With patient Time For Goal Achievement: 05/27/15 Potential to Achieve Goals:  Fair    Frequency Min 2X/week   Barriers to discharge Decreased caregiver support Lives alone    Co-evaluation               End of Session Equipment Utilized During Treatment: Gait belt;Oxygen Activity Tolerance: Patient tolerated treatment well;Other (comment) (Somewhat limited by DOE) Patient left: in chair;with call bell/phone within reach;with chair alarm set Nurse Communication: Mobility status         Time: [id number] PT Time Calculation (min) (ACUTE ONLY): 26 min   Charges:   PT Evaluation $PT Eval Moderate Complexity: 1 Procedure PT Treatments $Therapeutic Exercise: 8-22 mins   PT G Codes:       Lyndel Safe Huprich PT, DPT   Huprich,Jason 05/13/2015, 1:43 PM

## 2015-05-13 NOTE — Telephone Encounter (Signed)
Rx was faxed and called in to [facility]

## 2015-05-13 NOTE — Progress Notes (Signed)
Patient is currently receiving skilled nursing services from [facility]. She lives alone with family checking on her. She is oxygen dependent on 2 liters at home. Patient seen lying in bed, appeared to be sleeping soundly. Per chart note review Physical therapy has recommended home health PT, patient has received physical therapy at home in the past. Will need resumption of care orders at discharge. [facility] team has also requested that an order for social work be added, CSW [name] made aware. Will continue to follow through final disposition. Thank you. Flo Shanks RN, BSN, [facility] liaison [phone] c

## 2015-05-13 NOTE — Progress Notes (Signed)
Spoke with Dr. Jannifer Franklin. Pt was concerned that her dexilant from home had not been ordered. Pt ordered one- time protonix. Pt also concerned that she has a headache. Pt believes that it is from not having her propanolol today. BPs throughout the day have been stable. Dr Jannifer Franklin alerted to pts concerns. No new orders at this time.

## 2015-05-13 NOTE — Progress Notes (Signed)
OT Cancellation Note  Patient Details Name: Loretta Wade MRN: [id number] DOB: Dec 18, 1950   Cancelled Treatment:    Reason Eval/Treat Not Completed: OT screened, no needs identified, will sign off.  Chart reviewed and met with patient and no needs identified and screening completed only.  Pt is using pursed lip breathing and energy conservation tech and has [facility] to help with showers and PT at home.  Please re-consult if other needs arise.  Thank you for the referral.   Harlon Ditty, OTR/L ascom [phone]  05/13/2015, 5:36 PM

## 2015-05-13 NOTE — Progress Notes (Signed)
Initial Nutrition Assessment    INTERVENTION:   Meals and Snacks: Cater to patient preferences  NUTRITION DIAGNOSIS:   No nutrition diagnosis   GOAL:   Patient will meet greater than or equal to 90% of their needs  MONITOR:   PO intake, Weight trends, Labs  REASON FOR ASSESSMENT:   Consult COPD Protocol  ASSESSMENT:    Pt admitted with COPD exacerbation, acute bronchitis  Past Medical History  Diagnosis Date  . OA (osteoarthritis)   . Fibromyalgia   . Anemia   . Sleep apnea   . Fatigue   . Edema   . Esophageal reflux   . Nocturia   . Hypertension   . COPD (chronic obstructive pulmonary disease) ([place])     Diet Order:  Diet Heart Room service appropriate?: Yes; Fluid consistency:: Thin   Energy Intake: pt ate hamburger, pasta, and yogurt at lunch today; no intake recorded. Appetite good, although pt does not really like the food  Food and nutrition related history: pt appetite good prior to admission  Skin:  Reviewed, no issues  Last BM:  3/7    Recent Labs Lab 05/12/15 1230  NA 140  K 4.0  CL 106  CO2 25  BUN 8  CREATININE 0.64  CALCIUM 9.5  MG 2.1  GLUCOSE 114*    Glucose Profile: No results for input(s): GLUCAP in the last 72 hours. Meds: lasix, solumedrol  Height:   Ht Readings from Last 1 Encounters:  05/12/15 5\' 4"  (1.626 m)    Weight: pt reports wt has been stable  Wt Readings from Last 1 Encounters:  05/12/15 322 lb (146.058 kg)    BMI:  Body mass index is 55.24 kg/(m^2).  LOW Care Level  Kerman Passey MS, [place], LDN [phone] Pager  [phone] Weekend/On-Call Pager

## 2015-05-14 ENCOUNTER — Inpatient Hospital Stay: Payer: Medicare PPO

## 2015-05-14 LAB — TROPONIN I
Troponin I: <0.03 ng/mL (ref <0.031)
Troponin I: <0.03 ng/mL (ref <0.031)

## 2015-05-14 MED ORDER — SUCRALFATE 1 G PO TABS
1.0000 g | ORAL_TABLET | Freq: Three times a day (TID) | ORAL | Status: DC
  Administered 2015-05-14 – 2015-05-16 (×8): 1 g via ORAL
  Filled 2015-05-14 (×9): qty 1

## 2015-05-14 MED ORDER — TECHNETIUM TC 99M DIETHYLENETRIAME-PENTAACETIC ACID
32.2000 | Freq: Once | INTRAVENOUS | Status: AC | PRN
  Administered 2015-05-14: 32.2 via RESPIRATORY_TRACT

## 2015-05-14 MED ORDER — TECHNETIUM TO 99M ALBUMIN AGGREGATED
4.1000 | Freq: Once | INTRAVENOUS | Status: AC | PRN
  Administered 2015-05-14: 4.1 via INTRAVENOUS

## 2015-05-14 NOTE — Progress Notes (Signed)
Physical Therapy Treatment Patient Details Name: Loretta Wade MRN: [id number] DOB: 18-Mar-1950 Today's Date: 05/14/2015    History of Present Illness Loretta Wade is a 65 y.o. female with a known history of COPD, sleep apnea, hypertension. Patient complains of cough for several weeks. She has had a chest tightness, cough with sputum, wheezing and shortness of breath for the past few days. She has chronic respiratory failure, on home oxygen 2 L by nasal cannula. She denies any fever or chills, no orthopnea, nocturnal dyspnea or leg edema. Chest x-ray didn't show any infiltrate. She was treated with nebulizer ED, but is still has wheezing and shortness of breath. This is 4th admission in the past the 6 months. Pt denies falls in the last 12 months    PT Comments    Pt progressing towards functional goals. She increased her ambulation distance and demonstrated good stability and safety. Pt mod I for bed mobility and min guard for transfers and ambulation up to 60 ft with FWW. Despite SOB, SpO2 maintained at 94 to 95% on 2L. She will benefit from continued skilled PT to continue to return to PLOF.  Follow Up Recommendations  Home health PT     Equipment Recommendations  None recommended by PT    Recommendations for Other Services       Precautions / Restrictions Precautions Precautions: Fall Restrictions Weight Bearing Restrictions: No    Mobility  Bed Mobility Overal bed mobility: Modified Independent             General bed mobility comments: Good speed and sequencing. Use of bed rails. DOE  Transfers Overall transfer level: Needs assistance Equipment used: Rolling walker (2 wheeled) Transfers: Sit to/from [employer] to Stand: Min guard Stand pivot transfers: Min guard       General transfer comment: Pt demonstrates safe hand placement and good LE strength/power during transfer. No evidence for instability  noted  Ambulation/Gait Ambulation/Gait assistance: Min guard Ambulation Distance (Feet): 60 Feet Assistive device: Rolling walker (2 wheeled) Gait Pattern/deviations: Decreased stride length;Trunk flexed     General Gait Details: Demonstrated steady gait with no LOB, including with turns. Some SOB noted but maintained 94 to 95% on 2L.   Stairs            Wheelchair Mobility    Modified Rankin (Stroke Patients Only)       Balance Overall balance assessment: Needs assistance Sitting-balance support: No upper extremity supported Sitting balance-[name] Scale: Good     Standing balance support: No upper extremity supported Standing balance-[name] Scale: Fair Standing balance comment: steady with no LOB                    Cognition Arousal/Alertness: Awake/alert Behavior During Therapy: WFL for tasks assessed/performed Overall Cognitive Status: Within Functional Limits for tasks assessed                      Exercises Other Exercises Other Exercises: Ambulated 40 ft and 60 ft with FWW and min guard with steady pace and stability. Seated therapeutic rest break between for energy conservation.    General Comments        Pertinent Vitals/Pain Pain Assessment: No/denies pain    Home Living                      Prior Function            PT Goals (current goals can now be  found in the care plan section) Acute Rehab PT Goals Patient Stated Goal: "I want to breath better." PT Goal Formulation: With patient Time For Goal Achievement: 05/27/15 Potential to Achieve Goals: Fair Progress towards PT goals: Progressing toward goals    Frequency  Min 2X/week    PT Plan Current plan remains appropriate    Co-evaluation             End of Session Equipment Utilized During Treatment: Gait belt;Oxygen Activity Tolerance: Patient tolerated treatment well;Patient limited by fatigue Patient left: in bed;with call bell/phone within reach      Time: [id number] PT Time Calculation (min) (ACUTE ONLY): 16 min  Charges:  $Gait Training: 8-22 mins                    G Codes:      Loretta Wade, PT, DPT  05/14/2015, 4:34 PM [phone]

## 2015-05-14 NOTE — Progress Notes (Addendum)
Visit made. Patient was out of the room having a ventilation perfusion scan. New order also for GI consult at patient request related to her reflux. Per staff RN Lauren and chart note review patient has been up and ambulatory today with physical therapy. She continues on Iv steroids and oral antibiotics. No discharge date at the time. Patient will need resumption of care orders at discharge as well as new order for social work. Thank you. Flo Shanks Rn, BSN, [facility] Liaison  [phone] c

## 2015-05-14 NOTE — Progress Notes (Signed)
Pt has ambulated within her room. Pt does get short of breath when ambulating within her room. Will continue to encourage ambulation.   Loretta Wade

## 2015-05-14 NOTE — Progress Notes (Signed)
[facility] [place] NAME: Leiana Kestner    MR#:  [id number]  DATE OF BIRTH:  11-May-1950  SUBJECTIVE:  CHIEF COMPLAINT:   Chief Complaint  Patient presents with  . Chest Pain   patient is 65 year old female with past medical history significant for history of COPD, anxiety, sleep apnea, essential hypertension, morbid obesity, who presents to the hospital with complaints of cough, chest tightness, wheezing, shortness of breath. She is coughing but not able to bring much phlegm, pains or chest pain, worsening with pressure on the chest and cough. Patient had CT scan of her head yesterday which showed no abnormalities Review of Systems  Eyes: Positive for photophobia and pain.  Neurological: Positive for weakness.    VITAL SIGNS: Blood pressure 125/59, pulse 65, temperature 98.3 F (36.8 C), temperature source Oral, resp. rate 19, height 5\' 4"  (1.626 m), weight 146.058 kg (322 lb), SpO2 98 %.  PHYSICAL EXAMINATION:   GENERAL:  65 y.o.-year-old patient lying in the bed in moderate respiratory distress with paroxysmal cough, discomfort in the chest when coughs.  EYES: Pupils equal, round, reactive to light and accommodation. No scleral icterus. Extraocular muscles intact.  HEENT: Head atraumatic, normocephalic. Oropharynx and nasopharynx clear.  NECK:  Supple, no jugular venous distention. No thyroid enlargement, no tenderness.  LUNGS: Diminished breath sounds bilaterally, scattered wheezing, no rales,rhonchi or crepitation. Intermittent use of accessory muscles of respiration, especially with cough, which is paroxysmal.  CARDIOVASCULAR: S1, S2 normal. No murmurs, rubs, or gallops.  ABDOMEN: Soft, nontender, nondistended, morbidly obese. Bowel sounds present. No organomegaly or mass.  EXTREMITIES: No pedal edema, cyanosis, or clubbing.  NEUROLOGIC: Cranial nerves II through XII are intact. Muscle strength 5/5 in all extremities.  Sensation intact. Gait not checked.  PSYCHIATRIC: The patient is alert and oriented x 3.  SKIN: No obvious rash, lesion, or ulcer.   ORDERS/RESULTS REVIEWED:   CBC  Recent Labs Lab 05/12/15 1230  WBC 7.0  HGB 12.6  HCT 39.9  PLT 217  MCV 91.5  MCH 29.0  MCHC 31.7*  RDW 18.9*   ------------------------------------------------------------------------------------------------------------------  Chemistries   Recent Labs Lab 05/12/15 1230  NA 140  K 4.0  CL 106  CO2 25  GLUCOSE 114*  BUN 8  CREATININE 0.64  CALCIUM 9.5  MG 2.1   ------------------------------------------------------------------------------------------------------------------ estimated creatinine clearance is 105.1 mL/min (by C-G formula based on Cr of 0.64). ------------------------------------------------------------------------------------------------------------------ No results for input(s): TSH, T4TOTAL, T3FREE, THYROIDAB in the last 72 hours.  Invalid input(s): FREET3  Cardiac Enzymes  Recent Labs Lab 05/12/15 1230  TROPONINI <0.03   ------------------------------------------------------------------------------------------------------------------ Invalid input(s): POCBNP ---------------------------------------------------------------------------------------------------------------  RADIOLOGY: Ct Angio Head W/cm &/or Wo Cm  05/13/2015  CLINICAL DATA:  65 year old female with headache for 1 week. No known injury. Initial encounter. EXAM: CT ANGIOGRAPHY HEAD TECHNIQUE: Multidetector CT imaging of the head was performed using the standard protocol during bolus administration of intravenous contrast. Multiplanar CT image reconstructions and MIPs were obtained to evaluate the vascular anatomy. CONTRAST:  37mL OMNIPAQUE IOHEXOL 350 MG/ML SOLN COMPARISON:  Head CT without contrast 11/15/2014. FINDINGS: Large body habitus. CT HEAD Brain: Cerebral volume is normal. No midline shift, ventriculomegaly,  mass effect, evidence of mass lesion, intracranial hemorrhage or evidence of cortically based acute infarction. Gray-white matter differentiation is within normal limits throughout the brain. Calvarium and skull base: Incidental hyperostosis of the calvarium. No acute osseous abnormality identified. Paranasal sinuses: Small right maxillary sinus mucous retention cyst is stable. Other  paranasal sinuses are clear. Tympanic cavities are clear. Trace inferior right mastoid effusion appears to be new. Negative visualized nasopharynx. Left mastoids remain clear. Orbits: Visualized orbits and scalp soft tissues are within normal limits. CTA HEAD Posterior circulation: Visualized distal vertebral arteries are patent, the right is dominant an the left appears diminutive beyond the left PICA origin. Patent basilar artery without stenosis. SCA origins are normal. Fetal type bilateral PCA origins. Bilateral PCA branches are normal. Anterior circulation: Visualized distal cervical ICAs are within normal limits. Both ICA siphons are patent. Minimal calcified siphon atherosclerosis on the right. No siphon stenosis. Normal ophthalmic and posterior communicating artery origins. Patent carotid termini. Normal MCA and ACA origins. Anterior communicating artery and bilateral ACA branches are within normal limits. Left MCA M1 segment, bifurcation, and left MCA branches are within normal limits. Right MCA M1 origin, bifurcation, and right MCA branches are within normal limits. Venous sinuses: Patent. Anatomic variants: Dominant distal right vertebral artery, fetal type bilateral PCA origins. Delayed phase:No abnormal enhancement identified. IMPRESSION: 1. Negative intracranial CTA. 2.  Normal CT appearance of the brain. 3. Trace right mastoid effusion is new since September, but significance is doubtful. Small right maxillary sinus mucous retention cyst is stable. Electronically Signed   By: Genevie Ann M.D.   On: 05/13/2015 13:01    EKG:   Orders placed or performed during the hospital encounter of 05/12/15  . EKG 12-Lead  . EKG 12-Lead  . ED EKG within 10 minutes  . ED EKG within 10 minutes    ASSESSMENT AND PLAN:  Principal Problem:   COPD exacerbation ([place])  #1 COPD exacerbation, no significant improvement on current medications and I suspect vocal cord dysfunction, continue current management, patient is on her usual doses of oxygen at present, may be able to discharge home tomorrow #2. Acute bronchitis, continue Zithromax , unable to get sputum cultures, dry cough #3. Headache, likely cough related,  sedimentation rate was 33, unremarkable, CTA of head was normal, headache is subsiding  #4. Hyperglycemia, hemoglobin A1c5.5. No diabetes. #5. Chest pain, unclear etiology, most likely musculoskeletal due to cough, get cardiac enzymes 3. Unable to use beta blockers due to bronchospasm, continue heparin subcutaneously , get V/Q  6. Gastroesophageal reflux, patient requests gastroenterologist evaluation, continue Protonix, add Carafate  Management plans discussed with the patient, family and they are in agreement.   DRUG ALLERGIES: No Known Allergies  CODE STATUS:     Code Status Orders        Start     Ordered   05/12/15 1805  Full code   Continuous     05/12/15 1804    Code Status History    Date Active Date Inactive Code Status Order ID Comments User Context   04/12/2015  5:08 PM 04/15/2015  6:04 PM Full Code [id number]  Lytle Butte, MD ED   01/28/2015  8:08 AM 02/08/2015  2:34 PM Full Code [id number]  Loletha Grayer, MD ED   11/13/2014  1:02 PM 11/19/2014  4:39 PM Full Code [id number]  Theodoro Grist, MD Inpatient   11/04/2014  5:50 PM 11/08/2014  4:37 PM Full Code [id number]  Vaughan Basta, MD Inpatient      TOTAL TIME TAKING CARE OF THIS PATIENT: 45 minutes.  Prolonged discussion for about 15 minutes about patient's symptoms, symptom management, emotional support was provided  [facility] M.D on  05/14/2015 at 2:07 PM  Between 7am to 6pm - Pager - [phone]  After 6pm go to [email or web address] - password  [facility]  [facility]  Office  [phone]  CC: Primary care physician; Keith Rake, MD

## 2015-05-14 NOTE — Progress Notes (Signed)
Date: 05/14/2015,   MRN# [id number] Loretta Wade [birth year] Code Status:     Code Status Orders        Start     Ordered   05/12/15 1805  Full code   Continuous     05/12/15 1804    Code Status History    Date Active Date Inactive Code Status Order ID Comments User Context   04/12/2015  5:08 PM 04/15/2015  6:04 PM Full Code [id number]  Wyatt Haste, MD ED   01/28/2015  8:08 AM 02/08/2015  2:34 PM Full Code [id number]  Alford Highland, MD ED   11/13/2014  1:02 PM 11/19/2014  4:39 PM Full Code [id number]  Katharina Caper, MD Inpatient   11/04/2014  5:50 PM 11/08/2014  4:37 PM Full Code [id number]  Altamese Dilling, MD Inpatient     Hosp day:@LENGTHOFSTAYDAYS @ Referring MD: @ATDPROV @         AdmissionWeight: (!) 322 lb (146.058 kg)                 CurrentWeight: (!) 322 lb (146.058 kg)  CC: Shortness of breath  HPI: This is a pleasant 65 yr old obese african american lady back here with increase shortness of breath, wheezing and cough. Due refractory symptoms after nebs in the ER she was admitted to the me-surgical floor. When I saw her she was in bed, mild wheezing, able to speak in full sentences. She denied gerd, post nasal drainage or acute exposure to noxious inhalants.  She is also has the history of sleep apnea.   PMHX:   Past Medical History  Diagnosis Date  . OA (osteoarthritis)   . Fibromyalgia   . Anemia   . Sleep apnea   . Fatigue   . Edema   . Esophageal reflux   . Nocturia   . Hypertension   . COPD (chronic obstructive pulmonary disease) (HCC)    Surgical Hx:  Past Surgical History  Procedure Laterality Date  . Cholecystectomy    . Total knee arthroplasty      right  . Vesicovaginal fistula closure w/ tah    . Abdominal hysterectomy     Family Hx:  Family History  Problem Relation Age of Onset  . Hypertension Mother   . Diabetes    . Breast cancer Daughter    Social Hx:   Social History  Substance Use Topics  . Smoking status: Former Smoker     Quit date: 11/04/1983  . Smokeless tobacco: None  . Alcohol Use: No   Medication:    Home Medication:  Current Outpatient Rx  Name  Route  Sig  Dispense  Refill  . azithromycin (ZITHROMAX Z-PAK) 250 MG tablet      Take 2 tablets (500 mg) on  Day 1,  followed by 1 tablet (250 mg) once daily on Days 2 through 5.   6 each   0   . predniSONE (DELTASONE) 20 MG tablet   Oral   Take 2 tablets (40 mg total) by mouth daily.   8 tablet   0     Current Medication: @CURMEDTAB @   Allergies:  Review of patient's allergies indicates no known allergies.  Review of Systems: Gen:  Denies  fever, sweats, chills HEENT: Denies blurred vision, double vision, ear pain, eye pain, hearing loss, nose bleeds, sore throat Cvc:  No dizziness, chest pain or heaviness Resp:   Shortness of breath, mild cough, no hemoptysis, wheezing Gi: Denies swallowing difficulty, stomach pain, nausea or  vomiting, diarrhea, constipation, bowel incontinence Gu:  Denies bladder incontinence, burning urine Ext:   No Joint pain, stiffness or swelling Skin: No skin rash, easy bruising or bleeding or hives Endoc:  No polyuria, polydipsia , polyphagia or weight change Psych: No depression, insomnia or hallucinations  Other:  All other systems negative  Physical Examination:   VS: BP 108/57 mmHg  Pulse 65  Temp(Src) 98.4 F (36.9 C) (Oral)  Resp 18  Ht 5\' 4"  (1.626 m)  Wt 322 lb (146.058 kg)  BMI 55.24 kg/m2  SpO2 98%  General Appearance: No distress, in bed, obese  Neuro/psych without focal findings, mental status, speech normal, alert and oriented, cranial nerves 2-12 intact, reflexes normal and symmetric, sensation grossly normal  HEENT: PERRLA, EOM intact, no ptosis, no other lesions noticed NECK: Thick and short, no stridor  Pulmonary:.No wheezing, No rales  Sputum Production:   Cardiovascular:  Normal S1,S2.  No m/r/g.  .    Abdomen: Obese, Soft, non-tender, No masses, hepatosplenomegaly, No  lymphadenopathy Endoc: No evident thyromegaly, no signs of acromegaly or Cushing features Skin:   warm, no rashes, no ecchymosis  Extremities: normal, no cyanosis, clubbing, trace edema, warm with normal capillary refill.    Labs results:   Recent Labs     05/12/15  1230  HGB  12.6  HCT  39.9  MCV  91.5  WBC  7.0  BUN  8  CREATININE  0.64  GLUCOSE  114*  CALCIUM  9.5  ,    Rad results:   Ct Angio Head W/cm &/or Wo Cm  05/13/2015  CLINICAL DATA:  65 year old female with headache for 1 week. No known injury. Initial encounter. EXAM: CT ANGIOGRAPHY HEAD TECHNIQUE: Multidetector CT imaging of the head was performed using the standard protocol during bolus administration of intravenous contrast. Multiplanar CT image reconstructions and MIPs were obtained to evaluate the vascular anatomy. CONTRAST:  80mL OMNIPAQUE IOHEXOL 350 MG/ML SOLN COMPARISON:  Head CT without contrast 11/15/2014. FINDINGS: Large body habitus. CT HEAD Brain: Cerebral volume is normal. No midline shift, ventriculomegaly, mass effect, evidence of mass lesion, intracranial hemorrhage or evidence of cortically based acute infarction. Gray-white matter differentiation is within normal limits throughout the brain. Calvarium and skull base: Incidental hyperostosis of the calvarium. No acute osseous abnormality identified. Paranasal sinuses: Small right maxillary sinus mucous retention cyst is stable. Other paranasal sinuses are clear. Tympanic cavities are clear. Trace inferior right mastoid effusion appears to be new. Negative visualized nasopharynx. Left mastoids remain clear. Orbits: Visualized orbits and scalp soft tissues are within normal limits. CTA HEAD Posterior circulation: Visualized distal vertebral arteries are patent, the right is dominant an the left appears diminutive beyond the left PICA origin. Patent basilar artery without stenosis. SCA origins are normal. Fetal type bilateral PCA origins. Bilateral PCA branches  are normal. Anterior circulation: Visualized distal cervical ICAs are within normal limits. Both ICA siphons are patent. Minimal calcified siphon atherosclerosis on the right. No siphon stenosis. Normal ophthalmic and posterior communicating artery origins. Patent carotid termini. Normal MCA and ACA origins. Anterior communicating artery and bilateral ACA branches are within normal limits. Left MCA M1 segment, bifurcation, and left MCA branches are within normal limits. Right MCA M1 origin, bifurcation, and right MCA branches are within normal limits. Venous sinuses: Patent. Anatomic variants: Dominant distal right vertebral artery, fetal type bilateral PCA origins. Delayed phase:No abnormal enhancement identified. IMPRESSION: 1. Negative intracranial CTA. 2.  Normal CT appearance of the brain. 3. Trace right mastoid effusion  is new since September, but significance is doubtful. Small right maxillary sinus mucous retention cyst is stable. Electronically Signed   By: [name] Makira Holleman M.D.   On: 05/13/2015 13:01     CLINICAL DATA: Central chest pain this morning with history of COPD  EXAM: CHEST 2 VIEW  COMPARISON: 04/12/2015  FINDINGS: Limited inspiratory effect. Mild to moderate cardiac silhouette enlargement stable. Vascular pattern normal and lungs clear allowing for limited inspiration. Lateral radiograph significantly limited by body habitus.  IMPRESSION: Study limited by body habitus and limited inspiratory effect. Stable cardiac enlargement with no acute findings.  Assessment and Plan: Her presentation is copd exacerbation, improving since being here. ? Compliance. There may also be a component of anxiety -oxygen  -solumedrol may be able to transition to prednisone taper x 10-12 days -laba/ics, anticholinergic, albuterol -out patient f/u with pfts -need to loose weight -dvt prophylaxis -following       I have personally obtained a history, examined the patient, evaluated  laboratory and imaging results, formulated the assessment and plan and placed orders.  The Patient requires high complexity decision making for assessment and support, frequent evaluation and titration of therapies, application of advanced monitoring technologies and extensive interpretation of multiple databases.   Jeslie Lowe,M.D. Pulmonary & Critical care Medicine [facility]

## 2015-05-15 MED ORDER — IPRATROPIUM-ALBUTEROL 0.5-2.5 (3) MG/3ML IN SOLN
3.0000 mL | RESPIRATORY_TRACT | Status: DC | PRN
  Administered 2015-05-15: 3 mL via RESPIRATORY_TRACT
  Filled 2015-05-15: qty 3

## 2015-05-15 NOTE — Progress Notes (Signed)
[facility] [place] at [facility]   PATIENT NAME: Loretta Wade    MR#:  [id number]  DATE OF BIRTH:  1950/05/09  SUBJECTIVE:  States breathing improved however still not at baseline She does wear 2 L oxygen continuously at home  REVIEW OF SYSTEMS:  CONSTITUTIONAL: No fever, fatigue or weakness.  EYES: No blurred or double vision.  EARS, NOSE, AND THROAT: No tinnitus or ear pain.  RESPIRATORY: No cough, positive shortness of breath, wheezing denies hemoptysis.  CARDIOVASCULAR: No chest pain, orthopnea, edema.  GASTROINTESTINAL: No nausea, vomiting, diarrhea or abdominal pain.  GENITOURINARY: No dysuria, hematuria.  ENDOCRINE: No polyuria, nocturia,  HEMATOLOGY: No anemia, easy bruising or bleeding SKIN: No rash or lesion. MUSCULOSKELETAL: No joint pain or arthritis.   NEUROLOGIC: No tingling, numbness, weakness.  PSYCHIATRY: No anxiety or depression.   DRUG ALLERGIES:  No Known Allergies  VITALS:  Blood pressure 140/66, pulse 78, temperature 98.5 F (36.9 C), temperature source Oral, resp. rate 18, height 5\' 4"  (1.626 m), weight 146.058 kg (322 lb), SpO2 97 %.  PHYSICAL EXAMINATION:  VITAL SIGNS: Filed Vitals:   05/15/15 0612 05/15/15 1000  BP: 126/60 140/66  Pulse: 51 78  Temp: 97.7 F (36.5 C) 98.5 F (36.9 C)  Resp: 16 18   GENERAL:64 y.o.female currently in no acute distress.  HEAD: Normocephalic, atraumatic.  EYES: Pupils equal, round, reactive to light. Extraocular muscles intact. No scleral icterus.  MOUTH: Moist mucosal membrane. Dentition intact. No abscess noted.  EAR, NOSE, THROAT: Clear without exudates. No external lesions.  NECK: Supple. No thyromegaly. No nodules. No JVD.  PULMONARY:  expiratory wheeze without rails or rhonci. No use of accessory muscles, Good respiratory effort. good air entry bilaterally CHEST: Nontender to palpation.  CARDIOVASCULAR: S1 and S2. Regular rate and rhythm. No murmurs, rubs, or gallops. No  edema. Pedal pulses 2+ bilaterally.  GASTROINTESTINAL: Soft, nontender, nondistended. No masses. Positive bowel sounds. No hepatosplenomegaly.  MUSCULOSKELETAL: No swelling, clubbing, or edema. Range of motion full in all extremities.  NEUROLOGIC: Cranial nerves II through XII are intact. No gross focal neurological deficits. Sensation intact. Reflexes intact.  SKIN: No ulceration, lesions, rashes, or cyanosis. Skin warm and dry. Turgor intact.  PSYCHIATRIC: Mood, affect within normal limits. The patient is awake, alert and oriented x 3. Insight, judgment intact.      LABORATORY PANEL:   CBC  Recent Labs Lab 05/12/15 1230  WBC 7.0  HGB 12.6  HCT 39.9  PLT 217   ------------------------------------------------------------------------------------------------------------------  Chemistries   Recent Labs Lab 05/12/15 1230  NA 140  K 4.0  CL 106  CO2 25  GLUCOSE 114*  BUN 8  CREATININE 0.64  CALCIUM 9.5  MG 2.1   ------------------------------------------------------------------------------------------------------------------  Cardiac Enzymes  Recent Labs Lab 05/14/15 2246  TROPONINI <0.03   ------------------------------------------------------------------------------------------------------------------  RADIOLOGY:  Ct Angio Head W/cm &/or Wo Cm  05/13/2015  CLINICAL DATA:  64 year old female with headache for 1 week. No known injury. Initial encounter. EXAM: CT ANGIOGRAPHY HEAD TECHNIQUE: Multidetector CT imaging of the head was performed using the standard protocol during bolus administration of intravenous contrast. Multiplanar CT image reconstructions and MIPs were obtained to evaluate the vascular anatomy. CONTRAST:  80mL OMNIPAQUE IOHEXOL 350 MG/ML SOLN COMPARISON:  Head CT without contrast 11/15/2014. FINDINGS: Large body habitus. CT HEAD Brain: Cerebral volume is normal. No midline shift, ventriculomegaly, mass effect, evidence of mass lesion, intracranial  hemorrhage or evidence of cortically based acute infarction. Gray-white matter differentiation is within normal  limits throughout the brain. Calvarium and skull base: Incidental hyperostosis of the calvarium. No acute osseous abnormality identified. Paranasal sinuses: Small right maxillary sinus mucous retention cyst is stable. Other paranasal sinuses are clear. Tympanic cavities are clear. Trace inferior right mastoid effusion appears to be new. Negative visualized nasopharynx. Left mastoids remain clear. Orbits: Visualized orbits and scalp soft tissues are within normal limits. CTA HEAD Posterior circulation: Visualized distal vertebral arteries are patent, the right is dominant an the left appears diminutive beyond the left PICA origin. Patent basilar artery without stenosis. SCA origins are normal. Fetal type bilateral PCA origins. Bilateral PCA branches are normal. Anterior circulation: Visualized distal cervical ICAs are within normal limits. Both ICA siphons are patent. Minimal calcified siphon atherosclerosis on the right. No siphon stenosis. Normal ophthalmic and posterior communicating artery origins. Patent carotid termini. Normal MCA and ACA origins. Anterior communicating artery and bilateral ACA branches are within normal limits. Left MCA M1 segment, bifurcation, and left MCA branches are within normal limits. Right MCA M1 origin, bifurcation, and right MCA branches are within normal limits. Venous sinuses: Patent. Anatomic variants: Dominant distal right vertebral artery, fetal type bilateral PCA origins. Delayed phase:No abnormal enhancement identified. IMPRESSION: 1. Negative intracranial CTA. 2.  Normal CT appearance of the brain. 3. Trace right mastoid effusion is new since September, but significance is doubtful. Small right maxillary sinus mucous retention cyst is stable. Electronically Signed   By: [name] Fleming M.D.   On: 05/13/2015 13:01   Nm Pulmonary Perf And Vent  05/14/2015  CLINICAL DATA:   Shortness of breath for 1 week. EXAM: NUCLEAR MEDICINE VENTILATION - PERFUSION LUNG SCAN TECHNIQUE: Ventilation images were obtained in multiple projections using inhaled aerosol Tc-5m DTPA. Perfusion images were obtained in multiple projections after intravenous injection of Tc-61m MAA. RADIOPHARMACEUTICALS:  32.2 Technetium-28m DTPA aerosol inhalation and 4.1 Technetium-76m MAA IV COMPARISON:  Chest x-ray 05/12/2015 FINDINGS: Ventilation: No focal ventilation defect. Perfusion: No wedge shaped peripheral perfusion defects to suggest acute pulmonary embolism. IMPRESSION: Normal bilateral lung perfusion. Electronically Signed   By: Kennith Center M.D.   On: 05/14/2015 18:14    EKG:   Orders placed or performed during the hospital encounter of 05/12/15  . EKG 12-Lead  . EKG 12-Lead  . ED EKG within 10 minutes  . ED EKG within 10 minutes    ASSESSMENT AND PLAN:   65 year old female admitted 05/12/2015 shortness of breath  1.Chronic obstructive pulmonary disease exacerbation: Provide DuoNeb treatments q. 4 hours, Solu-Medrol 60 mg IV q. daily, azithromycin. Continue with home medications. Oxygen 2. GERD without esophagitis: PPI therapy-GI consult pending from prior physician 3. Essential hypertension Lasix 4. Venous thromboembolism prophylactic: Heparin     All the records are reviewed and case discussed with Care Management/Social Workerr. Management plans discussed with the patient, family and they are in agreement.  CODE STATUS: Full  TOTAL TIME TAKING CARE OF THIS PATIENT: 28 minutes.   POSSIBLE D/C IN 1 DAYS, DEPENDING ON CLINICAL CONDITION.   Gearldean Lomanto,  Mardi Mainland.D on 05/15/2015 at 12:34 PM  Between 7am to 6pm - Pager - [phone]  After 6pm: House Pager: - [phone]  [facility]  Office  [phone]  CC: Primary care physician; Brayton El, MD

## 2015-05-15 NOTE — Progress Notes (Signed)
GI Note:  Full note to follow.   EGD would not change management at this point and would be high risk given her ongoing respiratory issues and morbid obesity.  Recs: - BID PPI - can f/u in GI clinic to consider high risk endoscopy.  - wt loss.

## 2015-05-15 NOTE — Consult Note (Signed)
GI Inpatient Consult Note  Reason for Consult: GERD, SOB   Attending Requesting Consult: [place]  History of Present Illness: Loretta Wade is a 65 y.o. female with PMHx notable for COPD, OSA, obesity admitted with SOB, chest pain attribute dto likely COPD exaxc.  She also reports long standing GERD and question if GERD is contributing to respiratory problems.  She reports taking prilosec at home which does not help much.  Repors EGD in about 2014, thinks was some inflammation of esophagus but report is not available.  No dysphagia.  No blood in stools, melena, n/v.   Denies nsaids, ETOH.   In hosp, being treate for COPD exac with steroids, azithromycin, nebs.  02 improving but not yet back to baseline.   Has also been started on BID PPI and carafate. She has not noticed much difference so far from those two meds.   Past Medical History:  Past Medical History  Diagnosis Date  . OA (osteoarthritis)   . Fibromyalgia   . Anemia   . Sleep apnea   . Fatigue   . Edema   . Esophageal reflux   . Nocturia   . Hypertension   . COPD (chronic obstructive pulmonary disease) ([place])     Problem List: Patient Active Problem List   Diagnosis Date Noted  . COPD exacerbation ([place]) 04/12/2015  . Poor mobility 04/12/2015  . Fibromyalgia 03/23/2015  . Esophageal reflux 03/23/2015  . MRSA infection 11/17/2014  . Diastolic CHF, acute on chronic (HCC) 11/12/2014  . Morbid obesity with BMI of 50.0-59.9, adult ([place]) 11/12/2014  . Neck pain, chronic 11/12/2014  . COPD, frequent exacerbations ([place]) 11/04/2014    Past Surgical History: Past Surgical History  Procedure Laterality Date  . Cholecystectomy    . Total knee arthroplasty      right  . Vesicovaginal fistula closure w/ tah    . Abdominal hysterectomy      Allergies: No Known Allergies  Home Medications: Prescriptions prior to admission  Medication Sig Dispense Refill Last Dose  . acetaminophen (TYLENOL) 325 MG tablet Take 2  tablets (650 mg total) by mouth every 6 (six) hours as needed for mild pain (or Fever >/= 101). 30 tablet 0 Past Week at Unknown time  . albuterol (PROVENTIL HFA;VENTOLIN HFA) 108 (90 Base) MCG/ACT inhaler Inhale 2 puffs into the lungs every 6 (six) hours as needed for wheezing or shortness of breath.   05/11/2015 at Unknown time  . benzonatate (TESSALON) 200 MG capsule Take 1 capsule (200 mg total) by mouth 3 (three) times daily as needed for cough. 20 capsule 0 Past Week at Unknown time  . budesonide (PULMICORT) 0.5 MG/2ML nebulizer solution Take 0.5 mg by nebulization 2 (two) times daily.    05/11/2015 at Unknown time  . Chlorphen-Phenyleph-APAP (CORICIDIN D COLD/FLU/SINUS) 2-5-325 MG TABS Take 2 tablets by mouth daily as needed (for nasal congestion/cough).   Past Month at Unknown time  . cholecalciferol (VITAMIN D) 1000 UNITS tablet Take 1,000 Units by mouth daily.    05/11/2015 at Unknown time  . citalopram (CELEXA) 20 MG tablet Take 20 mg by mouth daily.   05/11/2015 at Unknown time  . colesevelam (WELCHOL) 625 MG tablet Take 625 mg by mouth 2 (two) times daily with a meal.   05/11/2015 at Unknown time  . dexlansoprazole (DEXILANT) 60 MG capsule Take 1 capsule (60 mg total) by mouth daily. 90 capsule 0 05/11/2015 at Unknown time  . fluticasone (FLONASE) 50 MCG/ACT nasal spray Place 2  sprays into both nostrils daily as needed for rhinitis.   Past Week at Unknown time  . Fluticasone Furoate-Vilanterol 100-25 MCG/INH AEPB Inhale 1 puff into the lungs 2 (two) times daily.    05/11/2015 at Unknown time  . furosemide (LASIX) 40 MG tablet Take 1 tablet (40 mg total) by mouth 2 (two) times daily. 30 tablet 60 05/11/2015 at Unknown time  . guaiFENesin-dextromethorphan (ROBITUSSIN DM) 100-10 MG/5ML syrup Take 5-10 mLs by mouth every 4 (four) hours as needed for cough.    Past Month at Unknown time  . HYDROcodone-acetaminophen (NORCO/VICODIN) 5-325 MG tablet Take 1 tablet by mouth every 8 (eight) hours as needed for  moderate pain. 90 tablet 0 05/12/2015 at 0400  . ipratropium-albuterol (DUONEB) 0.5-2.5 (3) MG/3ML SOLN Take 3 mLs by nebulization every 6 (six) hours as needed (for wheezing/shortness of breath).   05/11/2015 at Unknown time  . loperamide (IMODIUM) 2 MG capsule Take 2-4 mg by mouth as needed for diarrhea or loose stools.   Past Month at Unknown time  . Magnesium 250 MG TABS Take 250 mg by mouth daily.   05/11/2015 at Unknown time  . nystatin (MYCOSTATIN) 100000 UNIT/ML suspension Take 5 mLs by mouth 4 (four) times daily as needed (for thrush).    05/11/2015 at Unknown time  . oxybutynin (DITROPAN) 5 MG tablet Take 1 tablet (5 mg total) by mouth daily. 30 tablet 0 05/11/2015 at Unknown time  . pregabalin (LYRICA) 150 MG capsule Take 1 capsule (150 mg total) by mouth 2 (two) times daily. 60 capsule 0 05/11/2015 at Unknown time  . propranolol ER (INDERAL LA) 80 MG 24 hr capsule Take 80 mg by mouth daily.   05/11/2015 at [place]   . ranitidine (ZANTAC) 150 MG tablet Take 150 mg by mouth 3 (three) times daily as needed for heartburn.   Past Week at Unknown time  . rOPINIRole (REQUIP) 0.25 MG tablet Take 3 tablets (0.75 mg total) by mouth at bedtime. 90 tablet 2 05/11/2015 at Unknown time  . tiotropium (SPIRIVA) 18 MCG inhalation capsule Place 18 mcg into inhaler and inhale daily.   05/11/2015 at Unknown time  . morphine (MS CONTIN) 15 MG 12 hr tablet Take 1 tablet (15 mg total) by mouth 2 (two) times daily. Pt takes with a 30mg  tablet. (Patient not taking: Reported on 05/12/2015) 10 tablet 0   . morphine (MS CONTIN) 30 MG 12 hr tablet Take 1 tablet (30 mg total) by mouth 2 (two) times daily. Pt takes with a 15mg  tablet. (Patient not taking: Reported on 05/12/2015) 10 tablet 0   . moxifloxacin (AVELOX) 400 MG tablet Take 1 tablet (400 mg total) by mouth daily at 8 pm. (Patient not taking: Reported on 05/12/2015) 7 tablet 0    Home medication reconciliation was completed with the patient.   Scheduled Inpatient Medications:   .  azithromycin  250 mg Oral Daily  . chlorpheniramine-HYDROcodone  5 mL Oral Q12H  . citalopram  20 mg Oral Daily  . colesevelam  625 mg Oral BID WC  . fluticasone furoate-vilanterol  1 puff Inhalation BID  . furosemide  40 mg Oral BID  . guaiFENesin  600 mg Oral BID  . heparin  5,000 Units Subcutaneous 3 times per day  . magnesium oxide  400 mg Oral Daily  . methylPREDNISolone (SOLU-MEDROL) injection  60 mg Intravenous Q12H  . oxybutynin  5 mg Oral Daily  . pantoprazole  40 mg Oral Daily  . pregabalin  150 mg  Oral BID  . propranolol ER  80 mg Oral Daily  . rOPINIRole  0.75 mg Oral QHS  . sodium chloride flush  3 mL Intravenous Q12H  . sucralfate  1 g Oral TID WC & HS  . tiotropium  18 mcg Inhalation Daily    Continuous Inpatient Infusions:     PRN Inpatient Medications:  sodium chloride, acetaminophen **OR** acetaminophen, albuterol, benzonatate, fluticasone, guaiFENesin, HYDROcodone-acetaminophen, ipratropium-albuterol, loperamide, nystatin, ondansetron **OR** ondansetron (ZOFRAN) IV, pseudoephedrine, sodium chloride flush  Family History: family history includes Breast cancer in her daughter; Hypertension in her mother.  The patient's family history is negative for inflammatory bowel disorders, GI malignancy, or solid organ transplantation.  Social History:   reports that she quit smoking about 31 years ago. She does not have any smokeless tobacco history on file. She reports that she does not drink alcohol or use illicit drugs.   Review of Systems: Constitutional: Weight is stable.  Eyes: No changes in vision. ENT: No oral lesions, sore throat.  GI: see HPI.  Heme/Lymph: No easy bruising.  CV: +chest pain.  GU: No hematuria.  Integumentary: No rashes.  Neuro: No headaches.  Psych: +depression/anxiety.  Endocrine: No heat/cold intolerance.  Allergic/Immunologic: No urticaria.  Resp: + cough, + SOB.  Musculoskeletal: No joint swelling.    Physical Examination: BP  126/55 mmHg  Pulse 56  Temp(Src) 97.9 F (36.6 C) (Oral)  Resp 18  Ht 5\' 4"  (1.626 m)  Wt 146.058 kg (322 lb)  BMI 55.24 kg/m2  SpO2 96% Gen: NAD, alert and oriented x 4, + obese HEENT: PEERLA, EOMI, Neck: supple, no JVD or thyromegaly Chest: +decreased air entry bilaterally, bilaterally, no wheezes, crackles, or other adventitious sounds CV: RRR, no m/g/c/r Abd: soft, NT, ND, +BS in all four quadrants; no HSM, guarding, ridigity, or rebound tenderness Ext: no edema, well perfused with 2+ pulses, Skin: no rash or lesions noted Lymph: no LAD  Data: Lab Results  Component Value Date   WBC 7.0 05/12/2015   HGB 12.6 05/12/2015   HCT 39.9 05/12/2015   MCV 91.5 05/12/2015   PLT 217 05/12/2015    Recent Labs Lab 05/12/15 1230  HGB 12.6   Lab Results  Component Value Date   NA 140 05/12/2015   K 4.0 05/12/2015   CL 106 05/12/2015   CO2 25 05/12/2015   BUN 8 05/12/2015   CREATININE 0.64 05/12/2015   Lab Results  Component Value Date   ALT 13* 01/28/2015   AST 17 01/28/2015   ALKPHOS 77 01/28/2015   BILITOT 0.5 01/28/2015   No results for input(s): APTT, INR, PTT in the last 168 hours.   Assessment/Plan: Loretta Wade is a 65 y.o. female with severe obesity, aw SOB, likely COPD exax. Also with long standing GERD.  Agre with tral of BID PPI.  EGD would not change management at this point and would be high risk given her ongoing respiratory issues and morbid obesity. Doubt GERD is contributing to the respiratory issues and other than PPI, not anyting that could be done to help with reflux other than weight loss and lifestyle modification.   Recommenations: - cont BID PPI - can f/u in GI clinic to consider high risk endoscopy.  - wt loss, elev HOB, avoid trigger foods.    Thank you for the consult. Please call with questions or concerns.  Blayre Papania, Grace Blight, MD

## 2015-05-15 NOTE — Clinical Social Work Note (Addendum)
Clinical Social Work Assessment  Patient Details  Name: Loretta Wade MRN: [id number] Date of Birth: 21-Jul-1950  Date of referral:  05/15/15               Reason for consult:   (COPD Goal)                Permission sought to share information with:  Case Manager Permission granted to share information::  Yes, Verbal Permission Granted  Name::        Agency::     Relationship::     Contact Information:     Housing/Transportation Living arrangements for the past 2 months:  [place] of Information:  Patient Patient Interpreter Needed:  None Criminal Activity/Legal Involvement Pertinent to Current Situation/Hospitalization:  No - Comment as needed Significant Relationships:  Adult Children, Siblings (Loretta Wade (son) Loretta Wade (sister)) Lives with:  Self Do you feel safe going back to the place where you live?  Yes Need for family participation in patient care:  No (Coment)  Care giving concerns:  Reason for Consult: COPD Goal    Social Worker assessment / plan: Holiday representative (CSW) met with patient at bedside. Patient was alert and oriented, lying in bed. CSW introduced self and explained role of CSW department. Patient reported that she lives alone at an [facility] Patient is on 2L of Oxygen at home. DME [employer] is [employer]. Patient reports to having family support. CSW completed Anxiety screen due to patient admitting to having Anxiety. Patient scored a 15, which is moderate Anxiety. Patent denied Depression symptoms. CSW provided patient with information on Anxiety and Behavioral Health.   Employment status:  Disabled (Comment on whether or not currently receiving Disability) Insurance information:    PT Recommendations:  Home with [place] ([place]) Information / Referral to community resources:  Other (Comment Required) (PCS)  Patient/Family's Response to care: PT has recommend home with Home Health  PT  Patient/Family's Understanding of and Emotional Response to Diagnosis, Current Treatment, and Prognosis:  Patient is well aware of diagnosis, current treatment and prognosis. Patient has agreed to discharge plan to return home with Home health PT. Patient was open to receiving Information on Anxiety.    Emotional Assessment Appearance:  Appears stated age Attitude/Demeanor/Rapport:    Affect (typically observed):  Appropriate, Calm, Stable, Sad, Pleasant, Hopeless Orientation:  Oriented to Self, Oriented to Place, Oriented to  Time, Oriented to Situation Alcohol / Substance use:    Psych involvement (Current and /or in the community):  No (Comment)  Discharge Needs  Concerns to be addressed:  Coping/Stress Concerns Readmission within the last 30 days:    Current discharge risk:    Barriers to Discharge:  Continued Medical Work up   [facility]	[place], LCSW 05/15/2015, 3:08 PM

## 2015-05-16 LAB — CBC
HEMATOCRIT: 37 % (ref 35.0–47.0)
HEMOGLOBIN: 12.1 g/dL (ref 12.0–16.0)
MCH: 28.9 pg (ref 26.0–34.0)
MCHC: 32.7 g/dL (ref 32.0–36.0)
MCV: 88.3 fL (ref 80.0–100.0)
Platelets: 242 10*3/uL (ref 150–440)
RBC: 4.2 MIL/uL (ref 3.80–5.20)
RDW: 18.7 % — ABNORMAL HIGH (ref 11.5–14.5)
WBC: 10.2 10*3/uL (ref 3.6–11.0)

## 2015-05-16 MED ORDER — AZITHROMYCIN 250 MG PO TABS: ORAL_TABLET | ORAL | Status: DC

## 2015-05-16 MED ORDER — PREDNISONE 20 MG PO TABS: ORAL_TABLET | ORAL | Status: DC

## 2015-05-16 MED ORDER — PANTOPRAZOLE SODIUM 40 MG PO TBEC
40.0000 mg | DELAYED_RELEASE_TABLET | Freq: Two times a day (BID) | ORAL | Status: DC
  Administered 2015-05-16: 40 mg via ORAL
  Filled 2015-05-16: qty 1

## 2015-05-16 NOTE — Progress Notes (Signed)
MD order to discharge patient home.  Inquired with Dr. Lavetta Nielsen about prescriptions and he states that the only new prescriptions are for azithromycin and prednisone.  Discharge instructions, prescriptions and instructions for follow up appointments were reviewed. Questions were encouraged but there were none.  Non emergent transport has been arranged with [facility].

## 2015-05-16 NOTE — Discharge Summary (Signed)
[place] [facility] [place] NAME: Loretta Wade    MR#:  [id number]  DATE OF BIRTH:  11-18-1950  DATE OF ADMISSION:  05/12/2015 ADMITTING PHYSICIAN: Demetrios Loll, MD  DATE OF DISCHARGE: No discharge date for patient encounter.  PRIMARY CARE PHYSICIAN: Keith Rake, MD    ADMISSION DIAGNOSIS:  COPD exacerbation ([place]) [J44.1]  DISCHARGE DIAGNOSIS:  Principal Problem:   COPD exacerbation ([place])   SECONDARY DIAGNOSIS:   Past Medical History  Diagnosis Date  . OA (osteoarthritis)   . Fibromyalgia   . Anemia   . Sleep apnea   . Fatigue   . Edema   . Esophageal reflux   . Nocturia   . Hypertension   . COPD (chronic obstructive pulmonary disease) [facility])     HOSPITAL COURSE:  Loretta Wade  is a 65 y.o. female admitted 05/12/2015 with chief complaint chest pain and shortness of breath. She was continued on supplemental oxygen, IV steroids, antibiotics with slow but gradual improvement in symptoms. She also had an issue with gastroesophageal reflux without esophagitis in the hospital which is a known diagnosis for her gastroenterology consult on without further intervention on their standpoint. Evaluated by physical therapy who recommended home physical therapy to which the patient agrees  DISCHARGE CONDITIONS:   Stable  CONSULTS OBTAINED:  Treatment Team:  Erby Pian, MD Lytle Butte, MD Josefine Class, MD  DRUG ALLERGIES:  No Known Allergies  DISCHARGE MEDICATIONS:   Current Discharge Medication List    START taking these medications   Details  azithromycin (ZITHROMAX Z-PAK) 250 MG tablet Take 2 tablets (500 mg) on  Day 1,  followed by 1 tablet (250 mg) once daily on Days 2 through 5. Qty: 6 each, Refills: 0      CONTINUE these medications which have CHANGED   Details  predniSONE (DELTASONE) 20 MG tablet 40mg  oral daily, then 20mg  oral daily x2, then 10mg  oral daily x2, then stop Qty: 10 tablet, Refills: 0       CONTINUE these medications which have NOT CHANGED   Details  acetaminophen (TYLENOL) 325 MG tablet Take 2 tablets (650 mg total) by mouth every 6 (six) hours as needed for mild pain (or Fever >/= 101). Qty: 30 tablet, Refills: 0    albuterol (PROVENTIL HFA;VENTOLIN HFA) 108 (90 Base) MCG/ACT inhaler Inhale 2 puffs into the lungs every 6 (six) hours as needed for wheezing or shortness of breath.    benzonatate (TESSALON) 200 MG capsule Take 1 capsule (200 mg total) by mouth 3 (three) times daily as needed for cough. Qty: 20 capsule, Refills: 0   Associated Diagnoses: COPD, frequent exacerbations (HCC)    budesonide (PULMICORT) 0.5 MG/2ML nebulizer solution Take 0.5 mg by nebulization 2 (two) times daily.     Chlorphen-Phenyleph-APAP (CORICIDIN D COLD/FLU/SINUS) 2-5-325 MG TABS Take 2 tablets by mouth daily as needed (for nasal congestion/cough).    cholecalciferol (VITAMIN D) 1000 UNITS tablet Take 1,000 Units by mouth daily.     citalopram (CELEXA) 20 MG tablet Take 20 mg by mouth daily.    colesevelam (WELCHOL) 625 MG tablet Take 625 mg by mouth 2 (two) times daily with a meal.    dexlansoprazole (DEXILANT) 60 MG capsule Take 1 capsule (60 mg total) by mouth daily. Qty: 90 capsule, Refills: 0   Associated Diagnoses: Gastroesophageal reflux disease, esophagitis presence not specified    fluticasone (FLONASE) 50 MCG/ACT nasal spray Place 2 sprays into both nostrils daily  as needed for rhinitis.    Fluticasone Furoate-Vilanterol 100-25 MCG/INH AEPB Inhale 1 puff into the lungs 2 (two) times daily.     furosemide (LASIX) 40 MG tablet Take 1 tablet (40 mg total) by mouth 2 (two) times daily. Qty: 30 tablet, Refills: 60    guaiFENesin-dextromethorphan (ROBITUSSIN DM) 100-10 MG/5ML syrup Take 5-10 mLs by mouth every 4 (four) hours as needed for cough.     HYDROcodone-acetaminophen (NORCO/VICODIN) 5-325 MG tablet Take 1 tablet by mouth every 8 (eight) hours as needed for moderate  pain. Qty: 90 tablet, Refills: 0    ipratropium-albuterol (DUONEB) 0.5-2.5 (3) MG/3ML SOLN Take 3 mLs by nebulization every 6 (six) hours as needed (for wheezing/shortness of breath).    loperamide (IMODIUM) 2 MG capsule Take 2-4 mg by mouth as needed for diarrhea or loose stools.    Magnesium 250 MG TABS Take 250 mg by mouth daily.    nystatin (MYCOSTATIN) 100000 UNIT/ML suspension Take 5 mLs by mouth 4 (four) times daily as needed (for thrush).     oxybutynin (DITROPAN) 5 MG tablet Take 1 tablet (5 mg total) by mouth daily. Qty: 30 tablet, Refills: 0    pregabalin (LYRICA) 150 MG capsule Take 1 capsule (150 mg total) by mouth 2 (two) times daily. Qty: 60 capsule, Refills: 0   Associated Diagnoses: Fibromyalgia    propranolol ER (INDERAL LA) 80 MG 24 hr capsule Take 80 mg by mouth daily.    ranitidine (ZANTAC) 150 MG tablet Take 150 mg by mouth 3 (three) times daily as needed for heartburn.    rOPINIRole (REQUIP) 0.25 MG tablet Take 3 tablets (0.75 mg total) by mouth at bedtime. Qty: 90 tablet, Refills: 2   Associated Diagnoses: RLS (restless legs syndrome)    tiotropium (SPIRIVA) 18 MCG inhalation capsule Place 18 mcg into inhaler and inhale daily.      STOP taking these medications     morphine (MS CONTIN) 15 MG 12 hr tablet      morphine (MS CONTIN) 30 MG 12 hr tablet      moxifloxacin (AVELOX) 400 MG tablet          DISCHARGE INSTRUCTIONS:    DIET:  Cardiac diet  DISCHARGE CONDITION:  Stable  ACTIVITY:  Activity as tolerated  OXYGEN:  Home Oxygen: Yes.     Oxygen Delivery: 2 liters/min via Patient connected to nasal cannula oxygen-patient already on home oxygen  DISCHARGE LOCATION:  home   If you experience worsening of your admission symptoms, develop shortness of breath, life threatening emergency, suicidal or homicidal thoughts you must seek medical attention immediately by calling 911 or calling your MD immediately  if symptoms less severe.  You  Must read complete instructions/literature along with all the possible adverse reactions/side effects for all the Medicines you take and that have been prescribed to you. Take any new Medicines after you have completely understood and accpet all the possible adverse reactions/side effects.   Please note  You were cared for by a hospitalist during your hospital stay. If you have any questions about your discharge medications or the care you received while you were in the hospital after you are discharged, you can call the unit and asked to speak with the hospitalist on call if the hospitalist that took care of you is not available. Once you are discharged, your primary care physician will handle any further medical issues. Please note that NO REFILLS for any discharge medications will be authorized once you are discharged,  as it is imperative that you return to your primary care physician (or establish a relationship with a primary care physician if you do not have one) for your aftercare needs so that they can reassess your need for medications and monitor your lab values.    On the day of Discharge:   VITAL SIGNS:  Blood pressure 124/60, pulse 64, temperature 98 F (36.7 C), temperature source Oral, resp. rate 20, height 5\' 4"  (1.626 m), weight 146.058 kg (322 lb), SpO2 96 %.  I/O:   Intake/Output Summary (Last 24 hours) at 05/16/15 1112 Last data filed at 05/16/15 0915  Gross per 24 hour  Intake    240 ml  Output   1750 ml  Net  -1510 ml    PHYSICAL EXAMINATION:  GENERAL:  65 y.o.-year-old patient lying in the bed with no acute distress.  EYES: Pupils equal, round, reactive to light and accommodation. No scleral icterus. Extraocular muscles intact.  HEENT: Head atraumatic, normocephalic. Oropharynx and nasopharynx clear.  NECK:  Supple, no jugular venous distention. No thyroid enlargement, no tenderness.  LUNGS: Normal breath sounds bilaterally, no wheezing, rales,rhonchi or  crepitation. No use of accessory muscles of respiration.  CARDIOVASCULAR: S1, S2 normal. No murmurs, rubs, or gallops.  ABDOMEN: Soft, non-tender, non-distended. Bowel sounds present. No organomegaly or mass.  EXTREMITIES: No pedal edema, cyanosis, or clubbing.  NEUROLOGIC: Cranial nerves II through XII are intact. Muscle strength 5/5 in all extremities. Sensation intact. Gait not checked.  PSYCHIATRIC: The patient is alert and oriented x 3.  SKIN: No obvious rash, lesion, or ulcer.   DATA REVIEW:   CBC  Recent Labs Lab 05/16/15 0607  WBC 10.2  HGB 12.1  HCT 37.0  PLT 242    Chemistries   Recent Labs Lab 05/12/15 1230  NA 140  K 4.0  CL 106  CO2 25  GLUCOSE 114*  BUN 8  CREATININE 0.64  CALCIUM 9.5  MG 2.1    Cardiac Enzymes  Recent Labs Lab 05/14/15 2246  TROPONINI <0.03    Microbiology Results  Results for orders placed or performed during the hospital encounter of 05/12/15  Rapid Influenza A&B Antigens ([place] only)     Status: None   Collection Time: 05/12/15  1:46 PM  Result Value Ref Range Status   Influenza A ([place]) NEGATIVE NEGATIVE Final   Influenza B ([facility]) NEGATIVE NEGATIVE Final    RADIOLOGY:  Nm Pulmonary Perf And Vent  05/14/2015  CLINICAL DATA:  Shortness of breath for 1 week. EXAM: NUCLEAR MEDICINE VENTILATION - PERFUSION LUNG SCAN TECHNIQUE: Ventilation images were obtained in multiple projections using inhaled aerosol Tc-74m DTPA. Perfusion images were obtained in multiple projections after intravenous injection of Tc-79m MAA. RADIOPHARMACEUTICALS:  32.2 Technetium-103m DTPA aerosol inhalation and 4.1 Technetium-93m MAA IV COMPARISON:  Chest x-ray 05/12/2015 FINDINGS: Ventilation: No focal ventilation defect. Perfusion: No wedge shaped peripheral perfusion defects to suggest acute pulmonary embolism. IMPRESSION: Normal bilateral lung perfusion. Electronically Signed   By: Misty Stanley M.D.   On: 05/14/2015 18:14     Management plans  discussed with the patient, family and they are in agreement.  CODE STATUS:     Code Status Orders        Start     Ordered   05/12/15 1805  Full code   Continuous     05/12/15 1804    Code Status History    Date Active Date Inactive Code Status Order ID Comments User Context   04/12/2015  5:08 PM 04/15/2015  6:04 PM Full Code [id number]  Lytle Butte, MD ED   01/28/2015  8:08 AM 02/08/2015  2:34 PM Full Code [id number]  Loletha Grayer, MD ED   11/13/2014  1:02 PM 11/19/2014  4:39 PM Full Code [id number]  Theodoro Grist, MD Inpatient   11/04/2014  5:50 PM 11/08/2014  4:37 PM Full Code [id number]  Vaughan Basta, MD Inpatient      TOTAL TIME TAKING CARE OF THIS PATIENT: 32 minutes.    Hower,  Karenann Cai.D on 05/16/2015 at 11:12 AM  Between 7am to 6pm - Pager - [phone]  After 6pm go to [email or web address] - password [email or web address] [facility]  [place] [facility]  Office  [phone]  CC: Primary care physician; Keith Rake, MD

## 2015-05-16 NOTE — Care Management Note (Signed)
Case Management Note  Patient Details  Name: Loretta Wade MRN: [id number] Date of Birth: Jun 11, 1950  Subjective/Objective:  Loretta Wade is currently a client of [facility]. A referral was faxed to [facility] requesting PT and Education officer, museum .                   Action/Plan:   Expected Discharge Date:                  Expected Discharge Plan:     In-House Referral:     Discharge planning Services     Post Acute Care Choice:    Choice offered to:     DME Arranged:    DME Agency:     HH Arranged:    [place] Agency:     Status of Service:     Medicare Important Message Given:    Date Medicare IM Given:    Medicare IM give by:    Date Additional Medicare IM Given:    Additional Medicare Important Message give by:     If discussed at [place] of Stay Meetings, dates discussed:    Additional Comments:  Zaevion Parke A, RN 05/16/2015, 11:18 AM

## 2015-05-17 ENCOUNTER — Telehealth: Payer: Self-pay | Admitting: Family Medicine

## 2015-05-17 NOTE — Telephone Encounter (Signed)
Loretta Wade from [employer] giving FYI:  Patient Discharged from [facility] on 05-16-15. [facility] will resume care along with physical therapy and social work

## 2015-05-18 NOTE — Telephone Encounter (Signed)
Routed to Dr. Shah °

## 2015-05-19 ENCOUNTER — Other Ambulatory Visit: Payer: Self-pay | Admitting: Family Medicine

## 2015-05-19 DIAGNOSIS — J441 Chronic obstructive pulmonary disease with (acute) exacerbation: Secondary | ICD-10-CM

## 2015-05-19 MED ORDER — FLUTICASONE FUROATE-VILANTEROL 100-25 MCG/INH IN AEPB: 1.0000 | INHALATION_SPRAY | Freq: Every day | RESPIRATORY_TRACT | Status: DC

## 2015-05-19 MED ORDER — TIOTROPIUM BROMIDE MONOHYDRATE 18 MCG IN CAPS: 18.0000 ug | ORAL_CAPSULE | Freq: Every day | RESPIRATORY_TRACT | Status: DC

## 2015-05-21 ENCOUNTER — Telehealth: Payer: Self-pay

## 2015-05-21 NOTE — Telephone Encounter (Signed)
Spoke with the [place] representative regarding patient's home oxygen. According to [place], patient must have oxygen desaturation test where her oxygen drops below 88% while resting on room air for her to be qualified for home oxygen. Explained that patient has been on home oxygen for a long time, has history of multiple COPD exacerbations but according to the representative, she must have an oxygen desaturation test in order for insurance to approve for the supplies. Then called and spoke with the patient regarding the oxygen desaturation test and she will try to contact [place] representative. Also asked to check for the paperwork regarding any additional supplies such as hospital bed and nebulizer. Patient verbalized agreement.

## 2015-05-21 NOTE — Telephone Encounter (Signed)
Routed to [name] for prescription for supplies for [employer]

## 2015-05-27 ENCOUNTER — Telehealth: Payer: Self-pay

## 2015-05-27 ENCOUNTER — Other Ambulatory Visit: Payer: Self-pay | Admitting: Family Medicine

## 2015-05-27 NOTE — Telephone Encounter (Signed)
Patient called back wants to discuss conversation she had with [name] about hospice?

## 2015-05-28 NOTE — Telephone Encounter (Signed)
Propranolol ER (inderal LA) 80 mg 24 hr capsule has been refilled and sent to [facility]

## 2015-05-28 NOTE — Telephone Encounter (Signed)
Routed to [name] for return call to patient

## 2015-05-28 NOTE — Telephone Encounter (Signed)
Returned call and discussed her concerns about being referred to hospice. Explained that the reason for referral is for worsening COPD, frequent exacerbations, hospital admissions and chronic pain. No one has contacted her from hospice and palliative care yet. She was reassured and will follow-up.

## 2015-05-31 ENCOUNTER — Telehealth: Payer: Self-pay | Admitting: Family Medicine

## 2015-05-31 NOTE — Telephone Encounter (Signed)
Dee from [facility] states that patient is requesting a new bariatric hospital bed and bariatric wheel chair. They have one on file but it has expired 2015. She is also requesting a portable oxygen concentrator and a nebulizer (p) [phone] (f) [phone]

## 2015-06-01 ENCOUNTER — Telehealth: Payer: Self-pay | Admitting: Family Medicine

## 2015-06-01 ENCOUNTER — Telehealth: Payer: Self-pay

## 2015-06-01 NOTE — Telephone Encounter (Signed)
Spoke with patient and she has been notified that all paperwork concerning palliative and hospice care has been faxed on 06/01/2015

## 2015-06-01 NOTE — Telephone Encounter (Signed)
Routed to [name] for hand written prescription

## 2015-06-01 NOTE — Telephone Encounter (Signed)
DR CALLED Friday AND THAT HE DID ALL THE PAPERWORK AND WAS SENDING HER BACK TO [facility] AND SHE CAN NOT GET THEM TO CALL HER BACK AND WANTS TO KNOW WHAT SHE NEEDS TO DO FOR SHE IS NOT GETTING ANY HELP AT THIS TIME.

## 2015-06-01 NOTE — Telephone Encounter (Addendum)
Plan patient was seen on 04/12/2015, we discussed hospital bed and wheelchair during that visit. Please have [employer] send paperwork regarding the supplies that are being requested and I can sign off on them.

## 2015-06-01 NOTE — Telephone Encounter (Signed)
Routed to Dr. Shah for approval 

## 2015-06-02 NOTE — Telephone Encounter (Signed)
Prescription is ready to be faxed 

## 2015-06-03 ENCOUNTER — Telehealth: Payer: Self-pay | Admitting: Family Medicine

## 2015-06-03 ENCOUNTER — Telehealth: Payer: Self-pay

## 2015-06-03 DIAGNOSIS — K219 Gastro-esophageal reflux disease without esophagitis: Secondary | ICD-10-CM

## 2015-06-03 MED ORDER — DEXLANSOPRAZOLE 60 MG PO CPDR: 60.0000 mg | DELAYED_RELEASE_CAPSULE | Freq: Every day | ORAL | Status: DC

## 2015-06-03 NOTE — Telephone Encounter (Signed)
Patient called needs to speak to you about Hospice

## 2015-06-03 NOTE — Telephone Encounter (Signed)
Patient requesting return call. Pharmacy denied the reflux medication Dexilant and is questioning why.

## 2015-06-03 NOTE — Telephone Encounter (Signed)
Returned call and left a voice message for patient 

## 2015-06-03 NOTE — Telephone Encounter (Signed)
Medication has been refilled and sent to [facility] 

## 2015-06-04 ENCOUNTER — Telehealth: Payer: Self-pay | Admitting: Family Medicine

## 2015-06-04 NOTE — Telephone Encounter (Signed)
Patient requesting return call today before dr Manuella Ghazi leave. States that he called her back yesterday but she missed the call.

## 2015-06-04 NOTE — Telephone Encounter (Signed)
Patient discussed referral to [facility] for assistance with activities of daily living (bathing, dressing etc.), medication management, and physical therapy. She was under the care of [facility] which finished on March 23. In the past, she has been under the care of [facility]. Please confirm with [facility] and a referral may be placed to help with the above-mentioned activities.

## 2015-06-07 ENCOUNTER — Telehealth: Payer: Self-pay | Admitting: Family Medicine

## 2015-06-07 NOTE — Telephone Encounter (Signed)
Pt would like a call back

## 2015-06-07 NOTE — Telephone Encounter (Signed)
Pt called again and would like for you to call Marco Collie with hospice to see if she may get back into their program. I explained to pt that just in case she said no that we would keep the referral to home care open and continue to process it. Also explained to pt that she may not receive a call back today due very busy schedule.

## 2015-06-07 NOTE — Telephone Encounter (Signed)
Routed to [name] for advice and return call to patient

## 2015-06-07 NOTE — Telephone Encounter (Signed)
Loretta Wade from [place] would like to know which home health service are you referral the patient to? Also, patient is needing letter to for apartment complex stating that she is disabled and is needing an apartment for disabilities. Please fax information to [place] at:  (F) [phone]  (P) [phone] Loretta Wade was informed that we have 48 hours to respond to all messages.

## 2015-06-08 ENCOUNTER — Telehealth: Payer: Self-pay | Admitting: Family Medicine

## 2015-06-08 NOTE — Telephone Encounter (Signed)
Pt would like a call back

## 2015-06-08 NOTE — Telephone Encounter (Signed)
Please inform Jeanmarie Plant that patient may need to be referred to occupational therapy to determine her functional capacity evaluation in order for disability determination. For home health services, please see my note from 06/04/2015

## 2015-06-08 NOTE — Telephone Encounter (Signed)
Routed to [name] for letter request

## 2015-06-08 NOTE — Telephone Encounter (Signed)
Explained to patient that according to the hospice staff, she does not fulfill the criteria for hospice. For pain control, she will be going to pain management, I have advised her to use her COPD medications regularly, which should keep her out of the hospital. Patient verbalized agreement. In addition, patient has contacted [facility] to find out if she can obtain her oxygen cylinder from them as according to the patient, her current oxygen supplier [employer] wants her to have 'the breathing test'by her doctor. Please contact [place] to discuss details of possible referral in order for patient to obtain her oxygen

## 2015-06-08 NOTE — Telephone Encounter (Signed)
Routed to [name] patient is requesting a call back

## 2015-06-08 NOTE — Telephone Encounter (Signed)
Returned call. Please see the documentation on the phone note from 06/07/2015

## 2015-06-10 ENCOUNTER — Ambulatory Visit: Payer: Medicare PPO | Attending: Pain Medicine | Admitting: Pain Medicine

## 2015-06-10 ENCOUNTER — Encounter: Payer: Self-pay | Admitting: Pain Medicine

## 2015-06-10 ENCOUNTER — Telehealth: Payer: Self-pay | Admitting: Family Medicine

## 2015-06-10 VITALS — BP 143/114 | HR 74 | Temp 98.6°F | Resp 20 | Ht 64.0 in | Wt 315.0 lb

## 2015-06-10 DIAGNOSIS — M940 Chondrocostal junction syndrome [Tietze]: Secondary | ICD-10-CM | POA: Insufficient documentation

## 2015-06-10 DIAGNOSIS — M1990 Unspecified osteoarthritis, unspecified site: Secondary | ICD-10-CM | POA: Insufficient documentation

## 2015-06-10 DIAGNOSIS — R531 Weakness: Secondary | ICD-10-CM | POA: Insufficient documentation

## 2015-06-10 DIAGNOSIS — M79671 Pain in right foot: Secondary | ICD-10-CM | POA: Diagnosis not present

## 2015-06-10 DIAGNOSIS — Z87891 Personal history of nicotine dependence: Secondary | ICD-10-CM | POA: Diagnosis not present

## 2015-06-10 DIAGNOSIS — F419 Anxiety disorder, unspecified: Secondary | ICD-10-CM | POA: Diagnosis not present

## 2015-06-10 DIAGNOSIS — Z79899 Other long term (current) drug therapy: Secondary | ICD-10-CM

## 2015-06-10 DIAGNOSIS — K219 Gastro-esophageal reflux disease without esophagitis: Secondary | ICD-10-CM | POA: Insufficient documentation

## 2015-06-10 DIAGNOSIS — M79673 Pain in unspecified foot: Secondary | ICD-10-CM

## 2015-06-10 DIAGNOSIS — M797 Fibromyalgia: Secondary | ICD-10-CM | POA: Insufficient documentation

## 2015-06-10 DIAGNOSIS — F1190 Opioid use, unspecified, uncomplicated: Secondary | ICD-10-CM

## 2015-06-10 DIAGNOSIS — M19012 Primary osteoarthritis, left shoulder: Secondary | ICD-10-CM | POA: Diagnosis not present

## 2015-06-10 DIAGNOSIS — R200 Anesthesia of skin: Secondary | ICD-10-CM | POA: Insufficient documentation

## 2015-06-10 DIAGNOSIS — G8929 Other chronic pain: Secondary | ICD-10-CM | POA: Diagnosis not present

## 2015-06-10 DIAGNOSIS — J441 Chronic obstructive pulmonary disease with (acute) exacerbation: Secondary | ICD-10-CM | POA: Diagnosis not present

## 2015-06-10 DIAGNOSIS — I5033 Acute on chronic diastolic (congestive) heart failure: Secondary | ICD-10-CM | POA: Insufficient documentation

## 2015-06-10 DIAGNOSIS — Z515 Encounter for palliative care: Secondary | ICD-10-CM | POA: Insufficient documentation

## 2015-06-10 DIAGNOSIS — M545 Low back pain: Secondary | ICD-10-CM | POA: Diagnosis not present

## 2015-06-10 DIAGNOSIS — M069 Rheumatoid arthritis, unspecified: Secondary | ICD-10-CM | POA: Insufficient documentation

## 2015-06-10 DIAGNOSIS — E119 Type 2 diabetes mellitus without complications: Secondary | ICD-10-CM | POA: Diagnosis not present

## 2015-06-10 DIAGNOSIS — Z6841 Body Mass Index (BMI) 40.0 and over, adult: Secondary | ICD-10-CM | POA: Diagnosis not present

## 2015-06-10 DIAGNOSIS — Z5181 Encounter for therapeutic drug level monitoring: Secondary | ICD-10-CM

## 2015-06-10 DIAGNOSIS — Z79891 Long term (current) use of opiate analgesic: Secondary | ICD-10-CM | POA: Diagnosis not present

## 2015-06-10 DIAGNOSIS — Z0189 Encounter for other specified special examinations: Secondary | ICD-10-CM

## 2015-06-10 DIAGNOSIS — M479 Spondylosis, unspecified: Secondary | ICD-10-CM

## 2015-06-10 DIAGNOSIS — F329 Major depressive disorder, single episode, unspecified: Secondary | ICD-10-CM | POA: Insufficient documentation

## 2015-06-10 DIAGNOSIS — R079 Chest pain, unspecified: Secondary | ICD-10-CM | POA: Diagnosis present

## 2015-06-10 DIAGNOSIS — M79672 Pain in left foot: Secondary | ICD-10-CM | POA: Diagnosis not present

## 2015-06-10 DIAGNOSIS — G4730 Sleep apnea, unspecified: Secondary | ICD-10-CM | POA: Diagnosis not present

## 2015-06-10 DIAGNOSIS — M25512 Pain in left shoulder: Secondary | ICD-10-CM | POA: Diagnosis not present

## 2015-06-10 NOTE — Progress Notes (Signed)
Patient's Name: Loretta Wade  Patient type: New  MRN: [id number]  Service setting: Ambulatory outpatient  DOB: 1950/08/28    DOS: 06/10/2015     Primary Reason(s) for Visit: Initial Patient Evaluation CC: Chest Pain   HPI  Loretta Wade is a 65 y.o. year old, female patient, who comes today for an initial evaluation. She has COPD, frequent exacerbations ([place]); Diastolic CHF, acute on chronic ([place]); Morbid obesity with BMI of 50.0-59.9, adult ([place]); Neck pain, chronic; MRSA infection; Fibromyalgia; Esophageal reflux; COPD exacerbation ([place]); Poor mobility; Chronic pain; Chronic low back pain (Location of Tertiary source of pain) (Bilateral) (R>L); Chronic shoulder pain (Location of Secondary source of pain) (Left); Costochondritis (Location of Primary Source of Pain); Long term current use of opiate analgesic; Long term prescription opiate use; Opiate use; Encounter for therapeutic drug level monitoring; Encounter for pain management planning; Chronic feet pain (Bilateral) (R>L); Osteoarthritis; and Osteoarthritis of shoulder (Left) on her problem list.. Her primarily concern today is the Chest Pain   Pain Assessment: Self-Reported Pain Score: 8 , clinically she looks like a 2-3/10. Reported level is inconsistent with clinical obrservations Pain Type: Chronic pain Pain Location: Chest Pain Orientation: Mid Pain Descriptors / Indicators: Pressure Pain Frequency: Constant  Onset and Duration: Gradual, Date of onset: 6-7 years ago and Present longer than 3 months Cause of pain: Secondary to COPD problems and osteoarthrosis. Severity: No change since onset, NAS-11 at its worse: 10/10, NAS-11 at its best: 7/10, NAS-11 now: 8/10 and NAS-11 on the average: 7-8/10 Timing: Night and Not influenced by the time of the day Aggravating Factors: Bending, Eating, Lifiting, Prolonged standing and Walking Alleviating Factors: Lying down, Medications, Resting, Sitting, Relaxation therapy, Warm showers or  baths and Walking Associated Problems: Day-time cramps, Night-time cramps, Depression, Fatigue, Inability to control bladder (urine), Numbness, Sadness, Sweating, Swelling, Weakness, Pain that wakes patient up and Pain that does not allow patient to sleep Quality of Pain: Cruel, Deep, Disabling, Exhausting, Fearful, Horrible, Stabbing, Throbbing and Uncomfortable Previous Examinations or Tests: CT scan, X-rays and Orthoperdic evaluation Previous Treatments: Morphine pump, Physical Therapy and Steroid treatments by mouth  The patient comes in today clinics today for the first time for chronic pain management evaluation.  Historic Controlled Substance Pharmacotherapy Review  Previously Prescribed Opioids: Morphine sulfate 100 mg/5 ML (200 mg/day) Currently Prescribed Analgesic: Hydrocodone/APAP 5/325 one tablet every 8 hours (15 mg/day) Medications: Patient brought medications to be checked, as requested MME/day: 15 mg/day Pharmacodynamics: Analgesic Effect: More than 50% Activity Facilitation: Medication(s) allow patient to sit, stand, walk, and do the basic ADLs Perceived Effectiveness: Described as relatively effective, allowing for increase in activities of daily living (ADL) Side-effects or Adverse reactions: None reported Historical Background Evaluation: [place] PDMP: Five (5) year initial data search conducted. No abnormal patterns identified [place] [employer] Offender Public Information: Non-contributory Historical Hospital-associated UDS Results:   Lab Results  Component Value Date   THCU NONE DETECTED 05/13/2015   PCPSCRNUR NONE DETECTED 05/13/2015   MDMA NONE DETECTED 05/13/2015   AMPHETMU NONE DETECTED 05/13/2015   METHADONE NONE DETECTED 05/13/2015   UDS Results: No UDS results available at this time UDS Interpretation: N/A Medication Assessment Form: Not applicable. Initial evaluation. The patient has not received any medications from our practice Treatment  compliance: Not applicable. Initial evaluation Risk Assessment: Aberrant Behavior: None observed today Opioid Fatal Overdose Risk Factors: Sleep Apnea and High daily dosage Non-fatal overdose hazard ratio (HR): 2.88 for more than 200 MME/day Fatal overdose hazard  ratio (HR): 2.04 for more than 100 MME/day Substance Use Disorder (SUD) Risk Level: Pending results of Medical Psychology Evaluation for SUD Opioid Risk Tool (ORT) Score: Total Score: 1 Low Risk for SUD (Score <3) Depression Scale Score: PHQ-2: PHQ-2 Total Score: 0 No depression (0) PHQ-9: PHQ-9 Total Score: 0 No depression (0-4)  Pharmacologic Plan: Pending ordered tests and/or consults  Meds  The patient has a current medication list which includes the following prescription(s): acetaminophen, albuterol, budesonide, chlorphen-phenyleph-apap, cholecalciferol, citalopram, colesevelam, dexlansoprazole, fluticasone, fluticasone furoate-vilanterol, furosemide, guaifenesin-dextromethorphan, hydrocodone-acetaminophen, ipratropium-albuterol, loperamide, magnesium, oxybutynin, pregabalin, propranolol er, propranolol er, ranitidine, ropinirole, and tiotropium.  ROS  Cardiovascular History: Hypertension, Chest pain and Needs antibiotics prior to dental procedures Pulmonary or Respiratory History: Lung problems, Shortness of breath, Bronchitis and Sleep apnea Neurological History: Incontinence:  Urinary Psychological-Psychiatric History: Anxiety and Depression Gastrointestinal History: Reflux or heatburn Genitourinary History: Negative for nephrolithiasis, hematuria, renal failure or chronic kidney disease Hematological History: Negative for anticoagulant therapy, anemia, bruising or bleeding easily, hemophilia, sickle cell disease or trait, thrombocytopenia or coagulupathies Endocrine History: Non-insulin-dependent diabetes mellitus Rheumatologic History: Rheumatoid arthritis and Fibromyalgia Musculoskeletal History: Negative for  myasthenia gravis, muscular dystrophy, multiple sclerosis or malignant hyperthermia Work History: Disabled  Allergies  Loretta Wade has No Known Allergies.  [place]  Medical:  Ms. Sutter  has a past medical history of OA (osteoarthritis); Fibromyalgia; Anemia; Sleep apnea; Fatigue; Edema; Esophageal reflux; Nocturia; Hypertension; and COPD (chronic obstructive pulmonary disease) ([place]). Family: family history includes Breast cancer in her daughter; Hypertension in her mother. Surgical:  has past surgical history that includes Cholecystectomy; Total knee arthroplasty; Vesicovaginal fistula closure w/ TAH; and Abdominal hysterectomy. Tobacco:  reports that she quit smoking about 31 years ago. She does not have any smokeless tobacco history on file. Alcohol:  reports that she does not drink alcohol. Drug:  reports that she does not use illicit drugs.  Physical Examination  Constitutional Vitals:  Today's Vitals   06/10/15 1506 06/10/15 1508  BP:  143/114  Pulse:  74  Temp: 98.6 F (37 C)   Resp: 20 20  Height: 5\' 4"  (1.626 m)   Weight: 315 lb (142.883 kg)   SpO2:  95%  PainSc: 8  8   PainLoc: Chest    Calculated BMI: Body mass index is 54.04 kg/(m^2). Extreme obesity (Class III) (>40 kg/m2) - 254% higher incidence of chronic pain General appearance: alert, cooperative, appears older than stated age, mild distress and morbidly obese Eyes: PERLA Respiratory: No evidence respiratory distress, no audible rales or ronchi and no use of accessory muscles of respiration  Cervical Spine Exam  Inspection: Normal anatomy, no anomalies observed Cervical Lordosis: Normal Alignment: Symetrical Functional ROM: Within functional limits (WFL) AROM: WFL Sensory: No sensory abnormalities reported  Upper Extremity Exam   Right  Left  Inspection: No gross anomalies detected  Inspection: No gross anomalies detected  AROM: Adequate  AROM: Adequate  Sensory: Normal  Sensory: Normal  Motor:  Unremarkable       Motor: Unremarkable        Thoracic Spine  Inspection: No gross anomalies detected Alignment: Symetrical AROM: Decreased  Lumbar Spne  Inspection: No gross anomalies detected Alignment: Symetrical AROM: Restricted  Gait Assessment  Gait: Patient comes in today in a wheel chair, primarily due to her body habitus.  Lower Extremities   Right  Left  Inspection: No gross anomalies detected  Inspection: No gross anomalies detected  AROM: Adequate  AROM: Adequate  Sensory:  Normal  Sensory:  Normal  Motor: Grossly intact       Motor: Grossly intact        Assessment  Primary Diagnosis & Pertinent Problem List: The primary encounter diagnosis was Chronic pain. Diagnoses of Morbid obesity with BMI of 50.0-59.9, adult ([place]), Chronic low back pain, Chronic left shoulder pain, Costochondritis, Long term current use of opiate analgesic, Long term prescription opiate use, Opiate use, Encounter for therapeutic drug level monitoring, Encounter for pain management planning, Pain, foot, chronic, unspecified laterality, Osteoarthritis of spine, unspecified spinal osteoarthritis, unspecified spinal region, and Primary osteoarthritis of left shoulder were also pertinent to this visit.  Visit Diagnosis: 1. Chronic pain   2. Morbid obesity with BMI of 50.0-59.9, adult ([place])   3. Chronic low back pain   4. Chronic left shoulder pain   5. Costochondritis   6. Long term current use of opiate analgesic   7. Long term prescription opiate use   8. Opiate use   9. Encounter for therapeutic drug level monitoring   10. Encounter for pain management planning   11. Pain, foot, chronic, unspecified laterality   12. Osteoarthritis of spine, unspecified spinal osteoarthritis, unspecified spinal region   13. Primary osteoarthritis of left shoulder     Assessment: No problem-specific assessment & plan notes found for this encounter.   Plan of Care  Initial Treatment Plan:  Please be  advised that as per protocol, today's visit has been an evaluation only. We have not taken over the patient's controlled substance management.  Pharmacotherapy (Medications Ordered): No orders of the defined types were placed in this encounter.    Lab-work & Procedure Ordered: Orders Placed This Encounter  Procedures  . Compliance Drug Analysis, Ur  . Comprehensive metabolic panel  . C-reactive protein  . Magnesium  . Sedimentation rate  . Vitamin B12  . Vitamin D 1,25 dihydroxy  . Ambulatory referral to Psychology  . Amb ref to Medical Nutrition Therapy-MNT  . Ambulatory referral to General Surgery    Imaging Ordered: AMB REFERRAL TO PSYCHOLOGY AMB REFERRAL TO MEDICAL NUTRITION THERAPY (MNT) AMB REFERRAL TO GENERAL SURGERY  Interventional Therapies: Scheduled: None at this time. Considering: None at this time. PRN Procedures: None at this time.   Referral(s) or Consult(s): 1. Medical psychology consult for substance use disorder evaluation.  2. Referral to a nutritionist to assist in lowering the patient BMI to less than 30. 3. Referral to bariatric surgery to see if they can assist him ringing in this patient's BMI to less than 30.  Medications administered during this visit: Ms. Stull had no medications administered during this visit.  Prescriptions ordered during this visit: New Prescriptions   No medications on file    Future Appointments Date Time Provider [place]  06/21/2015 10:45 AM Wilhelmina Mcardle, MD [facility] None    Primary Care Physician: Keith Rake, MD Location: [facility] Note by: Kathlen Brunswick. Dossie Arbour, M.D, DABA, DABAPM, DABPM, DABIPP, FIPP

## 2015-06-10 NOTE — Telephone Encounter (Signed)
Returned call to [place] from [place] and she was requesting a prescription for Oxygen and a home health aid as well as a letter from [name] for a handicap apartment for patient, I notified Anderson Malta that a prescription has been faxed over to [facility] for a travel oxygen and a oxygen tank for home use as well as a nebulizer and a bed, patient called me yesterday 06/09/2015 and requesting not to go through [facility] for her oxygen supplies but to fax a  Prescription over to [facility] in [place], [place]. Prescription has been faxed over to [facility] for Oxygen supplies on 06/09/2015

## 2015-06-10 NOTE — Progress Notes (Signed)
Safety precautions to be maintained throughout the outpatient stay will include: orient to surroundings, keep bed in low position, maintain call bell within reach at all times, provide assistance with transfer out of bed and ambulation.  

## 2015-06-10 NOTE — Telephone Encounter (Signed)
Loretta Wade from [place] is requesting return call [phone]

## 2015-06-14 ENCOUNTER — Telehealth: Payer: Self-pay | Admitting: Family Medicine

## 2015-06-14 NOTE — Telephone Encounter (Signed)
Patient is requesting a return call back today

## 2015-06-15 NOTE — Telephone Encounter (Signed)
Returned patient call and she has an appointment scheduled for Thursday 06/17/2015

## 2015-06-16 ENCOUNTER — Telehealth: Payer: Self-pay | Admitting: Family Medicine

## 2015-06-16 NOTE — Telephone Encounter (Signed)
Jeanmarie Plant from [facility] called on behalf of patient. Anderson Malta is requesting a call back about a letter. Please return her call @ [phone]

## 2015-06-17 ENCOUNTER — Encounter: Payer: Self-pay | Admitting: Family Medicine

## 2015-06-17 ENCOUNTER — Ambulatory Visit (INDEPENDENT_AMBULATORY_CARE_PROVIDER_SITE_OTHER): Payer: Medicare PPO | Admitting: Family Medicine

## 2015-06-17 ENCOUNTER — Telehealth: Payer: Self-pay

## 2015-06-17 VITALS — BP 138/86 | HR 81 | Temp 97.8°F | Resp 20 | Wt 321.4 lb

## 2015-06-17 DIAGNOSIS — E785 Hyperlipidemia, unspecified: Secondary | ICD-10-CM | POA: Diagnosis not present

## 2015-06-17 DIAGNOSIS — N393 Stress incontinence (female) (male): Secondary | ICD-10-CM | POA: Insufficient documentation

## 2015-06-17 DIAGNOSIS — J019 Acute sinusitis, unspecified: Secondary | ICD-10-CM

## 2015-06-17 DIAGNOSIS — Z6841 Body Mass Index (BMI) 40.0 and over, adult: Secondary | ICD-10-CM | POA: Diagnosis not present

## 2015-06-17 DIAGNOSIS — N39490 Overflow incontinence: Secondary | ICD-10-CM

## 2015-06-17 DIAGNOSIS — Z7409 Other reduced mobility: Secondary | ICD-10-CM

## 2015-06-17 DIAGNOSIS — J441 Chronic obstructive pulmonary disease with (acute) exacerbation: Secondary | ICD-10-CM

## 2015-06-17 DIAGNOSIS — M797 Fibromyalgia: Secondary | ICD-10-CM | POA: Diagnosis not present

## 2015-06-17 DIAGNOSIS — J449 Chronic obstructive pulmonary disease, unspecified: Secondary | ICD-10-CM

## 2015-06-17 LAB — COMPLIANCE DRUG ANALYSIS, UR: PDF: 0

## 2015-06-17 MED ORDER — COLESEVELAM HCL 625 MG PO TABS: 625.0000 mg | ORAL_TABLET | Freq: Two times a day (BID) | ORAL | Status: DC

## 2015-06-17 MED ORDER — HYDROCODONE-ACETAMINOPHEN 5-325 MG PO TABS: 1.0000 | ORAL_TABLET | Freq: Three times a day (TID) | ORAL | Status: DC | PRN

## 2015-06-17 MED ORDER — NYSTATIN 100000 UNIT/ML MT SUSP: 5.0000 mL | Freq: Four times a day (QID) | OROMUCOSAL | Status: DC

## 2015-06-17 MED ORDER — LEVOFLOXACIN 500 MG PO TABS: 500.0000 mg | ORAL_TABLET | Freq: Every day | ORAL | Status: DC

## 2015-06-17 MED ORDER — OXYBUTYNIN CHLORIDE 5 MG PO TABS: 5.0000 mg | ORAL_TABLET | Freq: Every day | ORAL | Status: DC

## 2015-06-17 NOTE — Telephone Encounter (Signed)
Medication has been refilled and sent to [facility] 

## 2015-06-17 NOTE — Progress Notes (Signed)
Name: Loretta Wade   MRN: [id number]    DOB: 1950/08/20   Date:06/17/2015       Progress Note  Subjective  Chief Complaint  Chief Complaint  Patient presents with  . Oxygen    patient is here for her 2-minute walk test  . Medication Refill    HPI  Patient is presenting for 2 minute oxygen desaturation test to satisfy Medicare requirements in order for her to obtain portable home oxygen. She has COPD, is morbidly obese, has CHF, and requires oxygen at home to be supplied by [place].   In addition, she is also requesting referral to [facility] to assist with activities of daily living to include bathing, dressing, and physical therapy and Home health.  Patient requesting refills on the following medications; Oxybutynin, Hydrocodone-Acetaminophen, and Welchol. Oxybutynin for urinary incontinence, pt. Has occasional urinary incontinence wherein she notices wetting herself, has been on Oxybutynin 5 mg daily started by Dr. Yetta Flock at [facility].Oxybutynin helps with urinary incontinence.  Vicodin 5-325 mg every 8 hours as needed for pain associated with Fibromyalgia and low back pain. Takes it as directed and partially relieves the pain. Of note: pt. Was on Morphine while her stay at hospice and she is being seen by pain clinic for consideration to restart Morphine.  Welchol 625 mg twice daily for elevated cholesterol, prescribed by Dr. Yetta Flock at [facility]. Will obtain an update FLP.  Patient also complaining of sinus drainage and pressure in the face. Discharge summary from March 2017 reviewed and patient was started on azithromycin  which did not help at all according the patient. Past Medical History  Diagnosis Date  . OA (osteoarthritis)   . Fibromyalgia   . Anemia   . Sleep apnea   . Fatigue   . Edema   . Esophageal reflux   . Nocturia   . Hypertension   . COPD (chronic obstructive pulmonary disease) [facility])     Past Surgical History  Procedure Laterality Date  .  Cholecystectomy    . Total knee arthroplasty      right  . Vesicovaginal fistula closure w/ tah    . Abdominal hysterectomy      Family History  Problem Relation Age of Onset  . Hypertension Mother   . Diabetes    . Breast cancer Daughter     Social History   Social History  . Marital Status: Single    Spouse Name: N/A  . Number of Children: N/A  . Years of Education: N/A   Occupational History  . Not on file.   Social History Main Topics  . Smoking status: Former Smoker    Quit date: 11/04/1983  . Smokeless tobacco: Not on file  . Alcohol Use: No  . Drug Use: No  . Sexual Activity: Not on file   Other Topics Concern  . Not on file   Social History Narrative     Current outpatient prescriptions:  .  acetaminophen (TYLENOL) 325 MG tablet, Take 2 tablets (650 mg total) by mouth every 6 (six) hours as needed for mild pain (or Fever >/= 101)., Disp: 30 tablet, Rfl: 0 .  albuterol (PROVENTIL HFA;VENTOLIN HFA) 108 (90 Base) MCG/ACT inhaler, Inhale 2 puffs into the lungs every 6 (six) hours as needed for wheezing or shortness of breath., Disp: , Rfl:  .  budesonide (PULMICORT) 0.5 MG/2ML nebulizer solution, Take 0.5 mg by nebulization 2 (two) times daily. , Disp: , Rfl:  .  Chlorphen-Phenyleph-APAP (CORICIDIN D COLD/FLU/SINUS)  2-5-325 MG TABS, Take 2 tablets by mouth daily as needed (for nasal congestion/cough)., Disp: , Rfl:  .  cholecalciferol (VITAMIN D) 1000 UNITS tablet, Take 1,000 Units by mouth daily. , Disp: , Rfl:  .  citalopram (CELEXA) 20 MG tablet, Take 20 mg by mouth daily., Disp: , Rfl:  .  colesevelam (WELCHOL) 625 MG tablet, Take 625 mg by mouth 2 (two) times daily with a meal., Disp: , Rfl:  .  dexlansoprazole (DEXILANT) 60 MG capsule, Take 1 capsule (60 mg total) by mouth daily., Disp: 90 capsule, Rfl: 0 .  fluticasone (FLONASE) 50 MCG/ACT nasal spray, Place 2 sprays into both nostrils daily as needed for rhinitis., Disp: , Rfl:  .  fluticasone  furoate-vilanterol (BREO ELLIPTA) 100-25 MCG/INH AEPB, Inhale 1 puff into the lungs daily., Disp: 28 each, Rfl: 2 .  furosemide (LASIX) 40 MG tablet, Take 1 tablet (40 mg total) by mouth 2 (two) times daily., Disp: 30 tablet, Rfl: 60 .  guaiFENesin-dextromethorphan (ROBITUSSIN DM) 100-10 MG/5ML syrup, Take 5-10 mLs by mouth every 4 (four) hours as needed for cough. , Disp: , Rfl:  .  HYDROcodone-acetaminophen (NORCO/VICODIN) 5-325 MG tablet, Take 1 tablet by mouth every 8 (eight) hours as needed for moderate pain., Disp: 90 tablet, Rfl: 0 .  ipratropium-albuterol (DUONEB) 0.5-2.5 (3) MG/3ML SOLN, Take 3 mLs by nebulization every 6 (six) hours as needed (for wheezing/shortness of breath)., Disp: , Rfl:  .  loperamide (IMODIUM) 2 MG capsule, Take 2-4 mg by mouth as needed for diarrhea or loose stools., Disp: , Rfl:  .  Magnesium 250 MG TABS, Take 250 mg by mouth daily., Disp: , Rfl:  .  oxybutynin (DITROPAN) 5 MG tablet, Take 1 tablet (5 mg total) by mouth daily., Disp: 30 tablet, Rfl: 0 .  pregabalin (LYRICA) 150 MG capsule, Take 1 capsule (150 mg total) by mouth 2 (two) times daily., Disp: 60 capsule, Rfl: 0 .  propranolol ER (INDERAL LA) 80 MG 24 hr capsule, Take 80 mg by mouth daily., Disp: , Rfl:  .  propranolol ER (INDERAL LA) 80 MG 24 hr capsule, Take 1 capsule (80 mg total) by mouth daily., Disp: 90 capsule, Rfl: 0 .  ranitidine (ZANTAC) 150 MG tablet, Take 150 mg by mouth 3 (three) times daily as needed for heartburn., Disp: , Rfl:  .  rOPINIRole (REQUIP) 0.25 MG tablet, Take 3 tablets (0.75 mg total) by mouth at bedtime., Disp: 90 tablet, Rfl: 2 .  tiotropium (SPIRIVA) 18 MCG inhalation capsule, Place 1 capsule (18 mcg total) into inhaler and inhale daily., Disp: 30 capsule, Rfl: 2  No Known Allergies   Review of Systems  Constitutional: Negative for fever and chills.  Respiratory: Positive for cough (COPD, CHF), sputum production and shortness of breath.   Cardiovascular: Negative for  chest pain.  Musculoskeletal: Positive for myalgias, back pain and joint pain.     Objective  Filed Vitals:   06/17/15 0817  Pulse: 81  Temp: 97.8 F (36.6 C)  TempSrc: Oral  Resp: 20  SpO2: 89%    Physical Exam  Constitutional: She is well-developed, well-nourished, and in no distress.  HENT:  Nose: Right sinus exhibits maxillary sinus tenderness and frontal sinus tenderness. Left sinus exhibits maxillary sinus tenderness and frontal sinus tenderness.  White exudate on tongue and pharynx, likely c/w oral thrush  Cardiovascular: Normal rate and regular rhythm.   Pulmonary/Chest: She has decreased breath sounds. She has wheezes.  Nursing note and vitals reviewed.  Assessment & Plan  1. Morbid obesity with BMI of 50.0-59.9, adult [facility]) Referral to [facility] for assistance with ADLs, endurance training, and physical therapy. - Ambulatory referral to Home Health  2. Poor mobility  - Ambulatory referral to Home Health  3. Fibromyalgia Refill for hydrocodone provided. Patient has also started seeing pain clinic but has not been prescribed any controlled substances as yet. - HYDROcodone-acetaminophen (NORCO/VICODIN) 5-325 MG tablet; Take 1 tablet by mouth every 8 (eight) hours as needed for moderate pain.  Dispense: 90 tablet; Refill: 0 - Ambulatory referral to Home Health  4. Dyslipidemia Will obtain fasting lipid panel, refill for WelChol provided - colesevelam (WELCHOL) 625 MG tablet; Take 1 tablet (625 mg total) by mouth 2 (two) times daily with a meal.  Dispense: 60 tablet; Refill: 2 - Lipid Profile - Comprehensive Metabolic Panel (CMET)  5. Overflow stress incontinence of urine in female  - oxybutynin (DITROPAN) 5 MG tablet; Take 1 tablet (5 mg total) by mouth daily.  Dispense: 30 tablet; Refill: 2  6. Acute sinusitis treated with antibiotics in the past 60 days We will start on levofloxacin 500 mg daily for 10 days, advised to discontinue taking  citalopram due to risk of QT prolongation while she is on levofloxacin. - levofloxacin (LEVAQUIN) 500 MG tablet; Take 1 tablet (500 mg total) by mouth daily.  Dispense: 10 tablet; Refill: 0  Note: On second round of ambulation, patient's oxygen saturation dropped to 88%, documented in her vital signs.  Lise Pincus Asad [facility] 06/17/2015 8:21 AM

## 2015-06-21 ENCOUNTER — Emergency Department: Payer: Medicare PPO

## 2015-06-21 ENCOUNTER — Emergency Department
Admission: EM | Admit: 2015-06-21 | Discharge: 2015-06-21 | Disposition: A | Discharge status: Home / Self Care | Payer: Medicare PPO | Attending: Emergency Medicine | Admitting: Emergency Medicine

## 2015-06-21 ENCOUNTER — Institutional Professional Consult (permissible substitution): Payer: Medicare PPO | Admitting: Pulmonary Disease

## 2015-06-21 ENCOUNTER — Encounter: Payer: Self-pay | Admitting: Emergency Medicine

## 2015-06-21 ENCOUNTER — Ambulatory Visit: Payer: Medicare PPO | Admitting: Family Medicine

## 2015-06-21 DIAGNOSIS — M545 Low back pain: Secondary | ICD-10-CM | POA: Diagnosis not present

## 2015-06-21 DIAGNOSIS — N390 Urinary tract infection, site not specified: Secondary | ICD-10-CM | POA: Diagnosis not present

## 2015-06-21 DIAGNOSIS — J441 Chronic obstructive pulmonary disease with (acute) exacerbation: Secondary | ICD-10-CM | POA: Diagnosis not present

## 2015-06-21 DIAGNOSIS — M1990 Unspecified osteoarthritis, unspecified site: Secondary | ICD-10-CM | POA: Insufficient documentation

## 2015-06-21 DIAGNOSIS — M25512 Pain in left shoulder: Secondary | ICD-10-CM | POA: Diagnosis not present

## 2015-06-21 DIAGNOSIS — R109 Unspecified abdominal pain: Secondary | ICD-10-CM

## 2015-06-21 DIAGNOSIS — I110 Hypertensive heart disease with heart failure: Secondary | ICD-10-CM | POA: Diagnosis not present

## 2015-06-21 DIAGNOSIS — Z79899 Other long term (current) drug therapy: Secondary | ICD-10-CM | POA: Insufficient documentation

## 2015-06-21 DIAGNOSIS — M79671 Pain in right foot: Secondary | ICD-10-CM | POA: Diagnosis not present

## 2015-06-21 DIAGNOSIS — Z87891 Personal history of nicotine dependence: Secondary | ICD-10-CM | POA: Diagnosis not present

## 2015-06-21 DIAGNOSIS — G8929 Other chronic pain: Secondary | ICD-10-CM | POA: Diagnosis not present

## 2015-06-21 DIAGNOSIS — I5033 Acute on chronic diastolic (congestive) heart failure: Secondary | ICD-10-CM | POA: Insufficient documentation

## 2015-06-21 DIAGNOSIS — Z6841 Body Mass Index (BMI) 40.0 and over, adult: Secondary | ICD-10-CM | POA: Diagnosis not present

## 2015-06-21 DIAGNOSIS — E785 Hyperlipidemia, unspecified: Secondary | ICD-10-CM | POA: Diagnosis not present

## 2015-06-21 DIAGNOSIS — Z7951 Long term (current) use of inhaled steroids: Secondary | ICD-10-CM | POA: Insufficient documentation

## 2015-06-21 DIAGNOSIS — M542 Cervicalgia: Secondary | ICD-10-CM | POA: Diagnosis not present

## 2015-06-21 LAB — URINALYSIS COMPLETE WITH MICROSCOPIC (ARMC ONLY)
BILIRUBIN URINE: NEGATIVE
Glucose, UA: NEGATIVE mg/dL
HGB URINE DIPSTICK: NEGATIVE
KETONES UR: NEGATIVE mg/dL
Nitrite: POSITIVE — AB
PH: 5 (ref 5.0–8.0)
PROTEIN: NEGATIVE mg/dL
Specific Gravity, Urine: 1.023 (ref 1.005–1.030)

## 2015-06-21 LAB — COMPREHENSIVE METABOLIC PANEL
ALT: 30 U/L (ref 14–54)
AST: 30 U/L (ref 15–41)
Albumin: 3.4 g/dL — ABNORMAL LOW (ref 3.5–5.0)
Alkaline Phosphatase: 111 U/L (ref 38–126)
Anion gap: 5 (ref 5–15)
BUN: 12 mg/dL (ref 6–20)
CHLORIDE: 107 mmol/L (ref 101–111)
CO2: 27 mmol/L (ref 22–32)
CREATININE: 0.65 mg/dL (ref 0.44–1.00)
Calcium: 9.1 mg/dL (ref 8.9–10.3)
GFR calc Af Amer: >60 mL/min (ref >60)
GFR calc non Af Amer: >60 mL/min (ref >60)
GLUCOSE: 116 mg/dL — AB (ref 65–99)
POTASSIUM: 3.8 mmol/L (ref 3.5–5.1)
SODIUM: 139 mmol/L (ref 135–145)
Total Bilirubin: 0.6 mg/dL (ref 0.3–1.2)
Total Protein: 6.6 g/dL (ref 6.5–8.1)

## 2015-06-21 LAB — CBC
HCT: 35.4 % (ref 35.0–47.0)
Hemoglobin: 11.7 g/dL — ABNORMAL LOW (ref 12.0–16.0)
MCH: 30.1 pg (ref 26.0–34.0)
MCHC: 33.2 g/dL (ref 32.0–36.0)
MCV: 90.7 fL (ref 80.0–100.0)
PLATELETS: 243 10*3/uL (ref 150–440)
RBC: 3.9 MIL/uL (ref 3.80–5.20)
RDW: 17.2 % — ABNORMAL HIGH (ref 11.5–14.5)
WBC: 5.9 10*3/uL (ref 3.6–11.0)

## 2015-06-21 LAB — LIPASE, BLOOD: LIPASE: 15 U/L (ref 11–51)

## 2015-06-21 MED ORDER — CIPROFLOXACIN HCL 500 MG PO TABS: 500.0000 mg | ORAL_TABLET | Freq: Two times a day (BID) | ORAL | Status: DC

## 2015-06-21 MED ORDER — ONDANSETRON HCL 4 MG/2ML IJ SOLN
4.0000 mg | Freq: Once | INTRAMUSCULAR | Status: AC
  Administered 2015-06-21: 4 mg via INTRAVENOUS
  Filled 2015-06-21: qty 2

## 2015-06-21 MED ORDER — MORPHINE SULFATE (PF) 4 MG/ML IV SOLN
4.0000 mg | Freq: Once | INTRAVENOUS | Status: AC
  Administered 2015-06-21: 4 mg via INTRAVENOUS
  Filled 2015-06-21: qty 1

## 2015-06-21 NOTE — ED Notes (Signed)
Patient transported to CT 

## 2015-06-21 NOTE — ED Notes (Signed)
Pt arrived from sister's house via EMS. Pt reports right side flank pain upon movement. Denies dysuria or blood in urine. Denies N/V/D. EMS reports 151/74. Pt wears 2L O2 at all times, EMS reports SpO2 96%. EMS reports bilateral wheezing and administered duoneb x1.

## 2015-06-21 NOTE — Discharge Instructions (Signed)
Abdominal Pain, Adult Many things can cause belly (abdominal) pain. Most times, the belly pain is not dangerous. Many cases of belly pain can be watched and treated at home. HOME CARE   Do not take medicines that help you go poop (laxatives) unless told to by your doctor.  Only take medicine as told by your doctor.  Eat or drink as told by your doctor. Your doctor will tell you if you should be on a special diet. GET HELP IF:  You do not know what is causing your belly pain.  You have belly pain while you are sick to your stomach (nauseous) or have runny poop (diarrhea).  You have pain while you pee or poop.  Your belly pain wakes you up at night.  You have belly pain that gets worse or better when you eat.  You have belly pain that gets worse when you eat fatty foods.  You have a fever. GET HELP RIGHT AWAY IF:   The pain does not go away within 2 hours.  You keep throwing up (vomiting).  The pain changes and is only in the right or left part of the belly.  You have bloody or tarry looking poop. MAKE SURE YOU:   Understand these instructions.  Will watch your condition.  Will get help right away if you are not doing well or get worse.   This information is not intended to replace advice given to you by your health care provider. Make sure you discuss any questions you have with your health care provider.   Document Released: 08/09/2007 Document Revised: 03/13/2014 Document Reviewed: 10/30/2012 Elsevier Interactive Patient Education 2016 Elsevier Inc.  Urinary Tract Infection Urinary tract infections (UTIs) can develop anywhere along your urinary tract. Your urinary tract is your body's drainage system for removing wastes and extra water. Your urinary tract includes two kidneys, two ureters, a bladder, and a urethra. Your kidneys are a pair of bean-shaped organs. Each kidney is about the size of your fist. They are located below your ribs, one on each side of your  spine. CAUSES Infections are caused by microbes, which are microscopic organisms, including fungi, viruses, and bacteria. These organisms are so small that they can only be seen through a microscope. Bacteria are the microbes that most commonly cause UTIs. SYMPTOMS  Symptoms of UTIs may vary by age and gender of the patient and by the location of the infection. Symptoms in young women typically include a frequent and intense urge to urinate and a painful, burning feeling in the bladder or urethra during urination. Older women and men are more likely to be tired, shaky, and weak and have muscle aches and abdominal pain. A fever may mean the infection is in your kidneys. Other symptoms of a kidney infection include pain in your back or sides below the ribs, nausea, and vomiting. DIAGNOSIS To diagnose a UTI, your caregiver will ask you about your symptoms. Your caregiver will also ask you to provide a urine sample. The urine sample will be tested for bacteria and white blood cells. White blood cells are made by your body to help fight infection. TREATMENT  Typically, UTIs can be treated with medication. Because most UTIs are caused by a bacterial infection, they usually can be treated with the use of antibiotics. The choice of antibiotic and length of treatment depend on your symptoms and the type of bacteria causing your infection. HOME CARE INSTRUCTIONS  If you were prescribed antibiotics, take them exactly as  your caregiver instructs you. Finish the medication even if you feel better after you have only taken some of the medication.  Drink enough water and fluids to keep your urine clear or pale yellow.  Avoid caffeine, tea, and carbonated beverages. They tend to irritate your bladder.  Empty your bladder often. Avoid holding urine for long periods of time.  Empty your bladder before and after sexual intercourse.  After a bowel movement, women should cleanse from front to back. Use each tissue  only once. SEEK MEDICAL CARE IF:   You have back pain.  You develop a fever.  Your symptoms do not begin to resolve within 3 days. SEEK IMMEDIATE MEDICAL CARE IF:   You have severe back pain or lower abdominal pain.  You develop chills.  You have nausea or vomiting.  You have continued burning or discomfort with urination. MAKE SURE YOU:   Understand these instructions.  Will watch your condition.  Will get help right away if you are not doing well or get worse.   This information is not intended to replace advice given to you by your health care provider. Make sure you discuss any questions you have with your health care provider.   Document Released: 11/30/2004 Document Revised: 11/11/2014 Document Reviewed: 03/31/2011 Elsevier Interactive Patient Education [employer].

## 2015-06-21 NOTE — ED Provider Notes (Signed)
[facility] Emergency Department Provider Note  ____________________________________________    I have reviewed the triage vital signs and the nursing notes.   HISTORY  Chief Complaint Flank Pain    HPI Loretta Wade is a 65 y.o. female who presents with complaints of right flank pain. Reportedly this pain started last night. She is never had this pain before. She reports it is sharp in nature and moderate to severe. She denies a history of kidney stones. She denies dysuria. No vaginal discharge. No fevers or chills. Patient with a history of COPD and wears 2 L of nasal cannula oxygen at all times.     Past Medical History  Diagnosis Date  . OA (osteoarthritis)   . Fibromyalgia   . Anemia   . Sleep apnea   . Fatigue   . Edema   . Esophageal reflux   . Nocturia   . Hypertension   . COPD (chronic obstructive pulmonary disease) [facility])     Patient Active Problem List   Diagnosis Date Noted  . Dyslipidemia 06/17/2015  . Overflow stress incontinence of urine in female 06/17/2015  . Acute sinusitis treated with antibiotics in the past 60 days 06/17/2015  . Chronic pain 06/10/2015  . Chronic low back pain (Location of Tertiary source of pain) (Bilateral) (R>L) 06/10/2015  . Chronic shoulder pain (Location of Secondary source of pain) (Left) 06/10/2015  . Costochondritis (Location of Primary Source of Pain) 06/10/2015  . Long term current use of opiate analgesic 06/10/2015  . Long term prescription opiate use 06/10/2015  . Opiate use 06/10/2015  . Encounter for therapeutic drug level monitoring 06/10/2015  . Encounter for pain management planning 06/10/2015  . Chronic feet pain (Bilateral) (R>L) 06/10/2015  . Osteoarthritis 06/10/2015  . Osteoarthritis of shoulder (Left) 06/10/2015  . COPD exacerbation ([place]) 04/12/2015  . Poor mobility 04/12/2015  . Fibromyalgia 03/23/2015  . Esophageal reflux 03/23/2015  . MRSA infection 11/17/2014  .  Diastolic CHF, acute on chronic (HCC) 11/12/2014  . Morbid obesity with BMI of 50.0-59.9, adult ([place]) 11/12/2014  . Neck pain, chronic 11/12/2014  . COPD, frequent exacerbations ([place]) 11/04/2014    Past Surgical History  Procedure Laterality Date  . Cholecystectomy    . Total knee arthroplasty      right  . Vesicovaginal fistula closure w/ tah    . Abdominal hysterectomy      Current Outpatient Rx  Name  Route  Sig  Dispense  Refill  . acetaminophen (TYLENOL) 325 MG tablet   Oral   Take 2 tablets (650 mg total) by mouth every 6 (six) hours as needed for mild pain (or Fever >/= 101).   30 tablet   0   . albuterol (PROVENTIL HFA;VENTOLIN HFA) 108 (90 Base) MCG/ACT inhaler   Inhalation   Inhale 2 puffs into the lungs every 6 (six) hours as needed for wheezing or shortness of breath.         . budesonide (PULMICORT) 0.5 MG/2ML nebulizer solution   Nebulization   Take 0.5 mg by nebulization 2 (two) times daily.          . Chlorphen-Phenyleph-APAP (CORICIDIN D COLD/FLU/SINUS) 2-5-325 MG TABS   Oral   Take 2 tablets by mouth daily as needed (for nasal congestion/cough).         . cholecalciferol (VITAMIN D) 1000 UNITS tablet   Oral   Take 1,000 Units by mouth daily.          . ciprofloxacin (CIPRO)  500 MG tablet   Oral   Take 1 tablet (500 mg total) by mouth 2 (two) times daily.   14 tablet   0   . citalopram (CELEXA) 20 MG tablet   Oral   Take 20 mg by mouth daily.         . colesevelam (WELCHOL) 625 MG tablet   Oral   Take 1 tablet (625 mg total) by mouth 2 (two) times daily with a meal.   60 tablet   2   . dexlansoprazole (DEXILANT) 60 MG capsule   Oral   Take 1 capsule (60 mg total) by mouth daily.   90 capsule   0   . fluticasone (FLONASE) 50 MCG/ACT nasal spray   Each Nare   Place 2 sprays into both nostrils daily as needed for rhinitis.         . fluticasone furoate-vilanterol (BREO ELLIPTA) 100-25 MCG/INH AEPB   Inhalation   Inhale 1  puff into the lungs daily.   28 each   2   . furosemide (LASIX) 40 MG tablet   Oral   Take 1 tablet (40 mg total) by mouth 2 (two) times daily.   30 tablet   60   . guaiFENesin-dextromethorphan (ROBITUSSIN DM) 100-10 MG/5ML syrup   Oral   Take 5-10 mLs by mouth every 4 (four) hours as needed for cough.          Marland Kitchen HYDROcodone-acetaminophen (NORCO/VICODIN) 5-325 MG tablet   Oral   Take 1 tablet by mouth every 8 (eight) hours as needed for moderate pain.   90 tablet   0   . ipratropium-albuterol (DUONEB) 0.5-2.5 (3) MG/3ML SOLN   Nebulization   Take 3 mLs by nebulization every 6 (six) hours as needed (for wheezing/shortness of breath).         Marland Kitchen levofloxacin (LEVAQUIN) 500 MG tablet   Oral   Take 1 tablet (500 mg total) by mouth daily.   10 tablet   0   . loperamide (IMODIUM) 2 MG capsule   Oral   Take 2-4 mg by mouth as needed for diarrhea or loose stools.         . Magnesium 250 MG TABS   Oral   Take 250 mg by mouth daily.         Marland Kitchen nystatin (MYCOSTATIN) 100000 UNIT/ML suspension   Oral   Take 5 mLs (500,000 Units total) by mouth 4 (four) times daily. For 10 days   240 mL   0   . oxybutynin (DITROPAN) 5 MG tablet   Oral   Take 1 tablet (5 mg total) by mouth daily.   30 tablet   2   . pregabalin (LYRICA) 150 MG capsule   Oral   Take 1 capsule (150 mg total) by mouth 2 (two) times daily.   60 capsule   0   . propranolol ER (INDERAL LA) 80 MG 24 hr capsule   Oral   Take 80 mg by mouth daily.         . propranolol ER (INDERAL LA) 80 MG 24 hr capsule   Oral   Take 1 capsule (80 mg total) by mouth daily.   90 capsule   0   . ranitidine (ZANTAC) 150 MG tablet   Oral   Take 150 mg by mouth 3 (three) times daily as needed for heartburn.         Marland Kitchen rOPINIRole (REQUIP) 0.25 MG tablet   Oral  Take 3 tablets (0.75 mg total) by mouth at bedtime.   90 tablet   2   . tiotropium (SPIRIVA) 18 MCG inhalation capsule   Inhalation   Place 1 capsule  (18 mcg total) into inhaler and inhale daily.   30 capsule   2     Allergies Review of patient's allergies indicates no known allergies.  Family History  Problem Relation Age of Onset  . Hypertension Mother   . Diabetes    . Breast cancer Daughter     Social History Social History  Substance Use Topics  . Smoking status: Former Smoker    Quit date: 11/04/1983  . Smokeless tobacco: None  . Alcohol Use: No    Review of Systems  Constitutional: Negative for fever. Eyes: Negative for redness ENT: Negative for sore throat Cardiovascular: Negative for chest pain Respiratory: Chronic shortness of breath, no cough Gastrointestinal: Flank pain as above Genitourinary: Negative for dysuria. No hematuria Musculoskeletal: Negative for back pain. Skin: Negative for rash. Neurological: Negative for focal weakness, negative for headache Psychiatric: Positive for anxiety  ____________________________________________   PHYSICAL EXAM:  VITAL SIGNS: ED Triage Vitals  Enc Vitals Group     BP 06/21/15 0828 146/75 mmHg     Pulse Rate 06/21/15 0828 76     Resp 06/21/15 0828 18     Temp 06/21/15 0828 98.4 F (36.9 C)     Temp Source 06/21/15 0828 Oral     SpO2 06/21/15 0828 97 %     Weight 06/21/15 0828 321 lb (145.605 kg)     Height 06/21/15 0828 5\' 4"  (1.626 m)     Head Cir --      Peak Flow --      Pain Score 06/21/15 0829 9     Pain Loc --      Pain Edu? --      Excl. in [place]? --      Constitutional: Alert and oriented. Appears uncomfortable but in no distress Eyes: Conjunctivae are normal. No erythema or injection ENT   Head: Normocephalic and atraumatic.   Mouth/Throat: Mucous membranes are moist. Cardiovascular: Normal rate, regular rhythm. Normal and symmetric distal pulses are present in the upper extremities.  Respiratory: Normal respiratory effort without tachypnea nor retractions. Scattered wheezes bilaterally, moderate air movement Gastrointestinal:  Mild tenderness over the right flank. No right upper quadrant or right lower quadrant tenderness to palpation. No distention. There is no CVA tenderness. Genitourinary: deferred Musculoskeletal: Nontender with normal range of motion in all extremities.  Neurologic:  Normal speech and language. No gross focal neurologic deficits are appreciated. Skin:  Skin is warm, dry and intact. No rash noted. Psychiatric: Mood and affect are normal. Patient exhibits appropriate insight and judgment.  ____________________________________________    LABS (pertinent positives/negatives)  Labs Reviewed  CBC - Abnormal; Notable for the following:    Hemoglobin 11.7 (*)    RDW 17.2 (*)    All other components within normal limits  COMPREHENSIVE METABOLIC PANEL - Abnormal; Notable for the following:    Glucose, Bld 116 (*)    Albumin 3.4 (*)    All other components within normal limits  URINALYSIS COMPLETEWITH MICROSCOPIC ([facility] ONLY) - Abnormal; Notable for the following:    Color, Urine YELLOW (*)    APPearance CLEAR (*)    Nitrite POSITIVE (*)    Leukocytes, UA 1+ (*)    Bacteria, UA MANY (*)    Squamous Epithelial / LPF 0-5 (*)    All  other components within normal limits  LIPASE, BLOOD    ____________________________________________   EKG  None  ____________________________________________    RADIOLOGY  CT renal stone study shows no acute abnormalities  ____________________________________________   PROCEDURES  Procedure(s) performed: none  Critical Care performed: none  ____________________________________________   INITIAL IMPRESSION / ASSESSMENT AND PLAN / ED COURSE  Pertinent labs & imaging results that were available during my care of the patient were reviewed by me and considered in my medical decision making (see chart for details).  Patient persist with right flank pain. We will give IV analgesia, morphine, Zofran and obtain CT renal stone study after lab work.  Suspicion for ureterolithiasis  Patient had complete resolution of pain after IV analgesics. CT scan is reassuring. Possible UTI on urinalysis. Discussed results with patient and she feels comfortable with going home with outpatient antibiotics. She will return if a recurrence of her pain.    ____________________________________________   FINAL CLINICAL IMPRESSION(S) / ED DIAGNOSES  Final diagnoses:  Flank pain, acute  UTI (lower urinary tract infection)          Lavonia Drafts, MD 06/21/15 1244

## 2015-06-24 ENCOUNTER — Telehealth: Payer: Self-pay | Admitting: Family Medicine

## 2015-06-24 NOTE — Telephone Encounter (Signed)
Patient was seen last week and everything was done for [employer] however they informed her that there is some missing information and that they have faxed the information back to [place] to complete.

## 2015-06-25 ENCOUNTER — Telehealth: Payer: Self-pay | Admitting: Family Medicine

## 2015-06-25 NOTE — Telephone Encounter (Signed)
Returned call to Jerene Pitch from [facility] and suggested that patient go to ER if the pain persists due to [name] not being in office on today 06/25/2015, also routed this note to [name]

## 2015-06-25 NOTE — Telephone Encounter (Signed)
Returned patient call and spoke with her concerning her oxygen from [place] and all forms have been submitted

## 2015-06-25 NOTE — Telephone Encounter (Signed)
Loretta Wade from [place]: patient was told to continue to take the medication for UTI however she is in severe pain in the lower right quadrant. Erin then stated that patient said she may have to go to the ER if the pain persisted. I informed her that Dr Manuella Ghazi is out of the office today and advised her to go to ER. Erin asked for me to place the message anyway to see what the nurse suggests Please advise [phone]

## 2015-06-29 ENCOUNTER — Telehealth: Payer: Self-pay | Admitting: Family Medicine

## 2015-06-29 DIAGNOSIS — M797 Fibromyalgia: Secondary | ICD-10-CM

## 2015-06-29 MED ORDER — PREGABALIN 150 MG PO CAPS: 150.0000 mg | ORAL_CAPSULE | Freq: Two times a day (BID) | ORAL | Status: DC

## 2015-06-29 NOTE — Telephone Encounter (Signed)
Routed to [name] for pt call back

## 2015-06-29 NOTE — Telephone Encounter (Signed)
Called patient and she is asking about hospitalization for notable and home oxygen. I have asked her to have [facility] contact our office to discuss if she needs a 3 step test where recheck oxygen at rest, then with ambulation, and finally with providing supplemental oxygen after ambulation. In addition, patient also needs a refill on Lyrica, which is printed

## 2015-06-29 NOTE — Telephone Encounter (Signed)
Please call pt about oxygen

## 2015-06-29 NOTE — Telephone Encounter (Signed)
Routed to Dr. Shah for advice  

## 2015-06-29 NOTE — Telephone Encounter (Signed)
Pt would like a call back

## 2015-07-01 ENCOUNTER — Telehealth: Payer: Self-pay | Admitting: Family Medicine

## 2015-07-01 NOTE — Telephone Encounter (Signed)
PT SAYS THAT [facility] IS JUST NEEDING THE RX FOR OXYGEN. PLEASE FAX TO [phone] THE RX FOR OXYGEN,

## 2015-07-06 ENCOUNTER — Telehealth: Payer: Self-pay | Admitting: Family Medicine

## 2015-07-06 NOTE — Telephone Encounter (Signed)
Mariel Kansky from [facility] will be going to see patient today. She is requesting an order for urinalysis due to patient complaining of UTI. She has finished the antibotic (cipro) but patient seems to think it has not left. Requesting a call back within a hour  [phone]

## 2015-07-07 ENCOUNTER — Telehealth: Payer: Self-pay | Admitting: Family Medicine

## 2015-07-07 NOTE — Telephone Encounter (Signed)
Lattie Haw is checking status on the order or a verbal

## 2015-07-07 NOTE — Telephone Encounter (Signed)
I have reviewed and completed the paperwork that was forwarded to me by [facility].

## 2015-07-07 NOTE — Telephone Encounter (Signed)
PT SAID THAT THEY HAVE NOT RECEIVED THE PAPER FROM [place]. SAID THAT THEY HAD SENT THIS LAST WEEK TO BE FILED OUT BUT HAS NOT RECEIVED IT YET. WHAT IS THE STATU ON THIS PAPER WORK.

## 2015-07-07 NOTE — Telephone Encounter (Signed)
Returned Loretta Wade's call and her mailbox was full so I could not leave a message

## 2015-07-07 NOTE — Telephone Encounter (Signed)
Patient was seen and diagnosed with a UTI in the ER on April 17th. If she still has residual symptoms, we may obtain a second urinalysis with culture

## 2015-07-08 ENCOUNTER — Telehealth: Payer: Self-pay | Admitting: Family Medicine

## 2015-07-08 NOTE — Telephone Encounter (Signed)
errenous °

## 2015-07-09 ENCOUNTER — Telehealth: Payer: Self-pay | Admitting: Family Medicine

## 2015-07-09 NOTE — Telephone Encounter (Signed)
Gave her contact info for Loretta Wade from [facility] who requested the order for the urine specimen and whom the results would have gone to. Also informed her that Manuella Ghazi has left for the day and that he would not be able to order any medications for her until Monday.

## 2015-07-09 NOTE — Telephone Encounter (Signed)
Left vm with DX code and told her Manuella Ghazi was out of the office until Monday so if she has any other concerns to call back then.

## 2015-07-09 NOTE — Telephone Encounter (Signed)
Pt would like a call back about a UTI. She was told to call by 11 to get her results.

## 2015-07-09 NOTE — Telephone Encounter (Signed)
Loretta Wade is requesting return call today. States patient has compliant for UTI and wanted to know if her doctor would prescribe something or if we have to wait for test results.

## 2015-07-09 NOTE — Telephone Encounter (Signed)
Spoke with patient and informed her there are currently no results as of yet from the Lab

## 2015-07-09 NOTE — Telephone Encounter (Signed)
Loretta Wade from [facility] requesting return call. Needing DX code to support patient incontience for her supplies. Would like a call back today. [phone]

## 2015-07-11 ENCOUNTER — Observation Stay
Admission: EM | Admit: 2015-07-11 | Discharge: 2015-07-14 | Disposition: A | Discharge status: Skilled Nursing Facility | Payer: Medicare PPO | Attending: Internal Medicine | Admitting: Internal Medicine

## 2015-07-11 ENCOUNTER — Emergency Department: Payer: Medicare PPO

## 2015-07-11 ENCOUNTER — Encounter: Payer: Self-pay | Admitting: Emergency Medicine

## 2015-07-11 DIAGNOSIS — J449 Chronic obstructive pulmonary disease, unspecified: Secondary | ICD-10-CM | POA: Diagnosis not present

## 2015-07-11 DIAGNOSIS — Z6841 Body Mass Index (BMI) 40.0 and over, adult: Secondary | ICD-10-CM | POA: Insufficient documentation

## 2015-07-11 DIAGNOSIS — Z90710 Acquired absence of both cervix and uterus: Secondary | ICD-10-CM | POA: Insufficient documentation

## 2015-07-11 DIAGNOSIS — R300 Dysuria: Secondary | ICD-10-CM | POA: Insufficient documentation

## 2015-07-11 DIAGNOSIS — K219 Gastro-esophageal reflux disease without esophagitis: Secondary | ICD-10-CM | POA: Insufficient documentation

## 2015-07-11 DIAGNOSIS — T1490 Injury, unspecified: Secondary | ICD-10-CM | POA: Diagnosis present

## 2015-07-11 DIAGNOSIS — M7989 Other specified soft tissue disorders: Secondary | ICD-10-CM | POA: Insufficient documentation

## 2015-07-11 DIAGNOSIS — Z7951 Long term (current) use of inhaled steroids: Secondary | ICD-10-CM | POA: Diagnosis not present

## 2015-07-11 DIAGNOSIS — N281 Cyst of kidney, acquired: Secondary | ICD-10-CM | POA: Diagnosis not present

## 2015-07-11 DIAGNOSIS — G8929 Other chronic pain: Secondary | ICD-10-CM | POA: Insufficient documentation

## 2015-07-11 DIAGNOSIS — R109 Unspecified abdominal pain: Secondary | ICD-10-CM | POA: Diagnosis not present

## 2015-07-11 DIAGNOSIS — R590 Localized enlarged lymph nodes: Secondary | ICD-10-CM | POA: Insufficient documentation

## 2015-07-11 DIAGNOSIS — Z9049 Acquired absence of other specified parts of digestive tract: Secondary | ICD-10-CM | POA: Insufficient documentation

## 2015-07-11 DIAGNOSIS — K429 Umbilical hernia without obstruction or gangrene: Secondary | ICD-10-CM | POA: Insufficient documentation

## 2015-07-11 DIAGNOSIS — M1990 Unspecified osteoarthritis, unspecified site: Secondary | ICD-10-CM | POA: Diagnosis not present

## 2015-07-11 DIAGNOSIS — Z79891 Long term (current) use of opiate analgesic: Secondary | ICD-10-CM | POA: Diagnosis not present

## 2015-07-11 DIAGNOSIS — W19XXXA Unspecified fall, initial encounter: Secondary | ICD-10-CM | POA: Insufficient documentation

## 2015-07-11 DIAGNOSIS — R102 Pelvic and perineal pain: Secondary | ICD-10-CM | POA: Diagnosis not present

## 2015-07-11 DIAGNOSIS — M25551 Pain in right hip: Secondary | ICD-10-CM | POA: Diagnosis not present

## 2015-07-11 DIAGNOSIS — G4733 Obstructive sleep apnea (adult) (pediatric): Secondary | ICD-10-CM | POA: Diagnosis not present

## 2015-07-11 DIAGNOSIS — Z87891 Personal history of nicotine dependence: Secondary | ICD-10-CM | POA: Diagnosis not present

## 2015-07-11 DIAGNOSIS — Z9981 Dependence on supplemental oxygen: Secondary | ICD-10-CM | POA: Insufficient documentation

## 2015-07-11 DIAGNOSIS — M2578 Osteophyte, vertebrae: Secondary | ICD-10-CM | POA: Diagnosis not present

## 2015-07-11 DIAGNOSIS — I110 Hypertensive heart disease with heart failure: Secondary | ICD-10-CM | POA: Insufficient documentation

## 2015-07-11 DIAGNOSIS — I5032 Chronic diastolic (congestive) heart failure: Secondary | ICD-10-CM | POA: Insufficient documentation

## 2015-07-11 DIAGNOSIS — M79672 Pain in left foot: Secondary | ICD-10-CM | POA: Insufficient documentation

## 2015-07-11 DIAGNOSIS — M797 Fibromyalgia: Secondary | ICD-10-CM | POA: Diagnosis not present

## 2015-07-11 DIAGNOSIS — E669 Obesity, unspecified: Secondary | ICD-10-CM | POA: Diagnosis not present

## 2015-07-11 DIAGNOSIS — Z79899 Other long term (current) drug therapy: Secondary | ICD-10-CM | POA: Insufficient documentation

## 2015-07-11 HISTORY — DX: Heart failure, unspecified: I50.9

## 2015-07-11 LAB — URINALYSIS COMPLETE WITH MICROSCOPIC (ARMC ONLY)
Bilirubin Urine: NEGATIVE
GLUCOSE, UA: NEGATIVE mg/dL
HGB URINE DIPSTICK: NEGATIVE
Ketones, ur: NEGATIVE mg/dL
Leukocytes, UA: NEGATIVE
Nitrite: NEGATIVE
PH: 5 (ref 5.0–8.0)
PROTEIN: NEGATIVE mg/dL
Specific Gravity, Urine: 1.011 (ref 1.005–1.030)

## 2015-07-11 LAB — COMPREHENSIVE METABOLIC PANEL
ALT: 19 U/L (ref 14–54)
AST: 20 U/L (ref 15–41)
Albumin: 3.4 g/dL — ABNORMAL LOW (ref 3.5–5.0)
Alkaline Phosphatase: 100 U/L (ref 38–126)
Anion gap: 7 (ref 5–15)
BUN: 13 mg/dL (ref 6–20)
CO2: 31 mmol/L (ref 22–32)
Calcium: 9.1 mg/dL (ref 8.9–10.3)
Chloride: 103 mmol/L (ref 101–111)
Creatinine, Ser: 0.71 mg/dL (ref 0.44–1.00)
GFR calc Af Amer: >60 mL/min (ref >60)
GFR calc non Af Amer: >60 mL/min (ref >60)
GLUCOSE: 92 mg/dL (ref 65–99)
Potassium: 4 mmol/L (ref 3.5–5.1)
Sodium: 141 mmol/L (ref 135–145)
Total Bilirubin: 0.7 mg/dL (ref 0.3–1.2)
Total Protein: 6.7 g/dL (ref 6.5–8.1)

## 2015-07-11 LAB — CBC
HCT: 34.6 % — ABNORMAL LOW (ref 35.0–47.0)
Hemoglobin: 11.3 g/dL — ABNORMAL LOW (ref 12.0–16.0)
MCH: 30.7 pg (ref 26.0–34.0)
MCHC: 32.6 g/dL (ref 32.0–36.0)
MCV: 94 fL (ref 80.0–100.0)
PLATELETS: 208 10*3/uL (ref 150–440)
RBC: 3.68 MIL/uL — ABNORMAL LOW (ref 3.80–5.20)
RDW: 15.6 % — AB (ref 11.5–14.5)
WBC: 6.3 10*3/uL (ref 3.6–11.0)

## 2015-07-11 LAB — MRSA PCR SCREENING: MRSA BY PCR: POSITIVE — AB

## 2015-07-11 MED ORDER — MORPHINE SULFATE (PF) 2 MG/ML IV SOLN
2.0000 mg | INTRAVENOUS | Status: DC | PRN
  Administered 2015-07-11 – 2015-07-13 (×3): 2 mg via INTRAVENOUS
  Filled 2015-07-11 (×3): qty 1

## 2015-07-11 MED ORDER — ROPINIROLE HCL 0.5 MG PO TABS
0.7500 mg | ORAL_TABLET | Freq: Every day | ORAL | Status: DC
  Administered 2015-07-11: 0.75 mg via ORAL
  Filled 2015-07-11 (×2): qty 1

## 2015-07-11 MED ORDER — NYSTATIN 100000 UNIT/ML MT SUSP
5.0000 mL | Freq: Four times a day (QID) | OROMUCOSAL | Status: DC
Start: 2015-07-11 — End: 2015-07-14
  Administered 2015-07-12 – 2015-07-14 (×10): 500000 [IU] via ORAL
  Filled 2015-07-11 (×22): qty 5

## 2015-07-11 MED ORDER — FAMOTIDINE 20 MG PO TABS
20.0000 mg | ORAL_TABLET | Freq: Two times a day (BID) | ORAL | Status: DC
  Administered 2015-07-11 – 2015-07-14 (×6): 20 mg via ORAL
  Filled 2015-07-11 (×6): qty 1

## 2015-07-11 MED ORDER — MAGNESIUM OXIDE 400 (241.3 MG) MG PO TABS
200.0000 mg | ORAL_TABLET | Freq: Every day | ORAL | Status: DC
  Administered 2015-07-12 – 2015-07-14 (×3): 200 mg via ORAL
  Filled 2015-07-11 (×3): qty 1

## 2015-07-11 MED ORDER — FLUCONAZOLE 100 MG PO TABS
150.0000 mg | ORAL_TABLET | Freq: Once | ORAL | Status: AC
  Administered 2015-07-11: 150 mg via ORAL
  Filled 2015-07-11: qty 2

## 2015-07-11 MED ORDER — BUDESONIDE 0.5 MG/2ML IN SUSP
0.5000 mg | Freq: Two times a day (BID) | RESPIRATORY_TRACT | Status: DC
Start: 2015-07-11 — End: 2015-07-11

## 2015-07-11 MED ORDER — TIOTROPIUM BROMIDE MONOHYDRATE 18 MCG IN CAPS
18.0000 ug | ORAL_CAPSULE | Freq: Every day | RESPIRATORY_TRACT | Status: DC
  Administered 2015-07-12 – 2015-07-14 (×3): 18 ug via RESPIRATORY_TRACT
  Filled 2015-07-11: qty 5

## 2015-07-11 MED ORDER — ONDANSETRON HCL 4 MG PO TABS: 4.0000 mg | ORAL_TABLET | Freq: Four times a day (QID) | ORAL | Status: DC | PRN

## 2015-07-11 MED ORDER — ACETAMINOPHEN 325 MG PO TABS: 650.0000 mg | ORAL_TABLET | Freq: Four times a day (QID) | ORAL | Status: DC | PRN

## 2015-07-11 MED ORDER — LOPERAMIDE HCL 2 MG PO CAPS: 2.0000 mg | ORAL_CAPSULE | ORAL | Status: DC | PRN

## 2015-07-11 MED ORDER — MORPHINE SULFATE (PF) 4 MG/ML IV SOLN
4.0000 mg | Freq: Once | INTRAVENOUS | Status: AC
  Administered 2015-07-11: 4 mg via INTRAVENOUS
  Filled 2015-07-11: qty 1

## 2015-07-11 MED ORDER — PREGABALIN 75 MG PO CAPS
150.0000 mg | ORAL_CAPSULE | Freq: Two times a day (BID) | ORAL | Status: DC
  Administered 2015-07-11 – 2015-07-14 (×6): 150 mg via ORAL
  Filled 2015-07-11 (×6): qty 2

## 2015-07-11 MED ORDER — FLUTICASONE PROPIONATE 50 MCG/ACT NA SUSP
2.0000 | Freq: Every day | NASAL | Status: DC | PRN
  Filled 2015-07-11: qty 16

## 2015-07-11 MED ORDER — ONDANSETRON HCL 4 MG/2ML IJ SOLN
INTRAMUSCULAR | Status: AC
  Administered 2015-07-11: 4 mg via INTRAVENOUS
  Filled 2015-07-11: qty 2

## 2015-07-11 MED ORDER — IPRATROPIUM-ALBUTEROL 0.5-2.5 (3) MG/3ML IN SOLN
3.0000 mL | Freq: Four times a day (QID) | RESPIRATORY_TRACT | Status: DC | PRN
  Administered 2015-07-13 – 2015-07-14 (×2): 3 mL via RESPIRATORY_TRACT
  Filled 2015-07-11 (×2): qty 3

## 2015-07-11 MED ORDER — ONDANSETRON HCL 4 MG/2ML IJ SOLN
4.0000 mg | Freq: Once | INTRAMUSCULAR | Status: AC
  Administered 2015-07-11: 4 mg via INTRAVENOUS

## 2015-07-11 MED ORDER — VITAMIN D 1000 UNITS PO TABS
1000.0000 [IU] | ORAL_TABLET | Freq: Every day | ORAL | Status: DC
  Administered 2015-07-12 – 2015-07-14 (×3): 1000 [IU] via ORAL
  Filled 2015-07-11 (×3): qty 1

## 2015-07-11 MED ORDER — CITALOPRAM HYDROBROMIDE 20 MG PO TABS
20.0000 mg | ORAL_TABLET | Freq: Every day | ORAL | Status: DC
  Administered 2015-07-12 – 2015-07-14 (×3): 20 mg via ORAL
  Filled 2015-07-11 (×3): qty 1

## 2015-07-11 MED ORDER — BUDESONIDE 0.5 MG/2ML IN SUSP
0.5000 mg | Freq: Two times a day (BID) | RESPIRATORY_TRACT | Status: DC
  Administered 2015-07-11 – 2015-07-12 (×2): 0.5 mg via RESPIRATORY_TRACT
  Filled 2015-07-11 (×2): qty 2

## 2015-07-11 MED ORDER — HYDROCODONE-ACETAMINOPHEN 5-325 MG PO TABS
1.0000 | ORAL_TABLET | Freq: Three times a day (TID) | ORAL | Status: DC | PRN
  Administered 2015-07-11 – 2015-07-12 (×3): 1 via ORAL
  Filled 2015-07-11 (×4): qty 1

## 2015-07-11 MED ORDER — MORPHINE SULFATE (PF) 4 MG/ML IV SOLN
4.0000 mg | Freq: Once | INTRAVENOUS | Status: AC
  Administered 2015-07-11: 4 mg via INTRAVENOUS

## 2015-07-11 MED ORDER — COLESEVELAM HCL 625 MG PO TABS
625.0000 mg | ORAL_TABLET | Freq: Two times a day (BID) | ORAL | Status: DC
  Administered 2015-07-12 – 2015-07-14 (×5): 625 mg via ORAL
  Filled 2015-07-11 (×8): qty 1

## 2015-07-11 MED ORDER — MORPHINE SULFATE (PF) 4 MG/ML IV SOLN
INTRAVENOUS | Status: AC
  Administered 2015-07-11: 4 mg via INTRAVENOUS
  Filled 2015-07-11: qty 1

## 2015-07-11 MED ORDER — OXYBUTYNIN CHLORIDE 5 MG PO TABS
5.0000 mg | ORAL_TABLET | Freq: Every day | ORAL | Status: DC
  Administered 2015-07-12 – 2015-07-14 (×3): 5 mg via ORAL
  Filled 2015-07-11 (×3): qty 1

## 2015-07-11 MED ORDER — HEPARIN SODIUM (PORCINE) 5000 UNIT/ML IJ SOLN
5000.0000 [IU] | Freq: Three times a day (TID) | INTRAMUSCULAR | Status: DC
  Administered 2015-07-11 – 2015-07-14 (×8): 5000 [IU] via SUBCUTANEOUS
  Filled 2015-07-11 (×8): qty 1

## 2015-07-11 MED ORDER — NYSTATIN 100000 UNIT/GM EX POWD
Freq: Two times a day (BID) | CUTANEOUS | Status: DC
  Administered 2015-07-11 – 2015-07-13 (×5): via TOPICAL
  Administered 2015-07-13: 1 via TOPICAL
  Administered 2015-07-14: 09:00:00 via TOPICAL
  Filled 2015-07-11: qty 30

## 2015-07-11 MED ORDER — NYSTATIN 100000 UNIT/GM EX POWD: Freq: Two times a day (BID) | CUTANEOUS | Status: DC

## 2015-07-11 MED ORDER — MAGNESIUM 250 MG PO TABS: 250.0000 mg | ORAL_TABLET | Freq: Every day | ORAL | Status: DC

## 2015-07-11 MED ORDER — HEPARIN SODIUM (PORCINE) 5000 UNIT/ML IJ SOLN: 5000.0000 [IU] | Freq: Three times a day (TID) | INTRAMUSCULAR | Status: DC

## 2015-07-11 MED ORDER — FLUTICASONE FUROATE-VILANTEROL 100-25 MCG/INH IN AEPB
1.0000 | INHALATION_SPRAY | Freq: Every day | RESPIRATORY_TRACT | Status: DC
  Administered 2015-07-12 – 2015-07-14 (×4): 1 via RESPIRATORY_TRACT
  Filled 2015-07-11 (×2): qty 28

## 2015-07-11 MED ORDER — ONDANSETRON HCL 4 MG/2ML IJ SOLN: 4.0000 mg | Freq: Four times a day (QID) | INTRAMUSCULAR | Status: DC | PRN

## 2015-07-11 MED ORDER — PANTOPRAZOLE SODIUM 40 MG PO TBEC
40.0000 mg | DELAYED_RELEASE_TABLET | Freq: Every day | ORAL | Status: DC
  Administered 2015-07-12 – 2015-07-14 (×3): 40 mg via ORAL
  Filled 2015-07-11 (×3): qty 1

## 2015-07-11 MED ORDER — FLUCONAZOLE 100 MG PO TABS
150.0000 mg | ORAL_TABLET | Freq: Every day | ORAL | Status: AC
  Administered 2015-07-12 – 2015-07-13 (×2): 150 mg via ORAL
  Filled 2015-07-11 (×2): qty 2

## 2015-07-11 MED ORDER — GUAIFENESIN-DM 100-10 MG/5ML PO SYRP: 5.0000 mL | ORAL_SOLUTION | ORAL | Status: DC | PRN

## 2015-07-11 MED ORDER — PROPRANOLOL HCL ER 80 MG PO CP24
80.0000 mg | ORAL_CAPSULE | Freq: Every day | ORAL | Status: DC
  Administered 2015-07-12 – 2015-07-14 (×3): 80 mg via ORAL
  Filled 2015-07-11 (×3): qty 1

## 2015-07-11 MED ORDER — FUROSEMIDE 40 MG PO TABS
40.0000 mg | ORAL_TABLET | Freq: Two times a day (BID) | ORAL | Status: DC
  Administered 2015-07-12: 20 mg via ORAL
  Filled 2015-07-11: qty 1

## 2015-07-11 NOTE — ED Notes (Signed)
Dr Tressia Miners at bedside

## 2015-07-11 NOTE — ED Notes (Signed)
Patient arrives VIA [facility] from [facility] with complaint of fall/Bilateral hip pain. EMS reports shortening and rotation to the left hip. 138mcg fentanyl given in route total (12mcg IN, 24mcg IV)

## 2015-07-11 NOTE — ED Provider Notes (Signed)
[facility] Emergency Department Provider Note  ____________________________________________    I have reviewed the triage vital signs and the nursing notes.   HISTORY  Chief Complaint Hip Pain    HPI Loretta Wade is a 65 y.o. female who presents after a fall. Patient reportedly fell while trying to transfer. She complains of pain in her bilateral hips and in her lower back. No numbness or weakness. No head injury. No neck pain.     Past Medical History  Diagnosis Date  . OA (osteoarthritis)   . Fibromyalgia   . Anemia   . Sleep apnea   . Fatigue   . Edema   . Esophageal reflux   . Nocturia   . Hypertension   . COPD (chronic obstructive pulmonary disease) [facility])     Patient Active Problem List   Diagnosis Date Noted  . Dyslipidemia 06/17/2015  . Overflow stress incontinence of urine in female 06/17/2015  . Acute sinusitis treated with antibiotics in the past 60 days 06/17/2015  . Chronic pain 06/10/2015  . Chronic low back pain (Location of Tertiary source of pain) (Bilateral) (R>L) 06/10/2015  . Chronic shoulder pain (Location of Secondary source of pain) (Left) 06/10/2015  . Costochondritis (Location of Primary Source of Pain) 06/10/2015  . Long term current use of opiate analgesic 06/10/2015  . Long term prescription opiate use 06/10/2015  . Opiate use 06/10/2015  . Encounter for therapeutic drug level monitoring 06/10/2015  . Encounter for pain management planning 06/10/2015  . Chronic feet pain (Bilateral) (R>L) 06/10/2015  . Osteoarthritis 06/10/2015  . Osteoarthritis of shoulder (Left) 06/10/2015  . COPD exacerbation ([place]) 04/12/2015  . Poor mobility 04/12/2015  . Fibromyalgia 03/23/2015  . Esophageal reflux 03/23/2015  . MRSA infection 11/17/2014  . Diastolic CHF, acute on chronic (HCC) 11/12/2014  . Morbid obesity with BMI of 50.0-59.9, adult ([place]) 11/12/2014  . Neck pain, chronic 11/12/2014  . COPD, frequent  exacerbations ([place]) 11/04/2014    Past Surgical History  Procedure Laterality Date  . Cholecystectomy    . Total knee arthroplasty      right  . Vesicovaginal fistula closure w/ tah    . Abdominal hysterectomy      Current Outpatient Rx  Name  Route  Sig  Dispense  Refill  . acetaminophen (TYLENOL) 325 MG tablet   Oral   Take 2 tablets (650 mg total) by mouth every 6 (six) hours as needed for mild pain (or Fever >/= 101).   30 tablet   0   . albuterol (PROVENTIL HFA;VENTOLIN HFA) 108 (90 Base) MCG/ACT inhaler   Inhalation   Inhale 2 puffs into the lungs every 6 (six) hours as needed for wheezing or shortness of breath.         . budesonide (PULMICORT) 0.5 MG/2ML nebulizer solution   Nebulization   Take 0.5 mg by nebulization 2 (two) times daily.          . Chlorphen-Phenyleph-APAP (CORICIDIN D COLD/FLU/SINUS) 2-5-325 MG TABS   Oral   Take 2 tablets by mouth daily as needed (for nasal congestion/cough).         . cholecalciferol (VITAMIN D) 1000 UNITS tablet   Oral   Take 1,000 Units by mouth daily.          . ciprofloxacin (CIPRO) 500 MG tablet   Oral   Take 1 tablet (500 mg total) by mouth 2 (two) times daily.   14 tablet   0   . citalopram (  CELEXA) 20 MG tablet   Oral   Take 20 mg by mouth daily.         . colesevelam (WELCHOL) 625 MG tablet   Oral   Take 1 tablet (625 mg total) by mouth 2 (two) times daily with a meal.   60 tablet   2   . dexlansoprazole (DEXILANT) 60 MG capsule   Oral   Take 1 capsule (60 mg total) by mouth daily.   90 capsule   0   . fluticasone (FLONASE) 50 MCG/ACT nasal spray   Each Nare   Place 2 sprays into both nostrils daily as needed for rhinitis.         . fluticasone furoate-vilanterol (BREO ELLIPTA) 100-25 MCG/INH AEPB   Inhalation   Inhale 1 puff into the lungs daily.   28 each   2   . furosemide (LASIX) 40 MG tablet   Oral   Take 1 tablet (40 mg total) by mouth 2 (two) times daily.   30 tablet   60    . guaiFENesin-dextromethorphan (ROBITUSSIN DM) 100-10 MG/5ML syrup   Oral   Take 5-10 mLs by mouth every 4 (four) hours as needed for cough.          Marland Kitchen HYDROcodone-acetaminophen (NORCO/VICODIN) 5-325 MG tablet   Oral   Take 1 tablet by mouth every 8 (eight) hours as needed for moderate pain.   90 tablet   0   . ipratropium-albuterol (DUONEB) 0.5-2.5 (3) MG/3ML SOLN   Nebulization   Take 3 mLs by nebulization every 6 (six) hours as needed (for wheezing/shortness of breath).         Marland Kitchen levofloxacin (LEVAQUIN) 500 MG tablet   Oral   Take 1 tablet (500 mg total) by mouth daily.   10 tablet   0   . loperamide (IMODIUM) 2 MG capsule   Oral   Take 2-4 mg by mouth as needed for diarrhea or loose stools.         . Magnesium 250 MG TABS   Oral   Take 250 mg by mouth daily.         Marland Kitchen nystatin (MYCOSTATIN) 100000 UNIT/ML suspension   Oral   Take 5 mLs (500,000 Units total) by mouth 4 (four) times daily. For 10 days   240 mL   0   . oxybutynin (DITROPAN) 5 MG tablet   Oral   Take 1 tablet (5 mg total) by mouth daily.   30 tablet   2   . pregabalin (LYRICA) 150 MG capsule   Oral   Take 1 capsule (150 mg total) by mouth 2 (two) times daily.   60 capsule   0   . propranolol ER (INDERAL LA) 80 MG 24 hr capsule   Oral   Take 80 mg by mouth daily.         . propranolol ER (INDERAL LA) 80 MG 24 hr capsule   Oral   Take 1 capsule (80 mg total) by mouth daily.   90 capsule   0   . ranitidine (ZANTAC) 150 MG tablet   Oral   Take 150 mg by mouth 3 (three) times daily as needed for heartburn.         Marland Kitchen rOPINIRole (REQUIP) 0.25 MG tablet   Oral   Take 3 tablets (0.75 mg total) by mouth at bedtime.   90 tablet   2   . tiotropium (SPIRIVA) 18 MCG inhalation capsule   Inhalation  Place 1 capsule (18 mcg total) into inhaler and inhale daily.   30 capsule   2     Allergies Review of patient's allergies indicates no known allergies.  Family History  Problem  Relation Age of Onset  . Hypertension Mother   . Diabetes    . Breast cancer Daughter     Social History Social History  Substance Use Topics  . Smoking status: Former Smoker    Quit date: 11/04/1983  . Smokeless tobacco: None  . Alcohol Use: No    Review of Systems  Constitutional: Negative for fever. Eyes: Negative for redness ENT: Negative for sore throat, No neck pain Cardiovascular: Negative for chest pain Respiratory: Negative for shortness of breath. Gastrointestinal: Negative for abdominal pain Genitourinary: Negative for dysuria. Musculoskeletal: Low back pain and hip pain bilaterally Skin: Negative for abrasion or laceration Neurological: Negative for focal weakness, no headache  Psychiatric: no anxiety    ____________________________________________   PHYSICAL EXAM:  VITAL SIGNS: ED Triage Vitals  Enc Vitals Group     BP 07/11/15 1402 121/68 mmHg     Pulse Rate 07/11/15 1402 60     Resp 07/11/15 1402 18     Temp 07/11/15 1402 98.7 F (37.1 C)     Temp Source 07/11/15 1402 Oral     SpO2 07/11/15 1402 93 %     Weight 07/11/15 1402 331 lb 4.8 oz (150.277 kg)     Height 07/11/15 1402 5\' 4"  (1.626 m)     Head Cir --      Peak Flow --      Pain Score 07/11/15 1404 8     Pain Loc --      Pain Edu? --      Excl. in [place]? --      Constitutional: Alert and oriented.  Eyes: Conjunctivae are normal. No erythema or injection ENT   Head: Normocephalic and atraumatic.   Mouth/Throat: Mucous membranes are moist. Cardiovascular: Normal rate, regular rhythm. Normal and symmetric distal pulses are present in the upper extremities.  Respiratory: Normal respiratory effort without tachypnea nor retractions. Breath sounds are clear and equal bilaterally.  Gastrointestinal: Soft and non-tender in all quadrants. No distention. There is no CVA tenderness. Genitourinary: deferred Musculoskeletal: Pain in bilateral hips with any movement. Pain with axial load of  the hips. Tenderness to palpation bilateral hips laterally. No abdominal pain. Knee joint appears normal bilaterally. 2+ distal pulses Neurologic:  Normal speech and language. No gross focal neurologic deficits are appreciated. Skin:  Skin is warm, dry and intact. No rash noted. Psychiatric: Mood and affect are normal. Patient exhibits appropriate insight and judgment.  ____________________________________________    LABS (pertinent positives/negatives)  Labs Reviewed  CBC  COMPREHENSIVE METABOLIC PANEL    ____________________________________________   EKG  None  ____________________________________________    RADIOLOGY  X-rays do not show fracture  ____________________________________________   PROCEDURES  Procedure(s) performed: none  Critical Care performed: none  ____________________________________________   INITIAL IMPRESSION / ASSESSMENT AND PLAN / ED COURSE  Pertinent labs & imaging results that were available during my care of the patient were reviewed by me and considered in my medical decision making (see chart for details).  Patient results of her fall with complaints of bilateral hip pain. She also complains of low back pain. We will image these areas, give IV pain medication and reevaluate.  ----------------------------------------- 3:25 PM on 07/11/2015 -----------------------------------------  Patient is unable to stand secondary to continued pain. She reports her bed  is broken at [facility] and she has no assistance to get to the commode. We will give an additional dose of IV pain medication and I suspect patient will require admission for eval by physical therapy  ____________________________________________   FINAL CLINICAL IMPRESSION(S) / ED DIAGNOSES  Final diagnoses:  Fall with injury          Lavonia Drafts, MD 07/11/15 1535

## 2015-07-11 NOTE — H&P (Signed)
[place] [facility] [place] NAME: Loretta Wade    MR#:  [id number]  DATE OF BIRTH:  03-Jun-1950  DATE OF ADMISSION:  07/11/2015  PRIMARY CARE PHYSICIAN: Keith Rake, MD   REQUESTING/REFERRING PHYSICIAN: Dr. Lavonia Drafts  CHIEF COMPLAINT:   Chief Complaint  Patient presents with  . Hip Pain    HISTORY OF PRESENT ILLNESS:  Loretta Wade  is a 65 y.o. female with a known history of obesity obesity, arthritis, fibromyalgia, COPD and sleep apnea on 2 L home oxygen, GERD, hypertension diastolic CHF presents to the hospital secondary to a fall at the assisted living facility. Patient ambulates with a walker and has also has a wheelchair. Today she was trying to get from her wheelchair to her bed and felt that she was unsteady and fell onto the floor on her right side. Denies hitting the head. Denies losing consciousness. Denies any fevers or chills. Was in the emergency room recently for dysuria and was started on ciprofloxacin. States that her symptoms haven't improved since being on Cipro. Denies any vomiting or abdominal pain. Her right-sided body aches with hip hurting radiating pain down her leg and also right shoulder and right side of the abdomen. No bruising noted. Patient has been seeing pain management clinic for her fibromyalgia symptoms. She was followed by hospice in the past and has been discharged from their services from January 2017. Labs do not show any acute abnormality. Patient couldn't get back to her baseline due to significant pain and is being admitted under observation. Urine analysis is pending at this time.  PAST MEDICAL HISTORY:   Past Medical History  Diagnosis Date  . OA (osteoarthritis)   . Fibromyalgia     Following with pain management  . Anemia   . Sleep apnea     not on CPAP  . Fatigue   . Edema   . Esophageal reflux   . Nocturia   . Hypertension   . COPD (chronic obstructive pulmonary disease) (HCC)     on 2L home o2  . CHF (congestive heart failure) ([place])     PAST SURGICAL HISTORY:   Past Surgical History  Procedure Laterality Date  . Cholecystectomy    . Total knee arthroplasty      right  . Vesicovaginal fistula closure w/ tah    . Abdominal hysterectomy      SOCIAL HISTORY:   Social History  Substance Use Topics  . Smoking status: Former Smoker    Quit date: 11/04/1983  . Smokeless tobacco: Not on file     Comment: quit several years ago - almost 30 years ago  . Alcohol Use: No    FAMILY HISTORY:   Family History  Problem Relation Age of Onset  . Hypertension Mother   . Diabetes    . Breast cancer Daughter     DRUG ALLERGIES:  No Known Allergies  REVIEW OF SYSTEMS:   Review of Systems  Constitutional: Positive for malaise/fatigue. Negative for fever, chills and weight loss.  HENT: Negative for ear discharge, ear pain, nosebleeds and tinnitus.   Eyes: Positive for blurred vision. Negative for double vision and photophobia.       Right eye blurred vision  Respiratory: Positive for shortness of breath. Negative for cough, hemoptysis and wheezing.   Cardiovascular: Positive for leg swelling. Negative for chest pain, palpitations and orthopnea.  Gastrointestinal: Positive for nausea. Negative for heartburn, vomiting, abdominal pain, diarrhea, constipation and melena.  Genitourinary: Positive for dysuria. Negative for urgency.  Musculoskeletal: Positive for myalgias, back pain and joint pain. Negative for neck pain.  Skin: Negative for rash.  Neurological: Negative for dizziness, tremors, sensory change, speech change, focal weakness and headaches.  Endo/Heme/Allergies: Does not bruise/bleed easily.  Psychiatric/Behavioral: Negative for depression.    MEDICATIONS AT HOME:   Prior to Admission medications   Medication Sig Start Date End Date Taking? Authorizing Provider  acetaminophen (TYLENOL) 325 MG tablet Take 2 tablets (650 mg total) by mouth every 6  (six) hours as needed for mild pain (or Fever >/= 101). 11/17/14   Gladstone Lighter, MD  albuterol (PROVENTIL HFA;VENTOLIN HFA) 108 (90 Base) MCG/ACT inhaler Inhale 2 puffs into the lungs every 6 (six) hours as needed for wheezing or shortness of breath.    Historical Provider, MD  budesonide (PULMICORT) 0.5 MG/2ML nebulizer solution Take 0.5 mg by nebulization 2 (two) times daily.     Historical Provider, MD  Chlorphen-Phenyleph-APAP (CORICIDIN D COLD/FLU/SINUS) 2-5-325 MG TABS Take 2 tablets by mouth daily as needed (for nasal congestion/cough).    Historical Provider, MD  cholecalciferol (VITAMIN D) 1000 UNITS tablet Take 1,000 Units by mouth daily.     Historical Provider, MD  ciprofloxacin (CIPRO) 500 MG tablet Take 1 tablet (500 mg total) by mouth 2 (two) times daily. 06/21/15   Lavonia Drafts, MD  citalopram (CELEXA) 20 MG tablet Take 20 mg by mouth daily.    Historical Provider, MD  colesevelam (WELCHOL) 625 MG tablet Take 1 tablet (625 mg total) by mouth 2 (two) times daily with a meal. 06/17/15   Roselee Nova, MD  dexlansoprazole (DEXILANT) 60 MG capsule Take 1 capsule (60 mg total) by mouth daily. 06/03/15   Roselee Nova, MD  fluticasone (FLONASE) 50 MCG/ACT nasal spray Place 2 sprays into both nostrils daily as needed for rhinitis.    Historical Provider, MD  fluticasone furoate-vilanterol (BREO ELLIPTA) 100-25 MCG/INH AEPB Inhale 1 puff into the lungs daily. 05/19/15   Roselee Nova, MD  furosemide (LASIX) 40 MG tablet Take 1 tablet (40 mg total) by mouth 2 (two) times daily. 02/08/15   Dustin Flock, MD  guaiFENesin-dextromethorphan (ROBITUSSIN DM) 100-10 MG/5ML syrup Take 5-10 mLs by mouth every 4 (four) hours as needed for cough.     Historical Provider, MD  HYDROcodone-acetaminophen (NORCO/VICODIN) 5-325 MG tablet Take 1 tablet by mouth every 8 (eight) hours as needed for moderate pain. 06/17/15   Roselee Nova, MD  ipratropium-albuterol (DUONEB) 0.5-2.5 (3) MG/3ML SOLN Take 3  mLs by nebulization every 6 (six) hours as needed (for wheezing/shortness of breath).    Historical Provider, MD  levofloxacin (LEVAQUIN) 500 MG tablet Take 1 tablet (500 mg total) by mouth daily. 06/17/15   Roselee Nova, MD  loperamide (IMODIUM) 2 MG capsule Take 2-4 mg by mouth as needed for diarrhea or loose stools.    Historical Provider, MD  Magnesium 250 MG TABS Take 250 mg by mouth daily.    Historical Provider, MD  nystatin (MYCOSTATIN) 100000 UNIT/ML suspension Take 5 mLs (500,000 Units total) by mouth 4 (four) times daily. For 10 days 06/17/15   Roselee Nova, MD  oxybutynin (DITROPAN) 5 MG tablet Take 1 tablet (5 mg total) by mouth daily. 06/17/15   Roselee Nova, MD  pregabalin (LYRICA) 150 MG capsule Take 1 capsule (150 mg total) by mouth 2 (two) times daily. 06/29/15   Roselee Nova,  MD  propranolol ER (INDERAL LA) 80 MG 24 hr capsule Take 80 mg by mouth daily.    Historical Provider, MD  propranolol ER (INDERAL LA) 80 MG 24 hr capsule Take 1 capsule (80 mg total) by mouth daily. 05/28/15   Roselee Nova, MD  ranitidine (ZANTAC) 150 MG tablet Take 150 mg by mouth 3 (three) times daily as needed for heartburn.    Historical Provider, MD  rOPINIRole (REQUIP) 0.25 MG tablet Take 3 tablets (0.75 mg total) by mouth at bedtime. 01/26/15   Roselee Nova, MD  tiotropium (SPIRIVA) 18 MCG inhalation capsule Place 1 capsule (18 mcg total) into inhaler and inhale daily. 05/19/15   Roselee Nova, MD      VITAL SIGNS:  Blood pressure 112/59, pulse 66, temperature 98.7 F (37.1 C), temperature source Oral, resp. rate 22, height 5\' 4"  (1.626 m), weight 150.277 kg (331 lb 4.8 oz), SpO2 97 %.  PHYSICAL EXAMINATION:   Physical Exam  GENERAL:  65 y.o.-year-old morbidly obese patient lying in the bed with no acute distress.  EYES: Pupils equal, round, reactive to light and accommodation. Right pupil sluggish reaction to light. No scleral icterus. Extraocular muscles intact.  HEENT:  Head atraumatic, normocephalic. Oropharynx and nasopharynx clear.  NECK:  Supple, no jugular venous distention. No thyroid enlargement, no tenderness.  LUNGS: Normal breath sounds bilaterally, no rales,rhonchi or crepitation. No use of accessory muscles of respiration. Scattered wheezing at the bases CARDIOVASCULAR: S1, S2 normal. No murmurs, rubs, or gallops.  ABDOMEN: Soft, obese, nontender, nondistended. Bowel sounds present. No organomegaly or mass.  EXTREMITIES: No cyanosis, or clubbing. 2+ pedal edema noted. NEUROLOGIC: Cranial nerves II through XII are intact. Motor strength in lower extremities is 3/5 and 5/5 upper extremities. Gait not checked. Both hips hurt on moving. PSYCHIATRIC: The patient is alert and oriented x 3.  SKIN: No obvious rash, lesion, or ulcer.   LABORATORY PANEL:   CBC  Recent Labs Lab 07/11/15 1408  WBC 6.3  HGB 11.3*  HCT 34.6*  PLT 208   ------------------------------------------------------------------------------------------------------------------  Chemistries   Recent Labs Lab 07/11/15 1408  NA 141  K 4.0  CL 103  CO2 31  GLUCOSE 92  BUN 13  CREATININE 0.71  CALCIUM 9.1  AST 20  ALT 19  ALKPHOS 100  BILITOT 0.7   ------------------------------------------------------------------------------------------------------------------  Cardiac Enzymes No results for input(s): TROPONINI in the last 168 hours. ------------------------------------------------------------------------------------------------------------------  RADIOLOGY:  Dg Lumbar Spine 2-3 Views  07/11/2015  CLINICAL DATA:  Pain following fall EXAM: LUMBAR SPINE - 2-3 VIEW COMPARISON:  None. FINDINGS: Frontal, lateral, and spot lumbosacral lateral images were obtained. There are 5 non-rib-bearing lumbar type vertebral bodies. There is mild levoscoliosis. There is no fracture or spondylolisthesis. There is moderate disc space narrowing at L4-5. There is slightly milder disc  space narrowing at L2-3, L3-4, and L5-S1. There are prominent anterior osteophytes at L3, L4, L5. No erosive change. IMPRESSION: Negative. Osteoarthritic change, most notably at L4-5. Mild scoliosis. No fracture or spondylolisthesis. Electronically Signed   By: Lowella Grip III M.D.   On: 07/11/2015 15:13   Dg Hips Bilat With Pelvis 3-4 Views  07/11/2015  CLINICAL DATA:  Pain following fall EXAM: DG HIP (WITH OR WITHOUT PELVIS) 3-4V BILAT COMPARISON:  None. FINDINGS: Frontal pelvis as well as frontal and lateral views of each hip-total five views- were obtained. There is no demonstrable fracture or dislocation. There is slight symmetric narrowing of both hip joints. No  erosive change. IMPRESSION: Slight symmetric narrowing of both hip joints. No fracture or dislocation. Electronically Signed   By: Lowella Grip III M.D.   On: 07/11/2015 15:09    EKG:   Orders placed or performed during the hospital encounter of 05/12/15  . EKG 12-Lead  . EKG 12-Lead  . ED EKG within 10 minutes  . ED EKG within 10 minutes  . EKG    IMPRESSION AND PLAN:   Yaneira Morphy  is a 65 y.o. female with a known history of obesity obesity, arthritis, fibromyalgia, COPD and sleep apnea on 2 L home oxygen, GERD, hypertension diastolic CHF presents to the hospital secondary to a fall at the assisted living facility.  #1 fall and right-sided pain-muscular pain secondary to the fall. -X-ray with no fractures. Admit for pain control. She is admitted under observation. -Continue her home oral pain medicines and added morphine IV for severe pain. -Physical therapy consult. Patient is being followed by home health services.  #2 COPD and OSA-does not use CPAP at bedtime. Continue oxygen support. -Continue inhalers. Neck is indication for steroids systemically at this time.  #3 chronic pain-secondary to fibromyalgia and arthritis. Follows with pain management clinic. Continue home medications including lyrica and  Norco  #4 diastolic CHF-continue Lasix. -Check potassium in the morning  #5 GERD-on Protonix  #6 dysuria-check urine analysis.  #7 DVT prophylaxis-on subcutaneous heparin   Physical therapy and care management consults.    All the records are reviewed and case discussed with ED provider. Management plans discussed with the patient, family and they are in agreement.  CODE STATUS: Full Code  TOTAL TIME TAKING CARE OF THIS PATIENT: 50 minutes.    Gladstone Lighter M.D on 07/11/2015 at 4:08 PM  Between 7am to 6pm - Pager - [phone]  After 6pm go to [email or web address] - password [email or web address] [facility]  [facility]  Office  [phone]  CC: Primary care physician; Keith Rake, MD

## 2015-07-11 NOTE — Care Management Obs Status (Signed)
[place] NOTIFICATION   Patient Details  Name: Loretta Wade MRN: [id number] Date of Birth: 06-Dec-1950   Medicare Observation Status Notification Given:  Yes (pain)    Ival Bible, RN 07/11/2015, 4:34 PM

## 2015-07-11 NOTE — ED Notes (Signed)
MD at bedside. 

## 2015-07-11 NOTE — ED Notes (Signed)
Pt used bedpan, pericare performed and brief changed.

## 2015-07-11 NOTE — Progress Notes (Signed)
rn spoke with dr Tressia Miners re: significant  Yeast to perineum,rectum and skin folds. md orders diflucan 150mg  po qdx2 doses  And nystatin topical bid

## 2015-07-11 NOTE — ED Notes (Signed)
Patient transported to X-ray 

## 2015-07-12 ENCOUNTER — Observation Stay: Payer: Medicare PPO

## 2015-07-12 DIAGNOSIS — M25551 Pain in right hip: Secondary | ICD-10-CM | POA: Diagnosis not present

## 2015-07-12 LAB — CBC
HCT: 34.9 % — ABNORMAL LOW (ref 35.0–47.0)
HEMOGLOBIN: 11.4 g/dL — AB (ref 12.0–16.0)
MCH: 30.5 pg (ref 26.0–34.0)
MCHC: 32.6 g/dL (ref 32.0–36.0)
MCV: 93.6 fL (ref 80.0–100.0)
Platelets: 214 10*3/uL (ref 150–440)
RBC: 3.73 MIL/uL — AB (ref 3.80–5.20)
RDW: 15.6 % — ABNORMAL HIGH (ref 11.5–14.5)
WBC: 5 10*3/uL (ref 3.6–11.0)

## 2015-07-12 LAB — BASIC METABOLIC PANEL
ANION GAP: 7 (ref 5–15)
BUN: 13 mg/dL (ref 6–20)
CALCIUM: 9.2 mg/dL (ref 8.9–10.3)
CO2: 32 mmol/L (ref 22–32)
Chloride: 104 mmol/L (ref 101–111)
Creatinine, Ser: 0.74 mg/dL (ref 0.44–1.00)
GFR calc non Af Amer: >60 mL/min (ref >60)
Glucose, Bld: 104 mg/dL — ABNORMAL HIGH (ref 65–99)
Potassium: 4.1 mmol/L (ref 3.5–5.1)
Sodium: 143 mmol/L (ref 135–145)

## 2015-07-12 MED ORDER — ROPINIROLE HCL 0.25 MG PO TABS
0.7500 mg | ORAL_TABLET | Freq: Every day | ORAL | Status: DC
  Administered 2015-07-12 – 2015-07-13 (×2): 0.75 mg via ORAL
  Filled 2015-07-12 (×3): qty 3

## 2015-07-12 MED ORDER — FUROSEMIDE 20 MG PO TABS
20.0000 mg | ORAL_TABLET | Freq: Two times a day (BID) | ORAL | Status: DC
  Administered 2015-07-13 – 2015-07-14 (×2): 20 mg via ORAL
  Filled 2015-07-12 (×4): qty 1

## 2015-07-12 MED ORDER — HYDROCODONE-ACETAMINOPHEN 5-325 MG PO TABS
1.0000 | ORAL_TABLET | ORAL | Status: DC | PRN
  Administered 2015-07-12 – 2015-07-14 (×7): 1 via ORAL
  Filled 2015-07-12 (×7): qty 1

## 2015-07-12 MED ORDER — MUPIROCIN 2 % EX OINT
1.0000 "application " | TOPICAL_OINTMENT | Freq: Two times a day (BID) | CUTANEOUS | Status: DC
  Administered 2015-07-12 – 2015-07-14 (×6): 1 via NASAL
  Filled 2015-07-12: qty 22

## 2015-07-12 MED ORDER — CHLORHEXIDINE GLUCONATE CLOTH 2 % EX PADS
6.0000 | MEDICATED_PAD | Freq: Every day | CUTANEOUS | Status: DC
  Administered 2015-07-13 – 2015-07-14 (×2): 6 via TOPICAL

## 2015-07-12 NOTE — Consult Note (Signed)
[facility] Inpatient Consult   07/12/2015  Sallye Henner [facility] 1950-06-16 [id number]   Patient screened for potential [facility] Care Management services. Patient is eligible for [facility] Care [facility] Services. Electronic medical record reveals patient's discharge plan is SNFand has been recently followed by palliative in home care. [facility] Care Management services not appropriate at this time. If patient's post hospital needs change please place a [facility] Care Management consult. For questions please contact:   Kynzlie Hilleary RN, BSN [facility] Liaison  [phone]) Business Mobile [phone]) Toll free office

## 2015-07-12 NOTE — Care Management Note (Addendum)
Case Management Note  Patient Details  Name: Loretta Wade MRN: [id number] Date of Birth: July 03, 1950  Subjective/Objective:                  Met with patient as I assisted Nurse Tech to help patient on bed pan to void. Patient rolled well with assistance to place bed pan. She is unsure if she can ride in a car however, she states her "[name] fell on her". She states she is followed by [facility] and per Santiago Glad with [facility] she is also followed by Palliative in the home. She is COPD Gold. She is on chronic O2 through [place]. Lives alone.   Action/Plan:  RNCM will continue to follow.   Expected Discharge Date:                  Expected Discharge Plan:     In-House Referral:     Discharge planning Services  CM Consult  Post Acute Care Choice:  Home Health (Palliative) Choice offered to:  Patient  DME Arranged:    DME Agency:     HH Arranged:    [place] Agency:     Status of Service:  In process, will continue to follow  Medicare Important Message Given:    Date Medicare IM Given:    Medicare IM give by:    Date Additional Medicare IM Given:    Additional Medicare Important Message give by:     If discussed at [place] of Stay Meetings, dates discussed:    Additional Comments:  Marshell Garfinkel, RN 07/12/2015, 9:33 AM

## 2015-07-12 NOTE — Progress Notes (Signed)
Pt would only takes 20mg  of lasix (40 scheduled). She said that she only takes 40 when she needs it.  Pt was unable to stand with PT.  She was having a lot of pain.  Dr Posey Pronto notified of this

## 2015-07-12 NOTE — Clinical Social Work Note (Signed)
Clinical Social Work Assessment  Patient Details  Name: Loretta Wade MRN: [id number] Date of Birth: 02/03/1951  Date of referral:  07/12/15               Reason for consult:  Facility Placement                Permission sought to share information with:  Facility Contact Representative Permission granted to share information::  Yes, Verbal Permission Granted  Name::      Skilled Nursing Facility   Agency::   [facility]   Relationship::     Contact Information:     Housing/Transportation Living arrangements for the past 2 months:  Single Family Home Source of Information:  Patient Patient Interpreter Needed:  None Criminal Activity/Legal Involvement Pertinent to Current Situation/Hospitalization:  No - Comment as needed Significant Relationships:  Siblings Lives with:  Self Do you feel safe going back to the place where you live?  Yes Need for family participation in patient care:  Yes (Comment)  Care giving concerns:  Patient lives at [facility] senior apartment complex in [place].     Social Worker assessment / plan:  Clinical Social Worker (CSW) met with patient to discuss D/C plan. PT is recommending SNF. Per [facility] lia[facility] patient is followed by home based palliative care and [facility]. Patient was alert and oriented and was laying in the bed. CSW introduced self and explained role of CSW department. Patient reported that she lives alone in senior apartments. CSW explained that PT is recommending SNF and that her [employer] insurance will have to approve SNF stay. Patient is agreeable to SNF search and has been to [facility] in the past. Patient reported that she wants [facility]. Patient stated that she does not want to go to [facility].   FL2 complete and faxed out. CSW presented bed offers. Patient chose [facility]. Patient reported that she may still go home however she will talk to her sister about D/C plan. [facility] liaision started [employer]  authorization today. CSW will continue to follow and assist as needed.   Employment status:  Disabled (Comment on whether or not currently receiving Disability) Insurance information:  Managed Medicare PT Recommendations:  Skilled Nursing Facility Information / Referral to community resources:  Skilled Nursing Facility  Patient/Family's Response to care:  Patient is agreeable to go to [facility] however she does want to discuss D/C plan with her sister.   Patient/Family's Understanding of and Emotional Response to Diagnosis, Current Treatment, and Prognosis:  Patient was pleasant and thanked CSW for visit.   Emotional Assessment Appearance:  Appears stated age Attitude/Demeanor/Rapport:    Affect (typically observed):  Accepting, Adaptable, Pleasant Orientation:  Oriented to Self, Oriented to Place, Oriented to  Time, Oriented to Situation Alcohol / Substance use:  Not Applicable Psych involvement (Current and /or in the community):  No (Comment)  Discharge Needs  Concerns to be addressed:  Discharge Planning Concerns Readmission within the last 30 days:  No Current discharge risk:  Dependent with Mobility, Chronically ill Barriers to Discharge:  Continued Medical Work up   Morgan,  G, LCSW 07/12/2015, 4:23 PM  

## 2015-07-12 NOTE — Progress Notes (Signed)
[facility]                                                                                                                                                                                            Patient Demographics   Loretta Wade, is a 65 y.o. female, DOB - 01-05-1951, [id number]  Admit date - 07/11/2015   Admitting Physician Gladstone Lighter, MD  Outpatient Primary MD for the patient is Keith Rake, MD   LOS -   Subjective:Patient admitted after a fall and hip pain continues to complain of pain in the right hip left foot. X-ray of the hip was negative.     Review of Systems:   CONSTITUTIONAL: No documented fever. No fatigue, weakness. No weight gain, no weight loss.  EYES: No blurry or double vision.  ENT: No tinnitus. No postnasal drip. No redness of the oropharynx.  RESPIRATORY: No cough, no wheeze, no hemoptysis Chronic dyspnea.  CARDIOVASCULAR: No chest pain. No orthopnea. No palpitations. No syncope.  GASTROINTESTINAL: No nausea, no vomiting or diarrhea. No abdominal pain. No melena or hematochezia.  GENITOURINARY: No dysuria or hematuria.  ENDOCRINE: No polyuria or nocturia. No heat or cold intolerance.  HEMATOLOGY: No anemia. No bruising. No bleeding.  INTEGUMENTARY: No rashes. No lesions.  MUSCULOSKELETAL: No arthritis. No swelling. No gout. Left hip pain right foot pain NEUROLOGIC: No numbness, tingling, or ataxia. No seizure-type activity.  PSYCHIATRIC: No anxiety. No insomnia. No ADD.    Vitals:   Filed Vitals:   07/11/15 2020 07/12/15 0519 07/12/15 0754 07/12/15 0942  BP:  128/60 142/72   Pulse:  60 72 84  Temp:  98.9 F (37.2 C) 99.6 F (37.6 C)   TempSrc:  Oral Oral   Resp:  20 18   Height:      Weight:      SpO2: 96% 94%  95%    Wt Readings from Last 3 Encounters:  07/11/15 150.277 kg (331 lb 4.8 oz)  06/21/15 145.605 kg (321 lb)  06/17/15 145.786 kg (321 lb 6.4 oz)     Intake/Output  Summary (Last 24 hours) at 07/12/15 1326 Last data filed at 07/12/15 1131  Gross per 24 hour  Intake    240 ml  Output    320 ml  Net    -80 ml    Physical Exam:   GENERAL: Pleasant-appearing in no apparent distress.  HEAD, EYES, EARS, NOSE AND THROAT: Atraumatic, normocephalic. Extraocular muscles are intact. Pupils equal and reactive to light. Sclerae anicteric. No conjunctival injection. No oro-pharyngeal erythema.  NECK: Supple. There is no jugular venous  distention. No bruits, no lymphadenopathy, no thyromegaly.  HEART: Regular rate and rhythm,. No murmurs, no rubs, no clicks.  LUNGS: Clear to auscultation bilaterally. No rales or rhonchi. No wheezes.  ABDOMEN: Soft, flat, nontender, nondistended. Has good bowel sounds. No hepatosplenomegaly appreciated.  EXTREMITIES: No evidence of any cyanosis, clubbing, or peripheral edema.  +2 pedal and radial pulses bilaterally.  NEUROLOGIC: The patient is alert, awake, and oriented x3 with no focal motor or sensory deficits appreciated bilaterally.  SKIN: Moist and warm with no rashes appreciated.  Psych: Not anxious, depressed LN: No inguinal LN enlargement    Antibiotics   Anti-infectives    Start     Dose/Rate Route Frequency Ordered Stop   07/12/15 1000  fluconazole (DIFLUCAN) tablet 150 mg     150 mg Oral Daily 07/11/15 1742 07/14/15 0959   07/11/15 1830  fluconazole (DIFLUCAN) tablet 150 mg     150 mg Oral  Once 07/11/15 1738 07/11/15 1851      Medications   Scheduled Meds: . budesonide  0.5 mg Nebulization BID  . Chlorhexidine Gluconate Cloth  6 each Topical Q0600  . cholecalciferol  1,000 Units Oral Daily  . citalopram  20 mg Oral Daily  . colesevelam  625 mg Oral BID WC  . famotidine  20 mg Oral BID  . fluconazole  150 mg Oral Daily  . fluticasone furoate-vilanterol  1 puff Inhalation Daily  . furosemide  20 mg Oral BID  . heparin subcutaneous  5,000 Units Subcutaneous Q8H  . magnesium oxide  200 mg Oral Daily  .  mupirocin ointment  1 application Nasal BID  . nystatin  5 mL Oral QID  . nystatin   Topical BID  . oxybutynin  5 mg Oral Daily  . pantoprazole  40 mg Oral Daily  . pregabalin  150 mg Oral BID  . propranolol ER  80 mg Oral Daily  . rOPINIRole  0.75 mg Oral QHS  . tiotropium  18 mcg Inhalation Daily   Continuous Infusions:  PRN Meds:.acetaminophen, fluticasone, guaiFENesin-dextromethorphan, HYDROcodone-acetaminophen, ipratropium-albuterol, loperamide, morphine injection, ondansetron **OR** ondansetron (ZOFRAN) IV   Data Review:   Micro Results Recent Results (from the past 240 hour(s))  MRSA PCR Screening     Status: Abnormal   Collection Time: 07/11/15  8:11 PM  Result Value Ref Range Status   MRSA by PCR POSITIVE (A) NEGATIVE Final    Comment:        The GeneXpert MRSA Assay (FDA approved for NASAL specimens only), is one component of a comprehensive MRSA colonization surveillance program. It is not intended to diagnose MRSA infection nor to guide or monitor treatment for MRSA infections. CRITICAL RESULT CALLED TO, READ BACK BY AND VERIFIED WITH: TERESA COBLE AT 2146 ON 07/11/15 RWW     Radiology Reports Dg Lumbar Spine 2-3 Views  07/11/2015  CLINICAL DATA:  Pain following fall EXAM: LUMBAR SPINE - 2-3 VIEW COMPARISON:  None. FINDINGS: Frontal, lateral, and spot lumbosacral lateral images were obtained. There are 5 non-rib-bearing lumbar type vertebral bodies. There is mild levoscoliosis. There is no fracture or spondylolisthesis. There is moderate disc space narrowing at L4-5. There is slightly milder disc space narrowing at L2-3, L3-4, and L5-S1. There are prominent anterior osteophytes at L3, L4, L5. No erosive change. IMPRESSION: Negative. Osteoarthritic change, most notably at L4-5. Mild scoliosis. No fracture or spondylolisthesis. Electronically Signed   By: Lowella Grip III M.D.   On: 07/11/2015 15:13   Dg Foot 2 Views Left  07/12/2015  CLINICAL DATA:  Left foot  pain, left foot injury yesterday, generalized foot pain EXAM: LEFT FOOT - 2 VIEW COMPARISON:  None. FINDINGS: Two views of the left foot submitted. No acute fracture or subluxation. Dorsal spurring noted tarsal region and tarsal metatarsal joint. There is mild soft tissue swelling dorsal tarsal and metatarsal region. Tiny plantar spur of calcaneus. IMPRESSION: No acute fracture or subluxation. Dorsal spurring tarsal region and metatarsal phalangeal joint. Soft tissue swelling dorsal tarsal metatarsal region. Electronically Signed   By: Lahoma Crocker M.D.   On: 07/12/2015 12:23   Ct Renal Stone Study  06/21/2015  CLINICAL DATA:  Right-sided flank pain since last night. No dysuria or hematuria. EXAM: CT ABDOMEN AND PELVIS WITHOUT CONTRAST TECHNIQUE: Multidetector CT imaging of the abdomen and pelvis was performed following the standard protocol without IV contrast. COMPARISON:  11/13/2014 FINDINGS: Lower chest: The lung bases are clear of acute process. The heart is normal in size. No pericardial effusion. Prominent pericardial and epicardial fat. The distal esophagus is grossly normal. Hepatobiliary: No focal hepatic lesions or intrahepatic biliary dilatation. The gallbladder is surgically absent. No common bowel duct dilatation. Calcified granulomas are noted in the liver. Pancreas: No mass, inflammation or ductal dilatation. Spleen: Normal size.  No focal lesions. Adrenals/Urinary Tract: The adrenal glands and kidneys are unremarkable. No renal or obstructing ureteral calculi or bladder calculi. Stable large simple appearing upper pole left renal cyst. Parapelvic cysts are also noted. Stomach/Bowel: The stomach, duodenum, small bowel and colon are grossly normal without oral contrast. No inflammatory changes, mass lesions or obstructive findings. The terminal ileum is normal. The appendix is normal. Vascular/Lymphatic: No mesenteric or retroperitoneal mass or adenopathy. The aorta is normal in caliber. Minimal  scattered atherosclerotic calcifications. Ovarian vein calcifications are noted. Reproductive: The uterus is surgically absent. The right ovary is still present and appears normal. Small scattered pelvic lymph nodes are stable. Other: No pelvic mass or adenopathy. No inguinal mass or adenopathy. Musculoskeletal: No acute bony findings. Moderate degenerative changes involving the spine. IMPRESSION: No acute abdominal/pelvic findings, mass lesions or lymphadenopathy. No renal, ureteral or bladder calculi. Status post cholecystectomy.  No biliary dilatation. Stable large simple appearing left renal cyst. Electronically Signed   By: Marijo Sanes M.D.   On: 06/21/2015 10:00   Dg Hips Bilat With Pelvis 3-4 Views  07/11/2015  CLINICAL DATA:  Pain following fall EXAM: DG HIP (WITH OR WITHOUT PELVIS) 3-4V BILAT COMPARISON:  None. FINDINGS: Frontal pelvis as well as frontal and lateral views of each hip-total five views- were obtained. There is no demonstrable fracture or dislocation. There is slight symmetric narrowing of both hip joints. No erosive change. IMPRESSION: Slight symmetric narrowing of both hip joints. No fracture or dislocation. Electronically Signed   By: Lowella Grip III M.D.   On: 07/11/2015 15:09     CBC  Recent Labs Lab 07/11/15 1408 07/12/15 0353  WBC 6.3 5.0  HGB 11.3* 11.4*  HCT 34.6* 34.9*  PLT 208 214  MCV 94.0 93.6  MCH 30.7 30.5  MCHC 32.6 32.6  RDW 15.6* 15.6*    Chemistries   Recent Labs Lab 07/11/15 1408 07/12/15 0353  NA 141 143  K 4.0 4.1  CL 103 104  CO2 31 32  GLUCOSE 92 104*  BUN 13 13  CREATININE 0.71 0.74  CALCIUM 9.1 9.2  AST 20  --   ALT 19  --   ALKPHOS 100  --   BILITOT 0.7  --    ------------------------------------------------------------------------------------------------------------------  estimated creatinine clearance is 106.9 mL/min (by C-G formula based on Cr of  0.74). ------------------------------------------------------------------------------------------------------------------ No results for input(s): HGBA1C in the last 72 hours. ------------------------------------------------------------------------------------------------------------------ No results for input(s): CHOL, HDL, LDLCALC, TRIG, CHOLHDL, LDLDIRECT in the last 72 hours. ------------------------------------------------------------------------------------------------------------------ No results for input(s): TSH, T4TOTAL, T3FREE, THYROIDAB in the last 72 hours.  Invalid input(s): FREET3 ------------------------------------------------------------------------------------------------------------------ No results for input(s): VITAMINB12, FOLATE, FERRITIN, TIBC, IRON, RETICCTPCT in the last 72 hours.  Coagulation profile No results for input(s): INR, PROTIME in the last 168 hours.  No results for input(s): DDIMER in the last 72 hours.  Cardiac Enzymes No results for input(s): CKMB, TROPONINI, MYOGLOBIN in the last 168 hours.  Invalid input(s): CK ------------------------------------------------------------------------------------------------------------------ Invalid input(s): [place]   Loretta Wade is a 66 y.o. female with a known history of obesity obesity, arthritis, fibromyalgia, COPD and sleep apnea on 2 L home oxygen, GERD, hypertension diastolic CHF presents to the hospital secondary to a fall at the assisted living facility.  #1 fall and right-sided pain- Continues to complain of pain I will order a CT scan of the pelvis I will also order left foot x-ray Continue pain control  #2 COPD and OSA- Continue oxygen at nighttime.  #3 chronic pain-secondary to fibromyalgia and arthritis. Follows with pain management clinic. Continue Norco and later,  #4 chronic diastolic CHF-continue Lasix. Currently compensated no evidence of  exacerbation  #5 GERD-on Protonix  #6 urinalysis without evidence of UTI  #7 DVT prophylaxis-on subcutaneous heparin      Code Status Orders        Start     Ordered   07/11/15 1644  Full code   Continuous     07/11/15 1643    Code Status History    Date Active Date Inactive Code Status Order ID Comments User Context   05/12/2015  6:04 PM 05/16/2015  7:58 PM Full Code [id number]  Demetrios Loll, MD Inpatient   04/12/2015  5:08 PM 04/15/2015  6:04 PM Full Code [id number]  Lytle Butte, MD ED   01/28/2015  8:08 AM 02/08/2015  2:34 PM Full Code [id number]  Loletha Grayer, MD ED   11/13/2014  1:02 PM 11/19/2014  4:39 PM Full Code [id number]  Theodoro Grist, MD Inpatient   11/04/2014  5:50 PM 11/08/2014  4:37 PM Full Code [id number]  Vaughan Basta, MD Inpatient           Consults  PT   DVT Prophylaxis heparin  Lab Results  Component Value Date   PLT 214 07/12/2015     Time Spent in minutes   35 minutes  Greater than 50% of time spent in care coordination and counseling patient regarding the condition and plan of care.   Dustin Flock M.D on 07/12/2015 at 1:26 PM  Between 7am to 6pm - Pager - [phone]  After 6pm go to [email or web address] - password [email or web address] [facility]   Office  [phone]

## 2015-07-12 NOTE — Clinical Social Work Placement (Addendum)
   CLINICAL SOCIAL WORK PLACEMENT  NOTE  Date:  07/12/2015  Patient Details  Name: Loretta Wade MRN: [id number] Date of Birth: [birth year]  Clinical Social Work is seeking post-discharge placement for this patient at the [place] level of care (*CSW will initial, date and re-position this form in  chart as items are completed):  Yes   Patient/family provided with [facility] Work Department's list of facilities offering this level of care within the geographic area requested by the patient (or if unable, by the patient's family).  Yes   Patient/family informed of their freedom to choose among providers that offer the needed level of care, that participate in Medicare, Medicaid or managed care program needed by the patient, have an available bed and are willing to accept the patient.  Yes   Patient/family informed of [facility]'s ownership interest in [facility] and [facility], as well as of the fact that they are under no obligation to receive care at these facilities.  PASRR submitted to EDS on       PASRR number received on       Existing PASRR number confirmed on 07/12/15     FL2 transmitted to all facilities in geographic area requested by pt/family on 07/12/15     FL2 transmitted to all facilities within larger geographic area on       Patient informed that his/her managed care company has contracts with or will negotiate with certain facilities, including the following:        Yes   Patient/family informed of bed offers received.  Patient chooses bed at [facility]   Physician recommends and patient chooses bed at      Patient to be transferred to   on  .  Patient to be transferred to facility by       Patient family notified on   of transfer.  Name of family member notified:        PHYSICIAN       Additional Comment:    _______________________________________________ Loralyn Freshwater, LCSW 07/12/2015, 4:21  PM

## 2015-07-12 NOTE — Evaluation (Signed)
Physical Therapy Evaluation Patient Details Name: Loretta Wade MRN: [id number] DOB: 1950-08-21 Today's Date: 07/12/2015   History of Present Illness  65 yo F presented to ED after a fall and resulting R hip pain and is currently under observation for pain. Current imaging show no fracture at this time. PMH includes OA, fibromyalgia, COPD with 2L O2 at home, and CHF. She has been to the ED 4 times in the past 6 months.  Clinical Impression  Pt demonstrated generalized weakness and difficulty walking with severe acute pain. She c/o severe R posterior hip pain of 9/10. Pt required +2 mod to max assistance for bed mobility. For attempt of STS she required +2 mod A and FWW. Pt was unable to stand fully upright due to severe pain and returned to sitting within <10 seconds. RN present and aware. Pt needs further assessment of R hip pain. Due to highly limited mobility, STR is recommended to address deficits of strength, transfers and gait to progress towards PLOF before returning home alone. Pt will benefit from skilled PT services to increase functional I and mobility for safe discharge.     Follow Up Recommendations SNF    Equipment Recommendations  None recommended by PT (pt has recommended FWW)    Recommendations for Other Services       Precautions / Restrictions Precautions Precautions: Fall Restrictions Weight Bearing Restrictions: No      Mobility  Bed Mobility Overal bed mobility: Needs Assistance;+2 for physical assistance Bed Mobility: Supine to Sit;Sit to Supine     Supine to sit: Mod assist;+2 for physical assistance;HOB elevated Sit to supine: Max assist;+2 for physical assistance;HOB elevated   General bed mobility comments: difficulty due to severe pain  Transfers Overall transfer level: Needs assistance Equipment used: Rolling walker (2 wheeled) Transfers: Sit to/from Stand Sit to Stand: Mod assist;+2 physical assistance;From elevated surface          General transfer comment: Pt had significant difficulty due to R hip pain and was unable to come to full upright position.  Ambulation/Gait             General Gait Details: unable at this time due to pain  Stairs            Wheelchair Mobility    Modified Rankin (Stroke Patients Only)       Balance Overall balance assessment: History of Falls;Needs assistance Sitting-balance support: Bilateral upper extremity supported;Feet supported Sitting balance-[name] Scale: Fair Sitting balance - Comments: maintains at EOB   Standing balance support: Bilateral upper extremity supported Standing balance-[name] Scale: Poor Standing balance comment: limited by pain                             Pertinent Vitals/Pain Pain Assessment: 0-10 Pain Score: 9  Pain Location: R posterior hip Pain Descriptors / Indicators: Aching;Constant Pain Intervention(s): Limited activity within patient's tolerance;Monitored during session;Premedicated before session;Repositioned    Home Living Family/patient expects to be discharged to:: Assisted living [facility] apartment) Living Arrangements: Alone             Home Equipment: Environmental consultant - 2 wheels;Hospital bed;Wheelchair - manual;Bedside commode;Grab bars - tub/shower Additional Comments: Pt has been receiving HH services through [facility]. Pt reports that she will be recieving HH services through [facility] until 05/28/15. Pt states that she does not receive assistance from the facility where she lives.    Prior Function Level of Independence: Needs assistance  Gait / Transfers Assistance Needed: Pt ambulates with rolling walker. States she occasionally uses wheelchair as well.  ADL's / Homemaking Assistance Needed: Family assists with meals/groceries        Hand Dominance   Dominant Hand: Right    Extremity/Trunk Assessment   Upper Extremity Assessment: Overall WFL for tasks assessed           Lower  Extremity Assessment: Generalized weakness (difficult to assess due to pain)      Cervical / Trunk Assessment: Kyphotic  Communication   Communication: No difficulties  Cognition Arousal/Alertness: Awake/alert Behavior During Therapy: WFL for tasks assessed/performed Overall Cognitive Status: Within Functional Limits for tasks assessed                      General Comments General comments (skin integrity, edema, etc.): severe R hip pain greatly limits mobility and activity tolerance    Exercises Other Exercises Other Exercises: Pt sat at EOB x10 minutes with LE supported with cues for postural correction and pursed lip breathing. Due to pain pt tended to breath shallow and fast but able to correct with cues. She is limited in activity/mobility due to pain.      Assessment/Plan    PT Assessment Patient needs continued PT services  PT Diagnosis Difficulty walking;Generalized weakness;Acute pain   PT Problem List Decreased strength;Decreased range of motion;Decreased activity tolerance;Decreased balance;Decreased mobility;Cardiopulmonary status limiting activity;Pain;Obesity  PT Treatment Interventions Gait training;Therapeutic activities;Therapeutic exercise;Balance training;Neuromuscular re-education;Patient/family education   PT Goals (Current goals can be found in the Care Plan section) Acute Rehab PT Goals Patient Stated Goal: for the pain to go away PT Goal Formulation: With patient Time For Goal Achievement: 07/26/15 Potential to Achieve Goals: Fair    Frequency Min 2X/week   Barriers to discharge Decreased caregiver support pt lives alone, requires +2 assistance at this time    Co-evaluation               End of Session Equipment Utilized During Treatment: Gait belt;Oxygen Activity Tolerance: Patient limited by pain Patient left: in bed;with call bell/phone within reach;with bed alarm set;with nursing/sitter in room;with SCD's reapplied Nurse  Communication: Mobility status;Other (comment) (potential need for more imaging of painful area)    Functional Assessment Tool Used: clinical judgement; inability to transfer Functional Limitation: Mobility: Walking and moving around Mobility: Walking and Moving Around Current Status [id number]): At least 60 percent but less than 80 percent impaired, limited or restricted Mobility: Walking and Moving Around Goal Status [phone]): At least 1 percent but less than 20 percent impaired, limited or restricted    Time: 0908-0932 PT Time Calculation (min) (ACUTE ONLY): 24 min   Charges:   PT Evaluation $PT Eval High Complexity: 1 Procedure PT Treatments $Therapeutic Activity: 8-22 mins   PT G Codes:   PT G-Codes **NOT FOR INPATIENT CLASS** Functional Assessment Tool Used: clinical judgement; inability to transfer Functional Limitation: Mobility: Walking and moving around Mobility: Walking and Moving Around Current Status [id number]): At least 60 percent but less than 80 percent impaired, limited or restricted Mobility: Walking and Moving Around Goal Status [phone]): At least 1 percent but less than 20 percent impaired, limited or restricted    Neoma Laming, PT, DPT  07/12/2015, 10:02 AM [phone]

## 2015-07-12 NOTE — Care Management (Addendum)
RNCM consult received. Patient has [employer] which on rare occassions will pay for LTAC. Patient appears to be open to [facility] home [facility]. I have sent message to Santiago Glad with [facility] to see if patient was still receiving home health. RNCM will follow up also with patient. Patient is COPD Gold and appears to be on chronic O2. [place] is an apartment complex for low income clients-some rooms may be handicap accessible.

## 2015-07-12 NOTE — Progress Notes (Signed)
Pt states she is not with [facility] nor [facility] anymore, services were discontinued in March 2017. Notified SW, Case Mgr.

## 2015-07-12 NOTE — Progress Notes (Signed)
[facility]  Patient Status: Active  [facility] is providing the following services: SN/PT/HHA  If patient discharges after hours, please call [phone].   Florene Glen 07/12/2015, 10:32 AM

## 2015-07-12 NOTE — NC FL2 (Signed)
[place] LEVEL OF CARE SCREENING TOOL     IDENTIFICATION  Patient Name: Loretta Wade Birthdate: [birth year] Sex: female Admission Date (Current Location): 07/11/2015  [place] and [place] Number:  Engineering geologist and Address:  [facility], [address] [place], [place] [address]      Provider Number: [id number]  Attending Physician Name and Address:  Dustin Flock, MD  Relative Name and Phone Number:       Current Level of Care: Hospital Recommended Level of Care: [place] Prior Approval Number:    Date Approved/Denied:   PASRR Number:  ( [id number] )  Discharge Plan: SNF    Current Diagnoses: Patient Active Problem List   Diagnosis Date Noted  . Fall 07/11/2015  . Dyslipidemia 06/17/2015  . Overflow stress incontinence of urine in female 06/17/2015  . Acute sinusitis treated with antibiotics in the past 60 days 06/17/2015  . Chronic pain 06/10/2015  . Chronic low back pain (Location of Tertiary source of pain) (Bilateral) (R>L) 06/10/2015  . Chronic shoulder pain (Location of Secondary source of pain) (Left) 06/10/2015  . Costochondritis (Location of Primary Source of Pain) 06/10/2015  . Long term current use of opiate analgesic 06/10/2015  . Long term prescription opiate use 06/10/2015  . Opiate use 06/10/2015  . Encounter for therapeutic drug level monitoring 06/10/2015  . Encounter for pain management planning 06/10/2015  . Chronic feet pain (Bilateral) (R>L) 06/10/2015  . Osteoarthritis 06/10/2015  . Osteoarthritis of shoulder (Left) 06/10/2015  . COPD exacerbation ([place]) 04/12/2015  . Poor mobility 04/12/2015  . Fibromyalgia 03/23/2015  . Esophageal reflux 03/23/2015  . MRSA infection 11/17/2014  . Diastolic CHF, acute on chronic (HCC) 11/12/2014  . Morbid obesity with BMI of 50.0-59.9, adult ([place]) 11/12/2014  . Neck pain, chronic 11/12/2014  . COPD, frequent exacerbations ([place])  11/04/2014    Orientation RESPIRATION BLADDER Height & Weight     Self, Time, Situation, Place  O2 (2 Liters Oxygen ) Continent Weight: (!) 331 lb 4.8 oz (150.277 kg) Height:  5\' 4"  (162.6 cm)  BEHAVIORAL SYMPTOMS/MOOD NEUROLOGICAL BOWEL NUTRITION STATUS   (none )  (none ) Continent Diet (Diet: Heart Healthy )  AMBULATORY STATUS COMMUNICATION OF NEEDS Skin   Extensive Assist Verbally Normal                       Personal Care Assistance Level of Assistance  Bathing, Feeding, Dressing Bathing Assistance: Maximum assistance Feeding assistance: Independent Dressing Assistance: Limited assistance     Functional Limitations Info  Sight, Hearing, Speech Sight Info: Adequate Hearing Info: Adequate Speech Info: Adequate    SPECIAL CARE FACTORS FREQUENCY  PT (By licensed PT), OT (By licensed OT)     PT Frequency:  (5) OT Frequency:  (5)            Contractures      Additional Factors Info  Code Status, Allergies, Isolation Precautions Code Status Info:  (Full Code. ) Allergies Info:  (No Known Allergies. )     Isolation Precautions Info:  (History of MRSA)     Current Medications (07/12/2015):  This is the current hospital active medication list Current Facility-Administered Medications  Medication Dose Route Frequency Provider Last Rate Last Dose  . acetaminophen (TYLENOL) tablet 650 mg  650 mg Oral Q6H PRN Gladstone Lighter, MD      . budesonide (PULMICORT) nebulizer solution 0.5 mg  0.5 mg Nebulization BID Gladstone Lighter, MD  0.5 mg at 07/12/15 0752  . Chlorhexidine Gluconate Cloth 2 % PADS 6 each  6 each Topical Q0600 Gladstone Lighter, MD      . cholecalciferol (VITAMIN D) tablet 1,000 Units  1,000 Units Oral Daily Gladstone Lighter, MD   1,000 Units at 07/12/15 [phone]  . citalopram (CELEXA) tablet 20 mg  20 mg Oral Daily Gladstone Lighter, MD   20 mg at 07/12/15 0918  . colesevelam [facility]) tablet 625 mg  625 mg Oral BID WC Gladstone Lighter, MD   625 mg at  07/12/15 0918  . famotidine (PEPCID) tablet 20 mg  20 mg Oral BID Gladstone Lighter, MD   20 mg at 07/12/15 0923  . fluconazole (DIFLUCAN) tablet 150 mg  150 mg Oral Daily Gladstone Lighter, MD   150 mg at 07/12/15 0929  . fluticasone (FLONASE) 50 MCG/ACT nasal spray 2 spray  2 spray Each Nare Daily PRN Gladstone Lighter, MD      . fluticasone furoate-vilanterol (BREO ELLIPTA) 100-25 MCG/INH 1 puff  1 puff Inhalation Daily Gladstone Lighter, MD   1 puff at 07/12/15 0924  . furosemide (LASIX) tablet 20 mg  20 mg Oral BID Dustin Flock, MD      . guaiFENesin-dextromethorphan (ROBITUSSIN DM) 100-10 MG/5ML syrup 5-10 mL  5-10 mL Oral Q4H PRN Gladstone Lighter, MD      . heparin injection 5,000 Units  5,000 Units Subcutaneous [address] [place], [facility]   5,000 Units at 07/11/15 2037  . HYDROcodone-acetaminophen (NORCO/VICODIN) 5-325 MG per tablet 1 tablet  1 tablet Oral Q8H PRN Gladstone Lighter, MD   1 tablet at 07/12/15 0929  . ipratropium-albuterol (DUONEB) 0.5-2.5 (3) MG/3ML nebulizer solution 3 mL  3 mL Nebulization Q6H PRN Gladstone Lighter, MD      . loperamide (IMODIUM) capsule 2-4 mg  2-4 mg Oral PRN Gladstone Lighter, MD      . magnesium oxide (MAG-OX) tablet 200 mg  200 mg Oral Daily Lenis Noon, RPH   200 mg at 07/12/15 0919  . morphine 2 MG/ML injection 2 mg  2 mg Intravenous Q4H PRN Gladstone Lighter, MD   2 mg at 07/12/15 0558  . mupirocin ointment (BACTROBAN) 2 % 1 application  1 application Nasal BID Gladstone Lighter, MD   1 application at [id number] [phone]  . nystatin (MYCOSTATIN) 100000 UNIT/ML suspension 500,000 Units  5 mL Oral QID Gladstone Lighter, MD   500,000 Units at 07/12/15 0111  . nystatin (MYCOSTATIN) powder   Topical BID Gladstone Lighter, MD      . ondansetron (ZOFRAN) tablet 4 mg  4 mg Oral Q6H PRN Gladstone Lighter, MD       Or  . ondansetron (ZOFRAN) injection 4 mg  4 mg Intravenous Q6H PRN Gladstone Lighter, MD      . oxybutynin (DITROPAN) tablet 5 mg  5 mg Oral Daily  Gladstone Lighter, MD   5 mg at 07/12/15 [id number]  . pantoprazole (PROTONIX) EC tablet 40 mg  40 mg Oral Daily Gladstone Lighter, MD   40 mg at 07/12/15 0918  . pregabalin (LYRICA) capsule 150 mg  150 mg Oral BID Gladstone Lighter, MD   150 mg at 07/12/15 [id number]  . propranolol ER (INDERAL LA) 24 hr capsule 80 mg  80 mg Oral Daily Gladstone Lighter, MD   80 mg at 07/12/15 0923  . rOPINIRole (REQUIP) tablet 0.75 mg  0.75 mg Oral QHS Gladstone Lighter, MD      . tiotropium [facility]) inhalation capsule 18 mcg  18  mcg Inhalation Daily Gladstone Lighter, MD   18 mcg at 07/12/15 [id number]     Discharge Medications: Please see discharge summary for a list of discharge medications.  Relevant Imaging Results:  Relevant Lab Results:   Additional Information  (SSN: [id number])  Loralyn Freshwater, LCSW

## 2015-07-13 DIAGNOSIS — M25551 Pain in right hip: Secondary | ICD-10-CM | POA: Diagnosis not present

## 2015-07-13 MED ORDER — SALINE SPRAY 0.65 % NA SOLN
1.0000 | Freq: Three times a day (TID) | NASAL | Status: DC
  Administered 2015-07-13 – 2015-07-14 (×3): 1 via NASAL
  Filled 2015-07-13: qty 44

## 2015-07-13 MED ORDER — SALINE SPRAY 0.65 % NA SOLN: 1.0000 | NASAL | Status: DC | PRN

## 2015-07-13 NOTE — Progress Notes (Signed)
[place] at [facility]                                                                                                                                                                                            Patient Demographics   Loretta Wade, is a 65 y.o. female, DOB - December 15, 1950, [id number]  Admit date - 07/11/2015   Admitting Physician Gladstone Lighter, MD  Outpatient Primary MD for the patient is Keith Rake, MD   LOS -   Subjective:Patient admitted after a fall and hip pain continues to complain of pain in the right hip left foot. X-ray of the hip was negative.     Review of Systems:   CONSTITUTIONAL: No documented fever. No fatigue, weakness. No weight gain, no weight loss.  EYES: No blurry or double vision.  ENT: No tinnitus. No postnasal drip. No redness of the oropharynx.  RESPIRATORY: No cough, no wheeze, no hemoptysis Chronic dyspnea.  CARDIOVASCULAR: No chest pain. No orthopnea. No palpitations. No syncope.  GASTROINTESTINAL: No nausea, no vomiting or diarrhea. No abdominal pain. No melena or hematochezia.  GENITOURINARY: No dysuria or hematuria.  ENDOCRINE: No polyuria or nocturia. No heat or cold intolerance.  HEMATOLOGY: No anemia. No bruising. No bleeding.  INTEGUMENTARY: No rashes. No lesions.  MUSCULOSKELETAL: No arthritis. No swelling. No gout. Left hip pain right foot pain NEUROLOGIC: No numbness, tingling, or ataxia. No seizure-type activity.  PSYCHIATRIC: No anxiety. No insomnia. No ADD.    Vitals:   Filed Vitals:   07/12/15 1528 07/12/15 2006 07/13/15 0336 07/13/15 0736  BP: 132/76 110/62 118/78 126/74  Pulse: 61 60 54 62  Temp: 99.1 F (37.3 C) 98.3 F (36.8 C) 97.3 F (36.3 C) 98.3 F (36.8 C)  TempSrc: Oral Oral Oral Oral  Resp: 18 18 20 18   Height:      Weight:      SpO2: 96% 97% 96% 97%    Wt Readings from Last 3 Encounters:  07/11/15 150.277 kg (331 lb 4.8 oz)  06/21/15 145.605 kg (321 lb)   06/17/15 145.786 kg (321 lb 6.4 oz)     Intake/Output Summary (Last 24 hours) at 07/13/15 1224 Last data filed at 07/13/15 1131  Gross per 24 hour  Intake    960 ml  Output   1175 ml  Net   -215 ml    Physical Exam:   GENERAL: Pleasant-appearing in no apparent distress.  HEAD, EYES, EARS, NOSE AND THROAT: Atraumatic, normocephalic. Extraocular muscles are intact. Pupils equal and reactive to light. Sclerae anicteric. No conjunctival injection. No oro-pharyngeal erythema.  NECK: Supple. There  is no jugular venous distention. No bruits, no lymphadenopathy, no thyromegaly.  HEART: Regular rate and rhythm,. No murmurs, no rubs, no clicks.  LUNGS: Clear to auscultation bilaterally. No rales or rhonchi. No wheezes.  ABDOMEN: Soft, flat, nontender, nondistended. Has good bowel sounds. No hepatosplenomegaly appreciated.  EXTREMITIES: No evidence of any cyanosis, clubbing, or peripheral edema.  +2 pedal and radial pulses bilaterally.  NEUROLOGIC: The patient is alert, awake, and oriented x3 with no focal motor or sensory deficits appreciated bilaterally.  SKIN: Moist and warm with no rashes appreciated.  Psych: Not anxious, depressed LN: No inguinal LN enlargement    Antibiotics   Anti-infectives    Start     Dose/Rate Route Frequency Ordered Stop   07/12/15 1000  fluconazole (DIFLUCAN) tablet 150 mg     150 mg Oral Daily 07/11/15 1742 07/13/15 0832   07/11/15 1830  fluconazole (DIFLUCAN) tablet 150 mg     150 mg Oral  Once 07/11/15 1738 07/11/15 1851      Medications   Scheduled Meds: . Chlorhexidine Gluconate Cloth  6 each Topical Q0600  . cholecalciferol  1,000 Units Oral Daily  . citalopram  20 mg Oral Daily  . colesevelam  625 mg Oral BID WC  . famotidine  20 mg Oral BID  . fluticasone furoate-vilanterol  1 puff Inhalation Daily  . furosemide  20 mg Oral BID  . heparin subcutaneous  5,000 Units Subcutaneous Q8H  . magnesium oxide  200 mg Oral Daily  . mupirocin  ointment  1 application Nasal BID  . nystatin  5 mL Oral QID  . nystatin   Topical BID  . oxybutynin  5 mg Oral Daily  . pantoprazole  40 mg Oral Daily  . pregabalin  150 mg Oral BID  . propranolol ER  80 mg Oral Daily  . rOPINIRole  0.75 mg Oral QHS  . sodium chloride  1 spray Each Nare TID  . tiotropium  18 mcg Inhalation Daily   Continuous Infusions:  PRN Meds:.acetaminophen, fluticasone, guaiFENesin-dextromethorphan, HYDROcodone-acetaminophen, ipratropium-albuterol, loperamide, morphine injection, ondansetron **OR** ondansetron (ZOFRAN) IV   Data Review:   Micro Results Recent Results (from the past 240 hour(s))  MRSA PCR Screening     Status: Abnormal   Collection Time: 07/11/15  8:11 PM  Result Value Ref Range Status   MRSA by PCR POSITIVE (A) NEGATIVE Final    Comment:        The GeneXpert MRSA Assay (FDA approved for NASAL specimens only), is one component of a comprehensive MRSA colonization surveillance program. It is not intended to diagnose MRSA infection nor to guide or monitor treatment for MRSA infections. CRITICAL RESULT CALLED TO, READ BACK BY AND VERIFIED WITH: TERESA COBLE AT 2146 ON 07/11/15 RWW     Radiology Reports Dg Lumbar Spine 2-3 Views  07/11/2015  CLINICAL DATA:  Pain following fall EXAM: LUMBAR SPINE - 2-3 VIEW COMPARISON:  None. FINDINGS: Frontal, lateral, and spot lumbosacral lateral images were obtained. There are 5 non-rib-bearing lumbar type vertebral bodies. There is mild levoscoliosis. There is no fracture or spondylolisthesis. There is moderate disc space narrowing at L4-5. There is slightly milder disc space narrowing at L2-3, L3-4, and L5-S1. There are prominent anterior osteophytes at L3, L4, L5. No erosive change. IMPRESSION: Negative. Osteoarthritic change, most notably at L4-5. Mild scoliosis. No fracture or spondylolisthesis. Electronically Signed   By: Lowella Grip III M.D.   On: 07/11/2015 15:13   Ct Pelvis Wo  Contrast  07/12/2015  CLINICAL DATA:  Right-sided pelvic pain since falling yesterday. Initial encounter. EXAM: CT PELVIS WITHOUT CONTRAST TECHNIQUE: Multidetector CT imaging of the pelvis was performed following the standard protocol without intravenous contrast. COMPARISON:  Pelvic CT 06/21/2015 FINDINGS: Urinary Tract: The visualized distal ureters and bladder appear unremarkable. Bowel: No bowel wall thickening, distention or surrounding inflammation identified within the pelvis. Vascular/Lymphatic: There is stable prominent left common iliac node measuring up to 8 mm on image 8. Small pelvic lymph nodes bilaterally are unchanged. There are phleboliths of the ovarian veins bilaterally. No acute vascular findings seen on noncontrast imaging. Reproductive: Hysterectomy. Residual right ovarian tissue appears unchanged. No evidence of adnexal mass. Other: The anterior abdominal wall is partly excluded from the field-of-view. An umbilical hernia containing fat appears grossly stable. Musculoskeletal: No evidence of acute fracture or dislocation. There is no evidence of femoral head avascular necrosis or significant hip arthropathy. There are degenerative changes within the lower lumbar spine and sacroiliac joints. There is some foraminal narrowing at L5-S1 which appears grossly stable. IMPRESSION: 1. No acute osseous findings are seen status post recent fall. No evidence of acute fracture or dislocation. 2. Stable degenerative changes of the lower lumbar spine and sacroiliac joints. 3. The internal pelvic contents appear grossly stable without acute findings. Electronically Signed   By: Richardean Sale M.D.   On: 07/12/2015 15:44   Dg Foot 2 Views Left  07/12/2015  CLINICAL DATA:  Left foot pain, left foot injury yesterday, generalized foot pain EXAM: LEFT FOOT - 2 VIEW COMPARISON:  None. FINDINGS: Two views of the left foot submitted. No acute fracture or subluxation. Dorsal spurring noted tarsal region and tarsal  metatarsal joint. There is mild soft tissue swelling dorsal tarsal and metatarsal region. Tiny plantar spur of calcaneus. IMPRESSION: No acute fracture or subluxation. Dorsal spurring tarsal region and metatarsal phalangeal joint. Soft tissue swelling dorsal tarsal metatarsal region. Electronically Signed   By: Lahoma Crocker M.D.   On: 07/12/2015 12:23   Ct Renal Stone Study  06/21/2015  CLINICAL DATA:  Right-sided flank pain since last night. No dysuria or hematuria. EXAM: CT ABDOMEN AND PELVIS WITHOUT CONTRAST TECHNIQUE: Multidetector CT imaging of the abdomen and pelvis was performed following the standard protocol without IV contrast. COMPARISON:  11/13/2014 FINDINGS: Lower chest: The lung bases are clear of acute process. The heart is normal in size. No pericardial effusion. Prominent pericardial and epicardial fat. The distal esophagus is grossly normal. Hepatobiliary: No focal hepatic lesions or intrahepatic biliary dilatation. The gallbladder is surgically absent. No common bowel duct dilatation. Calcified granulomas are noted in the liver. Pancreas: No mass, inflammation or ductal dilatation. Spleen: Normal size.  No focal lesions. Adrenals/Urinary Tract: The adrenal glands and kidneys are unremarkable. No renal or obstructing ureteral calculi or bladder calculi. Stable large simple appearing upper pole left renal cyst. Parapelvic cysts are also noted. Stomach/Bowel: The stomach, duodenum, small bowel and colon are grossly normal without oral contrast. No inflammatory changes, mass lesions or obstructive findings. The terminal ileum is normal. The appendix is normal. Vascular/Lymphatic: No mesenteric or retroperitoneal mass or adenopathy. The aorta is normal in caliber. Minimal scattered atherosclerotic calcifications. Ovarian vein calcifications are noted. Reproductive: The uterus is surgically absent. The right ovary is still present and appears normal. Small scattered pelvic lymph nodes are stable.  Other: No pelvic mass or adenopathy. No inguinal mass or adenopathy. Musculoskeletal: No acute bony findings. Moderate degenerative changes involving the spine. IMPRESSION: No acute abdominal/pelvic findings, mass lesions or lymphadenopathy.  No renal, ureteral or bladder calculi. Status post cholecystectomy.  No biliary dilatation. Stable large simple appearing left renal cyst. Electronically Signed   By: Marijo Sanes M.D.   On: 06/21/2015 10:00   Dg Hips Bilat With Pelvis 3-4 Views  07/11/2015  CLINICAL DATA:  Pain following fall EXAM: DG HIP (WITH OR WITHOUT PELVIS) 3-4V BILAT COMPARISON:  None. FINDINGS: Frontal pelvis as well as frontal and lateral views of each hip-total five views- were obtained. There is no demonstrable fracture or dislocation. There is slight symmetric narrowing of both hip joints. No erosive change. IMPRESSION: Slight symmetric narrowing of both hip joints. No fracture or dislocation. Electronically Signed   By: Lowella Grip III M.D.   On: 07/11/2015 15:09     CBC  Recent Labs Lab 07/11/15 1408 07/12/15 0353  WBC 6.3 5.0  HGB 11.3* 11.4*  HCT 34.6* 34.9*  PLT 208 214  MCV 94.0 93.6  MCH 30.7 30.5  MCHC 32.6 32.6  RDW 15.6* 15.6*    Chemistries   Recent Labs Lab 07/11/15 1408 07/12/15 0353  NA 141 143  K 4.0 4.1  CL 103 104  CO2 31 32  GLUCOSE 92 104*  BUN 13 13  CREATININE 0.71 0.74  CALCIUM 9.1 9.2  AST 20  --   ALT 19  --   ALKPHOS 100  --   BILITOT 0.7  --    ------------------------------------------------------------------------------------------------------------------ estimated creatinine clearance is 106.9 mL/min (by C-G formula based on Cr of 0.74). ------------------------------------------------------------------------------------------------------------------ No results for input(s): HGBA1C in the last 72 hours. ------------------------------------------------------------------------------------------------------------------ No  results for input(s): CHOL, HDL, LDLCALC, TRIG, CHOLHDL, LDLDIRECT in the last 72 hours. ------------------------------------------------------------------------------------------------------------------ No results for input(s): TSH, T4TOTAL, T3FREE, THYROIDAB in the last 72 hours.  Invalid input(s): FREET3 ------------------------------------------------------------------------------------------------------------------ No results for input(s): VITAMINB12, FOLATE, FERRITIN, TIBC, IRON, RETICCTPCT in the last 72 hours.  Coagulation profile No results for input(s): INR, PROTIME in the last 168 hours.  No results for input(s): DDIMER in the last 72 hours.  Cardiac Enzymes No results for input(s): CKMB, TROPONINI, MYOGLOBIN in the last 168 hours.  Invalid input(s): CK ------------------------------------------------------------------------------------------------------------------ Invalid input(s): [place]   Loretta Wade is a 65 y.o. female with a known history of obesity obesity, arthritis, fibromyalgia, COPD and sleep apnea on 2 L home oxygen, GERD, hypertension diastolic CHF presents to the hospital secondary to a fall at the assisted living facility.  #1 fall and right-sided pain- Ct of hip without any fracture Xray of foot neg for fx  #2 COPD and OSA- Continue o2 as needed  #3 chronic pain-secondary to fibromyalgia and arthritis. Follows with pain management clinic. Continue Norco and later,  #4 chronic diastolic CHF-continue Lasix. Currently compensated no evidence of exacerbation  #5 GERD-on Protonix  #6 urinalysis without evidence of UTI  #7 DVT prophylaxis-on subcutaneous heparin  dispo need to SNF tomm    Code Status Orders        Start     Ordered   07/11/15 1644  Full code   Continuous     07/11/15 1643    Code Status History    Date Active Date Inactive Code Status Order ID Comments User Context   05/12/2015  6:04 PM 05/16/2015   7:58 PM Full Code [id number]  Demetrios Loll, MD Inpatient   04/12/2015  5:08 PM 04/15/2015  6:04 PM Full Code [id number]  Lytle Butte, MD ED   01/28/2015  8:08 AM 02/08/2015  2:34 PM  Full Code [id number]  Loletha Grayer, MD ED   11/13/2014  1:02 PM 11/19/2014  4:39 PM Full Code [id number]  Theodoro Grist, MD Inpatient   11/04/2014  5:50 PM 11/08/2014  4:37 PM Full Code [id number]  Vaughan Basta, MD Inpatient           Consults  PT   DVT Prophylaxis heparin  Lab Results  Component Value Date   PLT 214 07/12/2015     Time Spent in minutes   33minutes  Greater than 50% of time spent in care coordination and counseling patient regarding the condition and plan of care.   Dustin Flock M.D on 07/13/2015 at 12:24 PM  Between 7am to 6pm - Pager - [phone]  After 6pm go to [email or web address] - password [email or web address] [facility]   Office  [phone]

## 2015-07-13 NOTE — Progress Notes (Signed)
Per Kim admissions coordinator at [facility] patient will go to private room [place] and [place] will not cover co-pays because they do not have any open medicaid certified beds. Clinical Social Worker (CSW) made patient aware of above. Patient verbalized her understanding and is agreeable to going to [place]. Kim started [employer] authorization today. CSW will continue to follow and assist as needed.   Blima Rich, LCSW [phone]

## 2015-07-13 NOTE — Progress Notes (Signed)
Patient is still requesting [employer] and stated that she knows people that work there. Per Maudie Mercury admissions coordinator at [facility] they can accept patient and start [facility] authorization today. [facility] liaison reported that he has stopped [place] authorization. CSW also confirmed that patient has Medicaid, which is now reflected on the facesheet. CSW will continue to follow and assist as needed.   Blima Rich, LCSW [phone]

## 2015-07-13 NOTE — Progress Notes (Addendum)
Physical Therapy Treatment Patient Details Name: Loretta Wade MRN: [id number] DOB: 02-04-1951 Today's Date: 07/13/2015    History of Present Illness 65 yo F presented to ED after a fall and resulting R hip pain and is currently under observation for pain. Current imaging show no fracture at this time. PMH includes OA, fibromyalgia, COPD with 2L O2 at home, and CHF. She has been to the ED 4 times in the past 6 months.    PT Comments    Pt gradually progressing towards goals, but is still limited by pain.Pt received a CT of the pelvis yesterday with No acute osseous findings are seen status post recent fall. No evidence of acute fracture or dislocation. She was able to perform bed mobility with +1 mod A today instead of +2. Pt tolerated supine therex, but was limited in ROM and required AAROM for R LE due to pain. She was able to sit EOB without support and brush her teeth with min guard. Current plan remains appropriate. Progress standing as tolerated.  Follow Up Recommendations  SNF     Equipment Recommendations  None recommended by PT    Recommendations for Other Services       Precautions / Restrictions Precautions Precautions: Fall Restrictions Weight Bearing Restrictions: No    Mobility  Bed Mobility Overal bed mobility: Needs Assistance Bed Mobility: Supine to Sit;Sit to Supine     Supine to sit: Mod assist;HOB elevated Sit to supine: Mod assist;HOB elevated   General bed mobility comments: pt able to perform with less assistance today, limited by pain  Transfers                 General transfer comment: NT today due to continued severe pain in R hip  Ambulation/Gait             General Gait Details: unable at this time due to pain   Stairs            Wheelchair Mobility    Modified Rankin (Stroke Patients Only)       Balance Overall balance assessment: History of Falls;Needs assistance Sitting-balance support: No upper extremity  supported;Feet unsupported Sitting balance-[name] Scale: Good Sitting balance - Comments: maintains at EOB       Standing balance comment: NT                     Cognition Arousal/Alertness: Awake/alert Behavior During Therapy: WFL for tasks assessed/performed Overall Cognitive Status: Within Functional Limits for tasks assessed                      Exercises Other Exercises Other Exercises: Pt sat at EOB x5 minutes with min guard without supported with cues for postural correction and pursed lip breathing. Due to pain pt tended to breath shallow and fast but able to correct with cues. She is limited in activity/mobility due to pain. Increased ability to sit independently today. Other Exercises: B LE supine therex: ankle pumps, QS, GS, heel slides, hip abd slides, hip add squeezes, SAQs x15 each. AAROM for  R LE. Cues for pursed lip breathing to maintain oxygen saturation and facilitate relaxation. Therapeutic rest breaks for energy conservation.    General Comments        Pertinent Vitals/Pain Pain Assessment: 0-10 Pain Score: 9  Pain Location: R posterior hip Pain Descriptors / Indicators: Aching Pain Intervention(s): Limited activity within patient's tolerance;Monitored during session;Premedicated before session    Home Living  Prior Function            PT Goals (current goals can now be found in the care plan section) Acute Rehab PT Goals Patient Stated Goal: for the pain to go away PT Goal Formulation: With patient Time For Goal Achievement: 07/26/15 Potential to Achieve Goals: Fair Progress towards PT goals: Progressing toward goals    Frequency  Min 2X/week    PT Plan Current plan remains appropriate    Co-evaluation             End of Session Equipment Utilized During Treatment: Oxygen Activity Tolerance: Patient limited by pain Patient left: in bed;with call bell/phone within reach;with bed alarm set;with  nursing/sitter in room;with SCD's reapplied     Time: 1043-1110 PT Time Calculation (min) (ACUTE ONLY): 27 min  Charges:  $Therapeutic Exercise: 23-37 mins                    G Codes:      Neoma Laming, PT, DPT  07/13/2015, 11:26 AM [phone]

## 2015-07-14 ENCOUNTER — Encounter
Admission: RE | Admit: 2015-07-14 | Discharge: 2015-07-14 | Disposition: A | Discharge status: Home / Self Care | Payer: Medicare PPO | Source: Ambulatory Visit | Attending: Internal Medicine | Admitting: Internal Medicine

## 2015-07-14 DIAGNOSIS — M25551 Pain in right hip: Secondary | ICD-10-CM | POA: Diagnosis not present

## 2015-07-14 DIAGNOSIS — R509 Fever, unspecified: Secondary | ICD-10-CM | POA: Insufficient documentation

## 2015-07-14 DIAGNOSIS — Z7901 Long term (current) use of anticoagulants: Secondary | ICD-10-CM | POA: Insufficient documentation

## 2015-07-14 MED ORDER — KETOROLAC TROMETHAMINE 30 MG/ML IJ SOLN
30.0000 mg | Freq: Once | INTRAMUSCULAR | Status: AC
  Administered 2015-07-14: 30 mg via INTRAVENOUS
  Filled 2015-07-14: qty 1

## 2015-07-14 MED ORDER — HYDROCODONE-ACETAMINOPHEN 5-325 MG PO TABS: 1.0000 | ORAL_TABLET | ORAL | Status: DC | PRN

## 2015-07-14 MED ORDER — MORPHINE SULFATE ER 15 MG PO TBCR: 15.0000 mg | EXTENDED_RELEASE_TABLET | Freq: Two times a day (BID) | ORAL | Status: DC

## 2015-07-14 MED ORDER — HYDROCODONE-ACETAMINOPHEN 5-325 MG PO TABS
2.0000 | ORAL_TABLET | ORAL | Status: DC | PRN
  Administered 2015-07-14: 2 via ORAL
  Filled 2015-07-14: qty 2

## 2015-07-14 MED ORDER — MORPHINE SULFATE ER 30 MG PO TBCR: 30.0000 mg | EXTENDED_RELEASE_TABLET | Freq: Two times a day (BID) | ORAL | Status: DC

## 2015-07-14 NOTE — Clinical Social Work Placement (Signed)
   CLINICAL SOCIAL WORK PLACEMENT  NOTE  Date:  07/14/2015  Patient Details  Name: Loretta Wade MRN: [id number] Date of Birth: 1951-01-25  Clinical Social Work is seeking post-discharge placement for this patient at the [place] level of care (*CSW will initial, date and re-position this form in  chart as items are completed):  Yes   Patient/family provided with [place] Work Department's list of facilities offering this level of care within the geographic area requested by the patient (or if unable, by the patient's family).  Yes   Patient/family informed of their freedom to choose among providers that offer the needed level of care, that participate in Medicare, Medicaid or managed care program needed by the patient, have an available bed and are willing to accept the patient.  Yes   Patient/family informed of [facility]'s ownership interest in [facility] and [facility], as well as of the fact that they are under no obligation to receive care at these facilities.  PASRR submitted to EDS on       PASRR number received on       Existing PASRR number confirmed on 07/12/15     FL2 transmitted to all facilities in geographic area requested by pt/family on 07/12/15     FL2 transmitted to all facilities within larger geographic area on       Patient informed that his/her managed care company has contracts with or will negotiate with certain facilities, including the following:        Yes   Patient/family informed of bed offers received.  Patient chooses bed at  [facility] )     Physician recommends and patient chooses bed at      Patient to be transferred to  [facility] ) on 07/14/15.  Patient to be transferred to facility by  [facility] )     Patient family notified on 07/14/15 of transfer.  Name of family member notified:   (Per patient she will contact her family and make them aware of D/C today. )      PHYSICIAN       Additional Comment:    _______________________________________________ Loralyn Freshwater, LCSW 07/14/2015, 3:12 PM

## 2015-07-14 NOTE — Discharge Summary (Signed)
Loretta Wade, 65 y.o., DOB 1950-06-19, MRN [id number]. Admission date: 07/11/2015 Discharge Date 07/14/2015 Primary MD Keith Rake, MD Admitting Physician Gladstone Lighter, MD  Admission Diagnosis  Fall with injury [T14.90, W19.XXXA]  Discharge Diagnosis   Active Problems:   Fall    OA (osteoarthritis)   . Fibromyalgia     Following with pain management  . Anemia   . Sleep apnea     not on CPAP  . Fatigue   . Edema   . Esophageal reflux   . Nocturia   . Hypertension   . COPD (chronic obstructive pulmonary disease) (HCC)     on 2L home o2  . CHF (congestive heart failure) [facility] a 65 y.[facility]. female with a known history of obesity obesity, arthritis, fibromyalgia, COPD and sleep apnea on 2 L home oxygen, GERD, hypertension diastolic CHF presents to the hospital secondary to a fall at the assisted living facility. Seen in ED xray of hip negative. Underwent ct of pelvis again no fx. Pt sill c/o pain with activity likey due to bruising from fall. She is very weak in need of rehab.             Consults  none  Significant Tests:  See full reports for all details    Dg Lumbar Spine 2-3 Views  07/11/2015  CLINICAL DATA:  Pain following fall EXAM: LUMBAR SPINE - 2-3 VIEW COMPARISON:  None. FINDINGS: Frontal, lateral, and spot lumbosacral lateral images were obtained. There are 5 non-rib-bearing lumbar type vertebral bodies. There is mild levoscoliosis. There is no fracture or spondylolisthesis. There is moderate disc space narrowing at L4-5. There is slightly milder disc space narrowing at L2-3, L3-4, and L5-S1. There are prominent anterior osteophytes at L3, L4, L5. No erosive change. IMPRESSION: Negative. Osteoarthritic change, most notably at L4-5. Mild scoliosis. No fracture or spondylolisthesis. Electronically Signed   By: Lowella Grip III M.D.   On: 07/11/2015 15:13   Ct Pelvis  Wo Contrast  07/12/2015  CLINICAL DATA:  Right-sided pelvic pain since falling yesterday. Initial encounter. EXAM: CT PELVIS WITHOUT CONTRAST TECHNIQUE: Multidetector CT imaging of the pelvis was performed following the standard protocol without intravenous contrast. COMPARISON:  Pelvic CT 06/21/2015 FINDINGS: Urinary Tract: The visualized distal ureters and bladder appear unremarkable. Bowel: No bowel wall thickening, distention or surrounding inflammation identified within the pelvis. Vascular/Lymphatic: There is stable prominent left common iliac node measuring up to 8 mm on image 8. Small pelvic lymph nodes bilaterally are unchanged. There are phleboliths of the ovarian veins bilaterally. No acute vascular findings seen on noncontrast imaging. Reproductive: Hysterectomy. Residual right ovarian tissue appears unchanged. No evidence of adnexal mass. Other: The anterior abdominal wall is partly excluded from the field-of-view. An umbilical hernia containing fat appears grossly stable. Musculoskeletal: No evidence of acute fracture or dislocation. There is no evidence of femoral head avascular necrosis or significant hip arthropathy. There are degenerative changes within the lower lumbar spine and sacroiliac joints. There is some foraminal narrowing at L5-S1 which appears grossly stable. IMPRESSION: 1. No acute osseous findings are seen status post recent fall. No evidence of acute fracture or dislocation. 2. Stable degenerative changes of the lower lumbar spine and sacroiliac joints. 3. The internal pelvic contents appear grossly stable without acute findings. Electronically Signed   By: Richardean Sale M.D.   On: 07/12/2015 15:44   Dg Foot 2  Views Left  07/12/2015  CLINICAL DATA:  Left foot pain, left foot injury yesterday, generalized foot pain EXAM: LEFT FOOT - 2 VIEW COMPARISON:  None. FINDINGS: Two views of the left foot submitted. No acute fracture or subluxation. Dorsal spurring noted tarsal region and  tarsal metatarsal joint. There is mild soft tissue swelling dorsal tarsal and metatarsal region. Tiny plantar spur of calcaneus. IMPRESSION: No acute fracture or subluxation. Dorsal spurring tarsal region and metatarsal phalangeal joint. Soft tissue swelling dorsal tarsal metatarsal region. Electronically Signed   By: Lahoma Crocker M.D.   On: 07/12/2015 12:23   Ct Renal Stone Study  06/21/2015  CLINICAL DATA:  Right-sided flank pain since last night. No dysuria or hematuria. EXAM: CT ABDOMEN AND PELVIS WITHOUT CONTRAST TECHNIQUE: Multidetector CT imaging of the abdomen and pelvis was performed following the standard protocol without IV contrast. COMPARISON:  11/13/2014 FINDINGS: Lower chest: The lung bases are clear of acute process. The heart is normal in size. No pericardial effusion. Prominent pericardial and epicardial fat. The distal esophagus is grossly normal. Hepatobiliary: No focal hepatic lesions or intrahepatic biliary dilatation. The gallbladder is surgically absent. No common bowel duct dilatation. Calcified granulomas are noted in the liver. Pancreas: No mass, inflammation or ductal dilatation. Spleen: Normal size.  No focal lesions. Adrenals/Urinary Tract: The adrenal glands and kidneys are unremarkable. No renal or obstructing ureteral calculi or bladder calculi. Stable large simple appearing upper pole left renal cyst. Parapelvic cysts are also noted. Stomach/Bowel: The stomach, duodenum, small bowel and colon are grossly normal without oral contrast. No inflammatory changes, mass lesions or obstructive findings. The terminal ileum is normal. The appendix is normal. Vascular/Lymphatic: No mesenteric or retroperitoneal mass or adenopathy. The aorta is normal in caliber. Minimal scattered atherosclerotic calcifications. Ovarian vein calcifications are noted. Reproductive: The uterus is surgically absent. The right ovary is still present and appears normal. Small scattered pelvic lymph nodes are  stable. Other: No pelvic mass or adenopathy. No inguinal mass or adenopathy. Musculoskeletal: No acute bony findings. Moderate degenerative changes involving the spine. IMPRESSION: No acute abdominal/pelvic findings, mass lesions or lymphadenopathy. No renal, ureteral or bladder calculi. Status post cholecystectomy.  No biliary dilatation. Stable large simple appearing left renal cyst. Electronically Signed   By: Marijo Sanes M.D.   On: 06/21/2015 10:00   Dg Hips Bilat With Pelvis 3-4 Views  07/11/2015  CLINICAL DATA:  Pain following fall EXAM: DG HIP (WITH OR WITHOUT PELVIS) 3-4V BILAT COMPARISON:  None. FINDINGS: Frontal pelvis as well as frontal and lateral views of each hip-total five views- were obtained. There is no demonstrable fracture or dislocation. There is slight symmetric narrowing of both hip joints. No erosive change. IMPRESSION: Slight symmetric narrowing of both hip joints. No fracture or dislocation. Electronically Signed   By: Lowella Grip III M.D.   On: 07/11/2015 15:09       Today   Subjective:   Loretta Wade  Still has some hip pain  Objective:   Blood pressure 132/62, pulse 64, temperature 97.4 F (36.3 C), temperature source Oral, resp. rate 18, height 5\' 4"  (1.626 m), weight 150.277 kg (331 lb 4.8 oz), SpO2 95 %.  .  Intake/Output Summary (Last 24 hours) at 07/14/15 1336 Last data filed at 07/14/15 1233  Gross per 24 hour  Intake    700 ml  Output    600 ml  Net    100 ml    Exam VITAL SIGNS: Blood pressure 132/62, pulse 64, temperature 97.4  F (36.3 C), temperature source Oral, resp. rate 18, height 5\' 4"  (1.626 m), weight 150.277 kg (331 lb 4.8 oz), SpO2 95 %.  GENERAL:  65 y.o.-year-old patient lying in the bed with no acute distress.  EYES: Pupils equal, round, reactive to light and accommodation. No scleral icterus. Extraocular muscles intact.  HEENT: Head atraumatic, normocephalic. Oropharynx and nasopharynx clear.  NECK:  Supple, no jugular  venous distention. No thyroid enlargement, no tenderness.  LUNGS: Normal breath sounds bilaterally, no wheezing, rales,rhonchi or crepitation. No use of accessory muscles of respiration.  CARDIOVASCULAR: S1, S2 normal. No murmurs, rubs, or gallops.  ABDOMEN: Soft, nontender, nondistended. Bowel sounds present. No organomegaly or mass.  EXTREMITIES: No pedal edema, cyanosis, or clubbing.  NEUROLOGIC: Cranial nerves II through XII are intact. Muscle strength 5/5 in all extremities. Sensation intact. Gait not checked.  PSYCHIATRIC: The patient is alert and oriented x 3.  SKIN: No obvious rash, lesion, or ulcer.   Data Review     CBC w Diff: Lab Results  Component Value Date   WBC 5.0 07/12/2015   WBC 6.1 03/20/2014   HGB 11.4* 07/12/2015   HGB 12.2 03/20/2014   HCT 34.9* 07/12/2015   HCT 39.2 03/20/2014   PLT 214 07/12/2015   PLT 185 03/20/2014   LYMPHOPCT 30 01/28/2015   LYMPHOPCT 44.9 08/04/2013   MONOPCT 16 01/28/2015   MONOPCT 15.0 08/04/2013   EOSPCT 3 01/28/2015   EOSPCT 3.0 08/04/2013   BASOPCT 1 01/28/2015   BASOPCT 1.1 08/04/2013   CMP: Lab Results  Component Value Date   NA 143 07/12/2015   NA 142 03/20/2014   K 4.1 07/12/2015   K 4.2 03/20/2014   CL 104 07/12/2015   CL 106 03/20/2014   CO2 32 07/12/2015   CO2 31 03/20/2014   BUN 13 07/12/2015   BUN 13 03/20/2014   CREATININE 0.74 07/12/2015   CREATININE 1.13 03/20/2014   PROT 6.7 07/11/2015   PROT 7.0 03/20/2014   ALBUMIN 3.4* 07/11/2015   ALBUMIN 3.0* 03/20/2014   BILITOT 0.7 07/11/2015   BILITOT 0.5 03/20/2014   ALKPHOS 100 07/11/2015   ALKPHOS 121* 03/20/2014   AST 20 07/11/2015   AST 31 03/20/2014   ALT 19 07/11/2015   ALT 42 03/20/2014  .  Micro Results Recent Results (from the past 240 hour(s))  MRSA PCR Screening     Status: Abnormal   Collection Time: 07/11/15  8:11 PM  Result Value Ref Range Status   MRSA by PCR POSITIVE (A) NEGATIVE Final    Comment:        The GeneXpert MRSA Assay  (FDA approved for NASAL specimens only), is one component of a comprehensive MRSA colonization surveillance program. It is not intended to diagnose MRSA infection nor to guide or monitor treatment for MRSA infections. CRITICAL RESULT CALLED TO, READ BACK BY AND VERIFIED WITH: TERESA COBLE AT 2146 ON 07/11/15 RWW         Code Status Orders        Start     Ordered   07/11/15 1644  Full code   Continuous     07/11/15 1643    Code Status History    Date Active Date Inactive Code Status Order ID Comments User Context   05/12/2015  6:04 PM 05/16/2015  7:58 PM Full Code [id number]  Demetrios Loll, MD Inpatient   04/12/2015  5:08 PM 04/15/2015  6:04 PM Full Code [id number]  Lytle Butte, MD ED   01/28/2015  8:08 AM 02/08/2015  2:34 PM Full Code [id number]  Loletha Grayer, MD ED   11/13/2014  1:02 PM 11/19/2014  4:39 PM Full Code [id number]  Theodoro Grist, MD Inpatient   11/04/2014  5:50 PM 11/08/2014  4:37 PM Full Code [id number]  Vaughan Basta, MD Inpatient          Follow-up Information    Follow up with [facility].   Specialty:  [place] information:   [address] [place] [phone]      Follow up with Keith Rake, MD In 2 weeks.   Specialty:  Family Medicine   Contact information:   [address] [place] [address] [phone]       Discharge Medications     Medication List    TAKE these medications        acetaminophen 325 MG tablet  Commonly known as:  TYLENOL  Take 2 tablets (650 mg total) by mouth every 6 (six) hours as needed for mild pain (or Fever >/= 101).     albuterol 108 (90 Base) MCG/ACT inhaler  Commonly known as:  PROVENTIL HFA;VENTOLIN HFA  Inhale 2 puffs into the lungs every 6 (six) hours as needed for wheezing or shortness of breath.     albuterol (2.5 MG/3ML) 0.083% nebulizer solution  Commonly known as:  PROVENTIL  Take 2.5 mg by nebulization every 6  (six) hours as needed for wheezing or shortness of breath.     budesonide 0.5 MG/2ML nebulizer solution  Commonly known as:  PULMICORT  Take 0.5 mg by nebulization 2 (two) times daily.     cholecalciferol 1000 units tablet  Commonly known as:  VITAMIN D  Take 1,000 Units by mouth daily.     citalopram 20 MG tablet  Commonly known as:  CELEXA  Take 20 mg by mouth daily.     colesevelam 625 MG tablet  Commonly known as:  WELCHOL  Take 1 tablet (625 mg total) by mouth 2 (two) times daily with a meal.     CORICIDIN D COLD/FLU/SINUS 2-5-325 MG Tabs  Generic drug:  Chlorphen-Phenyleph-APAP  Take 2 tablets by mouth daily as needed (for nasal congestion/cough).     dexlansoprazole 60 MG capsule  Commonly known as:  DEXILANT  Take 1 capsule (60 mg total) by mouth daily.     fluticasone 50 MCG/ACT nasal spray  Commonly known as:  FLONASE  Place 2 sprays into both nostrils daily as needed for rhinitis.     fluticasone furoate-vilanterol 100-25 MCG/INH Aepb  Commonly known as:  BREO ELLIPTA  Inhale 1 puff into the lungs daily.     furosemide 40 MG tablet  Commonly known as:  LASIX  Take 1 tablet (40 mg total) by mouth 2 (two) times daily.     guaiFENesin-dextromethorphan 100-10 MG/5ML syrup  Commonly known as:  ROBITUSSIN DM  Take 5-10 mLs by mouth every 4 (four) hours as needed for cough.     HYDROcodone-acetaminophen 5-325 MG tablet  Commonly known as:  NORCO/VICODIN  Take 1 tablet by mouth every 4 (four) hours as needed for moderate pain.     loperamide 2 MG capsule  Commonly known as:  IMODIUM  Take 2-4 mg by mouth as needed for diarrhea or loose stools.     Magnesium 250 MG Tabs  Take 250 mg by mouth daily.     morphine 15 MG 12 hr tablet  Commonly known as:  MS  CONTIN  Take 1 tablet (15 mg total) by mouth 2 (two) times daily. **patient takes with 30 mg tablet**     morphine 30 MG 12 hr tablet  Commonly known as:  MS CONTIN  Take 1 tablet (30 mg total) by mouth 2  (two) times daily. **patient takes with 15 mg tablet**     oxybutynin 5 MG tablet  Commonly known as:  DITROPAN  Take 1 tablet (5 mg total) by mouth daily.     pantoprazole 40 MG tablet  Commonly known as:  PROTONIX  Take 40 mg by mouth 2 (two) times daily.     predniSONE 10 MG tablet  Commonly known as:  DELTASONE  Take 10 mg by mouth daily with breakfast.     pregabalin 150 MG capsule  Commonly known as:  LYRICA  Take 1 capsule (150 mg total) by mouth 2 (two) times daily.     propranolol ER 80 MG 24 hr capsule  Commonly known as:  INDERAL LA  Take 1 capsule (80 mg total) by mouth daily.     ranitidine 150 MG tablet  Commonly known as:  ZANTAC  Take 150 mg by mouth 3 (three) times daily as needed for heartburn.     rOPINIRole 0.25 MG tablet  Commonly known as:  REQUIP  Take 3 tablets (0.75 mg total) by mouth at bedtime.     tiotropium 18 MCG inhalation capsule  Commonly known as:  SPIRIVA  Place 1 capsule (18 mcg total) into inhaler and inhale daily.           Total Time in preparing paper work, data evaluation and todays exam - 35 minutes  Dustin Flock M.D on 07/14/2015 at [place] PM  [facility]   Office  [phone]

## 2015-07-14 NOTE — Progress Notes (Signed)
Patient is medically stable for D/C to [facility] today. Per Maudie Mercury admissions coordinator at [facility] authorization however they will accept an 5 day LOG. Zack clinical social work Surveyor, quantity approved 5 day LOG. Per Maudie Mercury patient will go to room 332. RN will call report at [phone] and arrange EMS for transport. Clinical Education officer, museum (CSW) sent D/C Summary, FL2, D/C Packet to [employer] via [employer]. CSW explained to patient that if [facility] denies SNF then she will have to discharge from [facility] after 5 days. Patient verbalized her understanding. Patient reported that she will call her family and make them aware of D/C today. Please reconsult if future social work needs arise. CSW signing off.   Blima Rich, LCSW [phone]

## 2015-07-14 NOTE — Progress Notes (Signed)
[place] at [facility]                                                                                                                                                                                            Patient Demographics   Loretta Wade, is a 65 y.o. female, DOB - 1950/10/28, [id number]  Admit date - 07/11/2015   Admitting Physician Gladstone Lighter, MD  Outpatient Primary MD for the patient is Keith Rake, MD   LOS -   Subjective:Continues to complain of pain in the hip was able to go to the bedside commode. Review of Systems:   CONSTITUTIONAL: No documented fever. No fatigue, weakness. No weight gain, no weight loss.  EYES: No blurry or double vision.  ENT: No tinnitus. No postnasal drip. No redness of the oropharynx.  RESPIRATORY: No cough, no wheeze, no hemoptysis Chronic dyspnea.  CARDIOVASCULAR: No chest pain. No orthopnea. No palpitations. No syncope.  GASTROINTESTINAL: No nausea, no vomiting or diarrhea. No abdominal pain. No melena or hematochezia.  GENITOURINARY: No dysuria or hematuria.  ENDOCRINE: No polyuria or nocturia. No heat or cold intolerance.  HEMATOLOGY: No anemia. No bruising. No bleeding.  INTEGUMENTARY: No rashes. No lesions.  MUSCULOSKELETAL: No arthritis. No swelling. No gout. Left hip pain right foot pain NEUROLOGIC: No numbness, tingling, or ataxia. No seizure-type activity.  PSYCHIATRIC: No anxiety. No insomnia. No ADD.    Vitals:   Filed Vitals:   07/13/15 1509 07/13/15 1938 07/14/15 0600 07/14/15 0914  BP: 138/62 130/58 130/60 132/62  Pulse: 63 61 62 64  Temp: 99.4 F (37.4 C) 98.7 F (37.1 C) 98.5 F (36.9 C) 97.4 F (36.3 C)  TempSrc: Oral Oral  Oral  Resp: 18 18 18 18   Height:      Weight:      SpO2: 98% 98% 96% 94%    Wt Readings from Last 3 Encounters:  07/11/15 150.277 kg (331 lb 4.8 oz)  06/21/15 145.605 kg (321 lb)  06/17/15 145.786 kg (321 lb 6.4 oz)     Intake/Output  Summary (Last 24 hours) at 07/14/15 1012 Last data filed at 07/14/15 0545  Gross per 24 hour  Intake    820 ml  Output    600 ml  Net    220 ml    Physical Exam:   GENERAL: Pleasant-appearing in no apparent distress.  HEAD, EYES, EARS, NOSE AND THROAT: Atraumatic, normocephalic. Extraocular muscles are intact. Pupils equal and reactive to light. Sclerae anicteric. No conjunctival injection. No oro-pharyngeal erythema.  NECK: Supple. There is no jugular venous distention. No bruits, no lymphadenopathy, no thyromegaly.  HEART: Regular rate and rhythm,. No murmurs, no rubs, no clicks.  LUNGS: Clear to auscultation bilaterally. No rales or rhonchi. No wheezes.  ABDOMEN: Soft, flat, nontender, nondistended. Has good bowel sounds. No hepatosplenomegaly appreciated.  EXTREMITIES: No evidence of any cyanosis, clubbing, or peripheral edema.  +2 pedal and radial pulses bilaterally.  NEUROLOGIC: The patient is alert, awake, and oriented x3 with no focal motor or sensory deficits appreciated bilaterally.  SKIN: Moist and warm with no rashes appreciated.  Psych: Not anxious, depressed LN: No inguinal LN enlargement    Antibiotics   Anti-infectives    Start     Dose/Rate Route Frequency Ordered Stop   07/12/15 1000  fluconazole (DIFLUCAN) tablet 150 mg     150 mg Oral Daily 07/11/15 1742 07/13/15 0832   07/11/15 1830  fluconazole (DIFLUCAN) tablet 150 mg     150 mg Oral  Once 07/11/15 1738 07/11/15 1851      Medications   Scheduled Meds: . Chlorhexidine Gluconate Cloth  6 each Topical Q0600  . cholecalciferol  1,000 Units Oral Daily  . citalopram  20 mg Oral Daily  . colesevelam  625 mg Oral BID WC  . famotidine  20 mg Oral BID  . fluticasone furoate-vilanterol  1 puff Inhalation Daily  . furosemide  20 mg Oral BID  . heparin subcutaneous  5,000 Units Subcutaneous Q8H  . ketorolac  30 mg Intravenous Once  . magnesium oxide  200 mg Oral Daily  . mupirocin ointment  1 application Nasal  BID  . nystatin  5 mL Oral QID  . nystatin   Topical BID  . oxybutynin  5 mg Oral Daily  . pantoprazole  40 mg Oral Daily  . pregabalin  150 mg Oral BID  . propranolol ER  80 mg Oral Daily  . rOPINIRole  0.75 mg Oral QHS  . sodium chloride  1 spray Each Nare TID  . tiotropium  18 mcg Inhalation Daily   Continuous Infusions:  PRN Meds:.acetaminophen, fluticasone, guaiFENesin-dextromethorphan, HYDROcodone-acetaminophen, ipratropium-albuterol, loperamide, morphine injection, ondansetron **OR** ondansetron (ZOFRAN) IV   Data Review:   Micro Results Recent Results (from the past 240 hour(s))  MRSA PCR Screening     Status: Abnormal   Collection Time: 07/11/15  8:11 PM  Result Value Ref Range Status   MRSA by PCR POSITIVE (A) NEGATIVE Final    Comment:        The GeneXpert MRSA Assay (FDA approved for NASAL specimens only), is one component of a comprehensive MRSA colonization surveillance program. It is not intended to diagnose MRSA infection nor to guide or monitor treatment for MRSA infections. CRITICAL RESULT CALLED TO, READ BACK BY AND VERIFIED WITH: TERESA COBLE AT 2146 ON 07/11/15 RWW     Radiology Reports Dg Lumbar Spine 2-3 Views  07/11/2015  CLINICAL DATA:  Pain following fall EXAM: LUMBAR SPINE - 2-3 VIEW COMPARISON:  None. FINDINGS: Frontal, lateral, and spot lumbosacral lateral images were obtained. There are 5 non-rib-bearing lumbar type vertebral bodies. There is mild levoscoliosis. There is no fracture or spondylolisthesis. There is moderate disc space narrowing at L4-5. There is slightly milder disc space narrowing at L2-3, L3-4, and L5-S1. There are prominent anterior osteophytes at L3, L4, L5. No erosive change. IMPRESSION: Negative. Osteoarthritic change, most notably at L4-5. Mild scoliosis. No fracture or spondylolisthesis. Electronically Signed   By: Lowella Grip III M.D.   On: 07/11/2015 15:13   Ct Pelvis Wo Contrast  07/12/2015  CLINICAL DATA:   Right-sided  pelvic pain since falling yesterday. Initial encounter. EXAM: CT PELVIS WITHOUT CONTRAST TECHNIQUE: Multidetector CT imaging of the pelvis was performed following the standard protocol without intravenous contrast. COMPARISON:  Pelvic CT 06/21/2015 FINDINGS: Urinary Tract: The visualized distal ureters and bladder appear unremarkable. Bowel: No bowel wall thickening, distention or surrounding inflammation identified within the pelvis. Vascular/Lymphatic: There is stable prominent left common iliac node measuring up to 8 mm on image 8. Small pelvic lymph nodes bilaterally are unchanged. There are phleboliths of the ovarian veins bilaterally. No acute vascular findings seen on noncontrast imaging. Reproductive: Hysterectomy. Residual right ovarian tissue appears unchanged. No evidence of adnexal mass. Other: The anterior abdominal wall is partly excluded from the field-of-view. An umbilical hernia containing fat appears grossly stable. Musculoskeletal: No evidence of acute fracture or dislocation. There is no evidence of femoral head avascular necrosis or significant hip arthropathy. There are degenerative changes within the lower lumbar spine and sacroiliac joints. There is some foraminal narrowing at L5-S1 which appears grossly stable. IMPRESSION: 1. No acute osseous findings are seen status post recent fall. No evidence of acute fracture or dislocation. 2. Stable degenerative changes of the lower lumbar spine and sacroiliac joints. 3. The internal pelvic contents appear grossly stable without acute findings. Electronically Signed   By: Richardean Sale M.D.   On: 07/12/2015 15:44   Dg Foot 2 Views Left  07/12/2015  CLINICAL DATA:  Left foot pain, left foot injury yesterday, generalized foot pain EXAM: LEFT FOOT - 2 VIEW COMPARISON:  None. FINDINGS: Two views of the left foot submitted. No acute fracture or subluxation. Dorsal spurring noted tarsal region and tarsal metatarsal joint. There is mild soft  tissue swelling dorsal tarsal and metatarsal region. Tiny plantar spur of calcaneus. IMPRESSION: No acute fracture or subluxation. Dorsal spurring tarsal region and metatarsal phalangeal joint. Soft tissue swelling dorsal tarsal metatarsal region. Electronically Signed   By: Lahoma Crocker M.D.   On: 07/12/2015 12:23   Ct Renal Stone Study  06/21/2015  CLINICAL DATA:  Right-sided flank pain since last night. No dysuria or hematuria. EXAM: CT ABDOMEN AND PELVIS WITHOUT CONTRAST TECHNIQUE: Multidetector CT imaging of the abdomen and pelvis was performed following the standard protocol without IV contrast. COMPARISON:  11/13/2014 FINDINGS: Lower chest: The lung bases are clear of acute process. The heart is normal in size. No pericardial effusion. Prominent pericardial and epicardial fat. The distal esophagus is grossly normal. Hepatobiliary: No focal hepatic lesions or intrahepatic biliary dilatation. The gallbladder is surgically absent. No common bowel duct dilatation. Calcified granulomas are noted in the liver. Pancreas: No mass, inflammation or ductal dilatation. Spleen: Normal size.  No focal lesions. Adrenals/Urinary Tract: The adrenal glands and kidneys are unremarkable. No renal or obstructing ureteral calculi or bladder calculi. Stable large simple appearing upper pole left renal cyst. Parapelvic cysts are also noted. Stomach/Bowel: The stomach, duodenum, small bowel and colon are grossly normal without oral contrast. No inflammatory changes, mass lesions or obstructive findings. The terminal ileum is normal. The appendix is normal. Vascular/Lymphatic: No mesenteric or retroperitoneal mass or adenopathy. The aorta is normal in caliber. Minimal scattered atherosclerotic calcifications. Ovarian vein calcifications are noted. Reproductive: The uterus is surgically absent. The right ovary is still present and appears normal. Small scattered pelvic lymph nodes are stable. Other: No pelvic mass or adenopathy. No  inguinal mass or adenopathy. Musculoskeletal: No acute bony findings. Moderate degenerative changes involving the spine. IMPRESSION: No acute abdominal/pelvic findings, mass lesions or lymphadenopathy. No renal, ureteral or  bladder calculi. Status post cholecystectomy.  No biliary dilatation. Stable large simple appearing left renal cyst. Electronically Signed   By: Marijo Sanes M.D.   On: 06/21/2015 10:00   Dg Hips Bilat With Pelvis 3-4 Views  07/11/2015  CLINICAL DATA:  Pain following fall EXAM: DG HIP (WITH OR WITHOUT PELVIS) 3-4V BILAT COMPARISON:  None. FINDINGS: Frontal pelvis as well as frontal and lateral views of each hip-total five views- were obtained. There is no demonstrable fracture or dislocation. There is slight symmetric narrowing of both hip joints. No erosive change. IMPRESSION: Slight symmetric narrowing of both hip joints. No fracture or dislocation. Electronically Signed   By: Lowella Grip III M.D.   On: 07/11/2015 15:09     CBC  Recent Labs Lab 07/11/15 1408 07/12/15 0353  WBC 6.3 5.0  HGB 11.3* 11.4*  HCT 34.6* 34.9*  PLT 208 214  MCV 94.0 93.6  MCH 30.7 30.5  MCHC 32.6 32.6  RDW 15.6* 15.6*    Chemistries   Recent Labs Lab 07/11/15 1408 07/12/15 0353  NA 141 143  K 4.0 4.1  CL 103 104  CO2 31 32  GLUCOSE 92 104*  BUN 13 13  CREATININE 0.71 0.74  CALCIUM 9.1 9.2  AST 20  --   ALT 19  --   ALKPHOS 100  --   BILITOT 0.7  --    ------------------------------------------------------------------------------------------------------------------ estimated creatinine clearance is 106.9 mL/min (by C-G formula based on Cr of 0.74). ------------------------------------------------------------------------------------------------------------------ No results for input(s): HGBA1C in the last 72 hours. ------------------------------------------------------------------------------------------------------------------ No results for input(s): CHOL, HDL,  LDLCALC, TRIG, CHOLHDL, LDLDIRECT in the last 72 hours. ------------------------------------------------------------------------------------------------------------------ No results for input(s): TSH, T4TOTAL, T3FREE, THYROIDAB in the last 72 hours.  Invalid input(s): FREET3 ------------------------------------------------------------------------------------------------------------------ No results for input(s): VITAMINB12, FOLATE, FERRITIN, TIBC, IRON, RETICCTPCT in the last 72 hours.  Coagulation profile No results for input(s): INR, PROTIME in the last 168 hours.  No results for input(s): DDIMER in the last 72 hours.  Cardiac Enzymes No results for input(s): CKMB, TROPONINI, MYOGLOBIN in the last 168 hours.  Invalid input(s): CK ------------------------------------------------------------------------------------------------------------------ Invalid input(s): [place]   Jasline Castellini is a 65 y.o. female with a known history of obesity obesity, arthritis, fibromyalgia, COPD and sleep apnea on 2 L home oxygen, GERD, hypertension diastolic CHF presents to the hospital secondary to a fall at the assisted living facility.  #1 fall and right-sided pain- Ct of hip without any fracture Xray of foot neg for fx Patient needs rehabilitation   #2 COPD and OSA- Continue o2 as needed  #3 chronic pain-secondary to fibromyalgia and arthritis. Follows with pain management clinic. Continue Norco and later,  #4 chronic diastolic CHF-continue Lasix. Currently compensated no evidence of exacerbation  #5 GERD-on Protonix  #6 urinalysis without evidence of UTI  #7 DVT prophylaxis-on subcutaneous heparin  dispo to skilled nursing facility awaiting bed and authorization from insurance     Code Status Orders        Start     Ordered   07/11/15 1644  Full code   Continuous     07/11/15 1643    Code Status History    Date Active Date Inactive Code Status Order  ID Comments User Context   05/12/2015  6:04 PM 05/16/2015  7:58 PM Full Code [id number]  Demetrios Loll, MD Inpatient   04/12/2015  5:08 PM 04/15/2015  6:04 PM Full Code [id number]  Lytle Butte, MD ED   01/28/2015  8:08 AM 02/08/2015  2:34 PM Full Code [id number]  Loletha Grayer, MD ED   11/13/2014  1:02 PM 11/19/2014  4:39 PM Full Code [id number]  Theodoro Grist, MD Inpatient   11/04/2014  5:50 PM 11/08/2014  4:37 PM Full Code [id number]  Vaughan Basta, MD Inpatient           Consults  PT   DVT Prophylaxis heparin  Lab Results  Component Value Date   PLT 214 07/12/2015     Time Spent in minutes   78minutes  Greater than 50% of time spent in care coordination and counseling patient regarding the condition and plan of care.   Dustin Flock M.D on 07/14/2015 at 10:12 AM  Between 7am to 6pm - Pager - [phone]  After 6pm go to [email or web address] - password [email or web address] [facility]   Office  [phone]

## 2015-07-14 NOTE — Discharge Instructions (Signed)
°  DIET:  cardiac  DISCHARGE CONDITION:  {stable  ACTIVITY:  As tolerated with assitance  OXYGEN:  Home Oxygen: yes   Oxygen Delivery: 2l [place] continous  DISCHARGE LOCATION:  rehab  ADDITIONAL DISCHARGE INSTRUCTION:   If you experience worsening of your admission symptoms, develop shortness of breath, life threatening emergency, suicidal or homicidal thoughts you must seek medical attention immediately by calling 911 or calling your MD immediately  if symptoms less severe.  You Must read complete instructions/literature along with all the possible adverse reactions/side effects for all the Medicines you take and that have been prescribed to you. Take any new Medicines after you have completely understood and accpet all the possible adverse reactions/side effects.   Please note  You were cared for by a hospitalist during your hospital stay. If you have any questions about your discharge medications or the care you received while you were in the hospital after you are discharged, you can call the unit and asked to speak with the hospitalist on call if the hospitalist that took care of you is not available. Once you are discharged, your primary care physician will handle any further medical issues. Please note that NO REFILLS for any discharge medications will be authorized once you are discharged, as it is imperative that you return to your primary care physician (or establish a relationship with a primary care physician if you do not have one) for your aftercare needs so that they can reassess your need for medications and monitor your lab values.

## 2015-07-14 NOTE — Progress Notes (Signed)
DISCHARGE NOTE:  Report called to [place] at [facility]. EMS called for transportation.

## 2015-07-18 DIAGNOSIS — Z7901 Long term (current) use of anticoagulants: Secondary | ICD-10-CM | POA: Diagnosis not present

## 2015-07-18 DIAGNOSIS — R509 Fever, unspecified: Secondary | ICD-10-CM | POA: Diagnosis not present

## 2015-07-18 LAB — URINALYSIS COMPLETE WITH MICROSCOPIC (ARMC ONLY)
Bilirubin Urine: NEGATIVE
GLUCOSE, UA: NEGATIVE mg/dL
Hgb urine dipstick: NEGATIVE
KETONES UR: NEGATIVE mg/dL
NITRITE: POSITIVE — AB
PROTEIN: NEGATIVE mg/dL
Specific Gravity, Urine: 1.024 (ref 1.005–1.030)
pH: 6 (ref 5.0–8.0)

## 2015-07-21 ENCOUNTER — Telehealth: Payer: Self-pay

## 2015-07-21 DIAGNOSIS — R509 Fever, unspecified: Secondary | ICD-10-CM | POA: Diagnosis not present

## 2015-07-21 LAB — CBC WITH DIFFERENTIAL/PLATELET
BASOS ABS: 0 10*3/uL (ref 0–0.1)
Basophils Relative: 0 %
EOS ABS: 0 10*3/uL (ref 0–0.7)
HCT: 37.5 % (ref 35.0–47.0)
Hemoglobin: 11.9 g/dL — ABNORMAL LOW (ref 12.0–16.0)
LYMPHS ABS: 1 10*3/uL (ref 1.0–3.6)
Lymphocytes Relative: 5 %
MCH: 30.1 pg (ref 26.0–34.0)
MCHC: 31.8 g/dL — ABNORMAL LOW (ref 32.0–36.0)
MCV: 94.6 fL (ref 80.0–100.0)
MONO ABS: 2 10*3/uL — AB (ref 0.2–0.9)
Monocytes Relative: 10 %
Neutro Abs: 17.6 10*3/uL — ABNORMAL HIGH (ref 1.4–6.5)
Neutrophils Relative %: 85 %
Platelets: 214 10*3/uL (ref 150–440)
RBC: 3.96 MIL/uL (ref 3.80–5.20)
RDW: 14.9 % — AB (ref 11.5–14.5)
WBC: 20.6 10*3/uL — AB (ref 3.6–11.0)

## 2015-07-21 LAB — COMPREHENSIVE METABOLIC PANEL
ALBUMIN: 3.5 g/dL (ref 3.5–5.0)
ALK PHOS: 140 U/L — AB (ref 38–126)
ALT: 38 U/L (ref 14–54)
ANION GAP: 10 (ref 5–15)
AST: 34 U/L (ref 15–41)
BILIRUBIN TOTAL: 1.3 mg/dL — AB (ref 0.3–1.2)
BUN: 15 mg/dL (ref 6–20)
CALCIUM: 9.4 mg/dL (ref 8.9–10.3)
CO2: 34 mmol/L — ABNORMAL HIGH (ref 22–32)
Chloride: 94 mmol/L — ABNORMAL LOW (ref 101–111)
Creatinine, Ser: 0.92 mg/dL (ref 0.44–1.00)
GFR calc Af Amer: >60 mL/min (ref >60)
GFR calc non Af Amer: >60 mL/min (ref >60)
GLUCOSE: 126 mg/dL — AB (ref 65–99)
Potassium: 4.2 mmol/L (ref 3.5–5.1)
Sodium: 138 mmol/L (ref 135–145)
TOTAL PROTEIN: 7.1 g/dL (ref 6.5–8.1)

## 2015-07-21 LAB — URINE CULTURE: Culture: >=100000 — AB

## 2015-07-21 NOTE — Telephone Encounter (Signed)
Tia from [place] called and stated that pt insurance would not cover incontinence supplies due to diagnosis.I went over all pt diagnosis in chart and she said that none would be able to be used. I asked her to give pt a call and explain that to her

## 2015-07-26 LAB — CULTURE, BLOOD (ROUTINE X 2): Culture: NO GROWTH

## 2015-07-28 DIAGNOSIS — R509 Fever, unspecified: Secondary | ICD-10-CM | POA: Diagnosis not present

## 2015-07-28 LAB — URINALYSIS COMPLETE WITH MICROSCOPIC (ARMC ONLY)
BILIRUBIN URINE: NEGATIVE
Glucose, UA: NEGATIVE mg/dL
Hgb urine dipstick: NEGATIVE
Ketones, ur: NEGATIVE mg/dL
LEUKOCYTES UA: NEGATIVE
NITRITE: NEGATIVE
PH: 5 (ref 5.0–8.0)
Protein, ur: NEGATIVE mg/dL
SPECIFIC GRAVITY, URINE: 1.021 (ref 1.005–1.030)

## 2015-07-29 DIAGNOSIS — R509 Fever, unspecified: Secondary | ICD-10-CM | POA: Diagnosis not present

## 2015-07-29 LAB — URINE CULTURE

## 2015-07-30 LAB — URINALYSIS COMPLETE WITH MICROSCOPIC (ARMC ONLY)
Bacteria, UA: NONE SEEN
Bilirubin Urine: NEGATIVE
Glucose, UA: NEGATIVE mg/dL
Hgb urine dipstick: NEGATIVE
Ketones, ur: NEGATIVE mg/dL
Leukocytes, UA: NEGATIVE
Nitrite: NEGATIVE
PH: 6 (ref 5.0–8.0)
PROTEIN: NEGATIVE mg/dL
RBC / HPF: NONE SEEN RBC/hpf (ref 0–5)
Specific Gravity, Urine: 1.017 (ref 1.005–1.030)
WBC UA: NONE SEEN WBC/hpf (ref 0–5)

## 2015-07-31 LAB — URINE CULTURE: Culture: NO GROWTH

## 2015-08-03 ENCOUNTER — Telehealth: Payer: Self-pay | Admitting: Family Medicine

## 2015-08-03 NOTE — Telephone Encounter (Signed)
Loretta Wade from [place] would like a call back about forms sent to our office on 07/19/2015. Her direct # [phone]. It is ok to leave her a message.

## 2015-08-05 NOTE — Telephone Encounter (Signed)
Returned Loretta Wade from [place] call and left a voice message , all forms have been faxed concerning this patient

## 2015-08-10 ENCOUNTER — Other Ambulatory Visit: Payer: Self-pay

## 2015-08-10 ENCOUNTER — Telehealth: Payer: Self-pay | Admitting: Family Medicine

## 2015-08-10 ENCOUNTER — Institutional Professional Consult (permissible substitution): Payer: Medicare PPO | Admitting: Internal Medicine

## 2015-08-10 DIAGNOSIS — M797 Fibromyalgia: Secondary | ICD-10-CM

## 2015-08-10 MED ORDER — HYDROCODONE-ACETAMINOPHEN 5-325 MG PO TABS: 1.0000 | ORAL_TABLET | ORAL | Status: DC | PRN

## 2015-08-10 NOTE — Telephone Encounter (Signed)
Returned call to discuss referrals to home health, physical therapy, and skilled nursing. Left a voice message for Lattie Haw to call back.

## 2015-08-10 NOTE — Telephone Encounter (Signed)
Routed to Dr. Shah for medication refill approval 

## 2015-08-10 NOTE — Telephone Encounter (Signed)
PT NEEDS A CALL BACK ABOUT HER RX THAT THE MEDICAP HAS SENT IN REQUST THAT HAVE NOT BEEN REFILLED YET.

## 2015-08-10 NOTE — Telephone Encounter (Signed)
Care Manager Jeanmarie Plant would like a call back @ [phone] on behalf of pt.

## 2015-08-10 NOTE — Telephone Encounter (Signed)
Lattie Haw from [place] requesting home health aid, and Physical Therapy and Skilled Nursing. Asking for a verbal. [phone]

## 2015-08-11 ENCOUNTER — Telehealth: Payer: Self-pay

## 2015-08-11 DIAGNOSIS — M797 Fibromyalgia: Secondary | ICD-10-CM

## 2015-08-11 MED ORDER — PREGABALIN 150 MG PO CAPS: 150.0000 mg | ORAL_CAPSULE | Freq: Two times a day (BID) | ORAL | Status: DC

## 2015-08-11 NOTE — Telephone Encounter (Signed)
Medication has been refilled and sent to [facility] 

## 2015-08-13 NOTE — Telephone Encounter (Signed)
Medication has been sent to [facility] on 08/11/2015 pt has been notified

## 2015-08-17 ENCOUNTER — Ambulatory Visit: Payer: Medicare PPO | Admitting: Family Medicine

## 2015-08-17 NOTE — Telephone Encounter (Signed)
Returned call to care Manager Jeanmarie Plant with [facility] and left a voicemail asking her to return call

## 2015-08-24 ENCOUNTER — Telehealth: Payer: Self-pay | Admitting: Family Medicine

## 2015-08-24 ENCOUNTER — Other Ambulatory Visit: Payer: Self-pay | Admitting: Family Medicine

## 2015-08-24 NOTE — Telephone Encounter (Signed)
Loretta Wade from [facility] called stating she needs a order for UA. Pt is having symptoms of uti. Please advise.

## 2015-08-24 NOTE — Telephone Encounter (Signed)
Returned call and left a voice message for Loretta Wade, she may go ahead and obtain a urinalysis to evaluate for UTI symptoms

## 2015-08-25 ENCOUNTER — Telehealth: Payer: Self-pay

## 2015-08-25 NOTE — Telephone Encounter (Signed)
Patient is having edema in bilateral feet and ankle along with increase pain. Patient is on Lasix 40 mg twice daily. Coughing up mucus. Please call Lattie Haw from [facility].

## 2015-08-25 NOTE — Telephone Encounter (Signed)
Called Lattie Haw from [employer] and left a voice message.

## 2015-08-25 NOTE — Telephone Encounter (Addendum)
Contacted patient and she reports that her feet and ankle are swollen, but not more than what she usually for her. She is on furosemide and is urinating an appropriate amount every day. Patient has history of diastolic CHF. Advised to continue taking furosemide and contact our office if she has any additional symptoms. Urinalysis was obtained by the nurse from [facility] and results will be forwarded to [place] for review

## 2015-08-26 ENCOUNTER — Telehealth: Payer: Self-pay

## 2015-08-26 NOTE — Telephone Encounter (Signed)
Routed to Dr. Shah for approval 

## 2015-08-26 NOTE — Telephone Encounter (Signed)
Please obtain the necessary documentation from [facility] to approve shower care for this patient.

## 2015-08-27 ENCOUNTER — Telehealth: Payer: Self-pay | Admitting: Family Medicine

## 2015-08-27 NOTE — Telephone Encounter (Signed)
Returned call and left a voice message. Review of urinalysis and culture show that patient does not have a urinary tract infection. If patient has worsening swelling, she should be seen by an Urgent care virus since I am off on Monday, June 26

## 2015-08-27 NOTE — Telephone Encounter (Signed)
PT SAID THAT THE HOME HEALTH NURSE DID A SPECIMEN AND THE HOME NURSE SAID THAT IT LOOKED LIKE SOMETHING WAS THERE. SAID THAT THEY SENT THE RESULT BY FAX AND WAS WONDERING IF THE DR WAS CALLING ANYTHING IN FOR HER SINCE IT IS HIS HALF DAY. SAYS THAT SHE HAS BEEN BURNING FOR 4 DAYS. [place]

## 2015-08-27 NOTE — Telephone Encounter (Signed)
Lattie Haw requesting return call. States she has faxed UA lab results on patient. Patient does have swelling and is a little nervous. Please give Lattie Haw call [phone]

## 2015-08-27 NOTE — Telephone Encounter (Signed)
Routed to [name] for new prescription approval

## 2015-08-27 NOTE — Telephone Encounter (Signed)
Review of urinalysis shows no evidence for infection. If patient is having any lower urinary tract symptoms, she should be seen by an urgent care provider.

## 2015-08-30 ENCOUNTER — Telehealth: Payer: Self-pay | Admitting: Family Medicine

## 2015-08-30 NOTE — Telephone Encounter (Signed)
Patient called wanting to know if you ever sent the information to [facility] for her shower chair?

## 2015-08-31 NOTE — Telephone Encounter (Signed)
Returned pt call and left voicemail asking her to return my call

## 2015-09-06 ENCOUNTER — Other Ambulatory Visit: Payer: Self-pay | Admitting: Family Medicine

## 2015-09-06 DIAGNOSIS — M797 Fibromyalgia: Secondary | ICD-10-CM

## 2015-09-09 ENCOUNTER — Telehealth: Payer: Self-pay | Admitting: Family Medicine

## 2015-09-09 MED ORDER — PREGABALIN 150 MG PO CAPS: 150.0000 mg | ORAL_CAPSULE | Freq: Two times a day (BID) | ORAL | Status: DC

## 2015-09-09 NOTE — Telephone Encounter (Signed)
Patient called wanting to know the status of her Lyrica refill request that was sent on 09/06/15.  Please call patient back with a status update.

## 2015-09-09 NOTE — Telephone Encounter (Signed)
Medication has been refilled and sent to [facility] 

## 2015-09-10 NOTE — Telephone Encounter (Signed)
Medication has been sent to [facility] on 09/09/2015

## 2015-09-13 ENCOUNTER — Other Ambulatory Visit: Payer: Self-pay | Admitting: Family Medicine

## 2015-09-15 ENCOUNTER — Other Ambulatory Visit: Payer: Self-pay | Admitting: Family Medicine

## 2015-09-15 ENCOUNTER — Telehealth: Payer: Self-pay | Admitting: Family Medicine

## 2015-09-15 DIAGNOSIS — J441 Chronic obstructive pulmonary disease with (acute) exacerbation: Secondary | ICD-10-CM

## 2015-09-15 MED ORDER — ALBUTEROL SULFATE HFA 108 (90 BASE) MCG/ACT IN AERS: 2.0000 | INHALATION_SPRAY | Freq: Four times a day (QID) | RESPIRATORY_TRACT | Status: DC | PRN

## 2015-09-15 NOTE — Telephone Encounter (Signed)
Requesting return call. Pharmacy did not receive her refill on Pantoprozole and the albuterol (for wheezing and shortness of breath). Please send to [facility]

## 2015-09-15 NOTE — Telephone Encounter (Signed)
Refill for albuterol has been provided. Patient is on Dexilant and pantoprazole is not listed in her active medication list. She will need to follow up with gastroenterology

## 2015-09-16 NOTE — Telephone Encounter (Signed)
Patient verbally informed °

## 2015-09-16 NOTE — Telephone Encounter (Signed)
Left voice message for patient to return call. °

## 2015-09-17 ENCOUNTER — Other Ambulatory Visit: Payer: Self-pay | Admitting: Family Medicine

## 2015-09-27 ENCOUNTER — Encounter: Payer: Self-pay | Admitting: *Deleted

## 2015-09-27 ENCOUNTER — Telehealth: Payer: Self-pay | Admitting: Family Medicine

## 2015-09-27 DIAGNOSIS — M797 Fibromyalgia: Secondary | ICD-10-CM

## 2015-09-29 MED ORDER — HYDROCODONE-ACETAMINOPHEN 5-325 MG PO TABS: 1.0000 | ORAL_TABLET | ORAL | 0 refills | Status: DC | PRN

## 2015-09-29 NOTE — Telephone Encounter (Signed)
Prescription for hydrocodone is ready for pickup.

## 2015-09-30 NOTE — Telephone Encounter (Signed)
Patient informed prescription is ready.

## 2015-10-14 ENCOUNTER — Other Ambulatory Visit: Payer: Self-pay | Admitting: Family Medicine

## 2015-10-14 DIAGNOSIS — G2581 Restless legs syndrome: Secondary | ICD-10-CM

## 2015-10-18 ENCOUNTER — Other Ambulatory Visit: Payer: Self-pay | Admitting: Family Medicine

## 2015-10-19 ENCOUNTER — Other Ambulatory Visit: Payer: Self-pay | Admitting: Family Medicine

## 2015-10-19 DIAGNOSIS — Z1231 Encounter for screening mammogram for malignant neoplasm of breast: Secondary | ICD-10-CM

## 2015-10-19 DIAGNOSIS — R0602 Shortness of breath: Secondary | ICD-10-CM | POA: Insufficient documentation

## 2015-10-20 ENCOUNTER — Other Ambulatory Visit: Payer: Self-pay | Admitting: Family Medicine

## 2015-10-26 ENCOUNTER — Ambulatory Visit: Payer: Medicare PPO | Admitting: Family Medicine

## 2015-10-29 ENCOUNTER — Encounter: Payer: Self-pay | Admitting: Family Medicine

## 2015-10-29 ENCOUNTER — Ambulatory Visit (INDEPENDENT_AMBULATORY_CARE_PROVIDER_SITE_OTHER): Payer: Medicare PPO | Admitting: Family Medicine

## 2015-10-29 VITALS — BP 124/70 | HR 62 | Temp 98.7°F | Resp 18

## 2015-10-29 DIAGNOSIS — R059 Cough, unspecified: Secondary | ICD-10-CM

## 2015-10-29 DIAGNOSIS — K219 Gastro-esophageal reflux disease without esophagitis: Secondary | ICD-10-CM | POA: Diagnosis not present

## 2015-10-29 DIAGNOSIS — R05 Cough: Secondary | ICD-10-CM

## 2015-10-29 DIAGNOSIS — M797 Fibromyalgia: Secondary | ICD-10-CM | POA: Diagnosis not present

## 2015-10-29 DIAGNOSIS — J0100 Acute maxillary sinusitis, unspecified: Secondary | ICD-10-CM

## 2015-10-29 MED ORDER — BENZONATATE 200 MG PO CAPS: 200.0000 mg | ORAL_CAPSULE | Freq: Three times a day (TID) | ORAL | 0 refills | Status: DC | PRN

## 2015-10-29 MED ORDER — HYDROCODONE-ACETAMINOPHEN 7.5-300 MG PO TABS: 1.0000 | ORAL_TABLET | Freq: Four times a day (QID) | ORAL | 0 refills | Status: DC | PRN

## 2015-10-29 MED ORDER — MOXIFLOXACIN HCL 400 MG PO TABS: 400.0000 mg | ORAL_TABLET | Freq: Every day | ORAL | 0 refills | Status: DC

## 2015-10-29 MED ORDER — RANITIDINE HCL 150 MG PO TABS: 150.0000 mg | ORAL_TABLET | Freq: Two times a day (BID) | ORAL | 0 refills | Status: DC

## 2015-10-29 NOTE — Progress Notes (Signed)
Name: Loretta Wade   MRN: [id number]    DOB: 1950/06/03   Date:10/29/2015       Progress Note  Subjective  Chief Complaint  Chief Complaint  Patient presents with  . Cough    x2 weeks  . Otalgia  . Follow-up    medication refills    Cough  This is a new problem. Episode onset: 2 weeks ago. The problem has been waxing and waning. The cough is productive of sputum. Associated symptoms include ear pain, headaches, heartburn, myalgias, postnasal drip and shortness of breath. Pertinent negatives include no fever or wheezing. Treatments tried: Has chronic COPD on maintenance meds.   Fibromyalgia: Pt. Presents for follow up of Fibromyalgia, she has chronic generalized musculoskeletal pain including in her back, anterior shoulders, thighs, and lower legs. Patient is taking Hydrocodone-Acetaminophen 5-325 mg every 4 hours as needed and believes the medication does not help relieve her pain. Its an 8/10 and really does not go below an 8/10. She has been on Morphine in the past and has been referred to a Pain Clinic but was unable to follow along with the pain clinic recommendation.    Past Medical History:  Diagnosis Date  . Anemia   . CHF (congestive heart failure) (HCC)   . COPD (chronic obstructive pulmonary disease) (HCC)    on 2L home o2  . Edema   . Esophageal reflux   . Fatigue   . Fibromyalgia    Following with pain management  . Hypertension   . Nocturia   . OA (osteoarthritis)   . Sleep apnea    not on CPAP    Past Surgical History:  Procedure Laterality Date  . ABDOMINAL HYSTERECTOMY    . CHOLECYSTECTOMY    . TOTAL KNEE ARTHROPLASTY     right  . VESICOVAGINAL FISTULA CLOSURE W/ TAH      Family History  Problem Relation Age of Onset  . Hypertension Mother   . Diabetes    . Breast cancer Daughter     Social History   Social History  . Marital status: Single    Spouse name: N/A  . Number of children: N/A  . Years of education: N/A   Occupational  History  . Not on file.   Social History Main Topics  . Smoking status: Former Smoker    Quit date: 11/04/1983  . Smokeless tobacco: Never Used     Comment: quit several years ago - almost 30 years ago  . Alcohol use No  . Drug use: No  . Sexual activity: Not on file   Other Topics Concern  . Not on file   Social History Narrative   From [facility] assisted living.   Has a walker and wheelchair at baseline.     Current Outpatient Prescriptions:  .  acetaminophen (TYLENOL) 325 MG tablet, Take 2 tablets (650 mg total) by mouth every 6 (six) hours as needed for mild pain (or Fever >/= 101)., Disp: 30 tablet, Rfl: 0 .  albuterol (PROVENTIL HFA;VENTOLIN HFA) 108 (90 Base) MCG/ACT inhaler, Inhale 2 puffs into the lungs every 6 (six) hours as needed for wheezing or shortness of breath., Disp: 1 Inhaler, Rfl: 5 .  albuterol (PROVENTIL) (2.5 MG/3ML) 0.083% nebulizer solution, USE ONE TREATMENT EVERY 6 HOURS AS NEEDED FOR WHEEZING OR SHORTNESS OF BREATH, Disp: 360 mL, Rfl: 3 .  budesonide (PULMICORT) 0.5 MG/2ML nebulizer solution, Take 0.5 mg by nebulization 2 (two) times daily. , Disp: , Rfl:  .  Chlorphen-Phenyleph-APAP (CORICIDIN D COLD/FLU/SINUS) 2-5-325 MG TABS, Take 2 tablets by mouth daily as needed (for nasal congestion/cough)., Disp: , Rfl:  .  cholecalciferol (VITAMIN D) 1000 UNITS tablet, Take 1,000 Units by mouth daily. , Disp: , Rfl:  .  citalopram (CELEXA) 20 MG tablet, Take 20 mg by mouth daily., Disp: , Rfl:  .  colesevelam (WELCHOL) 625 MG tablet, Take 1 tablet (625 mg total) by mouth 2 (two) times daily with a meal., Disp: 60 tablet, Rfl: 2 .  dexlansoprazole (DEXILANT) 60 MG capsule, Take 1 capsule (60 mg total) by mouth daily., Disp: 90 capsule, Rfl: 0 .  fluticasone (FLONASE) 50 MCG/ACT nasal spray, Place 2 sprays into both nostrils daily as needed for rhinitis., Disp: , Rfl:  .  fluticasone furoate-vilanterol (BREO ELLIPTA) 100-25 MCG/INH AEPB, Inhale 1 puff into the  lungs daily., Disp: 28 each, Rfl: 2 .  furosemide (LASIX) 40 MG tablet, Take 1 tablet (40 mg total) by mouth 2 (two) times daily., Disp: 30 tablet, Rfl: 60 .  guaiFENesin-dextromethorphan (ROBITUSSIN DM) 100-10 MG/5ML syrup, Take 5-10 mLs by mouth every 4 (four) hours as needed for cough. , Disp: , Rfl:  .  HYDROcodone-acetaminophen (NORCO/VICODIN) 5-325 MG tablet, Take 1 tablet by mouth every 4 (four) hours as needed for moderate pain., Disp: 180 tablet, Rfl: 0 .  loperamide (IMODIUM) 2 MG capsule, Take 2-4 mg by mouth as needed for diarrhea or loose stools., Disp: , Rfl:  .  Magnesium 250 MG TABS, Take 250 mg by mouth daily., Disp: , Rfl:  .  morphine (MS CONTIN) 15 MG 12 hr tablet, Take 1 tablet (15 mg total) by mouth 2 (two) times daily. **patient takes with 30 mg tablet**, Disp: 30 tablet, Rfl: 0 .  morphine (MS CONTIN) 30 MG 12 hr tablet, Take 1 tablet (30 mg total) by mouth 2 (two) times daily. **patient takes with 15 mg tablet**, Disp: 30 tablet, Rfl: 0 .  oxybutynin (DITROPAN) 5 MG tablet, Take 1 tablet (5 mg total) by mouth daily., Disp: 30 tablet, Rfl: 2 .  pantoprazole (PROTONIX) 40 MG tablet, TAKE ONE (1) TABLET BY MOUTH TWO (2) TIMES DAILY, Disp: 60 tablet, Rfl: 2 .  predniSONE (DELTASONE) 10 MG tablet, Take 10 mg by mouth daily with breakfast., Disp: , Rfl:  .  pregabalin (LYRICA) 150 MG capsule, Take 1 capsule (150 mg total) by mouth 2 (two) times daily., Disp: 60 capsule, Rfl: 1 .  propranolol ER (INDERAL LA) 80 MG 24 hr capsule, TAKE ONE CAPSULE BY MOUTH DAILY, Disp: 90 capsule, Rfl: 0 .  ranitidine (ZANTAC) 150 MG tablet, Take 150 mg by mouth 3 (three) times daily as needed for heartburn., Disp: , Rfl:  .  rOPINIRole (REQUIP) 0.25 MG tablet, Take 3 tablets (0.75 mg total) by mouth at bedtime., Disp: 90 tablet, Rfl: 2 .  rOPINIRole (REQUIP) 0.25 MG tablet, TAKE THREE TABLETS BY MOUTH EVERY NIGHT AT BEDTIME, Disp: 270 tablet, Rfl: 0 .  tiotropium (SPIRIVA) 18 MCG inhalation capsule,  Place 1 capsule (18 mcg total) into inhaler and inhale daily., Disp: 30 capsule, Rfl: 2  No Known Allergies   Review of Systems  Constitutional: Negative for fever.  HENT: Positive for ear pain and postnasal drip.   Respiratory: Positive for cough and shortness of breath. Negative for wheezing.   Gastrointestinal: Positive for heartburn. Negative for abdominal pain, nausea and vomiting.  Musculoskeletal: Positive for back pain, joint pain, myalgias and neck pain.  Neurological: Positive for headaches.  Objective  Vitals:   10/29/15 0939  BP: 124/70  Pulse: 62  Resp: 18  Temp: 98.7 F (37.1 C)  TempSrc: Oral  SpO2: 96%    Physical Exam  Constitutional: She is well-developed, well-nourished, and in no distress.  HENT:  Head: Normocephalic and atraumatic.  Right Ear: Tympanic membrane and ear canal normal. No drainage.  Left Ear: Tympanic membrane and ear canal normal. No drainage.  Nose: Right sinus exhibits maxillary sinus tenderness. Left sinus exhibits maxillary sinus tenderness.  Mouth/Throat: Mucous membranes are normal. Posterior oropharyngeal erythema present.  Cardiovascular: Normal rate, regular rhythm, S1 normal and S2 normal.   No murmur heard. Pulmonary/Chest: No respiratory distress. She has decreased breath sounds. She has wheezes in the right middle field and the right lower field.  Psychiatric: Mood, memory, affect and judgment normal.  Nursing note and vitals reviewed.    Assessment & Plan  1. Acute non-recurrent maxillary sinusitis We'll start on moxifloxacin and benzonatate for treatment of acute sinusitis with postnasal drainage causing her recurrent cough. - moxifloxacin (AVELOX) 400 MG tablet; Take 1 tablet (400 mg total) by mouth daily at 8 pm.  Dispense: 5 tablet; Refill: 0 - benzonatate (TESSALON) 200 MG capsule; Take 1 capsule (200 mg total) by mouth 3 (three) times daily as needed for cough.  Dispense: 20 capsule; Refill: 0  2.  Fibromyalgia We will increase Hydrocodone to 7.5 mg to be taken every 6 hours as needed for optimal treatment of pain associated with fibromyalgia. Follow-up in one month - Hydrocodone-Acetaminophen 7.5-300 MG TABS; Take 1 tablet by mouth every 6 (six) hours as needed.  Dispense: 120 each; Refill: 0  3. Gastroesophageal reflux disease, esophagitis presence not specified Take ranitidine 150 mg twice daily instead of 3 times daily as prescribed by Dr. Dareen Piano. She is on dual PPI therapy - ranitidine (ZANTAC) 150 MG tablet; Take 1 tablet (150 mg total) by mouth 2 (two) times daily.  Dispense: 180 tablet; Refill: 0  4. Cough  - DG Chest 2 View; Future   Padraig Nhan Asad A. Faylene Kurtz [facility] 10/29/2015 9:53 AM

## 2015-11-01 ENCOUNTER — Emergency Department: Payer: Medicare PPO

## 2015-11-01 ENCOUNTER — Observation Stay: Admit: 2015-11-01 | Payer: Medicare PPO

## 2015-11-01 ENCOUNTER — Observation Stay
Admission: EM | Admit: 2015-11-01 | Discharge: 2015-11-04 | Disposition: A | Discharge status: Home / Self Care | Payer: Medicare PPO | Attending: Internal Medicine | Admitting: Internal Medicine

## 2015-11-01 ENCOUNTER — Telehealth: Payer: Self-pay | Admitting: Family Medicine

## 2015-11-01 ENCOUNTER — Observation Stay
Admit: 2015-11-01 | Discharge: 2015-11-01 | Disposition: A | Discharge status: Home / Self Care | Payer: Medicare PPO | Attending: Internal Medicine | Admitting: Internal Medicine

## 2015-11-01 DIAGNOSIS — I5032 Chronic diastolic (congestive) heart failure: Secondary | ICD-10-CM | POA: Insufficient documentation

## 2015-11-01 DIAGNOSIS — Z79899 Other long term (current) drug therapy: Secondary | ICD-10-CM | POA: Insufficient documentation

## 2015-11-01 DIAGNOSIS — R1310 Dysphagia, unspecified: Secondary | ICD-10-CM | POA: Diagnosis not present

## 2015-11-01 DIAGNOSIS — K219 Gastro-esophageal reflux disease without esophagitis: Secondary | ICD-10-CM | POA: Insufficient documentation

## 2015-11-01 DIAGNOSIS — I110 Hypertensive heart disease with heart failure: Secondary | ICD-10-CM | POA: Diagnosis not present

## 2015-11-01 DIAGNOSIS — Z6841 Body Mass Index (BMI) 40.0 and over, adult: Secondary | ICD-10-CM | POA: Diagnosis not present

## 2015-11-01 DIAGNOSIS — J9610 Chronic respiratory failure, unspecified whether with hypoxia or hypercapnia: Secondary | ICD-10-CM | POA: Diagnosis not present

## 2015-11-01 DIAGNOSIS — Z87891 Personal history of nicotine dependence: Secondary | ICD-10-CM | POA: Diagnosis not present

## 2015-11-01 DIAGNOSIS — J209 Acute bronchitis, unspecified: Secondary | ICD-10-CM | POA: Diagnosis not present

## 2015-11-01 DIAGNOSIS — G4730 Sleep apnea, unspecified: Secondary | ICD-10-CM | POA: Diagnosis not present

## 2015-11-01 DIAGNOSIS — J441 Chronic obstructive pulmonary disease with (acute) exacerbation: Secondary | ICD-10-CM

## 2015-11-01 DIAGNOSIS — Z7951 Long term (current) use of inhaled steroids: Secondary | ICD-10-CM | POA: Diagnosis not present

## 2015-11-01 DIAGNOSIS — Z9981 Dependence on supplemental oxygen: Secondary | ICD-10-CM | POA: Insufficient documentation

## 2015-11-01 DIAGNOSIS — R0602 Shortness of breath: Secondary | ICD-10-CM

## 2015-11-01 DIAGNOSIS — R059 Cough, unspecified: Secondary | ICD-10-CM

## 2015-11-01 DIAGNOSIS — Z79891 Long term (current) use of opiate analgesic: Secondary | ICD-10-CM | POA: Insufficient documentation

## 2015-11-01 DIAGNOSIS — J440 Chronic obstructive pulmonary disease with acute lower respiratory infection: Principal | ICD-10-CM | POA: Insufficient documentation

## 2015-11-01 DIAGNOSIS — Z791 Long term (current) use of non-steroidal anti-inflammatories (NSAID): Secondary | ICD-10-CM | POA: Insufficient documentation

## 2015-11-01 DIAGNOSIS — R05 Cough: Secondary | ICD-10-CM

## 2015-11-01 DIAGNOSIS — R0682 Tachypnea, not elsewhere classified: Secondary | ICD-10-CM | POA: Diagnosis present

## 2015-11-01 DIAGNOSIS — G8929 Other chronic pain: Secondary | ICD-10-CM | POA: Diagnosis not present

## 2015-11-01 DIAGNOSIS — M797 Fibromyalgia: Secondary | ICD-10-CM | POA: Diagnosis not present

## 2015-11-01 LAB — CBC WITH DIFFERENTIAL/PLATELET
BASOS ABS: 0.1 10*3/uL (ref 0–0.1)
Basophils Relative: 1 %
EOS ABS: 0.2 10*3/uL (ref 0–0.7)
EOS PCT: 3 %
HCT: 36 % (ref 35.0–47.0)
HEMOGLOBIN: 12.1 g/dL (ref 12.0–16.0)
LYMPHS ABS: 1.9 10*3/uL (ref 1.0–3.6)
LYMPHS PCT: 28 %
MCH: 30.9 pg (ref 26.0–34.0)
MCHC: 33.5 g/dL (ref 32.0–36.0)
MCV: 92.1 fL (ref 80.0–100.0)
Monocytes Absolute: 1.2 10*3/uL — ABNORMAL HIGH (ref 0.2–0.9)
Monocytes Relative: 18 %
NEUTROS PCT: 50 %
Neutro Abs: 3.3 10*3/uL (ref 1.4–6.5)
PLATELETS: 204 10*3/uL (ref 150–440)
RBC: 3.91 MIL/uL (ref 3.80–5.20)
RDW: 15.4 % — ABNORMAL HIGH (ref 11.5–14.5)
WBC: 6.7 10*3/uL (ref 3.6–11.0)

## 2015-11-01 LAB — MRSA PCR SCREENING: MRSA BY PCR: POSITIVE — AB

## 2015-11-01 LAB — COMPREHENSIVE METABOLIC PANEL
ALK PHOS: 111 U/L (ref 38–126)
ALT: 19 U/L (ref 14–54)
AST: 19 U/L (ref 15–41)
Albumin: 3.7 g/dL (ref 3.5–5.0)
Anion gap: 6 (ref 5–15)
BUN: 15 mg/dL (ref 6–20)
CALCIUM: 9.2 mg/dL (ref 8.9–10.3)
CHLORIDE: 101 mmol/L (ref 101–111)
CO2: 30 mmol/L (ref 22–32)
CREATININE: 0.77 mg/dL (ref 0.44–1.00)
GFR calc Af Amer: >60 mL/min (ref >60)
GFR calc non Af Amer: >60 mL/min (ref >60)
Glucose, Bld: 112 mg/dL — ABNORMAL HIGH (ref 65–99)
Potassium: 4 mmol/L (ref 3.5–5.1)
SODIUM: 137 mmol/L (ref 135–145)
Total Bilirubin: 0.4 mg/dL (ref 0.3–1.2)
Total Protein: 7.2 g/dL (ref 6.5–8.1)

## 2015-11-01 LAB — MAGNESIUM: Magnesium: 2.1 mg/dL (ref 1.7–2.4)

## 2015-11-01 LAB — LACTIC ACID, PLASMA
Lactic Acid, Venous: 1 mmol/L (ref 0.5–1.9)
Lactic Acid, Venous: 1.3 mmol/L (ref 0.5–1.9)

## 2015-11-01 LAB — TROPONIN I

## 2015-11-01 LAB — TSH: TSH: 1.03 u[IU]/mL (ref 0.350–4.500)

## 2015-11-01 LAB — BRAIN NATRIURETIC PEPTIDE: B Natriuretic Peptide: 22 pg/mL (ref 0.0–100.0)

## 2015-11-01 LAB — HEMOGLOBIN A1C: Hgb A1c MFr Bld: 5.2 % (ref 4.0–6.0)

## 2015-11-01 MED ORDER — PROPRANOLOL HCL ER 80 MG PO CP24
80.0000 mg | ORAL_CAPSULE | Freq: Every day | ORAL | Status: DC
  Administered 2015-11-01 – 2015-11-04 (×4): 80 mg via ORAL
  Filled 2015-11-01 (×5): qty 1

## 2015-11-01 MED ORDER — PREDNISONE 20 MG PO TABS
40.0000 mg | ORAL_TABLET | Freq: Every day | ORAL | Status: AC
  Administered 2015-11-03: 40 mg via ORAL
  Filled 2015-11-01: qty 2

## 2015-11-01 MED ORDER — DEXTROSE 5 % IV SOLN
500.0000 mg | INTRAVENOUS | Status: DC
  Filled 2015-11-01: qty 500

## 2015-11-01 MED ORDER — FAMOTIDINE 20 MG PO TABS
20.0000 mg | ORAL_TABLET | Freq: Every day | ORAL | Status: DC
  Administered 2015-11-01 – 2015-11-04 (×4): 20 mg via ORAL
  Filled 2015-11-01 (×4): qty 1

## 2015-11-01 MED ORDER — IPRATROPIUM-ALBUTEROL 0.5-2.5 (3) MG/3ML IN SOLN
3.0000 mL | Freq: Once | RESPIRATORY_TRACT | Status: AC
  Administered 2015-11-01: 3 mL via RESPIRATORY_TRACT
  Filled 2015-11-01: qty 3

## 2015-11-01 MED ORDER — PREGABALIN 75 MG PO CAPS
150.0000 mg | ORAL_CAPSULE | Freq: Two times a day (BID) | ORAL | Status: DC
  Administered 2015-11-01 – 2015-11-04 (×7): 150 mg via ORAL
  Filled 2015-11-01 (×7): qty 2

## 2015-11-01 MED ORDER — METHYLPREDNISOLONE SODIUM SUCC 125 MG IJ SOLR
125.0000 mg | Freq: Once | INTRAMUSCULAR | Status: AC
  Administered 2015-11-01: 125 mg via INTRAVENOUS
  Filled 2015-11-01: qty 2

## 2015-11-01 MED ORDER — DEXTROSE 5 % IV SOLN: 1.0000 g | INTRAVENOUS | Status: DC

## 2015-11-01 MED ORDER — DEXTROSE 5 % IV SOLN: 500.0000 mg | INTRAVENOUS | Status: DC

## 2015-11-01 MED ORDER — LEVOFLOXACIN 750 MG PO TABS: 750.0000 mg | ORAL_TABLET | Freq: Every day | ORAL | Status: DC

## 2015-11-01 MED ORDER — ROPINIROLE HCL 0.25 MG PO TABS
0.7500 mg | ORAL_TABLET | Freq: Every day | ORAL | Status: DC
  Administered 2015-11-01 – 2015-11-03 (×3): 0.75 mg via ORAL
  Filled 2015-11-01 (×3): qty 3

## 2015-11-01 MED ORDER — DOCUSATE SODIUM 100 MG PO CAPS
100.0000 mg | ORAL_CAPSULE | Freq: Two times a day (BID) | ORAL | Status: DC
Start: 2015-11-01 — End: 2015-11-04
  Administered 2015-11-01 – 2015-11-04 (×7): 100 mg via ORAL
  Filled 2015-11-01 (×7): qty 1

## 2015-11-01 MED ORDER — HYDROCODONE-ACETAMINOPHEN 5-325 MG PO TABS
1.0000 | ORAL_TABLET | Freq: Four times a day (QID) | ORAL | Status: DC | PRN
  Administered 2015-11-01 – 2015-11-04 (×10): 1 via ORAL
  Filled 2015-11-01 (×11): qty 1

## 2015-11-01 MED ORDER — ACETAMINOPHEN 650 MG RE SUPP: 650.0000 mg | Freq: Four times a day (QID) | RECTAL | Status: DC | PRN

## 2015-11-01 MED ORDER — LOPERAMIDE HCL 2 MG PO CAPS: 2.0000 mg | ORAL_CAPSULE | ORAL | Status: DC | PRN

## 2015-11-01 MED ORDER — PREDNISONE 10 MG PO TABS: 5.0000 mg | ORAL_TABLET | Freq: Every day | ORAL | Status: DC

## 2015-11-01 MED ORDER — LORAZEPAM 1 MG PO TABS
1.0000 mg | ORAL_TABLET | ORAL | Status: DC | PRN
  Administered 2015-11-03: 16:00:00 1 mg via ORAL
  Filled 2015-11-01: qty 1

## 2015-11-01 MED ORDER — UMECLIDINIUM-VILANTEROL 62.5-25 MCG/INH IN AEPB
1.0000 | INHALATION_SPRAY | Freq: Every day | RESPIRATORY_TRACT | Status: DC
  Administered 2015-11-01 – 2015-11-04 (×4): 1 via RESPIRATORY_TRACT
  Filled 2015-11-01: qty 14

## 2015-11-01 MED ORDER — FUROSEMIDE 40 MG PO TABS
40.0000 mg | ORAL_TABLET | Freq: Two times a day (BID) | ORAL | Status: DC
  Administered 2015-11-01 – 2015-11-04 (×7): 40 mg via ORAL
  Filled 2015-11-01 (×7): qty 1

## 2015-11-01 MED ORDER — CITALOPRAM HYDROBROMIDE 20 MG PO TABS
20.0000 mg | ORAL_TABLET | Freq: Every day | ORAL | Status: DC
  Administered 2015-11-01 – 2015-11-04 (×4): 20 mg via ORAL
  Filled 2015-11-01 (×4): qty 1

## 2015-11-01 MED ORDER — PREDNISONE 20 MG PO TABS
50.0000 mg | ORAL_TABLET | Freq: Every day | ORAL | Status: AC
  Administered 2015-11-02: 50 mg via ORAL
  Filled 2015-11-01: qty 2

## 2015-11-01 MED ORDER — ALBUTEROL SULFATE (2.5 MG/3ML) 0.083% IN NEBU
2.5000 mg | INHALATION_SOLUTION | RESPIRATORY_TRACT | Status: DC | PRN
  Administered 2015-11-01 – 2015-11-04 (×3): 2.5 mg via RESPIRATORY_TRACT
  Filled 2015-11-01 (×3): qty 3

## 2015-11-01 MED ORDER — ACETAMINOPHEN 325 MG PO TABS: 650.0000 mg | ORAL_TABLET | Freq: Four times a day (QID) | ORAL | Status: DC | PRN

## 2015-11-01 MED ORDER — PREDNISONE 20 MG PO TABS
30.0000 mg | ORAL_TABLET | Freq: Every day | ORAL | Status: AC
  Administered 2015-11-04: 09:00:00 30 mg via ORAL
  Filled 2015-11-01: qty 1

## 2015-11-01 MED ORDER — DEXTROSE 5 % IV SOLN
1.0000 g | INTRAVENOUS | Status: DC
  Administered 2015-11-01: 15:00:00 1 g via INTRAVENOUS
  Filled 2015-11-01 (×2): qty 10

## 2015-11-01 MED ORDER — ENOXAPARIN SODIUM 40 MG/0.4ML ~~LOC~~ SOLN
40.0000 mg | SUBCUTANEOUS | Status: DC
  Administered 2015-11-01: 21:00:00 40 mg via SUBCUTANEOUS
  Filled 2015-11-01: qty 0.4

## 2015-11-01 MED ORDER — ONDANSETRON HCL 4 MG/2ML IJ SOLN: 4.0000 mg | Freq: Four times a day (QID) | INTRAMUSCULAR | Status: DC | PRN

## 2015-11-01 MED ORDER — GUAIFENESIN-DM 100-10 MG/5ML PO SYRP
5.0000 mL | ORAL_SOLUTION | ORAL | Status: DC | PRN
  Administered 2015-11-01 (×2): 10 mL via ORAL
  Administered 2015-11-02: 16:00:00 5 mL via ORAL
  Administered 2015-11-02 – 2015-11-04 (×5): 10 mL via ORAL
  Filled 2015-11-01 (×9): qty 10

## 2015-11-01 MED ORDER — FLUTICASONE PROPIONATE 50 MCG/ACT NA SUSP
2.0000 | Freq: Every day | NASAL | Status: DC
  Administered 2015-11-01 – 2015-11-04 (×4): 2 via NASAL
  Filled 2015-11-01: qty 16

## 2015-11-01 MED ORDER — AZITHROMYCIN 500 MG PO TABS
500.0000 mg | ORAL_TABLET | ORAL | Status: DC
  Administered 2015-11-02 – 2015-11-04 (×3): 500 mg via ORAL
  Filled 2015-11-01 (×3): qty 1

## 2015-11-01 MED ORDER — COLESEVELAM HCL 625 MG PO TABS
625.0000 mg | ORAL_TABLET | Freq: Two times a day (BID) | ORAL | Status: DC
  Administered 2015-11-01 – 2015-11-04 (×7): 625 mg via ORAL
  Filled 2015-11-01 (×7): qty 1

## 2015-11-01 MED ORDER — PANTOPRAZOLE SODIUM 40 MG PO TBEC
40.0000 mg | DELAYED_RELEASE_TABLET | Freq: Every day | ORAL | Status: DC
  Administered 2015-11-01 – 2015-11-04 (×4): 40 mg via ORAL
  Filled 2015-11-01 (×4): qty 1

## 2015-11-01 MED ORDER — AZITHROMYCIN 500 MG PO TABS
500.0000 mg | ORAL_TABLET | ORAL | Status: DC
  Administered 2015-11-01: 500 mg via ORAL
  Filled 2015-11-01: qty 1

## 2015-11-01 MED ORDER — ONDANSETRON HCL 4 MG PO TABS: 4.0000 mg | ORAL_TABLET | Freq: Four times a day (QID) | ORAL | Status: DC | PRN

## 2015-11-01 MED ORDER — OXYBUTYNIN CHLORIDE 5 MG PO TABS
5.0000 mg | ORAL_TABLET | Freq: Every day | ORAL | Status: DC
  Administered 2015-11-01 – 2015-11-04 (×4): 5 mg via ORAL
  Filled 2015-11-01 (×5): qty 1

## 2015-11-01 MED ORDER — PREDNISONE 20 MG PO TABS
20.0000 mg | ORAL_TABLET | Freq: Every day | ORAL | Status: DC
Start: 2015-11-05 — End: 2015-11-04

## 2015-11-01 MED ORDER — PREDNISONE 10 MG PO TABS: 10.0000 mg | ORAL_TABLET | Freq: Every day | ORAL | Status: DC

## 2015-11-01 MED ORDER — DEXTROSE 5 % IV SOLN
1.0000 g | INTRAVENOUS | Status: DC
  Administered 2015-11-02 – 2015-11-03 (×2): 1 g via INTRAVENOUS
  Filled 2015-11-01 (×2): qty 10

## 2015-11-01 MED ORDER — MAGNESIUM OXIDE 400 (241.3 MG) MG PO TABS
400.0000 mg | ORAL_TABLET | Freq: Every day | ORAL | Status: DC
  Administered 2015-11-01 – 2015-11-04 (×4): 400 mg via ORAL
  Filled 2015-11-01 (×4): qty 1

## 2015-11-01 MED ORDER — VITAMIN D 1000 UNITS PO TABS
1000.0000 [IU] | ORAL_TABLET | Freq: Every day | ORAL | Status: DC
  Administered 2015-11-01 – 2015-11-04 (×4): 1000 [IU] via ORAL
  Filled 2015-11-01 (×4): qty 1

## 2015-11-01 NOTE — ED Provider Notes (Signed)
[facility] Emergency Department Provider Note  ____________________________________________   First MD Initiated Contact with Patient 11/01/15 [phone]     (approximate)  I have reviewed the triage vital signs and the nursing notes.   HISTORY  Chief Complaint Chest Pain and Shortness of Breath    HPI Loretta Wade is a 65 y.o. female with extensive chronic medical hx who presents by EMS for evaluation of CP and SOB.  She has a history of COPD and CHF and she is also followed by pain management for fibromyalgia.  She is morbidly obese and uses 2 L of home oxygen at all times.  She reports that she was seen by her pulmonologist, Dr. Raul Del, and started on doxycycline presumably for community-acquired pneumonia.  She then followed up with her primary care doctor and was also started on moxifloxacin about 3 days ago.  She called EMS tonight because her shortness of breath have gotten worse and is severe even at rest.  She also has wheezing.  Her regular medications including her breathing treatments are not helping.  She is also having intermittent sharp stabbing chest pain that is mild to moderate in severity.  Nothing is making it better or worse.  She also reports some swelling in her legs recently.  She denies fever/chills, abdominal pain, vomiting, dysuria.   Past Medical History:  Diagnosis Date  . Anemia   . CHF (congestive heart failure) ([place])   . COPD (chronic obstructive pulmonary disease) (HCC)    on 2L home o2  . Edema   . Esophageal reflux   . Fatigue   . Fibromyalgia    Following with pain management  . Hypertension   . Nocturia   . OA (osteoarthritis)   . Sleep apnea    not on CPAP    Patient Active Problem List   Diagnosis Date Noted  . Acute non-recurrent maxillary sinusitis 10/29/2015  . Fall 07/11/2015  . Dyslipidemia 06/17/2015  . Overflow stress incontinence of urine in female 06/17/2015  . Acute sinusitis treated with  antibiotics in the past 60 days 06/17/2015  . Chronic pain 06/10/2015  . Chronic low back pain (Location of Tertiary source of pain) (Bilateral) (R>L) 06/10/2015  . Chronic shoulder pain (Location of Secondary source of pain) (Left) 06/10/2015  . Costochondritis (Location of Primary Source of Pain) 06/10/2015  . Long term current use of opiate analgesic 06/10/2015  . Long term prescription opiate use 06/10/2015  . Opiate use 06/10/2015  . Encounter for therapeutic drug level monitoring 06/10/2015  . Encounter for pain management planning 06/10/2015  . Chronic feet pain (Bilateral) (R>L) 06/10/2015  . Osteoarthritis 06/10/2015  . Osteoarthritis of shoulder (Left) 06/10/2015  . COPD exacerbation ([place]) 04/12/2015  . Poor mobility 04/12/2015  . Fibromyalgia 03/23/2015  . GERD (gastroesophageal reflux disease) 03/23/2015  . MRSA infection 11/17/2014  . Diastolic CHF, acute on chronic (HCC) 11/12/2014  . Morbid obesity with BMI of 50.0-59.9, adult ([place]) 11/12/2014  . Neck pain, chronic 11/12/2014  . COPD, frequent exacerbations ([place]) 11/04/2014    Past Surgical History:  Procedure Laterality Date  . ABDOMINAL HYSTERECTOMY    . CHOLECYSTECTOMY    . TOTAL KNEE ARTHROPLASTY     right  . VESICOVAGINAL FISTULA CLOSURE W/ TAH      Prior to Admission medications   Medication Sig Start Date End Date Taking? Authorizing Provider  acetaminophen (TYLENOL) 325 MG tablet Take 2 tablets (650 mg total) by mouth every 6 (six) hours as  needed for mild pain (or Fever >/= 101). 11/17/14   Gladstone Lighter, MD  albuterol (PROVENTIL HFA;VENTOLIN HFA) 108 (90 Base) MCG/ACT inhaler Inhale 2 puffs into the lungs every 6 (six) hours as needed for wheezing or shortness of breath. 09/15/15   Roselee Nova, MD  albuterol (PROVENTIL) (2.5 MG/3ML) 0.083% nebulizer solution USE ONE TREATMENT EVERY 6 HOURS AS NEEDED FOR WHEEZING OR SHORTNESS OF BREATH 09/20/15   Roselee Nova, MD  budesonide (PULMICORT) 0.5  MG/2ML nebulizer solution Take 0.5 mg by nebulization 2 (two) times daily.     Historical Provider, MD  Chlorphen-Phenyleph-APAP (CORICIDIN D COLD/FLU/SINUS) 2-5-325 MG TABS Take 2 tablets by mouth daily as needed (for nasal congestion/cough).    Historical Provider, MD  cholecalciferol (VITAMIN D) 1000 UNITS tablet Take 1,000 Units by mouth daily.     Historical Provider, MD  citalopram (CELEXA) 20 MG tablet Take 20 mg by mouth daily.    Historical Provider, MD  colesevelam (WELCHOL) 625 MG tablet Take 1 tablet (625 mg total) by mouth 2 (two) times daily with a meal. 06/17/15   Roselee Nova, MD  dexlansoprazole (DEXILANT) 60 MG capsule Take 1 capsule (60 mg total) by mouth daily. 09/09/15   Roselee Nova, MD  fluticasone (FLONASE) 50 MCG/ACT nasal spray Place 2 sprays into both nostrils daily as needed for rhinitis.    Historical Provider, MD  fluticasone furoate-vilanterol (BREO ELLIPTA) 100-25 MCG/INH AEPB Inhale 1 puff into the lungs daily. 05/19/15   Roselee Nova, MD  furosemide (LASIX) 40 MG tablet Take 1 tablet (40 mg total) by mouth 2 (two) times daily. 02/08/15   Dustin Flock, MD  guaiFENesin-dextromethorphan (ROBITUSSIN DM) 100-10 MG/5ML syrup Take 5-10 mLs by mouth every 4 (four) hours as needed for cough.     Historical Provider, MD  Hydrocodone-Acetaminophen 7.5-300 MG TABS Take 1 tablet by mouth every 6 (six) hours as needed. 10/29/15   Roselee Nova, MD  loperamide (IMODIUM) 2 MG capsule Take 2-4 mg by mouth as needed for diarrhea or loose stools.    Historical Provider, MD  Magnesium 250 MG TABS Take 250 mg by mouth daily.    Historical Provider, MD  morphine (MS CONTIN) 15 MG 12 hr tablet Take 1 tablet (15 mg total) by mouth 2 (two) times daily. **patient takes with 30 mg tablet** 07/14/15   Dustin Flock, MD  morphine (MS CONTIN) 30 MG 12 hr tablet Take 1 tablet (30 mg total) by mouth 2 (two) times daily. **patient takes with 15 mg tablet** 07/14/15   Dustin Flock, MD    moxifloxacin (AVELOX) 400 MG tablet Take 1 tablet (400 mg total) by mouth daily at 8 pm. 10/29/15   Roselee Nova, MD  oxybutynin (DITROPAN) 5 MG tablet Take 1 tablet (5 mg total) by mouth daily. 06/17/15   Roselee Nova, MD  pantoprazole (PROTONIX) 40 MG tablet TAKE ONE (1) TABLET BY MOUTH TWO (2) TIMES DAILY 09/20/15   Roselee Nova, MD  predniSONE (DELTASONE) 10 MG tablet Take 10 mg by mouth daily with breakfast.    Historical Provider, MD  pregabalin (LYRICA) 150 MG capsule Take 1 capsule (150 mg total) by mouth 2 (two) times daily. 09/09/15   Roselee Nova, MD  propranolol ER (INDERAL LA) 80 MG 24 hr capsule TAKE ONE CAPSULE BY MOUTH DAILY 10/18/15   Roselee Nova, MD  ranitidine (ZANTAC) 150 MG tablet Take  1 tablet (150 mg total) by mouth 2 (two) times daily. 10/29/15   Roselee Nova, MD  rOPINIRole (REQUIP) 0.25 MG tablet Take 3 tablets (0.75 mg total) by mouth at bedtime. 01/26/15   Roselee Nova, MD  rOPINIRole (REQUIP) 0.25 MG tablet TAKE THREE TABLETS BY MOUTH EVERY NIGHT AT BEDTIME 10/14/15   Roselee Nova, MD  tiotropium (SPIRIVA) 18 MCG inhalation capsule Place 1 capsule (18 mcg total) into inhaler and inhale daily. 05/19/15   Roselee Nova, MD    Allergies Review of patient's allergies indicates no known allergies.  Family History  Problem Relation Age of Onset  . Hypertension Mother   . Diabetes    . Breast cancer Daughter     Social History Social History  Substance Use Topics  . Smoking status: Former Smoker    Quit date: 11/04/1983  . Smokeless tobacco: Never Used     Comment: quit several years ago - almost 30 years ago  . Alcohol use No    Review of Systems Constitutional: No fever/chills Eyes: No visual changes. ENT: No sore throat. Cardiovascular: +chest pain. Respiratory: +shortness of breath. Gastrointestinal: No abdominal pain.  No nausea, no vomiting.  No diarrhea.  No constipation. Genitourinary: Negative for  dysuria. Musculoskeletal: Negative for back pain. Skin: Negative for rash. Neurological: Negative for headaches, focal weakness or numbness.  10-point ROS otherwise negative.  ____________________________________________   PHYSICAL EXAM:  VITAL SIGNS: ED Triage Vitals  Enc Vitals Group     BP 11/01/15 0219 135/63     Pulse Rate 11/01/15 0219 69     Resp 11/01/15 0219 (!) 28     Temp 11/01/15 0219 98.2 F (36.8 C)     Temp Source 11/01/15 0219 Oral     SpO2 11/01/15 0208 94 %     Weight 11/01/15 0220 (!) 335 lb (152 kg)     Height 11/01/15 0220 5\' 4"  (1.626 m)     Head Circumference --      Peak Flow --      Pain Score 11/01/15 0221 8     Pain Loc --      Pain Edu? --      Excl. in [place]? --     Constitutional: Alert and oriented. Obese, appearance of chronic illness, mild to moderate respiratory distress Eyes: Conjunctivae are normal. PERRL. EOMI. Head: Atraumatic. Nose: No congestion/rhinnorhea. Mouth/Throat: Mucous membranes are moist.  Oropharynx non-erythematous. Neck: No stridor.  No meningeal signs.   Cardiovascular: Normal rate, regular rhythm. Good peripheral circulation.  Heart sounds difficult to appreciate given body habitus Respiratory: Increased respiratory effort with tachypnea in the 30s, audible wheezing, and accessory muscle usage Gastrointestinal: Soft and nontender. No distention.  Musculoskeletal: 1+ pitting edema bilaterally. No gross deformities of extremities. Neurologic:  Normal speech and language. No gross focal neurologic deficits are appreciated.  Skin:  Skin is warm, dry and intact. No rash noted. Psychiatric: Mood and affect are normal. Speech and behavior are normal.  ____________________________________________   LABS (all labs ordered are listed, but only abnormal results are displayed)  Labs Reviewed  CBC WITH DIFFERENTIAL/PLATELET - Abnormal; Notable for the following:       Result Value   RDW 15.4 (*)    Monocytes Absolute 1.2  (*)    All other components within normal limits  COMPREHENSIVE METABOLIC PANEL - Abnormal; Notable for the following:    Glucose, Bld 112 (*)    All other components within  normal limits  CULTURE, BLOOD (ROUTINE X 2)  CULTURE, BLOOD (ROUTINE X 2)  TROPONIN I  MAGNESIUM  LACTIC ACID, PLASMA  BRAIN NATRIURETIC PEPTIDE  LACTIC ACID, PLASMA   ____________________________________________  EKG  ED ECG REPORT I, Liboria Putnam, the attending physician, personally viewed and interpreted this ECG.  Date: 11/01/2015 EKG Time: 02:17 Rate: 65 Rhythm: normal sinus rhythm QRS Axis: normal Intervals: normal ST/T Wave abnormalities: Non-specific ST segment / T-wave changes, but no evidence of acute ischemia. Conduction Disturbances: none Narrative Interpretation: unremarkable  ____________________________________________  RADIOLOGY   Dg Chest Portable 1 View  Result Date: 11/01/2015 CLINICAL DATA: Shortness of breath and chest pain for 1 day. EXAM: PORTABLE CHEST 1 VIEW COMPARISON:  Chest radiograph May 12, 2015 FINDINGS: Cardiac silhouette appears mildly enlarged and unchanged. Mild bronchitic change without pleural effusion or focal consolidation. No pneumothorax. Large body habitus. Osseous structures are nonsuspicious. IMPRESSION: Mild cardiomegaly and similar bronchitic changes. Electronically Signed   By: Elon Alas M.D.   On: 11/01/2015 03:11    ____________________________________________   PROCEDURES  Procedure(s) performed:   Procedures   Critical Care performed: No ____________________________________________   INITIAL IMPRESSION / ASSESSMENT AND PLAN / ED COURSE  Pertinent labs & imaging results that were available during my care of the patient were reviewed by me and considered in my medical decision making (see chart for details).  I will evaluate the patient broadly for CHF, COPD, ACS, and start treatment for COPD as I think this is most likely.  I  will reassess after DuoNeb's and solumedrol.   Clinical Course  Comment By Time  Workup has been unremarkable with labs all within normal limits including a low BNP, lactate of 1.0, negative troponin, and a chest x-ray that is unremarkable.  I will continue to treat for COPD but I anticipate outpatient management. Hinda Kehr, MD 08/28 0345  I reassessed the patient after her 3 breathing treatments and Solu-Medrol.  She is still using accessory muscles and audibly wheezing although upon auscultation her lungs have improved.  I explained to her the reassuring results but she is still points out that she is breathing too fast and too hard and wheezing.  I will discuss the case with the hospitalist for admission for COPD treatment. Hinda Kehr, MD 08/28 [phone]    ____________________________________________  FINAL CLINICAL IMPRESSION(S) / ED DIAGNOSES  Final diagnoses:  COPD exacerbation ([place])  Shortness of breath     MEDICATIONS GIVEN DURING THIS VISIT:  Medications  ipratropium-albuterol (DUONEB) 0.5-2.5 (3) MG/3ML nebulizer solution 3 mL (3 mLs Nebulization Given 11/01/15 0253)  ipratropium-albuterol (DUONEB) 0.5-2.5 (3) MG/3ML nebulizer solution 3 mL (3 mLs Nebulization Given 11/01/15 0448)  ipratropium-albuterol (DUONEB) 0.5-2.5 (3) MG/3ML nebulizer solution 3 mL (3 mLs Nebulization Given 11/01/15 0448)  methylPREDNISolone sodium succinate (SOLU-MEDROL) 125 mg/2 mL injection 125 mg (125 mg Intravenous Given 11/01/15 0448)     NEW OUTPATIENT MEDICATIONS STARTED DURING THIS VISIT:  New Prescriptions   No medications on file      Note:  This document was prepared using Dragon voice recognition software and may include unintentional dictation errors.    Hinda Kehr, MD 11/01/15 [phone]

## 2015-11-01 NOTE — H&P (Signed)
Loretta Wade is an 65 y.o. female.   Chief Complaint: Shortness of breath HPI: The patient with past medical history of COPD and CHF presents to emergency department complaining of shortness of breath. Her dyspnea has been worsening over the last 2 weeks. She uses oxygen intermittently at home but has had to use it more frequently. She states that she has had a cough productive of green sputum. She has seen her primary care doctor who started her on Levaquin but she reports no improvement. The patient admits to subjective fevers but denies nausea, vomiting or diarrhea. She noticed that she has a recurrence of edema in her lower extremities which reportedly has not been a problem for quite some time. Notably, the patient complains of chronic intermittent chest pain that seems to be worse lately. She describes the pain as a burning or inflammatory sensation in her upper chest. It does not radiate. Laboratory evaluation the emergency department was reassuring, however the patient continued to have tachypnea and chest discomfort which prompted the emergency department staff to call the hospitalist service for further management.  Past Medical History:  Diagnosis Date  . Anemia   . CHF (congestive heart failure) ([place])   . COPD (chronic obstructive pulmonary disease) ([facility])    on 2L home o2  . Edema   . Esophageal reflux   . Fatigue   . Fibromyalgia    Following with pain management  . Hypertension   . Nocturia   . OA (osteoarthritis)   . Sleep apnea    not on CPAP    Past Surgical History:  Procedure Laterality Date  . ABDOMINAL HYSTERECTOMY    . CHOLECYSTECTOMY    . TOTAL KNEE ARTHROPLASTY     right  . VESICOVAGINAL FISTULA CLOSURE W/ TAH      Family History  Problem Relation Age of Onset  . Hypertension Mother   . Diabetes    . Breast cancer Daughter    Social History:  reports that she quit smoking about 32 years ago. She has never used smokeless tobacco. She reports that she  does not drink alcohol or use drugs.  Allergies: No Known Allergies  Prior to Admission medications   Medication Sig Start Date End Date Taking? Authorizing Provider  acetaminophen (TYLENOL) 325 MG tablet Take 2 tablets (650 mg total) by mouth every 6 (six) hours as needed for mild pain (or Fever >/= 101). 11/17/14  Yes Gladstone Lighter, MD  albuterol (PROVENTIL) (2.5 MG/3ML) 0.083% nebulizer solution USE ONE TREATMENT EVERY 6 HOURS AS NEEDED FOR WHEEZING OR SHORTNESS OF BREATH 09/20/15  Yes Syed Richmond Campbell, MD  ANORO ELLIPTA 62.5-25 MCG/INH AEPB Inhale 1 puff into the lungs daily. 10/06/15  Yes Historical Provider, MD  Chlorphen-Phenyleph-APAP (CORICIDIN D COLD/FLU/SINUS) 2-5-325 MG TABS Take 2 tablets by mouth daily as needed (for nasal congestion/cough).   Yes Historical Provider, MD  cholecalciferol (VITAMIN D) 1000 UNITS tablet Take 1,000 Units by mouth daily.    Yes Historical Provider, MD  citalopram (CELEXA) 20 MG tablet Take 20 mg by mouth daily.   Yes Historical Provider, MD  colesevelam (WELCHOL) 625 MG tablet Take 1 tablet (625 mg total) by mouth 2 (two) times daily with a meal. 06/17/15  Yes Roselee Nova, MD  dexlansoprazole (DEXILANT) 60 MG capsule Take 1 capsule (60 mg total) by mouth daily. 09/09/15  Yes Roselee Nova, MD  fluticasone (FLONASE) 50 MCG/ACT nasal spray Place 2 sprays into both nostrils daily.  Yes Historical Provider, MD  furosemide (LASIX) 40 MG tablet Take 1 tablet (40 mg total) by mouth 2 (two) times daily. 02/08/15  Yes Dustin Flock, MD  guaiFENesin-dextromethorphan (ROBITUSSIN DM) 100-10 MG/5ML syrup Take 5-10 mLs by mouth every 4 (four) hours as needed for cough.    Yes Historical Provider, MD  Hydrocodone-Acetaminophen 7.5-300 MG TABS Take 1 tablet by mouth every 6 (six) hours as needed. Patient taking differently: Take 1 tablet by mouth every 6 (six) hours as needed (pain).  10/29/15  Yes Roselee Nova, MD  loperamide (IMODIUM) 2 MG capsule Take 2-4 mg  by mouth as needed for diarrhea or loose stools.   Yes Historical Provider, MD  Magnesium 250 MG TABS Take 250 mg by mouth daily.   Yes Historical Provider, MD  moxifloxacin (AVELOX) 400 MG tablet Take 1 tablet (400 mg total) by mouth daily at 8 pm. 10/29/15  Yes Roselee Nova, MD  oxybutynin (DITROPAN) 5 MG tablet Take 1 tablet (5 mg total) by mouth daily. 06/17/15  Yes Roselee Nova, MD  pantoprazole (PROTONIX) 40 MG tablet TAKE ONE (1) TABLET BY MOUTH TWO (2) TIMES DAILY 09/20/15  Yes Roselee Nova, MD  pregabalin (LYRICA) 150 MG capsule Take 1 capsule (150 mg total) by mouth 2 (two) times daily. 09/09/15  Yes Roselee Nova, MD  propranolol ER (INDERAL LA) 80 MG 24 hr capsule TAKE ONE CAPSULE BY MOUTH DAILY 10/18/15  Yes Roselee Nova, MD  ranitidine (ZANTAC) 150 MG tablet Take 1 tablet (150 mg total) by mouth 2 (two) times daily. 10/29/15  Yes Roselee Nova, MD  rOPINIRole (REQUIP) 0.25 MG tablet Take 3 tablets (0.75 mg total) by mouth at bedtime. 01/26/15  Yes Roselee Nova, MD     Results for orders placed or performed during the hospital encounter of 11/01/15 (from the past 48 hour(s))  Troponin I     Status: None   Collection Time: 11/01/15  2:24 AM  Result Value Ref Range   Troponin I <0.03 <0.03 ng/mL  CBC with Differential/Platelet     Status: Abnormal   Collection Time: 11/01/15  2:24 AM  Result Value Ref Range   WBC 6.7 3.6 - 11.0 K/uL   RBC 3.91 3.80 - 5.20 MIL/uL   Hemoglobin 12.1 12.0 - 16.0 g/dL   HCT 36.0 35.0 - 47.0 %   MCV 92.1 80.0 - 100.0 fL   MCH 30.9 26.0 - 34.0 pg   MCHC 33.5 32.0 - 36.0 g/dL   RDW 15.4 (H) 11.5 - 14.5 %   Platelets 204 150 - 440 K/uL   Neutrophils Relative % 50 %   Neutro Abs 3.3 1.4 - 6.5 K/uL   Lymphocytes Relative 28 %   Lymphs Abs 1.9 1.0 - 3.6 K/uL   Monocytes Relative 18 %   Monocytes Absolute 1.2 (H) 0.2 - 0.9 K/uL   Eosinophils Relative 3 %   Eosinophils Absolute 0.2 0 - 0.7 K/uL   Basophils Relative 1 %    Basophils Absolute 0.1 0 - 0.1 K/uL  Comprehensive metabolic panel     Status: Abnormal   Collection Time: 11/01/15  2:24 AM  Result Value Ref Range   Sodium 137 135 - 145 mmol/L   Potassium 4.0 3.5 - 5.1 mmol/L   Chloride 101 101 - 111 mmol/L   CO2 30 22 - 32 mmol/L   Glucose, Bld 112 (H) 65 - 99 mg/dL  BUN 15 6 - 20 mg/dL   Creatinine, Ser 0.77 0.44 - 1.00 mg/dL   Calcium 9.2 8.9 - 10.3 mg/dL   Total Protein 7.2 6.5 - 8.1 g/dL   Albumin 3.7 3.5 - 5.0 g/dL   AST 19 15 - 41 U/L   ALT 19 14 - 54 U/L   Alkaline Phosphatase 111 38 - 126 U/L   Total Bilirubin 0.4 0.3 - 1.2 mg/dL   GFR calc non Af Amer >60 >60 mL/min   GFR calc Af Amer >60 >60 mL/min    Comment: (NOTE) The eGFR has been calculated using the CKD EPI equation. This calculation has not been validated in all clinical situations. eGFR's persistently <60 mL/min signify possible Chronic Kidney Disease.    Anion gap 6 5 - 15  Magnesium     Status: None   Collection Time: 11/01/15  2:24 AM  Result Value Ref Range   Magnesium 2.1 1.7 - 2.4 mg/dL  Lactic acid, plasma     Status: None   Collection Time: 11/01/15  2:24 AM  Result Value Ref Range   Lactic Acid, Venous 1.0 0.5 - 1.9 mmol/L  Brain natriuretic peptide     Status: None   Collection Time: 11/01/15  2:24 AM  Result Value Ref Range   B Natriuretic Peptide 22.0 0.0 - 100.0 pg/mL   Dg Chest Portable 1 View  Result Date: 11/01/2015 CLINICAL DATA: Shortness of breath and chest pain for 1 day. EXAM: PORTABLE CHEST 1 VIEW COMPARISON:  Chest radiograph May 12, 2015 FINDINGS: Cardiac silhouette appears mildly enlarged and unchanged. Mild bronchitic change without pleural effusion or focal consolidation. No pneumothorax. Large body habitus. Osseous structures are nonsuspicious. IMPRESSION: Mild cardiomegaly and similar bronchitic changes. Electronically Signed   By: Elon Alas M.D.   On: 11/01/2015 03:11    Review of Systems  Constitutional: Negative for chills  and fever.  HENT: Negative for sore throat and tinnitus.   Eyes: Negative for blurred vision and redness.  Respiratory: Positive for cough, sputum production, shortness of breath and wheezing.   Cardiovascular: Negative for chest pain ("inflammation" sometimes like "pin pricks"), palpitations, orthopnea and PND.  Gastrointestinal: Negative for abdominal pain, diarrhea, nausea and vomiting.  Genitourinary: Negative for dysuria, frequency and urgency.  Musculoskeletal: Negative for joint pain and myalgias.  Skin: Negative for rash.       No lesions  Neurological: Negative for speech change, focal weakness and weakness.  Endo/Heme/Allergies: Does not bruise/bleed easily.       No temperature intolerance  Psychiatric/Behavioral: Negative for depression and suicidal ideas.    Blood pressure 131/77, pulse 73, temperature 98.2 F (36.8 C), temperature source Oral, resp. rate (!) 21, height 5' 4"  (1.626 m), weight (!) 152 kg (335 lb), SpO2 95 %. Physical Exam  Vitals reviewed. Constitutional: She is oriented to person, place, and time. She appears well-developed and well-nourished. No distress.  HENT:  Head: Normocephalic and atraumatic.  Mouth/Throat: Oropharynx is clear and moist.  Eyes: Conjunctivae and EOM are normal. Pupils are equal, round, and reactive to light. No scleral icterus.  Neck: Normal range of motion. Neck supple. No JVD present. No tracheal deviation present. No thyromegaly present.  Cardiovascular: Normal rate, regular rhythm and normal heart sounds.  Exam reveals no gallop and no friction rub.   No murmur heard. Respiratory: Effort normal and breath sounds normal. No respiratory distress.  GI: Soft. Bowel sounds are normal. She exhibits no distension. There is no tenderness.  Genitourinary:  Genitourinary Comments: Deferred  Musculoskeletal: Normal range of motion. She exhibits edema.  Lymphadenopathy:    She has no cervical adenopathy.  Neurological: She is alert and  oriented to person, place, and time. No cranial nerve deficit. She exhibits normal muscle tone.  Skin: Skin is warm and dry. No rash noted. No erythema.  Psychiatric: She has a normal mood and affect. Her behavior is normal. Judgment and thought content normal.     Assessment/Plan This is a 65 year old female admitted for tachypnea. 1. Tachypnea: Secondary to COPD exacerbation. Differential diagnosis includes pneumonia as patient states that she has cough productive of green sputum. Continue supplemental oxygen as needed (currently sats are good on home rate of 2L of oxygen via nasal canula). She denies sinus drainage or pressure. Chest x-ray is clear however her cardiac silhouette is dramatically enlarged. Given her lower extremity edema order echocardiogram. The patient is hemodynamically stable but a viral etiology to her cough and possible decrease in cardiac output is also part of the differential diagnosis. 2. CHF: Diastolic. Last EF 55-65% September 2016. Mild aortic regurg noted.  3. Morbid obesity: BMI 57.6; encourage diet and exercise 4. DVT prophylaxis: Lovenox 5. GI prophylaxis: None The patient is a full code. Time spent on admission orders and patient care approximately 45 minutes  Harrie Foreman, MD 11/01/2015, 6:39 AM

## 2015-11-01 NOTE — ED Triage Notes (Signed)
EMS pt to rm 18 from home with report of pneumonia dx on 10/12/15. Pt has had 2 different antibotics from her pulmonologist and one from her pcp. Tonight pt has increased shortness of breath and developed chest pain on Sunday. Pt also co pain to BLE and reports swelling to both below her knees.

## 2015-11-01 NOTE — Care Management Obs Status (Signed)
[facility] NOTIFICATION   Patient Details  Name: Loretta Wade MRN: [id number] Date of Birth: 01/02/1951   Medicare Observation Status Notification Given:  Yes    Katrina Stack, RN 11/01/2015, 8:58 AM

## 2015-11-01 NOTE — Progress Notes (Signed)
*  PRELIMINARY RESULTS* Echocardiogram 2D Echocardiogram has been performed.  Loretta Wade 11/01/2015, 3:13 PM

## 2015-11-01 NOTE — ED Notes (Signed)
AAOx3.  Skin warm and dry.  NAD 

## 2015-11-01 NOTE — Progress Notes (Signed)
   11/01/15 1000  Clinical Encounter Type  Visited With Patient  Visit Type Initial  Referral From Nurse  Consult/Referral To Chaplain  Spiritual Encounters  Spiritual Needs Literature;Prayer  Stress Factors  Patient Stress Factors Exhausted;Health changes  Visited patient to provide AD education. Gonzala is not interested in pursuing an AD at this time. I provided materials and advise that if she reconsiders then she is welcome to have nurse page the chaplain and I will return. We discussed her current diagnosis and shared in prayer.  Chap. Keirston Saephanh G. [place]

## 2015-11-01 NOTE — Care Management (Signed)
Patient placed in observation for shortness of breath and tachycardia due to copd. 02 sats have been stable on  2 liters 02.  Patient lives with her son and has PCS services 5 days a week. she has chronic 02 through [place].  Uses a walker for ambulation.  Says that she sometimes has problems with transportation to appointment.  Provided information regarding [facility]

## 2015-11-01 NOTE — Progress Notes (Signed)
[facility] [place] NAME: Loretta Wade    MR#:  [id number]  DATE OF BIRTH:  1950/08/23  SUBJECTIVE:  CHIEF COMPLAINT:   Chief Complaint  Patient presents with  . Chest Pain  . Shortness of Breath   Severe cough, better shortness breath. REVIEW OF SYSTEMS:  Review of Systems  Constitutional: Positive for malaise/fatigue. Negative for chills and fever.  HENT: Negative for congestion and sore throat.   Eyes: Negative for blurred vision and double vision.  Respiratory: Positive for cough, sputum production, shortness of breath and wheezing. Negative for stridor.   Cardiovascular: Positive for leg swelling. Negative for chest pain, palpitations and orthopnea.  Gastrointestinal: Negative for abdominal pain, blood in stool, constipation, diarrhea, melena, nausea and vomiting.  Genitourinary: Negative for dysuria and urgency.  Musculoskeletal: Negative for joint pain.  Skin: Negative for itching and rash.  Neurological: Positive for weakness. Negative for dizziness, tingling, focal weakness and headaches.  Psychiatric/Behavioral: Negative for depression.    DRUG ALLERGIES:  No Known Allergies VITALS:  Blood pressure (!) 117/50, pulse 81, temperature 98.6 F (37 C), resp. rate 20, height 5\' 4"  (1.626 m), weight (!) 331 lb 1.6 oz (150.2 kg), SpO2 93 %. PHYSICAL EXAMINATION:  Physical Exam  Constitutional: She is oriented to person, place, and time and well-developed, well-nourished, and in no distress. No distress.  HENT:  Head: Normocephalic and atraumatic.  Mouth/Throat: Oropharynx is clear and moist.  Eyes: Conjunctivae and EOM are normal. Pupils are equal, round, and reactive to light. No scleral icterus.  Neck: No JVD present. No tracheal deviation present. No thyromegaly present.  Cardiovascular: Normal rate, regular rhythm and normal heart sounds.  Exam reveals no gallop.   No murmur heard. Pulmonary/Chest: Effort normal. No  respiratory distress. She has wheezes. She has no rales.  Abdominal: Soft. Bowel sounds are normal. She exhibits no distension. There is no tenderness.  Musculoskeletal: Normal range of motion. She exhibits edema. She exhibits no tenderness.  Lymphadenopathy:    She has no cervical adenopathy.  Neurological: She is alert and oriented to person, place, and time. No cranial nerve deficit.  Skin: Skin is warm and dry.  Psychiatric: Mood, memory, affect and judgment normal.   LABORATORY PANEL:   CBC  Recent Labs Lab 11/01/15 0224  WBC 6.7  HGB 12.1  HCT 36.0  PLT 204   ------------------------------------------------------------------------------------------------------------------ Chemistries   Recent Labs Lab 11/01/15 0224  NA 137  K 4.0  CL 101  CO2 30  GLUCOSE 112*  BUN 15  CREATININE 0.77  CALCIUM 9.2  MG 2.1  AST 19  ALT 19  ALKPHOS 111  BILITOT 0.4   RADIOLOGY:  Dg Chest Portable 1 View  Result Date: 11/01/2015 CLINICAL DATA: Shortness of breath and chest pain for 1 day. EXAM: PORTABLE CHEST 1 VIEW COMPARISON:  Chest radiograph May 12, 2015 FINDINGS: Cardiac silhouette appears mildly enlarged and unchanged. Mild bronchitic change without pleural effusion or focal consolidation. No pneumothorax. Large body habitus. Osseous structures are nonsuspicious. IMPRESSION: Mild cardiomegaly and similar bronchitic changes. Electronically Signed   By: Elon Alas M.D.   On: 11/01/2015 03:11   ASSESSMENT AND PLAN:   This is a 65 year old female admitted for tachypnea. 1. Tachypnea: Secondary to COPD exacerbation and acute bronchitis.   Continue home oxygen 2L [place]. Neb prn. Taper prednisone.Continue Zithromax and Rocephin.   * Chronic respiratory failure. Continue home oxygen.  2. CHF: Diastolic. Last EF 55-65% September 2016. Mild  aortic regurg noted. F/u echo. 3. Morbid obesity: BMI 57.6; encourage diet and exercise  All the records are reviewed and case  discussed with Care Management/Social Worker. Management plans discussed with the patient, family and they are in agreement.  CODE STATUS:   TOTAL TIME TAKING CARE OF THIS PATIENT:36 minutes.   More than 50% of the time was spent in counseling/coordination of care: YES  POSSIBLE D/C IN 1-2 DAYS, DEPENDING ON CLINICAL CONDITION.   Demetrios Loll M.D on 11/01/2015 at 4:52 PM  Between 7am to 6pm - Pager - [phone]  After 6pm go to [email or web address] - Proofreader  [facility]  Office  [phone]  CC: Primary care physician; Keith Rake, MD  Note: This dictation was prepared with Dragon dictation along with smaller phrase technology. Any transcriptional errors that result from this process are unintentional.

## 2015-11-01 NOTE — ED Notes (Signed)
Assisted the patient to the restroom by using the bedside commode.

## 2015-11-02 ENCOUNTER — Ambulatory Visit: Payer: Medicare PPO | Admitting: Family Medicine

## 2015-11-02 DIAGNOSIS — J440 Chronic obstructive pulmonary disease with acute lower respiratory infection: Secondary | ICD-10-CM | POA: Diagnosis not present

## 2015-11-02 LAB — ECHOCARDIOGRAM COMPLETE
Height: 64 in
Weight: 5297.6 oz

## 2015-11-02 MED ORDER — CHLORHEXIDINE GLUCONATE CLOTH 2 % EX PADS
6.0000 | MEDICATED_PAD | Freq: Every day | CUTANEOUS | Status: DC
  Administered 2015-11-03 – 2015-11-04 (×2): 6 via TOPICAL

## 2015-11-02 MED ORDER — BENZONATATE 100 MG PO CAPS
100.0000 mg | ORAL_CAPSULE | Freq: Three times a day (TID) | ORAL | Status: DC | PRN
  Administered 2015-11-02 – 2015-11-04 (×3): 100 mg via ORAL
  Filled 2015-11-02 (×3): qty 1

## 2015-11-02 MED ORDER — MUPIROCIN 2 % EX OINT
1.0000 "application " | TOPICAL_OINTMENT | Freq: Two times a day (BID) | CUTANEOUS | Status: DC
  Administered 2015-11-02 – 2015-11-04 (×5): 1 via NASAL
  Filled 2015-11-02: qty 22

## 2015-11-02 MED ORDER — ENOXAPARIN SODIUM 40 MG/0.4ML ~~LOC~~ SOLN
40.0000 mg | Freq: Two times a day (BID) | SUBCUTANEOUS | Status: DC
  Administered 2015-11-02 – 2015-11-04 (×5): 40 mg via SUBCUTANEOUS
  Filled 2015-11-02 (×5): qty 0.4

## 2015-11-02 MED ORDER — GUAIFENESIN ER 600 MG PO TB12
600.0000 mg | ORAL_TABLET | Freq: Two times a day (BID) | ORAL | Status: DC
  Administered 2015-11-02 – 2015-11-04 (×5): 600 mg via ORAL
  Filled 2015-11-02 (×5): qty 1

## 2015-11-02 NOTE — Progress Notes (Addendum)
[facility] [place] NAME: Loretta Wade    MR#:  [id number]  DATE OF BIRTH:  1950-07-07  SUBJECTIVE:  CHIEF COMPLAINT:   Chief Complaint  Patient presents with  . Chest Pain  . Shortness of Breath   Better cough, still shortness breath and wheezing. Food stuck in throat and choking. REVIEW OF SYSTEMS:  Review of Systems  Constitutional: Positive for malaise/fatigue. Negative for chills and fever.  HENT: Negative for congestion and sore throat.   Eyes: Negative for blurred vision and double vision.  Respiratory: Positive for cough, sputum production, shortness of breath and wheezing. Negative for stridor.   Cardiovascular: Positive for leg swelling. Negative for chest pain, palpitations and orthopnea.  Gastrointestinal: Negative for abdominal pain, blood in stool, constipation, diarrhea, melena, nausea and vomiting.  Genitourinary: Negative for dysuria and urgency.  Musculoskeletal: Negative for joint pain.  Skin: Negative for itching and rash.  Neurological: Positive for weakness. Negative for dizziness, tingling, focal weakness and headaches.  Psychiatric/Behavioral: Negative for depression.    DRUG ALLERGIES:  No Known Allergies VITALS:  Blood pressure 121/60, pulse 75, temperature 97.5 F (36.4 C), temperature source Oral, resp. rate (!) 21, height 5\' 4"  (1.626 m), weight (!) 331 lb 1.6 oz (150.2 kg), SpO2 96 %. PHYSICAL EXAMINATION:  Physical Exam  Constitutional: She is oriented to person, place, and time and well-developed, well-nourished, and in no distress. No distress.  HENT:  Head: Normocephalic and atraumatic.  Mouth/Throat: Oropharynx is clear and moist.  Eyes: Conjunctivae and EOM are normal. Pupils are equal, round, and reactive to light. No scleral icterus.  Neck: No JVD present. No tracheal deviation present. No thyromegaly present.  Cardiovascular: Normal rate, regular rhythm and normal heart sounds.  Exam  reveals no gallop.   No murmur heard. Pulmonary/Chest: Effort normal. No respiratory distress. She has wheezes. She has no rales.  Abdominal: Soft. Bowel sounds are normal. She exhibits no distension. There is no tenderness.  Musculoskeletal: Normal range of motion. She exhibits edema. She exhibits no tenderness.  Lymphadenopathy:    She has no cervical adenopathy.  Neurological: She is alert and oriented to person, place, and time. No cranial nerve deficit.  Skin: Skin is warm and dry.  Psychiatric: Mood, memory, affect and judgment normal.   LABORATORY PANEL:   CBC  Recent Labs Lab 11/01/15 0224  WBC 6.7  HGB 12.1  HCT 36.0  PLT 204   ------------------------------------------------------------------------------------------------------------------ Chemistries   Recent Labs Lab 11/01/15 0224  NA 137  K 4.0  CL 101  CO2 30  GLUCOSE 112*  BUN 15  CREATININE 0.77  CALCIUM 9.2  MG 2.1  AST 19  ALT 19  ALKPHOS 111  BILITOT 0.4   RADIOLOGY:  No results found. ASSESSMENT AND PLAN:   This is a 65 year old female admitted for tachypnea. 1. Tachypnea: Secondary to COPD exacerbation and acute bronchitis.   Continue home oxygen 2L [place]. Neb prn. Taper prednisone.Continue Zithromax and Rocephin.   * Chronic respiratory failure. Continue home oxygen.  2. CHF: Diastolic. Last EF 55-65% September 2016. Mild aortic regurg noted. unremarkble echo.  3. Morbid obesity: BMI 57.6; encourage diet and exercise  Dysphagia. Speech study suggests barium study in am.  All the records are reviewed and case discussed with Care Management/Social Worker. Management plans discussed with the patient, family and they are in agreement.  CODE STATUS:   TOTAL TIME TAKING CARE OF THIS PATIENT: 28 minutes.   More than  50% of the time was spent in counseling/coordination of care: YES  POSSIBLE D/C IN 1 DAYS, DEPENDING ON CLINICAL CONDITION.   Demetrios Loll M.D on 11/02/2015 at 1:35  PM  Between 7am to 6pm - Pager - [phone]  After 6pm go to [email or web address] - Proofreader  [facility]  Office  [phone]  CC: Primary care physician; Keith Rake, MD  Note: This dictation was prepared with Dragon dictation along with smaller phrase technology. Any transcriptional errors that result from this process are unintentional.

## 2015-11-02 NOTE — Evaluation (Signed)
Clinical/Bedside Swallow Evaluation Patient Details  Name: Loretta Wade MRN: [id number] Date of Birth: April 26, 1950  Today's Date: 11/02/2015 Time: SLP Start Time (ACUTE ONLY): 1400 SLP Stop Time (ACUTE ONLY): 1500 SLP Time Calculation (min) (ACUTE ONLY): 60 min  Past Medical History:  Past Medical History:  Diagnosis Date  . Anemia   . CHF (congestive heart failure) ([facility])   . COPD (chronic obstructive pulmonary disease) ([facility])    on 2L home o2  . Edema   . Esophageal reflux   . Fatigue   . Fibromyalgia    Following with pain management  . Hypertension   . Nocturia   . OA (osteoarthritis)   . Sleep apnea    not on CPAP   Past Surgical History:  Past Surgical History:  Procedure Laterality Date  . ABDOMINAL HYSTERECTOMY    . CHOLECYSTECTOMY    . TOTAL KNEE ARTHROPLASTY     right  . VESICOVAGINAL FISTULA CLOSURE W/ TAH     HPI:  Patient with past medical history of COPD and CHF, edema, fibromyalgia, OA, sleep apnea, and Esophageal Reflux (on OOI)  presented to emergency department complaining of shortness of breath. Her dyspnea has been worsening over the last 2 weeks. She uses oxygen intermittently at home but has had to use it more frequently. She states that she has had a cough productive of green sputum. She has seen her primary care doctor who started her on Levaquin but she reports no improvement. The patient admits to subjective fevers but denies nausea, vomiting or diarrhea. She noticed that she has a recurrence of edema in her lower extremities which reportedly has not been a problem for quite some time. Notably, the patient complains of chronic intermittent chest pain that seems to be worse lately. She describes the pain as a burning or inflammatory sensation in her upper chest. It does not radiate. Laboratory evaluation the emergency department was reassuring, however the patient continued to have tachypnea and chest discomfort which prompted the emergency department  staff to call the hospitalist service for further management. Pt c/o Reflux-like issues including significant coughing about halfway into her meals. She endorsed the fullness in her chest and the burning feelings were described as "hot needles" (mid chest area). Pt endorsed increased coughing for ~4 weeks. Pt is edentulous though continues to eat a diet heavy in bread and meats. Pt indicated she had an Upper GI study ~2 yrs ago w/ mention of a hiatal hernia by MD then.    Assessment / Plan / Recommendation Clinical Impression  Pt appears at increased risk for aspiration but unsure if related to a pharyngeal phase deficit vs an Esophageal phase deficit. Pt c/o Esophageal phase dysmotility and discomfort during/post meals - suspect this could be impacting swallowing at the pharyngeal phase level. Pt appears to adequately tolerate single sips and bites of thin liquid, soft foods following aspiration precautions and strategy of TIME b/t trials. Pt easily increases her WOB w/ exertion of any coughing and requires time to calm her breathing. Consulted MD re: pt's presentation and the recommendation for GI assessment. MD agreed and will order a Barium Study(GI). Recommended to pt that she chose her foods/meals carefully and follow aspiration precautions. NSG updated on results and recommenations.     Aspiration Risk  Mild aspiration risk (d/t Reflux and Esophageal issues)    Diet Recommendation  Regular diet w/ thin liquids; aspiration precautions; Reflux precautions  Medication Administration: Whole meds with puree    Other  Recommendations Recommended Consults: Consider GI evaluation;Consider esophageal assessment Oral Care Recommendations: Oral care BID;Staff/trained caregiver to provide oral care   Follow up Recommendations   (TBD)    Frequency and Duration min 2x/week  1 week       Prognosis Prognosis for Safe Diet Advancement: Fair      Swallow Study   General Date of Onset: 11/01/15 HPI:  Patient with past medical history of COPD and CHF, edema, fibromyalgia, OA, sleep apnea, and Esophageal Reflux (on OOI)  presented to emergency department complaining of shortness of breath. Her dyspnea has been worsening over the last 2 weeks. She uses oxygen intermittently at home but has had to use it more frequently. She states that she has had a cough productive of green sputum. She has seen her primary care doctor who started her on Levaquin but she reports no improvement. The patient admits to subjective fevers but denies nausea, vomiting or diarrhea. She noticed that she has a recurrence of edema in her lower extremities which reportedly has not been a problem for quite some time. Notably, the patient complains of chronic intermittent chest pain that seems to be worse lately. She describes the pain as a burning or inflammatory sensation in her upper chest. It does not radiate. Laboratory evaluation the emergency department was reassuring, however the patient continued to have tachypnea and chest discomfort which prompted the emergency department staff to call the hospitalist service for further management. Pt c/o Reflux-like issues including significant coughing about halfway into her meals. She endorsed the fullness in her chest and the burning feelings were described as "hot needles" (mid chest area). Pt endorsed increased coughing for ~4 weeks. Pt is edentulous though continues to eat a diet heavy in bread and meats. Pt indicated she had an Upper GI study ~2 yrs ago w/ mention of a hiatal hernia by MD then.  Type of Study: Bedside Swallow Evaluation Previous Swallow Assessment: none  Diet Prior to this Study: Regular;Thin liquids Temperature Spikes Noted: No (wbc 6.7) Respiratory Status: Nasal cannula (2-3 liters) History of Recent Intubation: No Behavior/Cognition: Alert;Cooperative;Pleasant mood Oral Cavity Assessment: Within Functional Limits Oral Care Completed by SLP: Recent completion by  staff Oral Cavity - Dentition: Edentulous Vision: Functional for self-feeding Self-Feeding Abilities: Able to feed self Patient Positioning: Upright in bed Baseline Vocal Quality: Normal Volitional Cough: Strong Volitional Swallow: Able to elicit    Oral/Motor/Sensory Function Overall Oral Motor/Sensory Function: Within functional limits   Ice Chips Ice chips: Not tested   Thin Liquid Thin Liquid: Impaired Presentation: Cup;Straw;Self Fed Oral Phase Impairments:  (none) Oral Phase Functional Implications:  (none) Pharyngeal  Phase Impairments: Cough - Delayed (x3 episodes) Other Comments: unsure if related to the po trial itself vs any potential Esophageal phase deficits    Nectar Thick Nectar Thick Liquid: Not tested   Honey Thick Honey Thick Liquid: Not tested   Puree Puree: Within functional limits Presentation: Self Fed;Spoon (3-4 trials)   Solid   GO   Solid: Within functional limits Presentation: Self Fed (2 trials)    Functional Assessment Tool Used: clinical judgement  Functional Limitations: Swallowing Swallow Current Status [id number]): At least 1 percent but less than 20 percent impaired, limited or restricted Swallow Goal Status [phone]): At least 1 percent but less than 20 percent impaired, limited or restricted Swallow Discharge Status [phone]): At least 1 percent but less than 20 percent impaired, limited or restricted     Orinda Kenner, MS, CCC-SLP  Watson,Katherine 11/02/2015,4:24 PM

## 2015-11-03 ENCOUNTER — Observation Stay: Payer: Medicare PPO

## 2015-11-03 ENCOUNTER — Telehealth: Payer: Self-pay | Admitting: Family Medicine

## 2015-11-03 DIAGNOSIS — M797 Fibromyalgia: Secondary | ICD-10-CM

## 2015-11-03 DIAGNOSIS — J440 Chronic obstructive pulmonary disease with acute lower respiratory infection: Secondary | ICD-10-CM | POA: Diagnosis not present

## 2015-11-03 MED ORDER — TRAMADOL HCL 50 MG PO TABS: 50.0000 mg | ORAL_TABLET | Freq: Two times a day (BID) | ORAL | Status: DC

## 2015-11-03 NOTE — Progress Notes (Signed)
Date: 11/03/2015,   MRN# [id number] Jacquelyne Cun Riepe 1950/11/15 Code Status:     Code Status Orders        Start     Ordered   11/01/15 0922  Full code  Continuous     11/01/15 0921    Code Status History    Date Active Date Inactive Code Status Order ID Comments User Context   11/01/2015  9:21 AM 11/01/2015  1:03 PM Full Code [id number]  Arnaldo Natal, MD Inpatient   07/11/2015  4:44 PM 07/14/2015  8:37 PM Full Code [id number]  Enid Baas, MD Inpatient   05/12/2015  6:04 PM 05/16/2015  7:58 PM Full Code [id number]  Shaune Pollack, MD Inpatient   04/12/2015  5:08 PM 04/15/2015  6:04 PM Full Code [id number]  Wyatt Haste, MD ED   01/28/2015  8:08 AM 02/08/2015  2:34 PM Full Code [id number]  Alford Highland, MD ED   11/13/2014  1:02 PM 11/19/2014  4:39 PM Full Code [id number]  Katharina Caper, MD Inpatient   11/04/2014  5:50 PM 11/08/2014  4:37 PM Full Code [id number]  Altamese Dilling, MD Inpatient     Hosp day:@LENGTHOFSTAYDAYS @ Referring MD: @ATDPROV @         AdmissionWeight: (!) 335 lb (152 kg)                 CurrentWeight: (!) 331 lb 1.6 oz (150.2 kg)   CC: Dyspnea/copd exacerbation  HPI: This is a 65 yo lady, very overweight, c/o cough, wheezing and shortness of breath. Being treated for copd exacerbation. This pm less coughing. No gerd, minimum post nasal drainage. Past hx of chf, cxr showed no edema. No pleurisy, calf pain, hemoptysis, ectopy or syncope. See previous notes.   PMHX:   Past Medical History:  Diagnosis Date  . Anemia   . CHF (congestive heart failure) ([facility])   . COPD (chronic obstructive pulmonary disease) ([facility])    on 2L home o2  . Edema   . Esophageal reflux   . Fatigue   . Fibromyalgia    Following with pain management  . Hypertension   . Nocturia   . OA (osteoarthritis)   . Sleep apnea    not on CPAP   Surgical Hx:  Past Surgical History:  Procedure Laterality Date  . ABDOMINAL HYSTERECTOMY    . CHOLECYSTECTOMY    . TOTAL KNEE ARTHROPLASTY     right   . VESICOVAGINAL FISTULA CLOSURE W/ TAH     Family Hx:  Family History  Problem Relation Age of Onset  . Hypertension Mother   . Diabetes    . Breast cancer Daughter    Social Hx:   Social History  Substance Use Topics  . Smoking status: Former Smoker    Quit date: 11/04/1983  . Smokeless tobacco: Never Used     Comment: quit several years ago - almost 30 years ago  . Alcohol use No   Medication:    Home Medication:    Current Medication: @CURMEDTAB @   Allergies:  Review of patient's allergies indicates no known allergies.  Review of Systems: Gen:  Denies  fever, sweats, chills HEENT: Denies blurred vision, double vision, ear pain, eye pain, hearing loss, nose bleeds, sore throat Cvc:  No dizziness, chest pain or heaviness Resp:   Cough, sob, less wheezing Gi: Denies swallowing difficulty, stomach pain, nausea or vomiting, diarrhea, constipation, bowel incontinence Gu:  Denies bladder incontinence, burning urine Ext:   No Joint pain, stiffness or swelling  Skin: No skin rash, easy bruising or bleeding or hives Endoc:  No polyuria, polydipsia , polyphagia or weight change Psych: No depression, insomnia or hallucinations  Other:  All other systems negative  Physical Examination:   VS: BP 125/62 (BP Location: Right Arm)   Pulse (!) 50   Temp 98 F (36.7 C) (Oral)   Resp 17   Ht 5\' 4"  (1.626 m)   Wt (!) 331 lb 1.6 oz (150.2 kg)   SpO2 96%   BMI 56.83 kg/m   General Appearance: No distress , very obese Neuro: without focal findings, mental status, speech normal, alert and oriented, cranial nerves 2-12 intact, reflexes normal and symmetric, sensation grossly normal  HEENT: PERRLA, EOM intact, no ptosis, no other lesions noticed, Mallampati: Pulmonary:.rare wheezing, No rales  No use of accessory muscles   Cardiovascular:  Normal S1,S2.  No m/r/g.  Abdominal aorta pulsation normal.    Abdomen:Benign, Soft, non-tender, No masses, hepatosplenomegaly, No  lymphadenopathy Endoc: No evident thyromegaly, no signs of acromegaly or Cushing features Skin:   warm, no rashes, no ecchymosis  Extremities: normal, no cyanosis, clubbing, mild edema, warm with normal capillary refill.    Labs results:   Recent Labs     11/01/15  0224  HGB  12.1  HCT  36.0  MCV  92.1  WBC  6.7  BUN  15  CREATININE  0.77  GLUCOSE  112*  CALCIUM  9.2  ,    Rad results:   Dg Esophagus  Result Date: 11/03/2015 CLINICAL DATA:  Shortness of breath and tachycardia.  COPD, obesity EXAM: ESOPHOGRAM/BARIUM SWALLOW TECHNIQUE: Single contrast examination was performed using thin barium or water soluble. A barium tablet was also administered. FLUOROSCOPY TIME:  Fluoroscopy Time:  1 minutes, 12 seconds Number of Acquired Images:  2 video loops and 1 spot film. COMPARISON:  None in PACs FINDINGS: The study is quite limited due to the patient's clinical condition and very limited mobility. The patient ingested the thin barium without difficulty. No laryngeal penetration was observed. A sub cm pulsion diverticulum was observed in the cervical esophagus which emptied spontaneously. No stricture was observed. IMPRESSION: Very limited study demonstrating no evidence of laryngeal penetration of the barium. Esophageal motility appeared normal. No fixed stricture was observed. Electronically Signed   By: David  [place] M.D.   On: 11/03/2015 09:03   CLINICAL DATA: Shortness of breath and chest pain for 1 day.  EXAM: PORTABLE CHEST 1 VIEW  COMPARISON:  Chest radiograph May 12, 2015  FINDINGS: Cardiac silhouette appears mildly enlarged and unchanged. Mild bronchitic change without pleural effusion or focal consolidation. No pneumothorax. Large body habitus. Osseous structures are nonsuspicious.  IMPRESSION: Mild cardiomegaly and similar bronchitic changes.   Electronically Signed   By: Awilda Metro M.D.   On: 11/01/2015 03:11     Assessment and Plan: Dyspnea,  due primarily to her weight, may also have reactive airway disease/stage 0 copd, ex smoker. Here with on going bronchitis, a cough that has been plaquing her x weeks Obstructive sleep apnea, not on cpap ( use to, broken), repeat sleep study pending   Plan: Must loose weight Prn albuterol, anoro singulair, steroids as you doing Sleep study later this month Continue o2 with activity  Home health Tramadol to help suppress the cough Will see in am       I have personally obtained a history, examined the patient, evaluated laboratory and imaging results, formulated the assessment and plan and placed orders.  The  Patient requires high complexity decision making for assessment and support, frequent evaluation and titration of therapies, application of advanced monitoring technologies and extensive interpretation of multiple databases.   Sarinah Doetsch,M.D. Pulmonary & Critical care Medicine [facility]

## 2015-11-03 NOTE — Telephone Encounter (Signed)
Spoke with patient and she has been informed that prior authorization has been sent on yesterday 11/02/2015 and is pending she will be notified once authorization has been approved

## 2015-11-03 NOTE — Telephone Encounter (Signed)
Patient is currently admitted to the hospital, she should follow up after discharge for medication management

## 2015-11-03 NOTE — Progress Notes (Addendum)
Speech Language Pathology Treatment: Dysphagia (education as well)  Patient Details Name: Loretta Wade MRN: [id number] DOB: 05/10/1950 Today's Date: 11/03/2015 Time: 1230-1300 SLP Time Calculation (min) (ACUTE ONLY): 30 min  Assessment / Plan / Recommendation Clinical Impression  Pt had a Barium Study(GI) performed this AM. No aspiration of liquid barium was noted during that exam. Radiologist noted a Pulsion Diverticulum in the report. Consulted w/ MD re: the study results and if education could be given to pt on the finding of the Diverticulum as well as Reflux education; MD agreed.  SLP met w/ pt who was finishing her meal. Pt consumed po's of thin liquids and solids w/ no immediate, overt s/s of aspiration noted. NSG stated same saying that they have not been called to the room re: any deficits or noted deficits w/ swallowing pills w/ water thus far. Handouts and education given on a pulsion Diverticulum, Reflux precautions, the impact of Reflux on swallowing in general(especially the pharyngeal phase), the impact of food regurgitated from the Diverticulum and increased risk for aspiration of such, food/diet consistency options, and need to moisten foods well w/ alternating foods/liquids for Esophageal clearing. Discussed these issues thoroughly w/ pt and instructed her to f/u w/ GI re: her Esophageal phase deficits; pt continues to c/o a "burning feeling" in her chest during meals. Pt is at increased risk for aspiration of REFLUX material(no aspiration was noted during the oropharyngeal phase of the Barium study). Pt appears at her baseline. She stated she will f/u w/ GI after discharge. MD and NSG updated and agreed w/ recommendations.    HPI HPI: Patient with past medical history of COPD and CHF, edema, fibromyalgia, OA, sleep apnea, and Esophageal Reflux (on OOI)  presented to emergency department complaining of shortness of breath. Her dyspnea has been worsening over the last 2 weeks. She  uses oxygen intermittently at home but has had to use it more frequently. She states that she has had a cough productive of green sputum. She has seen her primary care doctor who started her on Levaquin but she reports no improvement. The patient admits to subjective fevers but denies nausea, vomiting or diarrhea. She noticed that she has a recurrence of edema in her lower extremities which reportedly has not been a problem for quite some time. Notably, the patient complains of chronic intermittent chest pain that seems to be worse lately. She describes the pain as a burning or inflammatory sensation in her upper chest. It does not radiate. Laboratory evaluation the emergency department was reassuring, however the patient continued to have tachypnea and chest discomfort which prompted the emergency department staff to call the hospitalist service for further management. Pt c/o Reflux-like issues including significant coughing about halfway into her meals. She endorsed the fullness in her chest and the burning feelings were described as "hot needles" (mid chest area). Pt endorsed increased coughing for ~4 weeks. Pt is edentulous though continues to eat a diet heavy in bread and meats. Pt indicated she had an Upper GI study ~2 yrs ago w/ mention of a hiatal hernia by MD then.       SLP Plan  All goals met     Recommendations  Diet recommendations: Dysphagia 3 (mechanical soft);Thin liquid (foods moistened well; cut small) Liquids provided via: Cup;Straw Medication Administration: Whole meds with liquid Supervision: Patient able to self feed (tray seutp and positioning upright) Compensations: Minimize environmental distractions;Slow rate;Small sips/bites;Follow solids with liquid Postural Changes and/or Swallow Maneuvers: Seated upright 90 degrees;Upright  30-60 min after meal             General recommendations:  (Dietician consult) Oral Care Recommendations: Oral care BID;Staff/trained caregiver to  provide oral care Follow up Recommendations: None Plan: All goals met     GO                 Jerilynn Som, MS, CCC-SLP  Dalyla Chui 11/03/2015, 2:49 PM

## 2015-11-03 NOTE — Telephone Encounter (Signed)
Routed to Dr. Shah for approval 

## 2015-11-03 NOTE — Progress Notes (Signed)
PT Cancellation Note  Patient Details Name: IZZABELLE VELTRI MRN: [id number] DOB: Oct 23, 1950   Cancelled Treatment:    Reason Eval/Treat Not Completed: Patient at procedure or test/unavailable.  Pt currently off floor for testing per nursing.  Will re-attempt PT eval at a later date/time.   Raquel Sarna Xzavior Reinig 11/03/2015, 8:30 AM Leitha Bleak, [place]

## 2015-11-03 NOTE — Progress Notes (Signed)
[facility] [place] NAME: Loretta Wade    MR#:  [id number]  DATE OF BIRTH:  05-15-1950  SUBJECTIVE:  CHIEF COMPLAINT:   Chief Complaint  Patient presents with  . Chest Pain  . Shortness of Breath   Still cough, shortness breath and wheezing. REVIEW OF SYSTEMS:  Review of Systems  Constitutional: Positive for malaise/fatigue. Negative for chills and fever.  HENT: Negative for congestion and sore throat.   Eyes: Negative for blurred vision and double vision.  Respiratory: Positive for cough, sputum production, shortness of breath and wheezing. Negative for stridor.   Cardiovascular: Positive for leg swelling. Negative for chest pain, palpitations and orthopnea.  Gastrointestinal: Negative for abdominal pain, blood in stool, constipation, diarrhea, melena, nausea and vomiting.  Genitourinary: Negative for dysuria and urgency.  Musculoskeletal: Negative for joint pain.  Skin: Negative for itching and rash.  Neurological: Positive for weakness. Negative for dizziness, tingling, focal weakness and headaches.  Psychiatric/Behavioral: Negative for depression.    DRUG ALLERGIES:  No Known Allergies VITALS:  Blood pressure 122/60, pulse (!) 58, temperature 98.2 F (36.8 C), temperature source Oral, resp. rate 16, height 5\' 4"  (1.626 m), weight (!) 331 lb 1.6 oz (150.2 kg), SpO2 95 %. PHYSICAL EXAMINATION:  Physical Exam  Constitutional: She is oriented to person, place, and time and well-developed, well-nourished, and in no distress. No distress.  HENT:  Head: Normocephalic and atraumatic.  Mouth/Throat: Oropharynx is clear and moist.  Eyes: Conjunctivae and EOM are normal. Pupils are equal, round, and reactive to light. No scleral icterus.  Neck: No JVD present. No tracheal deviation present. No thyromegaly present.  Cardiovascular: Normal rate, regular rhythm and normal heart sounds.  Exam reveals no gallop.   No murmur  heard. Pulmonary/Chest: Effort normal. No respiratory distress. She has wheezes. She has no rales.  Abdominal: Soft. Bowel sounds are normal. She exhibits no distension. There is no tenderness.  Musculoskeletal: Normal range of motion. She exhibits edema. She exhibits no tenderness.  Lymphadenopathy:    She has no cervical adenopathy.  Neurological: She is alert and oriented to person, place, and time. No cranial nerve deficit.  Skin: Skin is warm and dry.  Psychiatric: Mood, memory, affect and judgment normal.   LABORATORY PANEL:   CBC  Recent Labs Lab 11/01/15 0224  WBC 6.7  HGB 12.1  HCT 36.0  PLT 204   ------------------------------------------------------------------------------------------------------------------ Chemistries   Recent Labs Lab 11/01/15 0224  NA 137  K 4.0  CL 101  CO2 30  GLUCOSE 112*  BUN 15  CREATININE 0.77  CALCIUM 9.2  MG 2.1  AST 19  ALT 19  ALKPHOS 111  BILITOT 0.4   RADIOLOGY:  Dg Esophagus  Result Date: 11/03/2015 CLINICAL DATA:  Shortness of breath and tachycardia.  COPD, obesity EXAM: ESOPHOGRAM/BARIUM SWALLOW TECHNIQUE: Single contrast examination was performed using thin barium or water soluble. A barium tablet was also administered. FLUOROSCOPY TIME:  Fluoroscopy Time:  1 minutes, 12 seconds Number of Acquired Images:  2 video loops and 1 spot film. COMPARISON:  None in PACs FINDINGS: The study is quite limited due to the patient's clinical condition and very limited mobility. The patient ingested the thin barium without difficulty. No laryngeal penetration was observed. A sub cm pulsion diverticulum was observed in the cervical esophagus which emptied spontaneously. No stricture was observed. IMPRESSION: Very limited study demonstrating no evidence of laryngeal penetration of the barium. Esophageal motility appeared normal. No fixed stricture  was observed. Electronically Signed   By: David  Martinique M.D.   On: 11/03/2015 09:03    ASSESSMENT AND PLAN:   This is a 65 year old female admitted for tachypnea. 1. Tachypnea improved: Secondary to COPD exacerbation and acute bronchitis.   Continue home oxygen 2L . Neb prn. Taper prednisone.Continue Zithromax and is continued Rocephin.   * Chronic respiratory failure. Continue home oxygen.  2. CHF: Diastolic. Last EF 55-65% September 2016. Mild aortic regurg noted. unremarkble echo.  3. Morbid obesity: BMI 57.6; encourage diet and exercise  Dysphagia. Speech study suggests barium study: Esophageal motility appeared normal. No fixed stricture was observed.  The patient still complains of cough and wheezing, she doesn't feel comfortable to be discharged to home.  All the records are reviewed and case discussed with Care Management/Social Worker. Management plans discussed with the patient, family and they are in agreement.  CODE STATUS: Full code.  TOTAL TIME TAKING CARE OF THIS PATIENT: 35 minutes.   More than 50% of the time was spent in counseling/coordination of care: YES  POSSIBLE D/C IN 1 DAYS, DEPENDING ON CLINICAL CONDITION.   Demetrios Loll M.D on 11/03/2015 at 2:07 PM  Between 7am to 6pm - Pager - [phone]  After 6pm go to [email or web address] - Proofreader  [facility]  Office  [phone]  CC: Primary care physician; Keith Rake, MD  Note: This dictation was prepared with Dragon dictation along with smaller phrase technology. Any transcriptional errors that result from this process are unintentional.

## 2015-11-03 NOTE — Evaluation (Signed)
Physical Therapy Evaluation Patient Details Name: Loretta Wade MRN: [id number] DOB: 09/15/1950 Today's Date: 11/03/2015   History of Present Illness  Pt is a 65 y.o. female presenting to hospital with chest pain and tachypnea.  Pt admitted to hospital with tachypnea secondary to COPD exacerbation and acute bronchitis; diastolic CHF.  PMH includes Fibromyalgia, CHF, COPD (on 2 L home O2), R TKA.  Clinical Impression  Prior to admission, pt was ambulating short distances in home modified independent with bariatric RW (on chronic 2L home O2).  Pt lives with her son and daughter-in-law in 1 level home with level entry.  Per CM, pt has private sitters.  Currently pt is modified independent supine to sit, CGA with transfers with walker, and CGA with ambulation 25 feet in room with bariatric RW.  Pt would benefit from skilled PT to address noted impairments and functional limitations and optimize functional mobility during hospital stay for safe discharge home.  Pt reports being close to baseline in terms of functional mobility assist needs (with some increased SOB).  Recommend pt discharge to home with prior level of support and use of bariatric RW when medically appropriate.     Follow Up Recommendations No PT follow up;Supervision - Intermittent    Equipment Recommendations   (Pt already owns Bariatric RW)    Recommendations for Other Services       Precautions / Restrictions Precautions Precautions: Fall Restrictions Weight Bearing Restrictions: No      Mobility  Bed Mobility Overal bed mobility: Modified Independent Bed Mobility: Supine to Sit     Supine to sit: Modified independent (Device/Increase time);HOB elevated     General bed mobility comments: Some SOB noted but no assist required to sit up  Transfers Overall transfer level: Needs assistance Equipment used:  Judie Petit RW) Transfers: Sit to/from [employer] to Stand: Min guard Stand pivot  transfers: Min guard (bed to chair)       General transfer comment: strong stand; steady without loss of balance  Ambulation/Gait Ambulation/Gait assistance: Min guard Ambulation Distance (Feet): 25 Feet Assistive device:  Judie Petit RW) Gait Pattern/deviations: Wide base of support;Decreased step length - left;Decreased step length - right Gait velocity: decreased   General Gait Details: SOB noted with distance but pt steady without loss of balance  Stairs            Wheelchair Mobility    Modified Rankin (Stroke Patients Only)       Balance Overall balance assessment: Needs assistance Sitting-balance support: No upper extremity supported;Feet supported Sitting balance-[name] Scale: Normal     Standing balance support: Bilateral upper extremity supported;During functional activity (on RW) Standing balance-[name] Scale: Good                               Pertinent Vitals/Pain Pain Assessment: 0-10 Pain Score: 8  Pain Location: chronic pain "all over" Pain Descriptors / Indicators: Sore;Tender Pain Intervention(s): Limited activity within patient's tolerance;Monitored during session;Repositioned;Relaxation (Pt declined pain meds)  Vitals stable and WFL throughout treatment session on 2 L/min O2 via nasal cannula.    Home Living Family/patient expects to be discharged to:: Private residence Living Arrangements: Children (Pt's son and daughter-in-law) Available Help at Discharge: Family Type of Home: House Home Access: Level entry     Home Layout: One level Home Equipment: Environmental consultant - 2 wheels;Hospital bed;Wheelchair - [employer];Toilet riser      Prior Function Level of Independence: Needs  assistance         Comments: Has assist with bathing/dressing.  CM reports pt has private sitters.  Pt reports 1 fall late April/May sliding out of recliner.  Ambulates with bariatric RW; occasional use of w/c (for community)     Hand Dominance         Extremity/Trunk Assessment   Upper Extremity Assessment: Generalized weakness           Lower Extremity Assessment: Generalized weakness         Communication   Communication: No difficulties  Cognition Arousal/Alertness: Awake/alert Behavior During Therapy: WFL for tasks assessed/performed Overall Cognitive Status: Within Functional Limits for tasks assessed                      General Comments General comments (skin integrity, edema, etc.): Pt laying in bed upon PT arrival.  Nursing cleared pt for participation in physical therapy.  Pt agreeable to PT session.    Exercises General Exercises - Lower Extremity Ankle Circles/Pumps: AROM;Strengthening;Both;10 reps;Supine Quad Sets: AROM;Strengthening;Both;10 reps;Supine Short Arc Quad: AROM;Strengthening;Both;10 reps;Supine Heel Slides: AROM;Strengthening;Both;10 reps;Supine Hip ABduction/ADduction: AROM;Strengthening;Both;10 reps;Supine  Pt requiring some rest breaks d/t SOB.      Assessment/Plan    PT Assessment Patient needs continued PT services  PT Diagnosis Generalized weakness   PT Problem List Decreased activity tolerance;Decreased mobility  PT Treatment Interventions DME instruction;Gait training;Functional mobility training;Therapeutic activities;Therapeutic exercise;Balance training;Patient/family education   PT Goals (Current goals can be found in the Care Plan section) Acute Rehab PT Goals Patient Stated Goal: to go home PT Goal Formulation: With patient Time For Goal Achievement: 11/17/15 Potential to Achieve Goals: Good    Frequency Min 2X/week   Barriers to discharge        Co-evaluation               End of Session Equipment Utilized During Treatment: Gait belt;Oxygen (2 L/min O2 via nasal cannula) Activity Tolerance:  (Limited d/t SOB) Patient left: in chair;with call bell/phone within reach;with chair alarm set Nurse Communication: Mobility status;Precautions (Nursing  tech notified pt with urinary incontinence in underwear during mobility and requiring clean-up.)    Functional Assessment Tool Used: AM-PAC without stairs Functional Limitation: Mobility: Walking and moving around Mobility: Walking and Moving Around Current Status [id number]): At least 20 percent but less than 40 percent impaired, limited or restricted Mobility: Walking and Moving Around Goal Status [phone]): At least 1 percent but less than 20 percent impaired, limited or restricted    Time: 1350-1421 PT Time Calculation (min) (ACUTE ONLY): 31 min   Charges:   PT Evaluation $PT Eval Low Complexity: 1 Procedure PT Treatments $Therapeutic Exercise: 8-22 mins   PT G Codes:   PT G-Codes **NOT FOR INPATIENT CLASS** Functional Assessment Tool Used: AM-PAC without stairs Functional Limitation: Mobility: Walking and moving around Mobility: Walking and Moving Around Current Status [id number]): At least 20 percent but less than 40 percent impaired, limited or restricted Mobility: Walking and Moving Around Goal Status [phone]): At least 1 percent but less than 20 percent impaired, limited or restricted    [facility] 11/03/2015, 2:46 PM Leitha Bleak, [place]

## 2015-11-04 ENCOUNTER — Telehealth: Payer: Self-pay | Admitting: Family Medicine

## 2015-11-04 ENCOUNTER — Observation Stay: Payer: Medicare PPO

## 2015-11-04 ENCOUNTER — Ambulatory Visit: Payer: Medicare PPO

## 2015-11-04 DIAGNOSIS — J440 Chronic obstructive pulmonary disease with acute lower respiratory infection: Secondary | ICD-10-CM | POA: Diagnosis not present

## 2015-11-04 LAB — CREATININE, SERUM
Creatinine, Ser: 0.66 mg/dL (ref 0.44–1.00)
GFR calc Af Amer: >60 mL/min (ref >60)
GFR calc non Af Amer: >60 mL/min (ref >60)

## 2015-11-04 MED ORDER — PREDNISONE 10 MG PO TABS: ORAL_TABLET | ORAL | 0 refills | Status: DC

## 2015-11-04 MED ORDER — HYDROCODONE-ACETAMINOPHEN 5-325 MG PO TABS: 1.0000 | ORAL_TABLET | Freq: Four times a day (QID) | ORAL | 0 refills | Status: DC | PRN

## 2015-11-04 MED ORDER — HYDROCOD POLST-CPM POLST ER 10-8 MG/5ML PO SUER
5.0000 mL | Freq: Two times a day (BID) | ORAL | Status: DC
  Administered 2015-11-04: 5 mL via ORAL
  Filled 2015-11-04: qty 5

## 2015-11-04 MED ORDER — GABAPENTIN 100 MG PO CAPS
100.0000 mg | ORAL_CAPSULE | Freq: Two times a day (BID) | ORAL | Status: DC
  Administered 2015-11-04: 100 mg via ORAL
  Filled 2015-11-04: qty 1

## 2015-11-04 MED ORDER — HYDROCOD POLST-CPM POLST ER 10-8 MG/5ML PO SUER: 5.0000 mL | Freq: Two times a day (BID) | ORAL | 0 refills | Status: DC | PRN

## 2015-11-04 NOTE — Telephone Encounter (Signed)
Patient has been notified that we are awaiting [name] approval of pain medication, she will be notified once prescription is ready for pickup

## 2015-11-04 NOTE — Discharge Instructions (Signed)
Heart healthy and ADA diet. Activity as tolerated. Fall and aspiration precaution. Continue home O2 [place] 2L.

## 2015-11-04 NOTE — Telephone Encounter (Signed)
Rx changed to hydrocodone-acetaminophen 5-325 mg 1 tablet every 6 hours as needed

## 2015-11-04 NOTE — Progress Notes (Signed)
Discharge paperwork reviewed with patient, prescriptions attached. Patient verbalized understanding. Patient's son to transport patient home.

## 2015-11-04 NOTE — Progress Notes (Signed)
Dr. Bridgett Larsson notified patient is requesting pain medication refill. Dr. Bridgett Larsson stated he explained to the patient she must follow-up with PCP for pain medication refill. Patient updated.

## 2015-11-04 NOTE — Plan of Care (Signed)
Problem: SLP Dysphagia Goals Goal: Misc Dysphagia Goal Pt will safely tolerate po diet of least restrictive consistency w/ no overt s/s of aspiration noted by Staff/pt/family x3 sessions.    

## 2015-11-04 NOTE — Discharge Summary (Signed)
[facility] [place] NAME: Loretta Wade    MR#:  [id number]  DATE OF BIRTH:  1950/07/06  DATE OF ADMISSION:  11/01/2015   ADMITTING PHYSICIAN: Harrie Foreman, MD  DATE OF DISCHARGE: 11/04/2015  PRIMARY CARE PHYSICIAN: Keith Rake, MD   ADMISSION DIAGNOSIS:  Shortness of breath [R06.02] COPD exacerbation (HCC) [J44.1] DISCHARGE DIAGNOSIS:  Active Problems:   Tachypnea COPD exacerbation and acute bronchitis SECONDARY DIAGNOSIS:   Past Medical History:  Diagnosis Date  . Anemia   . CHF (congestive heart failure) ([place])   . COPD (chronic obstructive pulmonary disease) (HCC)    on 2L home o2  . Edema   . Esophageal reflux   . Fatigue   . Fibromyalgia    Following with pain management  . Hypertension   . Nocturia   . OA (osteoarthritis)   . Sleep apnea    not on CPAP   HOSPITAL COURSE:  This is a 65 year old female admitted for tachypnea.  1. Tachypnea improved: Secondary to COPD exacerbation and acute bronchitis.   Continue home oxygen 2L [place]. Neb prn. Tapered prednisone.treated Zithromax and discontinued Rocephin. Cough, robitussin prn. Repeat chest x-ray today didn't show any pneumonia. Per Dr. Raul Del, patient can be discharged home and follow-up as outpatient.   * Chronic respiratory failure. Continue home oxygen.  2. CHF: Diastolic. Last EF 55-65% September 2016. Mild aortic regurg noted. unremarkble echo.  3. Morbid obesity: BMI 57.6; encourage diet and exercise  Dysphagia. Speech study suggests barium study: Esophageal motility appeared normal. No fixed stricture was observed.  Discussed with Dr. Raul Del. DISCHARGE CONDITIONS:  Stable, discharge to home today. CONSULTS OBTAINED:  Treatment Team:  Erby Pian, MD DRUG ALLERGIES:  No Known Allergies DISCHARGE MEDICATIONS:     Medication List    STOP taking these medications   moxifloxacin 400 MG tablet Commonly known as:  AVELOX     TAKE  these medications   acetaminophen 325 MG tablet Commonly known as:  TYLENOL Take 2 tablets (650 mg total) by mouth every 6 (six) hours as needed for mild pain (or Fever >/= 101).   albuterol (2.5 MG/3ML) 0.083% nebulizer solution Commonly known as:  PROVENTIL USE ONE TREATMENT EVERY 6 HOURS AS NEEDED FOR WHEEZING OR SHORTNESS OF BREATH   ANORO ELLIPTA 62.5-25 MCG/INH Aepb Generic drug:  umeclidinium-vilanterol Inhale 1 puff into the lungs daily.   chlorpheniramine-HYDROcodone 10-8 MG/5ML Suer Commonly known as:  TUSSIONEX Take 5 mLs by mouth every 12 (twelve) hours as needed for cough.   cholecalciferol 1000 units tablet Commonly known as:  VITAMIN D Take 1,000 Units by mouth daily.   citalopram 20 MG tablet Commonly known as:  CELEXA Take 20 mg by mouth daily.   colesevelam 625 MG tablet Commonly known as:  WELCHOL Take 1 tablet (625 mg total) by mouth 2 (two) times daily with a meal.   CORICIDIN D COLD/FLU/SINUS 2-5-325 MG Tabs Generic drug:  Chlorphen-Phenyleph-APAP Take 2 tablets by mouth daily as needed (for nasal congestion/cough).   dexlansoprazole 60 MG capsule Commonly known as:  DEXILANT Take 1 capsule (60 mg total) by mouth daily.   fluticasone 50 MCG/ACT nasal spray Commonly known as:  FLONASE Place 2 sprays into both nostrils daily.   furosemide 40 MG tablet Commonly known as:  LASIX Take 1 tablet (40 mg total) by mouth 2 (two) times daily.   guaiFENesin-dextromethorphan 100-10 MG/5ML syrup Commonly known as:  ROBITUSSIN DM Take 5-10 mLs by  mouth every 4 (four) hours as needed for cough.   Hydrocodone-Acetaminophen 7.5-300 MG Tabs Take 1 tablet by mouth every 6 (six) hours as needed. What changed:  reasons to take this   loperamide 2 MG capsule Commonly known as:  IMODIUM Take 2-4 mg by mouth as needed for diarrhea or loose stools.   Magnesium 250 MG Tabs Take 250 mg by mouth daily.   oxybutynin 5 MG tablet Commonly known as:  DITROPAN Take 1  tablet (5 mg total) by mouth daily.   pantoprazole 40 MG tablet Commonly known as:  PROTONIX TAKE ONE (1) TABLET BY MOUTH TWO (2) TIMES DAILY   predniSONE 10 MG tablet Commonly known as:  DELTASONE 20 mg po daily for 2 days, 10 mg po daily for 2 days.   pregabalin 150 MG capsule Commonly known as:  LYRICA Take 1 capsule (150 mg total) by mouth 2 (two) times daily.   propranolol ER 80 MG 24 hr capsule Commonly known as:  INDERAL LA TAKE ONE CAPSULE BY MOUTH DAILY   ranitidine 150 MG tablet Commonly known as:  ZANTAC Take 1 tablet (150 mg total) by mouth 2 (two) times daily.   rOPINIRole 0.25 MG tablet Commonly known as:  REQUIP Take 3 tablets (0.75 mg total) by mouth at bedtime.        DISCHARGE INSTRUCTIONS:   DIET:  Heart healthy and ADA diet DISCHARGE CONDITION:  Stable ACTIVITY:  As tolerated OXYGEN:  Home Oxygen: O2 [place]. DISCHARGE LOCATION:    If you experience worsening of your admission symptoms, develop shortness of breath, life threatening emergency, suicidal or homicidal thoughts you must seek medical attention immediately by calling 911 or calling your MD immediately  if symptoms less severe.  You Must read complete instructions/literature along with all the possible adverse reactions/side effects for all the Medicines you take and that have been prescribed to you. Take any new Medicines after you have completely understood and accpet all the possible adverse reactions/side effects.   Please note  You were cared for by a hospitalist during your hospital stay. If you have any questions about your discharge medications or the care you received while you were in the hospital after you are discharged, you can call the unit and asked to speak with the hospitalist on call if the hospitalist that took care of you is not available. Once you are discharged, your primary care physician will handle any further medical issues. Please note that NO REFILLS for any  discharge medications will be authorized once you are discharged, as it is imperative that you return to your primary care physician (or establish a relationship with a primary care physician if you do not have one) for your aftercare needs so that they can reassess your need for medications and monitor your lab values.    On the day of Discharge:  VITAL SIGNS:  Blood pressure 118/63, pulse (!) 59, temperature 98.1 F (36.7 C), temperature source Oral, resp. rate 18, height 5\' 4"  (1.626 m), weight (!) 332 lb 6.4 oz (150.8 kg), SpO2 96 %. PHYSICAL EXAMINATION:  GENERAL:  65 y.o.-year-old patient lying in the bed with no acute distress.Morbidly obese.  EYES: Pupils equal, round, reactive to light and accommodation. No scleral icterus. Extraocular muscles intact.  HEENT: Head atraumatic, normocephalic. Oropharynx and nasopharynx clear.  NECK:  Supple, no jugular venous distention. No thyroid enlargement, no tenderness.  LUNGS: Normal breath sounds bilaterally, mild wheezing while patient coughing. No use of accessory muscles of  respiration.  CARDIOVASCULAR: S1, S2 normal. No murmurs, rubs, or gallops.  ABDOMEN: Soft, non-tender, non-distended. Bowel sounds present. No organomegaly or mass.  EXTREMITIES: No pedal edema, cyanosis, or clubbing.  NEUROLOGIC: Cranial nerves II through XII are intact. Muscle strength 5/5 in all extremities. Sensation intact. Gait not checked.  PSYCHIATRIC: The patient is alert and oriented x 3.  SKIN: No obvious rash, lesion, or ulcer.  DATA REVIEW:   CBC  Recent Labs Lab 11/01/15 0224  WBC 6.7  HGB 12.1  HCT 36.0  PLT 204    Chemistries   Recent Labs Lab 11/01/15 0224 11/04/15 0429  NA 137  --   K 4.0  --   CL 101  --   CO2 30  --   GLUCOSE 112*  --   BUN 15  --   CREATININE 0.77 0.66  CALCIUM 9.2  --   MG 2.1  --   AST 19  --   ALT 19  --   ALKPHOS 111  --   BILITOT 0.4  --      Microbiology Results  Results for orders placed or  performed during the hospital encounter of 11/01/15  Blood culture (routine x 2)     Status: None (Preliminary result)   Collection Time: 11/01/15  2:24 AM  Result Value Ref Range Status   Specimen Description BLOOD LEFT ASSIST CONTROL  Final   Special Requests   Final    BOTTLES DRAWN AEROBIC AND ANAEROBIC 5CCAERO,10CCANA   Culture NO GROWTH 3 DAYS  Final   Report Status PENDING  Incomplete  Blood culture (routine x 2)     Status: None (Preliminary result)   Collection Time: 11/01/15  6:20 AM  Result Value Ref Range Status   Specimen Description BLOOD RIGHT HAND  Final   Special Requests   Final    BOTTLES DRAWN AEROBIC AND ANAEROBIC AER 6ML ANA 4ML   Culture NO GROWTH 3 DAYS  Final   Report Status PENDING  Incomplete  MRSA PCR Screening     Status: Abnormal   Collection Time: 11/01/15 10:03 AM  Result Value Ref Range Status   MRSA by PCR POSITIVE (A) NEGATIVE Final    Comment:        The GeneXpert MRSA Assay (FDA approved for NASAL specimens only), is one component of a comprehensive MRSA colonization surveillance program. It is not intended to diagnose MRSA infection nor to guide or monitor treatment for MRSA infections. RESULT CALLED TO, READ BACK BY AND VERIFIED WITH: MEGAN OAKLEY 11/01/15 1214 SGD     RADIOLOGY:  Dg Chest 2 View  Result Date: 11/04/2015 CLINICAL DATA:  65 year old former smoker with chronic cough over the past several weeks. Current history of CHF, COPD and hypertension. EXAM: CHEST  2 VIEW COMPARISON:  11/01/2015, 05/12/2015 and earlier FINDINGS: Lateral image suboptimal due to body habitus as on prior examinations. Markedly suboptimal inspiration which accounts for mild atelectasis in the lung bases. Lungs otherwise clear. Cardiac silhouette upper normal in size to slightly enlarged for technique and degree of inspiration. IMPRESSION: Suboptimal inspiration accounts for mild bibasilar atelectasis. No acute cardiopulmonary disease otherwise.  Electronically Signed   By: Evangeline Dakin M.D.   On: 11/04/2015 11:05     Management plans discussed with the patient, family and they are in agreement.  CODE STATUS:     Code Status Orders        Start     Ordered   11/01/15 [phone]  Full code  Continuous     11/01/15 0921    Code Status History    Date Active Date Inactive Code Status Order ID Comments User Context   11/01/2015  9:21 AM 11/01/2015  1:03 PM Full Code [id number]  Harrie Foreman, MD Inpatient   07/11/2015  4:44 PM 07/14/2015  8:37 PM Full Code [id number]  Gladstone Lighter, MD Inpatient   05/12/2015  6:04 PM 05/16/2015  7:58 PM Full Code [id number]  Demetrios Loll, MD Inpatient   04/12/2015  5:08 PM 04/15/2015  6:04 PM Full Code [id number]  Lytle Butte, MD ED   01/28/2015  8:08 AM 02/08/2015  2:34 PM Full Code [id number]  Loletha Grayer, MD ED   11/13/2014  1:02 PM 11/19/2014  4:39 PM Full Code [id number]  Theodoro Grist, MD Inpatient   11/04/2014  5:50 PM 11/08/2014  4:37 PM Full Code [id number]  Vaughan Basta, MD Inpatient      TOTAL TIME TAKING CARE OF THIS PATIENT: 42 minutes.    Demetrios Loll M.D on 11/04/2015 at 2:18 PM  Between 7am to 6pm - Pager - [phone]  After 6pm go to [email or web address] - Proofreader  [facility]  Hospitalists  Office  [phone]  CC: Primary care physician; Keith Rake, MD   Note: This dictation was prepared with Dragon dictation along with smaller phrase technology. Any transcriptional errors that result from this process are unintentional.

## 2015-11-06 LAB — CULTURE, BLOOD (ROUTINE X 2)
CULTURE: NO GROWTH
Culture: NO GROWTH

## 2015-11-09 ENCOUNTER — Telehealth: Payer: Self-pay | Admitting: Family Medicine

## 2015-11-09 DIAGNOSIS — B370 Candidal stomatitis: Secondary | ICD-10-CM

## 2015-11-09 DIAGNOSIS — M797 Fibromyalgia: Secondary | ICD-10-CM

## 2015-11-10 ENCOUNTER — Other Ambulatory Visit: Payer: Self-pay | Admitting: Family Medicine

## 2015-11-10 DIAGNOSIS — B370 Candidal stomatitis: Secondary | ICD-10-CM | POA: Insufficient documentation

## 2015-11-10 MED ORDER — NYSTATIN 100000 UNIT/ML MT SUSP: 5.0000 mL | Freq: Four times a day (QID) | OROMUCOSAL | 0 refills | Status: DC

## 2015-11-10 MED ORDER — PREGABALIN 150 MG PO CAPS: 150.0000 mg | ORAL_CAPSULE | Freq: Two times a day (BID) | ORAL | 2 refills | Status: DC

## 2015-11-10 NOTE — Telephone Encounter (Signed)
Patient requesting Lyrica and nystatin swish and swallow suspension for oral thrush, likely caused by Breo inhaler, taken for COPD. Lyrica will be ready for pickup and Nystatin suspension is sent to pharmacy.

## 2015-11-15 ENCOUNTER — Other Ambulatory Visit: Payer: Self-pay | Admitting: Family Medicine

## 2015-11-15 DIAGNOSIS — G2581 Restless legs syndrome: Secondary | ICD-10-CM

## 2015-11-17 ENCOUNTER — Ambulatory Visit: Payer: Medicare PPO | Admitting: Family Medicine

## 2015-11-25 ENCOUNTER — Telehealth: Payer: Self-pay | Admitting: Family Medicine

## 2015-11-25 NOTE — Telephone Encounter (Signed)
Spoke with patient and she has been notified that it is too early to refill Hydrocodone medication that was refilled on 11/04/15 she will schedule a medication refill appointment next week

## 2015-11-30 ENCOUNTER — Telehealth: Payer: Self-pay | Admitting: Family Medicine

## 2015-11-30 ENCOUNTER — Encounter: Payer: Self-pay | Admitting: Family Medicine

## 2015-11-30 ENCOUNTER — Ambulatory Visit (INDEPENDENT_AMBULATORY_CARE_PROVIDER_SITE_OTHER): Payer: Medicare PPO | Admitting: Family Medicine

## 2015-11-30 VITALS — BP 128/77 | HR 65 | Temp 98.2°F | Resp 16

## 2015-11-30 DIAGNOSIS — J42 Unspecified chronic bronchitis: Secondary | ICD-10-CM

## 2015-11-30 DIAGNOSIS — F419 Anxiety disorder, unspecified: Secondary | ICD-10-CM

## 2015-11-30 DIAGNOSIS — R0981 Nasal congestion: Secondary | ICD-10-CM

## 2015-11-30 DIAGNOSIS — E785 Hyperlipidemia, unspecified: Secondary | ICD-10-CM | POA: Diagnosis not present

## 2015-11-30 DIAGNOSIS — N39490 Overflow incontinence: Principal | ICD-10-CM

## 2015-11-30 DIAGNOSIS — M797 Fibromyalgia: Secondary | ICD-10-CM | POA: Diagnosis not present

## 2015-11-30 DIAGNOSIS — Z23 Encounter for immunization: Secondary | ICD-10-CM | POA: Diagnosis not present

## 2015-11-30 DIAGNOSIS — N393 Stress incontinence (female) (male): Secondary | ICD-10-CM

## 2015-11-30 LAB — COMPLETE METABOLIC PANEL WITH GFR
ALBUMIN: 3.9 g/dL (ref 3.6–5.1)
ALK PHOS: 103 U/L (ref 33–130)
ALT: 17 U/L (ref 6–29)
AST: 15 U/L (ref 10–35)
BUN: 12 mg/dL (ref 7–25)
CO2: 30 mmol/L (ref 20–31)
Calcium: 9.6 mg/dL (ref 8.6–10.4)
Chloride: 100 mmol/L (ref 98–110)
Creat: 0.86 mg/dL (ref 0.50–0.99)
GFR, Est African American: 83 mL/min (ref >=60)
GFR, Est Non African American: 72 mL/min (ref >=60)
GLUCOSE: 106 mg/dL — AB (ref 65–99)
POTASSIUM: 4.8 mmol/L (ref 3.5–5.3)
SODIUM: 141 mmol/L (ref 135–146)
Total Bilirubin: 0.6 mg/dL (ref 0.2–1.2)
Total Protein: 6.7 g/dL (ref 6.1–8.1)

## 2015-11-30 LAB — LIPID PANEL
CHOL/HDL RATIO: 1.6 ratio (ref <=5.0)
Cholesterol: 155 mg/dL (ref 125–200)
HDL: 98 mg/dL (ref >=46)
LDL CALC: 41 mg/dL (ref <130)
Triglycerides: 78 mg/dL (ref <150)
VLDL: 16 mg/dL (ref <30)

## 2015-11-30 MED ORDER — HYDROCODONE-ACETAMINOPHEN 5-325 MG PO TABS: 1.0000 | ORAL_TABLET | Freq: Four times a day (QID) | ORAL | 0 refills | Status: DC | PRN

## 2015-11-30 MED ORDER — ALPRAZOLAM 0.5 MG PO TABS: 0.5000 mg | ORAL_TABLET | Freq: Every day | ORAL | 0 refills | Status: DC | PRN

## 2015-11-30 MED ORDER — MUCINEX DM MAXIMUM STRENGTH 60-1200 MG PO TB12: 1.0000 | ORAL_TABLET | Freq: Two times a day (BID) | ORAL | 2 refills | Status: AC

## 2015-11-30 MED ORDER — OXYBUTYNIN CHLORIDE 5 MG PO TABS: 5.0000 mg | ORAL_TABLET | Freq: Every day | ORAL | 2 refills | Status: DC

## 2015-11-30 MED ORDER — FLUTICASONE PROPIONATE 50 MCG/ACT NA SUSP: 2.0000 | Freq: Every day | NASAL | 2 refills | Status: DC

## 2015-11-30 MED ORDER — COLESEVELAM HCL 625 MG PO TABS: 625.0000 mg | ORAL_TABLET | Freq: Two times a day (BID) | ORAL | 2 refills | Status: DC

## 2015-11-30 NOTE — Telephone Encounter (Signed)
Was seen today and forgot to ask for a refill on oxybutynin. Please send to [facility]

## 2015-11-30 NOTE — Telephone Encounter (Signed)
Prescription for oxybutynin 5 mg is sent to patient's pharmacy

## 2015-11-30 NOTE — Telephone Encounter (Signed)
Loretta Wade from [facility] is requesting face to face encounter notes for today visit. Please fax to [phone] (P) [phone]

## 2015-11-30 NOTE — Progress Notes (Signed)
Name: Loretta Wade   MRN: [id number]    DOB: [birth year]   Date:11/30/2015       Progress Note  Subjective  Chief Complaint  Chief Complaint  Patient presents with  . Medication Refill    Hydrocodone / welchol    HPI  Fibromyalgia:  Pt. Presents for follow up of Fibromyalgia, she has chronic generalized musculoskeletal pain including in her back, anterior shoulders, thighs, and lower legs. Patient is taking Hydrocodone-Acetaminophen 5-325 mg every 6 hours as needed.Hydrocodone helps relieve her pain, rated at 8 out of 10 this a.m., worse after she had to get up and get ready for the appointment.    Past Medical History:  Diagnosis Date  . Anemia   . CHF (congestive heart failure) (HCC)   . COPD (chronic obstructive pulmonary disease) (HCC)    on 2L home o2  . Edema   . Esophageal reflux   . Fatigue   . Fibromyalgia    Following with pain management  . Hypertension   . Nocturia   . OA (osteoarthritis)   . Sleep apnea    not on CPAP    Past Surgical History:  Procedure Laterality Date  . ABDOMINAL HYSTERECTOMY    . CHOLECYSTECTOMY    . TOTAL KNEE ARTHROPLASTY     right  . VESICOVAGINAL FISTULA CLOSURE W/ TAH      Family History  Problem Relation Age of Onset  . Hypertension Mother   . Diabetes    . Breast cancer Daughter     Social History   Social History  . Marital status: Single    Spouse name: N/A  . Number of children: N/A  . Years of education: N/A   Occupational History  . Not on file.   Social History Main Topics  . Smoking status: Former Smoker    Quit date: 11/04/1983  . Smokeless tobacco: Never Used     Comment: quit several years ago - almost 30 years ago  . Alcohol use No  . Drug use: No  . Sexual activity: Not on file   Other Topics Concern  . Not on file   Social History Narrative   From [facility] assisted living.   Has a walker and wheelchair at baseline.     Current Outpatient Prescriptions:  .  acetaminophen  (TYLENOL) 325 MG tablet, Take 2 tablets (650 mg total) by mouth every 6 (six) hours as needed for mild pain (or Fever >/= 101)., Disp: 30 tablet, Rfl: 0 .  albuterol (PROVENTIL) (2.5 MG/3ML) 0.083% nebulizer solution, USE ONE TREATMENT EVERY 6 HOURS AS NEEDED FOR WHEEZING OR SHORTNESS OF BREATH, Disp: 360 mL, Rfl: 3 .  ANORO ELLIPTA 62.5-25 MCG/INH AEPB, Inhale 1 puff into the lungs daily., Disp: , Rfl:  .  Chlorphen-Phenyleph-APAP (CORICIDIN D COLD/FLU/SINUS) 2-5-325 MG TABS, Take 2 tablets by mouth daily as needed (for nasal congestion/cough)., Disp: , Rfl:  .  cholecalciferol (VITAMIN D) 1000 UNITS tablet, Take 1,000 Units by mouth daily. , Disp: , Rfl:  .  colesevelam (WELCHOL) 625 MG tablet, Take 1 tablet (625 mg total) by mouth 2 (two) times daily with a meal., Disp: 60 tablet, Rfl: 2 .  DEXILANT 60 MG capsule, TAKE ONE (1) CAPSULE EACH DAY, Disp: 90 capsule, Rfl: 1 .  fluticasone (FLONASE) 50 MCG/ACT nasal spray, Place 2 sprays into both nostrils daily. , Disp: , Rfl:  .  furosemide (LASIX) 40 MG tablet, Take 1 tablet (40 mg total) by mouth 2 (two)  times daily., Disp: 30 tablet, Rfl: 60 .  guaiFENesin-dextromethorphan (ROBITUSSIN DM) 100-10 MG/5ML syrup, Take 5-10 mLs by mouth every 4 (four) hours as needed for cough. , Disp: , Rfl:  .  HYDROcodone-acetaminophen (NORCO/VICODIN) 5-325 MG tablet, Take 1 tablet by mouth every 6 (six) hours as needed for moderate pain., Disp: 120 tablet, Rfl: 0 .  Magnesium 250 MG TABS, Take 250 mg by mouth daily., Disp: , Rfl:  .  nystatin (MYCOSTATIN) 100000 UNIT/ML suspension, Take 5 mLs (500,000 Units total) by mouth 4 (four) times daily., Disp: 180 mL, Rfl: 0 .  oxybutynin (DITROPAN) 5 MG tablet, Take 1 tablet (5 mg total) by mouth daily., Disp: 30 tablet, Rfl: 2 .  pantoprazole (PROTONIX) 40 MG tablet, TAKE ONE (1) TABLET BY MOUTH TWO (2) TIMES DAILY, Disp: 60 tablet, Rfl: 2 .  pregabalin (LYRICA) 150 MG capsule, Take 1 capsule (150 mg total) by mouth 2 (two)  times daily., Disp: 60 capsule, Rfl: 2 .  propranolol ER (INDERAL LA) 80 MG 24 hr capsule, TAKE ONE CAPSULE BY MOUTH DAILY, Disp: 90 capsule, Rfl: 0 .  rOPINIRole (REQUIP) 0.25 MG tablet, Take 3 tablets (0.75 mg total) by mouth at bedtime., Disp: 90 tablet, Rfl: 2 .  ALPRAZolam (XANAX) 0.5 MG tablet, Take 0.5 mg by mouth daily., Disp: , Rfl:  .  chlorpheniramine-HYDROcodone (TUSSIONEX) 10-8 MG/5ML SUER, Take 5 mLs by mouth every 12 (twelve) hours as needed for cough. (Patient not taking: Reported on 11/30/2015), Disp: 115 mL, Rfl: 0 .  citalopram (CELEXA) 20 MG tablet, Take 20 mg by mouth daily., Disp: , Rfl:  .  loperamide (IMODIUM) 2 MG capsule, Take 2-4 mg by mouth as needed for diarrhea or loose stools., Disp: , Rfl:  .  predniSONE (DELTASONE) 10 MG tablet, 20 mg po daily for 2 days, 10 mg po daily for 2 days. (Patient not taking: Reported on 11/30/2015), Disp: 6 tablet, Rfl: 0 .  ranitidine (ZANTAC) 150 MG tablet, Take 1 tablet (150 mg total) by mouth 2 (two) times daily. (Patient not taking: Reported on 11/30/2015), Disp: 180 tablet, Rfl: 0 .  rOPINIRole (REQUIP) 0.25 MG tablet, TAKE THREE TABLETS BY MOUTH EVERY NIGHT AT BEDTIME (Patient not taking: Reported on 11/30/2015), Disp: 270 tablet, Rfl: 0  No Known Allergies   Review of Systems  Constitutional: Positive for malaise/fatigue. Negative for chills and fever.  Respiratory: Positive for cough and sputum production. Negative for shortness of breath.   Cardiovascular: Negative for chest pain.  Gastrointestinal: Negative for abdominal pain.  Musculoskeletal: Positive for back pain and myalgias.    Objective  Vitals:   11/30/15 0849  BP: 128/77  Pulse: 65  Resp: 16  Temp: 98.2 F (36.8 C)  TempSrc: Oral  SpO2: 96%    Physical Exam  Constitutional: She is oriented to person, place, and time and well-developed, well-nourished, and in no distress.  Cardiovascular: Normal rate, regular rhythm, S1 normal, S2 normal and normal heart  sounds.   No murmur heard. Pulmonary/Chest: Effort normal. She has decreased breath sounds (diminished breath sounds bilaterally, mild expiratory wheezing in right anterior lung field.). She has wheezes in the right upper field.  Abdominal: Soft. Bowel sounds are normal. There is no tenderness.  Neurological: She is alert and oriented to person, place, and time.  Psychiatric: Mood, memory, affect and judgment normal.  Nursing note and vitals reviewed.    Assessment & Plan  1. Dyslipidemia Ordered fasting lipid panel, adjust medication if needed.  - colesevelam [facility])  625 MG tablet; Take 1 tablet (625 mg total) by mouth 2 (two) times daily with a meal.  Dispense: 60 tablet; Refill: 2 - Lipid Profile - COMPLETE METABOLIC PANEL WITH GFR  2. Fibromyalgia Stable, responsive to opioid therapy. Refills provided - HYDROcodone-acetaminophen (NORCO/VICODIN) 5-325 MG tablet; Take 1 tablet by mouth every 6 (six) hours as needed for moderate pain.  Dispense: 120 tablet; Refill: 0  3. Anxiety Initially prescribed by pulmonologist Dr. Meredeth Ide to help with anxiety, he has recommended patient to obtain refills from PCP. Patient reports alprazolam working well, helps her breathe better, also ameliorates anxiety. - ALPRAZolam (XANAX) 0.5 MG tablet; Take 1 tablet (0.5 mg total) by mouth daily as needed for anxiety.  Dispense: 30 tablet; Refill: 0  4. Chronic nasal congestion For chronic nasal congestion, use as directed - fluticasne (FLONASE) 50 MCG/ACT nasal spray; Place 2 sprays into both nostrils daily.  Dispense: 16 g; Refill: 2  5. Chronic bronchitis, unspecified chronic bronchitis type (HCC)  - Dextromethorphan-Guaifenesin (MUCINEX DM MAXIMUM STRENGTH) 60-1200 MG TB12; Take 1 tablet by mouth 2 (two) times daily.  Dispense: 30 each; Refill: 2   6. Need for immunization against influenza  - Flu Vaccine QUAD 36+ mos PF IM (Fluarix & Fluzone Quad PF)  Ameliya Nicotra Asad A. [facility]ay[facility]  [facility] 11/30/2015 9:10 AM

## 2015-11-30 NOTE — Telephone Encounter (Signed)
Patient verbally informed prescription has been sent to pharmacy.

## 2015-11-30 NOTE — Telephone Encounter (Signed)
Face face encounter notes have been faxed to [phone] attn: Maggie at [facility] per request

## 2015-12-13 ENCOUNTER — Telehealth: Payer: Self-pay | Admitting: Family Medicine

## 2015-12-13 NOTE — Telephone Encounter (Signed)
Patient is checking status on paperwork from [place] that is suppose to be sent to [facility] for pull ups. It was suppose to be done about 2 weeks ago. States Dennison Bulla has spoken with Burman Nieves stating that you where going to fax the paperwork in. Please return patient call because she is completely out of the pull ups

## 2015-12-14 NOTE — Telephone Encounter (Signed)
Patient is requesting return call

## 2015-12-15 ENCOUNTER — Telehealth: Payer: Self-pay | Admitting: Family Medicine

## 2015-12-15 NOTE — Telephone Encounter (Signed)
Please call Maggie from [facility] back on behalf of patient. [phone]

## 2015-12-17 ENCOUNTER — Telehealth: Payer: Self-pay

## 2015-12-17 ENCOUNTER — Telehealth: Payer: Self-pay | Admitting: Family Medicine

## 2015-12-17 NOTE — Telephone Encounter (Signed)
Please schedule patient for an appointment to discuss urinary incontinence and supplies to satisfy face-to-face requirement.

## 2015-12-17 NOTE — Telephone Encounter (Signed)
Routed to [name] to edit office note to include conversation and notes concerning adult pull ups so face-to-face notes can be faxed to [facility], patient is completely out

## 2015-12-17 NOTE — Telephone Encounter (Signed)
Returned patient call and spoke with her concerning adult pull ups and face to face office notes being faxed to [facility] has been completed

## 2015-12-17 NOTE — Telephone Encounter (Signed)
[facility] care manager would like a call back about patient. She states this is her second time calling this week.

## 2015-12-17 NOTE — Telephone Encounter (Signed)
Spoke with case Chartered certified accountant concerning [employer], states she has spoken with Sherril Cong. I informed her that I will touch bases with Bonnita Nasuti and contact her on Monday

## 2015-12-17 NOTE — Telephone Encounter (Signed)
Spoke with [facility] from [facility] and will fax completed face-to-face office notes

## 2015-12-20 ENCOUNTER — Telehealth: Payer: Self-pay | Admitting: Family Medicine

## 2015-12-20 ENCOUNTER — Telehealth: Payer: Self-pay

## 2015-12-20 NOTE — Telephone Encounter (Signed)
Return call and left a voice message. Regarding face-to-face documentation for adult pull-ups, she will need an appointment. We can address any concerns she may have regarding allergies at the same time as well.

## 2015-12-20 NOTE — Telephone Encounter (Signed)
Routed to [name] patient would like for you to call her concerning bedside commode and allergy medication as well as a face to face encounter concerning adult pull ups, please return pt call she states she is completely out

## 2015-12-20 NOTE — Telephone Encounter (Signed)
Pt would like a call back. She states its very important.

## 2015-12-21 NOTE — Telephone Encounter (Signed)
Spoke with patient and face to face encounter notes regarding incontinence has been faxed to Cat and Maggie at [facility].

## 2015-12-29 ENCOUNTER — Telehealth: Payer: Self-pay

## 2015-12-29 DIAGNOSIS — M797 Fibromyalgia: Secondary | ICD-10-CM

## 2015-12-29 MED ORDER — HYDROCODONE-ACETAMINOPHEN 5-325 MG PO TABS
1.0000 | ORAL_TABLET | Freq: Four times a day (QID) | ORAL | 0 refills | Status: DC | PRN
Start: 2015-12-29 — End: 2016-01-25

## 2015-12-29 NOTE — Telephone Encounter (Signed)
Medication has been refilled and ready for patient pick up per Dr. Shah 

## 2016-01-13 ENCOUNTER — Other Ambulatory Visit: Payer: Self-pay | Admitting: Family Medicine

## 2016-01-13 DIAGNOSIS — K219 Gastro-esophageal reflux disease without esophagitis: Secondary | ICD-10-CM

## 2016-01-18 ENCOUNTER — Telehealth: Payer: Self-pay

## 2016-01-18 DIAGNOSIS — M797 Fibromyalgia: Secondary | ICD-10-CM

## 2016-01-18 DIAGNOSIS — B370 Candidal stomatitis: Secondary | ICD-10-CM

## 2016-01-18 NOTE — Telephone Encounter (Signed)
Routed to [name] Patient states she has thrush again and is requesting a refill on Nystatin, please

## 2016-01-18 NOTE — Telephone Encounter (Signed)
Patient needs an appointment for evaluation of symptoms

## 2016-01-20 NOTE — Telephone Encounter (Signed)
Routed to Dr. Shah for approval 

## 2016-01-25 MED ORDER — HYDROCODONE-ACETAMINOPHEN 5-325 MG PO TABS: 1.0000 | ORAL_TABLET | Freq: Four times a day (QID) | ORAL | 0 refills | Status: DC | PRN

## 2016-01-25 NOTE — Telephone Encounter (Signed)
Patient is requesting refill on hydrocodone medication. Was told at last visit that she would not have to come in every month for this refill that he was going to start scheduling her for every 3 months. Patient is asking that you please return call today before the holidays. States that her grandson also heard the conversation.

## 2016-01-25 NOTE — Telephone Encounter (Signed)
Prescription for hydrocodone is printed and ready for pickup 

## 2016-01-25 NOTE — Addendum Note (Signed)
Addended byManuella Ghazi, [place] Mokry A A on: 01/25/2016 06:39 PM   Modules accepted: Orders

## 2016-01-26 ENCOUNTER — Telehealth: Payer: Self-pay | Admitting: Family Medicine

## 2016-01-26 NOTE — Telephone Encounter (Signed)
Prescription for hydrocodone is ready for pickup.

## 2016-01-26 NOTE — Telephone Encounter (Signed)
Pt would like a call back. She needs her pain meds. Pt refuses to come in for an appt. She was offered 3 times to schedule an appt

## 2016-01-28 ENCOUNTER — Telehealth: Payer: Self-pay | Admitting: Family Medicine

## 2016-01-28 NOTE — Telephone Encounter (Signed)
Received call from [facility] that patient called requesting refill for Nystatin oral suspension due to recurrent thrush. Advised her that she may call in   nystatin (MYCOSTATIN) 100000 UNIT/ML suspension [id number]  Order Details  Dose: 5 mL Route: Oral Frequency: 4 times daily  Indications of Use: Oropharyngeal Candidiasis  Dispense Quantity:  180 mL        To pharmacy of her choice.

## 2016-02-01 ENCOUNTER — Ambulatory Visit: Payer: Medicare PPO | Admitting: Family Medicine

## 2016-02-01 ENCOUNTER — Telehealth: Payer: Self-pay | Admitting: Family Medicine

## 2016-02-01 NOTE — Telephone Encounter (Signed)
Pt would like a call back about getting more about getting her some more help at her house.

## 2016-02-03 ENCOUNTER — Telehealth: Payer: Self-pay

## 2016-02-03 NOTE — Telephone Encounter (Signed)
Routed to [name] patient is requesting a referral or [place] she need help at home with bathing and basic home care please return patient call or place referral.

## 2016-02-03 NOTE — Telephone Encounter (Signed)
Please schedule patient for an appointment to discuss referral and to satisfy the documentation requirements based on her insurance.

## 2016-02-03 NOTE — Telephone Encounter (Signed)
Left voicemail asking patient to return call.

## 2016-02-08 ENCOUNTER — Encounter: Payer: Self-pay | Admitting: Family Medicine

## 2016-02-08 ENCOUNTER — Ambulatory Visit (INDEPENDENT_AMBULATORY_CARE_PROVIDER_SITE_OTHER): Payer: Medicare PPO | Admitting: Family Medicine

## 2016-02-08 DIAGNOSIS — Z741 Need for assistance with personal care: Secondary | ICD-10-CM | POA: Diagnosis not present

## 2016-02-08 DIAGNOSIS — R300 Dysuria: Secondary | ICD-10-CM

## 2016-02-08 DIAGNOSIS — B372 Candidiasis of skin and nail: Secondary | ICD-10-CM

## 2016-02-08 DIAGNOSIS — M797 Fibromyalgia: Secondary | ICD-10-CM

## 2016-02-08 MED ORDER — PREGABALIN 150 MG PO CAPS: 150.0000 mg | ORAL_CAPSULE | Freq: Two times a day (BID) | ORAL | 2 refills | Status: DC

## 2016-02-08 MED ORDER — NYSTATIN 100000 UNIT/GM EX OINT: 1.0000 "application " | TOPICAL_OINTMENT | Freq: Three times a day (TID) | CUTANEOUS | 0 refills | Status: DC

## 2016-02-08 MED ORDER — FLUCONAZOLE 150 MG PO TABS: 150.0000 mg | ORAL_TABLET | Freq: Once | ORAL | 0 refills | Status: AC

## 2016-02-08 MED ORDER — HYDROCODONE-ACETAMINOPHEN 5-325 MG PO TABS: 1.0000 | ORAL_TABLET | Freq: Four times a day (QID) | ORAL | 0 refills | Status: DC | PRN

## 2016-02-08 NOTE — Progress Notes (Signed)
Name: Loretta Wade   MRN: [id number]    DOB: January 30, 1951   Date:02/08/2016       Progress Note  Subjective  Chief Complaint  Chief Complaint  Patient presents with  . Rash    Under breasts / inner thighs  . Medication Refill    Rash  This is a new problem. The current episode started in the past 7 days. The affected locations include the groin and chest (rash present underneath the breasts and in groin folds). The rash is characterized by burning, itchiness and redness. She was exposed to nothing. Past treatments include anti-itch cream (applied topical OTC cream Desitin and cornstarch powder). The treatment provided no relief.  Dysuria   This is a new problem. The current episode started in the past 7 days. The quality of the pain is described as burning. There has been no fever. She is not sexually active. Associated symptoms include frequency and urgency. Pertinent negatives include no chills, discharge, hematuria or nausea. She has tried home medications (tried some kind of 'red pill' which did not help) for the symptoms.   Pt. needs assistance with bathing, because of chronic comorbidities including obesity, difficult for her to take a bath. She is requesting to authorize assistance via [facility]. Her family member cooks her meals and she is able to get around the house in wheelchair and she can walk for short periods of time.    Past Medical History:  Diagnosis Date  . Anemia   . CHF (congestive heart failure) (HCC)   . COPD (chronic obstructive pulmonary disease) (HCC)    on 2L home o2  . Edema   . Esophageal reflux   . Fatigue   . Fibromyalgia    Following with pain management  . Hypertension   . Nocturia   . OA (osteoarthritis)   . Sleep apnea    not on CPAP    Past Surgical History:  Procedure Laterality Date  . ABDOMINAL HYSTERECTOMY    . CHOLECYSTECTOMY    . TOTAL KNEE ARTHROPLASTY     right  . VESICOVAGINAL FISTULA CLOSURE W/ TAH      Family  History  Problem Relation Age of Onset  . Hypertension Mother   . Diabetes    . Breast cancer Daughter     Social History   Social History  . Marital status: Single    Spouse name: N/A  . Number of children: N/A  . Years of education: N/A   Occupational History  . Not on file.   Social History Main Topics  . Smoking status: Former Smoker    Quit date: 11/04/1983  . Smokeless tobacco: Never Used     Comment: quit several years ago - almost 30 years ago  . Alcohol use No  . Drug use: No  . Sexual activity: Not on file   Other Topics Concern  . Not on file   Social History Narrative   From [facility] assisted living.   Has a walker and wheelchair at baseline.     Current Outpatient Prescriptions:  .  acetaminophen (TYLENOL) 325 MG tablet, Take 2 tablets (650 mg total) by mouth every 6 (six) hours as needed for mild pain (or Fever >/= 101)., Disp: 30 tablet, Rfl: 0 .  albuterol (PROVENTIL) (2.5 MG/3ML) 0.083% nebulizer solution, USE ONE TREATMENT EVERY 6 HOURS AS NEEDED FOR WHEEZING OR SHORTNESS OF BREATH, Disp: 360 mL, Rfl: 3 .  ALPRAZolam (XANAX) 0.5 MG tablet, Take 1  tablet (0.5 mg total) by mouth daily as needed for anxiety., Disp: 30 tablet, Rfl: 0 .  ANORO ELLIPTA 62.5-25 MCG/INH AEPB, Inhale 1 puff into the lungs daily., Disp: , Rfl:  .  Chlorphen-Phenyleph-APAP (CORICIDIN D COLD/FLU/SINUS) 2-5-325 MG TABS, Take 2 tablets by mouth daily as needed (for nasal congestion/cough)., Disp: , Rfl:  .  chlorpheniramine-HYDROcodone (TUSSIONEX) 10-8 MG/5ML SUER, Take 5 mLs by mouth every 12 (twelve) hours as needed for cough., Disp: 115 mL, Rfl: 0 .  cholecalciferol (VITAMIN D) 1000 UNITS tablet, Take 1,000 Units by mouth daily. , Disp: , Rfl:  .  citalopram (CELEXA) 20 MG tablet, Take 20 mg by mouth daily., Disp: , Rfl:  .  colesevelam (WELCHOL) 625 MG tablet, Take 1 tablet (625 mg total) by mouth 2 (two) times daily with a meal., Disp: 60 tablet, Rfl: 2 .  DEXILANT 60 MG  capsule, TAKE ONE (1) CAPSULE EACH DAY, Disp: 90 capsule, Rfl: 1 .  furosemide (LASIX) 40 MG tablet, Take 1 tablet (40 mg total) by mouth 2 (two) times daily., Disp: 30 tablet, Rfl: 60 .  guaiFENesin-dextromethorphan (ROBITUSSIN DM) 100-10 MG/5ML syrup, Take 5-10 mLs by mouth every 4 (four) hours as needed for cough. , Disp: , Rfl:  .  HYDROcodone-acetaminophen (NORCO/VICODIN) 5-325 MG tablet, Take 1 tablet by mouth every 6 (six) hours as needed for moderate pain., Disp: 120 tablet, Rfl: 0 .  loperamide (IMODIUM) 2 MG capsule, Take 2-4 mg by mouth as needed for diarrhea or loose stools., Disp: , Rfl:  .  Magnesium 250 MG TABS, Take 250 mg by mouth daily., Disp: , Rfl:  .  nystatin (MYCOSTATIN) 100000 UNIT/ML suspension, Take 5 mLs (500,000 Units total) by mouth 4 (four) times daily., Disp: 180 mL, Rfl: 0 .  oxybutynin (DITROPAN) 5 MG tablet, Take 1 tablet (5 mg total) by mouth daily., Disp: 30 tablet, Rfl: 2 .  pantoprazole (PROTONIX) 40 MG tablet, TAKE ONE (1) TABLET BY MOUTH TWO (2) TIMES DAILY, Disp: 60 tablet, Rfl: 2 .  predniSONE (DELTASONE) 10 MG tablet, 20 mg po daily for 2 days, 10 mg po daily for 2 days., Disp: 6 tablet, Rfl: 0 .  pregabalin (LYRICA) 150 MG capsule, Take 1 capsule (150 mg total) by mouth 2 (two) times daily., Disp: 60 capsule, Rfl: 2 .  propranolol ER (INDERAL LA) 80 MG 24 hr capsule, TAKE ONE CAPSULE BY MOUTH DAILY, Disp: 90 capsule, Rfl: 0 .  ranitidine (ZANTAC) 150 MG tablet, TAKE ONE TABLET TWICE DAILY, Disp: 180 tablet, Rfl: 1 .  rOPINIRole (REQUIP) 0.25 MG tablet, Take 3 tablets (0.75 mg total) by mouth at bedtime., Disp: 90 tablet, Rfl: 2 .  rOPINIRole (REQUIP) 0.25 MG tablet, TAKE THREE TABLETS BY MOUTH EVERY NIGHT AT BEDTIME, Disp: 270 tablet, Rfl: 0 .  fluticasone (FLONASE) 50 MCG/ACT nasal spray, Place 2 sprays into both nostrils daily., Disp: 16 g, Rfl: 2  No Known Allergies   Review of Systems  Constitutional: Negative for chills.  Gastrointestinal:  Negative for nausea.  Genitourinary: Positive for dysuria, frequency and urgency. Negative for hematuria.  Skin: Positive for rash.    Objective  Vitals:   02/08/16 1004  BP: 123/73  Pulse: 67  Resp: 17  Temp: 98.7 F (37.1 C)  TempSrc: Oral  SpO2: 93%    Physical Exam  Constitutional: She is oriented to person, place, and time and well-developed, well-nourished, and in no distress. Vital signs are normal.  Morbidly obese well developed woman sitting in wheelchair,  no distress  Cardiovascular: Normal rate, regular rhythm and normal heart sounds.   No murmur heard. Pulmonary/Chest: Effort normal and breath sounds normal. She has no wheezes.  Abdominal: Soft. Bowel sounds are normal. There is no tenderness. There is CVA tenderness.  Neurological: She is oriented to person, place, and time.  Skin: Rash noted. Rash is macular.     Macular erythematous pruritic rash underneath breasts and on both groin folds, difficult to visualize clearly because of too much corn starch powder.   Psychiatric: Mood, memory, affect and judgment normal.  Nursing note and vitals reviewed.    Assessment & Plan  1. Candidal intertrigo Start on nystatin ointment along with 2 tablet course of Diflucan, advised to allow the topical preparation to penetrate the affected areas including underneath the breast. - fluconazole (DIFLUCAN) 150 MG tablet; Take 1 tablet (150 mg total) by mouth once. Take one tablet on day 1, second tablet on day 4  Dispense: 2 tablet; Refill: 0 - nystatin ointment (MYCOSTATIN); Apply 1 application topically 3 (three) times daily.  Dispense: 60 g; Refill: 0  2. Dysuria A urine sample for point-of-care testing could not be obtained in office today, we will order urinalysis, micro-and culture. - Urinalysis, Routine w reflex microscopic - CULTURE, URINE COMPREHENSIVE  3. Requires assistance with activities of daily living (ADL) We'll request [facility] to evaluate patient  for assistance with ADLs including bathing.  4. Fibromyalgia Stable, responsive to hydrocodone and Lyrica taken as prescribed, patient compliant with controlled substances agreement. Refills provided and follow-up in one month - pregabalin (LYRICA) 150 MG capsule; Take 1 capsule (150 mg total) by mouth 2 (two) times daily.  Dispense: 60 capsule; Refill: 2 - HYDROcodone-acetaminophen (NORCO/VICODIN) 5-325 MG tablet; Take 1 tablet by mouth every 6 (six) hours as needed for moderate pain.  Dispense: 120 tablet; Refill: 0   Jeffie Widdowson Asad A. [facility] 02/08/2016 10:32 AM

## 2016-02-09 LAB — URINALYSIS, ROUTINE W REFLEX MICROSCOPIC
Bilirubin Urine: NEGATIVE
Glucose, UA: NEGATIVE
Hgb urine dipstick: NEGATIVE
Ketones, ur: NEGATIVE
LEUKOCYTES UA: NEGATIVE
NITRITE: NEGATIVE
PH: 7.5 (ref 5.0–8.0)
Protein, ur: NEGATIVE
SPECIFIC GRAVITY, URINE: 1.024 (ref 1.001–1.035)

## 2016-02-10 ENCOUNTER — Telehealth: Payer: Self-pay | Admitting: Family Medicine

## 2016-02-10 NOTE — Telephone Encounter (Signed)
Patient states that she spoke with [facility]. They are waiting on a referral and they are needing Home Health patient note.

## 2016-02-10 NOTE — Telephone Encounter (Signed)
Office visit note from 02/08/2016 has documentation on  assistance with activities of daily living.

## 2016-02-11 LAB — CULTURE, URINE COMPREHENSIVE

## 2016-02-16 ENCOUNTER — Other Ambulatory Visit: Payer: Self-pay | Admitting: Family Medicine

## 2016-02-16 DIAGNOSIS — N3000 Acute cystitis without hematuria: Secondary | ICD-10-CM

## 2016-02-16 DIAGNOSIS — Z741 Need for assistance with personal care: Secondary | ICD-10-CM

## 2016-02-16 MED ORDER — AMOXICILLIN-POT CLAVULANATE 875-125 MG PO TABS: 1.0000 | ORAL_TABLET | Freq: Two times a day (BID) | ORAL | 0 refills | Status: DC

## 2016-02-17 ENCOUNTER — Telehealth: Payer: Self-pay

## 2016-02-17 ENCOUNTER — Other Ambulatory Visit: Payer: Self-pay | Admitting: Family Medicine

## 2016-02-17 NOTE — Telephone Encounter (Signed)
I got a call from Loretta Wade with [facility] stating that they could only provide personal services if she has a skilled services need.  After speaking with [name], I informed her that the only thing the patient stated that she needed was someone to help her around the house.  She gave me some options for this patient but is not sure if her insurance will cover it.  [place] [phone]  I then contacted this patient to let her know and she stated that she did mention about having physical therapy and would like to resume.  Please advise

## 2016-02-22 ENCOUNTER — Telehealth: Payer: Self-pay

## 2016-02-22 NOTE — Telephone Encounter (Signed)
Patient called stating that [facility] is needing a face to face letter that she said was done during her last visit.  She also stated that they have yet to received the order for physical therapy.  Please call the patient

## 2016-02-23 ENCOUNTER — Telehealth: Payer: Self-pay | Admitting: Family Medicine

## 2016-02-23 ENCOUNTER — Other Ambulatory Visit: Payer: Self-pay | Admitting: Family Medicine

## 2016-02-23 DIAGNOSIS — N393 Stress incontinence (female) (male): Secondary | ICD-10-CM

## 2016-02-23 DIAGNOSIS — N39490 Overflow incontinence: Principal | ICD-10-CM

## 2016-02-23 NOTE — Telephone Encounter (Signed)
Spoke with patient and confirmed that referral order was placed on 02/16/2016

## 2016-02-23 NOTE — Telephone Encounter (Signed)
Pt has called back saying that she was to have been called today about getting an order sent to [facility] for [facility] Therapy and she has not heard anything or anyone yet. Says that [facility] told her that we the office would have to send a order to them for the Physical Therapy. She said that this was talked about at her last appt a few wks ago and then the patient said that she had also called in several times last week and this week with no resolution to this issue. Please advise.

## 2016-02-23 NOTE — Telephone Encounter (Signed)
Pt would like a call back please. °

## 2016-02-24 NOTE — Telephone Encounter (Signed)
Spoke with patient and she has been notified that [name] is out of the office until 02/29/2016 and once he returns we will have him to re order referral and add on physical therapy.

## 2016-02-25 ENCOUNTER — Other Ambulatory Visit: Payer: Self-pay

## 2016-02-25 DIAGNOSIS — B372 Candidiasis of skin and nail: Secondary | ICD-10-CM

## 2016-02-25 MED ORDER — NYSTATIN 100000 UNIT/GM EX OINT: 1.0000 "application " | TOPICAL_OINTMENT | Freq: Three times a day (TID) | CUTANEOUS | 0 refills | Status: DC

## 2016-02-25 NOTE — Telephone Encounter (Signed)
Medication has been refilled and sent to [facility] 

## 2016-02-25 NOTE — Telephone Encounter (Signed)
Routed to Dr. Ancil Boozer for approval

## 2016-02-29 ENCOUNTER — Ambulatory Visit: Payer: Medicare PPO | Admitting: Family Medicine

## 2016-02-29 ENCOUNTER — Telehealth: Payer: Self-pay | Admitting: Family Medicine

## 2016-02-29 ENCOUNTER — Other Ambulatory Visit: Payer: Self-pay | Admitting: Family Medicine

## 2016-02-29 DIAGNOSIS — E785 Hyperlipidemia, unspecified: Secondary | ICD-10-CM

## 2016-02-29 NOTE — Telephone Encounter (Signed)
Pt would like a call from Dr Manuella Ghazi about [place].

## 2016-03-01 ENCOUNTER — Telehealth: Payer: Self-pay | Admitting: Family Medicine

## 2016-03-01 NOTE — Telephone Encounter (Signed)
PT NEEDS YOU TO CALL HER ABOUT [facility]. SAYS THAT THEY HAVE NOT HEARD FROM DR SHAW ABOUT PHYSICAL THERAPY AND HOME CARE IN HER HOME.

## 2016-03-01 NOTE — Telephone Encounter (Signed)
Spoke with patient, patient needs assistance with bathing, we will have to discuss with [facility] regarding the addition of physical therapy services to help with mobility and breathing. Please contact [facility] and we'll be glad to talk to them regarding this patient's care needs

## 2016-03-01 NOTE — Telephone Encounter (Signed)
[name] spoke with patient and she has been notified that he will contact [facility] and add physical therapy to her home health referral

## 2016-03-02 ENCOUNTER — Telehealth: Payer: Self-pay | Admitting: Family Medicine

## 2016-03-02 NOTE — Telephone Encounter (Signed)
Spoke with dr Manuella Ghazi yesterday and he told her that he was going to get with you (temeka) that one of you was going to call her back yesterday and as of today she has not heard anything.  Wanted to know what is going on with [facility] have been waiting for about 3 weeks. Please return call

## 2016-03-02 NOTE — Telephone Encounter (Signed)
Per Dr Manuella Ghazi

## 2016-03-02 NOTE — Telephone Encounter (Signed)
Please contact [facility] tomorrow and I'll be glad to speak to them regarding this patient's need for physical therapy and home health services.

## 2016-03-07 ENCOUNTER — Telehealth: Payer: Self-pay

## 2016-03-07 ENCOUNTER — Telehealth: Payer: Self-pay | Admitting: Family Medicine

## 2016-03-07 DIAGNOSIS — R0981 Nasal congestion: Secondary | ICD-10-CM

## 2016-03-07 DIAGNOSIS — Z7409 Other reduced mobility: Secondary | ICD-10-CM

## 2016-03-07 DIAGNOSIS — Z6841 Body Mass Index (BMI) 40.0 and over, adult: Secondary | ICD-10-CM

## 2016-03-07 MED ORDER — FLUTICASONE PROPIONATE 50 MCG/ACT NA SUSP: 2.0000 | Freq: Every day | NASAL | 2 refills | Status: DC

## 2016-03-07 NOTE — Telephone Encounter (Signed)
Pt requesting a call back. 

## 2016-03-07 NOTE — Telephone Encounter (Signed)
Medication has been refilled and sent to [facility] 

## 2016-03-07 NOTE — Telephone Encounter (Signed)
Routed to Dr. Shah for patient callback request 

## 2016-03-08 ENCOUNTER — Other Ambulatory Visit: Payer: Self-pay | Admitting: Family Medicine

## 2016-03-09 NOTE — Addendum Note (Signed)
Addended byManuella Ghazi, Maze Corniel A A on: 03/09/2016 05:16 PM   Modules accepted: Orders

## 2016-03-09 NOTE — Telephone Encounter (Signed)
Returned call and discussed obtaining personal care services such as assistance with bathing. Patient has contacted [facility] and they need an order from the physician to process the referral for physical therapy. I contacted [facility] and they need an order along with the office notes faxed to them to process the referral for physical therapy and home health aide. I have ordered a referral for physical therapy using the diagnoses poor mobility and morbid obesity. Please fax the necessary documents to [facility].

## 2016-03-21 ENCOUNTER — Telehealth: Payer: Self-pay | Admitting: Family Medicine

## 2016-03-21 NOTE — Telephone Encounter (Signed)
Requesting refill for hydrocodone 

## 2016-03-24 ENCOUNTER — Other Ambulatory Visit: Payer: Self-pay | Admitting: Family Medicine

## 2016-03-24 DIAGNOSIS — M797 Fibromyalgia: Secondary | ICD-10-CM

## 2016-03-24 MED ORDER — HYDROCODONE-ACETAMINOPHEN 5-325 MG PO TABS: 1.0000 | ORAL_TABLET | Freq: Four times a day (QID) | ORAL | 0 refills | Status: DC | PRN

## 2016-03-27 ENCOUNTER — Ambulatory Visit: Payer: Medicare PPO

## 2016-03-30 ENCOUNTER — Telehealth: Payer: Self-pay | Admitting: Family Medicine

## 2016-03-30 NOTE — Telephone Encounter (Signed)
Pt would like a call back

## 2016-03-31 NOTE — Telephone Encounter (Signed)
Patient has a cough for a few days and is requesting to be started on prednisone to relieve the cough. I have explained that she needs to be seen and evaluated to determine what is the source of the cough and then will be prescribed appropriate treatment. I asked for an appointment early next week or if she gets worse in the interim, she may be seen at an urgent care. Patient verbalized agreement with the plan

## 2016-04-05 ENCOUNTER — Telehealth: Payer: Self-pay | Admitting: Family Medicine

## 2016-04-05 NOTE — Telephone Encounter (Signed)
Please schedule patient for an appointment to evaluate her symptoms and prescribe appropriate therapy

## 2016-04-05 NOTE — Telephone Encounter (Signed)
Requesting a prescription for nystatin for thrush. Please send to [facility]. States she was on this before.

## 2016-04-06 NOTE — Telephone Encounter (Signed)
Pt informed and states she will go to a walk in clinic

## 2016-04-09 ENCOUNTER — Emergency Department
Admission: EM | Admit: 2016-04-09 | Discharge: 2016-04-10 | Disposition: A | Discharge status: Home / Self Care | Payer: Medicare PPO | Attending: Emergency Medicine | Admitting: Emergency Medicine

## 2016-04-09 ENCOUNTER — Encounter: Payer: Self-pay | Admitting: Emergency Medicine

## 2016-04-09 ENCOUNTER — Emergency Department: Payer: Medicare PPO

## 2016-04-09 DIAGNOSIS — M6281 Muscle weakness (generalized): Secondary | ICD-10-CM

## 2016-04-09 DIAGNOSIS — M25562 Pain in left knee: Secondary | ICD-10-CM | POA: Diagnosis not present

## 2016-04-09 DIAGNOSIS — I509 Heart failure, unspecified: Secondary | ICD-10-CM | POA: Insufficient documentation

## 2016-04-09 DIAGNOSIS — Z79899 Other long term (current) drug therapy: Secondary | ICD-10-CM | POA: Insufficient documentation

## 2016-04-09 DIAGNOSIS — Z87891 Personal history of nicotine dependence: Secondary | ICD-10-CM | POA: Diagnosis not present

## 2016-04-09 DIAGNOSIS — R1084 Generalized abdominal pain: Secondary | ICD-10-CM | POA: Insufficient documentation

## 2016-04-09 DIAGNOSIS — R0602 Shortness of breath: Secondary | ICD-10-CM | POA: Diagnosis present

## 2016-04-09 DIAGNOSIS — J441 Chronic obstructive pulmonary disease with (acute) exacerbation: Secondary | ICD-10-CM | POA: Diagnosis not present

## 2016-04-09 DIAGNOSIS — R300 Dysuria: Secondary | ICD-10-CM | POA: Insufficient documentation

## 2016-04-09 DIAGNOSIS — I110 Hypertensive heart disease with heart failure: Secondary | ICD-10-CM | POA: Insufficient documentation

## 2016-04-09 DIAGNOSIS — R262 Difficulty in walking, not elsewhere classified: Secondary | ICD-10-CM

## 2016-04-09 LAB — CBC
HEMATOCRIT: 35.9 % (ref 35.0–47.0)
Hemoglobin: 11.4 g/dL — ABNORMAL LOW (ref 12.0–16.0)
MCH: 29.6 pg (ref 26.0–34.0)
MCHC: 31.7 g/dL — ABNORMAL LOW (ref 32.0–36.0)
MCV: 93.3 fL (ref 80.0–100.0)
Platelets: 194 10*3/uL (ref 150–440)
RBC: 3.85 MIL/uL (ref 3.80–5.20)
RDW: 15.1 % — AB (ref 11.5–14.5)
WBC: 5.6 10*3/uL (ref 3.6–11.0)

## 2016-04-09 LAB — BASIC METABOLIC PANEL
Anion gap: 5 (ref 5–15)
BUN: 14 mg/dL (ref 6–20)
CHLORIDE: 104 mmol/L (ref 101–111)
CO2: 33 mmol/L — AB (ref 22–32)
CREATININE: 0.77 mg/dL (ref 0.44–1.00)
Calcium: 9.4 mg/dL (ref 8.9–10.3)
GFR calc Af Amer: >60 mL/min (ref >60)
GFR calc non Af Amer: >60 mL/min (ref >60)
GLUCOSE: 126 mg/dL — AB (ref 65–99)
POTASSIUM: 4.4 mmol/L (ref 3.5–5.1)
SODIUM: 142 mmol/L (ref 135–145)

## 2016-04-09 LAB — URINALYSIS, COMPLETE (UACMP) WITH MICROSCOPIC
BACTERIA UA: NONE SEEN
Bilirubin Urine: NEGATIVE
Glucose, UA: NEGATIVE mg/dL
HGB URINE DIPSTICK: NEGATIVE
KETONES UR: NEGATIVE mg/dL
LEUKOCYTES UA: NEGATIVE
NITRITE: NEGATIVE
PH: 5 (ref 5.0–8.0)
Protein, ur: NEGATIVE mg/dL
Specific Gravity, Urine: 1.023 (ref 1.005–1.030)

## 2016-04-09 LAB — INFLUENZA PANEL BY PCR (TYPE A & B)
INFLAPCR: NEGATIVE
Influenza B By PCR: NEGATIVE

## 2016-04-09 LAB — BRAIN NATRIURETIC PEPTIDE: B Natriuretic Peptide: 38 pg/mL (ref 0.0–100.0)

## 2016-04-09 LAB — TROPONIN I: Troponin I: <0.03 ng/mL (ref <0.03)

## 2016-04-09 MED ORDER — PREDNISONE 20 MG PO TABS
60.0000 mg | ORAL_TABLET | Freq: Every day | ORAL | Status: DC
  Administered 2016-04-10: 60 mg via ORAL
  Filled 2016-04-09: qty 3

## 2016-04-09 MED ORDER — PREGABALIN 75 MG PO CAPS
150.0000 mg | ORAL_CAPSULE | Freq: Two times a day (BID) | ORAL | Status: DC
  Administered 2016-04-09 – 2016-04-10 (×2): 150 mg via ORAL
  Filled 2016-04-09 (×2): qty 2

## 2016-04-09 MED ORDER — ACETAMINOPHEN 500 MG PO TABS
1000.0000 mg | ORAL_TABLET | Freq: Once | ORAL | Status: AC
  Administered 2016-04-09: 1000 mg via ORAL
  Filled 2016-04-09: qty 2

## 2016-04-09 MED ORDER — IPRATROPIUM-ALBUTEROL 0.5-2.5 (3) MG/3ML IN SOLN
3.0000 mL | Freq: Four times a day (QID) | RESPIRATORY_TRACT | Status: DC
  Administered 2016-04-09 – 2016-04-10 (×3): 3 mL via RESPIRATORY_TRACT
  Filled 2016-04-09 (×3): qty 3

## 2016-04-09 MED ORDER — MAGNESIUM OXIDE 400 (241.3 MG) MG PO TABS
400.0000 mg | ORAL_TABLET | Freq: Every day | ORAL | Status: DC
  Administered 2016-04-10: 400 mg via ORAL
  Filled 2016-04-09: qty 1

## 2016-04-09 MED ORDER — ACETAMINOPHEN 500 MG PO TABS
1000.0000 mg | ORAL_TABLET | Freq: Three times a day (TID) | ORAL | Status: DC
  Filled 2016-04-09: qty 2

## 2016-04-09 MED ORDER — PROPRANOLOL HCL ER 80 MG PO CP24
80.0000 mg | ORAL_CAPSULE | Freq: Every day | ORAL | Status: DC
  Administered 2016-04-10: 80 mg via ORAL
  Filled 2016-04-09: qty 1

## 2016-04-09 MED ORDER — METHYLPREDNISOLONE SODIUM SUCC 125 MG IJ SOLR
125.0000 mg | Freq: Once | INTRAMUSCULAR | Status: AC
  Administered 2016-04-09: 125 mg via INTRAVENOUS
  Filled 2016-04-09: qty 2

## 2016-04-09 MED ORDER — FAMOTIDINE 20 MG PO TABS
10.0000 mg | ORAL_TABLET | Freq: Every day | ORAL | Status: DC
  Filled 2016-04-09: qty 2

## 2016-04-09 MED ORDER — FUROSEMIDE 40 MG PO TABS
40.0000 mg | ORAL_TABLET | Freq: Two times a day (BID) | ORAL | Status: DC
Start: 2016-04-10 — End: 2016-04-11
  Administered 2016-04-10 (×2): 40 mg via ORAL
  Filled 2016-04-09 (×2): qty 1

## 2016-04-09 MED ORDER — FLUTICASONE PROPIONATE 50 MCG/ACT NA SUSP: 2.0000 | Freq: Every day | NASAL | Status: DC

## 2016-04-09 MED ORDER — UMECLIDINIUM-VILANTEROL 62.5-25 MCG/INH IN AEPB: 1.0000 | INHALATION_SPRAY | Freq: Every day | RESPIRATORY_TRACT | Status: DC

## 2016-04-09 MED ORDER — OXYCODONE HCL 5 MG PO TABS
5.0000 mg | ORAL_TABLET | Freq: Once | ORAL | Status: AC
  Administered 2016-04-09: 5 mg via ORAL
  Filled 2016-04-09: qty 1

## 2016-04-09 MED ORDER — ROPINIROLE HCL 0.25 MG PO TABS
0.7500 mg | ORAL_TABLET | Freq: Every day | ORAL | Status: DC
  Administered 2016-04-10: 02:00:00 0.75 mg via ORAL
  Filled 2016-04-09 (×2): qty 1

## 2016-04-09 MED ORDER — AMOXICILLIN-POT CLAVULANATE 875-125 MG PO TABS
1.0000 | ORAL_TABLET | Freq: Two times a day (BID) | ORAL | Status: DC
  Administered 2016-04-10 (×2): 1 via ORAL
  Filled 2016-04-09 (×3): qty 1

## 2016-04-09 MED ORDER — PANTOPRAZOLE SODIUM 40 MG PO TBEC
40.0000 mg | DELAYED_RELEASE_TABLET | Freq: Every day | ORAL | Status: DC
  Administered 2016-04-09 – 2016-04-10 (×2): 40 mg via ORAL
  Filled 2016-04-09 (×2): qty 1

## 2016-04-09 MED ORDER — COLESEVELAM HCL 625 MG PO TABS
625.0000 mg | ORAL_TABLET | Freq: Every day | ORAL | Status: DC
  Administered 2016-04-10: 625 mg via ORAL
  Filled 2016-04-09: qty 1

## 2016-04-09 MED ORDER — ALBUTEROL SULFATE (2.5 MG/3ML) 0.083% IN NEBU
5.0000 mg | INHALATION_SOLUTION | RESPIRATORY_TRACT | Status: DC | PRN
  Administered 2016-04-09: 5 mg via RESPIRATORY_TRACT
  Filled 2016-04-09: qty 6

## 2016-04-09 MED ORDER — IPRATROPIUM-ALBUTEROL 0.5-2.5 (3) MG/3ML IN SOLN
3.0000 mL | Freq: Once | RESPIRATORY_TRACT | Status: AC
  Administered 2016-04-09: 3 mL via RESPIRATORY_TRACT
  Filled 2016-04-09: qty 3

## 2016-04-09 MED ORDER — CITALOPRAM HYDROBROMIDE 20 MG PO TABS
20.0000 mg | ORAL_TABLET | Freq: Every day | ORAL | Status: DC
  Administered 2016-04-10: 20 mg via ORAL
  Filled 2016-04-09: qty 1

## 2016-04-09 NOTE — ED Provider Notes (Signed)
[facility] Emergency Department Provider Note  ____________________________________________  Time seen: Approximately 6:41 PM  I have reviewed the triage vital signs and the nursing notes.   HISTORY  Chief Complaint Shortness of Breath; Chest Pain; and Knee Pain   HPI Loretta Wade is a 66 y.o. female a history of CHF, COPD on 2 L nasal cannula, sleep apnea not on CPAP, hypertension, and fibromyalgia who presents with multiple medical complaints.Patient is complaining of 2 weeks of nausea, diarrhea, body aches, generalized weakness, cough productive of brown sputum, wheezing, and progressively worsening shortness of breath. For the last few days reports subjective fever. No increased oxygen requirement. She also noticed weight gain and swelling of her lower extremities. Patient also reports that today she was getting up to go to the bathroom when she heard a pop in her left knee and her leg gave out. She sat on the couch and when she tried walk again she had the same pain in her knee and was unable to ambulate. She complains of pain only when she bears weight located in her left knee nonradiating and severe intensity. None at rest. Patient is also complaining of dysuria for a few days, no abdominal pain or flank pain. Patient denies chest pain.  Past Medical History:  Diagnosis Date  . Anemia   . CHF (congestive heart failure) ([place])   . COPD (chronic obstructive pulmonary disease) (HCC)    on 2L home o2  . Edema   . Esophageal reflux   . Fatigue   . Fibromyalgia    Following with pain management  . Hypertension   . Nocturia   . OA (osteoarthritis)   . Sleep apnea    not on CPAP    Patient Active Problem List   Diagnosis Date Noted  . Candidal intertrigo 02/08/2016  . Dysuria 02/08/2016  . Requires assistance with activities of daily living (ADL) 02/08/2016  . Oral thrush 11/10/2015  . Tachypnea 11/01/2015  . Acute non-recurrent maxillary  sinusitis 10/29/2015  . Fall 07/11/2015  . Dyslipidemia 06/17/2015  . Overflow stress incontinence of urine in female 06/17/2015  . Chronic pain 06/10/2015  . Chronic low back pain (Location of Tertiary source of pain) (Bilateral) (R>L) 06/10/2015  . Chronic shoulder pain (Location of Secondary source of pain) (Left) 06/10/2015  . Costochondritis (Location of Primary Source of Pain) 06/10/2015  . Long term current use of opiate analgesic 06/10/2015  . Long term prescription opiate use 06/10/2015  . Opiate use 06/10/2015  . Encounter for therapeutic drug level monitoring 06/10/2015  . Encounter for pain management planning 06/10/2015  . Chronic feet pain (Bilateral) (R>L) 06/10/2015  . Osteoarthritis 06/10/2015  . Osteoarthritis of shoulder (Left) 06/10/2015  . COPD exacerbation ([place]) 04/12/2015  . Poor mobility 04/12/2015  . Fibromyalgia 03/23/2015  . GERD (gastroesophageal reflux disease) 03/23/2015  . MRSA infection 11/17/2014  . Diastolic CHF, acute on chronic (HCC) 11/12/2014  . Morbid obesity with BMI of 50.0-59.9, adult ([place]) 11/12/2014  . Neck pain, chronic 11/12/2014  . COPD (chronic obstructive pulmonary disease) ([place]) 11/04/2014  . Urinary tract infection 10/02/2014    Past Surgical History:  Procedure Laterality Date  . ABDOMINAL HYSTERECTOMY    . CHOLECYSTECTOMY    . TOTAL KNEE ARTHROPLASTY     right  . VESICOVAGINAL FISTULA CLOSURE W/ TAH      Prior to Admission medications   Medication Sig Start Date End Date Taking? Authorizing Provider  acetaminophen (TYLENOL) 325 MG tablet Take  2 tablets (650 mg total) by mouth every 6 (six) hours as needed for mild pain (or Fever >/= 101). 11/17/14   Gladstone Lighter, MD  albuterol (PROVENTIL) (2.5 MG/3ML) 0.083% nebulizer solution USE ONE TREATMENT EVERY 6 HOURS AS NEEDED FOR WHEEZING OR SHORTNESS OF BREATH 09/20/15   Roselee Nova, MD  ALPRAZolam Duanne Moron) 0.5 MG tablet Take 1 tablet (0.5 mg total) by mouth daily as needed  for anxiety. 11/30/15   Roselee Nova, MD  amoxicillin-clavulanate (AUGMENTIN) 875-125 MG tablet Take 1 tablet by mouth 2 (two) times daily. 02/16/16   Roselee Nova, MD  ANORO ELLIPTA 62.5-25 MCG/INH AEPB Inhale 1 puff into the lungs daily. 10/06/15   Historical Provider, MD  Chlorphen-Phenyleph-APAP (CORICIDIN D COLD/FLU/SINUS) 2-5-325 MG TABS Take 2 tablets by mouth daily as needed (for nasal congestion/cough).    Historical Provider, MD  chlorpheniramine-HYDROcodone (TUSSIONEX) 10-8 MG/5ML SUER Take 5 mLs by mouth every 12 (twelve) hours as needed for cough. 11/04/15   Demetrios Loll, MD  cholecalciferol (VITAMIN D) 1000 UNITS tablet Take 1,000 Units by mouth daily.     Historical Provider, MD  citalopram (CELEXA) 20 MG tablet TAKE ONE (1) TABLET BY MOUTH EVERY DAY 02/17/16   Roselee Nova, MD  DEXILANT 60 MG capsule TAKE ONE (1) CAPSULE EACH DAY 11/10/15   Roselee Nova, MD  fluticasone (FLONASE) 50 MCG/ACT nasal spray Place 2 sprays into both nostrils daily. 03/07/16 04/06/16  Roselee Nova, MD  furosemide (LASIX) 40 MG tablet Take 1 tablet (40 mg total) by mouth 2 (two) times daily. 02/08/15   Dustin Flock, MD  guaiFENesin-dextromethorphan (ROBITUSSIN DM) 100-10 MG/5ML syrup Take 5-10 mLs by mouth every 4 (four) hours as needed for cough.     Historical Provider, MD  HYDROcodone-acetaminophen (NORCO/VICODIN) 5-325 MG tablet Take 1 tablet by mouth every 6 (six) hours as needed for moderate pain. 03/24/16   Roselee Nova, MD  loperamide (IMODIUM) 2 MG capsule Take 2-4 mg by mouth as needed for diarrhea or loose stools.    Historical Provider, MD  Magnesium 250 MG TABS Take 250 mg by mouth daily.    Historical Provider, MD  nystatin (MYCOSTATIN) 100000 UNIT/ML suspension Take 5 mLs (500,000 Units total) by mouth 4 (four) times daily. 11/10/15   Roselee Nova, MD  nystatin ointment (MYCOSTATIN) Apply 1 application topically 3 (three) times daily. 02/25/16   Steele Sizer, MD  oxybutynin  (DITROPAN) 5 MG tablet TAKE ONE (1) TABLET BY MOUTH EVERY DAY 02/25/16   Roselee Nova, MD  pantoprazole (PROTONIX) 40 MG tablet TAKE ONE (1) TABLET BY MOUTH TWO (2) TIMES DAILY 01/14/16   Roselee Nova, MD  predniSONE (DELTASONE) 10 MG tablet 20 mg po daily for 2 days, 10 mg po daily for 2 days. 11/04/15   Demetrios Loll, MD  pregabalin (LYRICA) 150 MG capsule Take 1 capsule (150 mg total) by mouth 2 (two) times daily. 02/08/16   Roselee Nova, MD  propranolol ER (INDERAL LA) 80 MG 24 hr capsule TAKE ONE CAPSULE BY MOUTH DAILY 03/09/16   Roselee Nova, MD  ranitidine (ZANTAC) 150 MG tablet TAKE ONE TABLET TWICE DAILY 01/14/16   Roselee Nova, MD  rOPINIRole (REQUIP) 0.25 MG tablet Take 3 tablets (0.75 mg total) by mouth at bedtime. 01/26/15   Roselee Nova, MD  rOPINIRole (REQUIP) 0.25 MG tablet TAKE THREE TABLETS BY MOUTH  EVERY NIGHT AT BEDTIME 11/15/15   Roselee Nova, MD  [facility] 625 MG tablet TAKE ONE TABLET TWICE A DAY WITH FOOD 03/01/16   Roselee Nova, MD    Allergies Patient has no known allergies.  Family History  Problem Relation Age of Onset  . Hypertension Mother   . Diabetes    . Breast cancer Daughter     Social History Social History  Substance Use Topics  . Smoking status: Former Smoker    Quit date: 11/04/1983  . Smokeless tobacco: Never Used     Comment: quit several years ago - almost 30 years ago  . Alcohol use No    Review of Systems  Constitutional: + fever, chills, fatigue, body aches Eyes: Negative for visual changes. ENT: Negative for sore throat. Neck: No neck pain  Cardiovascular: Negative for chest pain. Respiratory: + shortness of breath, wheezing, cough Gastrointestinal: Negative for abdominal pain, vomiting or diarrhea. Genitourinary: + dysuria. Musculoskeletal: Negative for back pain. + left knee pain Skin: Negative for rash. Neurological: Negative for headaches, weakness or numbness. Psych: No SI or  HI  ____________________________________________   PHYSICAL EXAM:  VITAL SIGNS: ED Triage Vitals  Enc Vitals Group     BP 04/09/16 1823 (!) 121/55     Pulse -- 61     Resp 04/09/16 1823 (!) 24     Temp 04/09/16 1823 98.9 F (37.2 C)     Temp Source 04/09/16 1823 Oral     SpO2 04/09/16 1823 99 %     Weight 04/09/16 1823 (!) 350 lb (158.8 kg)     Height 04/09/16 1823 5\' 4"  (1.626 m)     Head Circumference --      Peak Flow --      Pain Score 04/09/16 1824 9     Pain Loc --      Pain Edu? --      Excl. in [place]? --     Constitutional: Alert and oriented, mild increased WOB. HEENT:      Head: Normocephalic and atraumatic.         Eyes: Conjunctivae are normal. Sclera is non-icteric. EOMI. PERRL      Mouth/Throat: Mucous membranes are moist.       Neck: Supple with no signs of meningismus. Cardiovascular: Regular rate and rhythm. No murmurs, gallops, or rubs. 2+ symmetrical distal pulses are present in all extremities. No JVD. Respiratory: Mild increased work of breathing, speaking full sentences, [place] and the mid 20s, satting 99% on 2 L nasal cannula which is patient's baseline, diffuse expiratory wheezes and decreased air movement bilaterally  Gastrointestinal: Soft, non tender, and non distended with positive bowel sounds. No rebound or guarding. Musculoskeletal: Tenderness to palpation over the mediolateral aspect of the left knee, no deformities, edema, or bruising Neurologic: Normal speech and language. Face is symmetric. Moving all extremities. No gross focal neurologic deficits are appreciated. Skin: Skin is warm, dry and intact. No rash noted. Psychiatric: Mood and affect are normal. Speech and behavior are normal.  ____________________________________________   LABS (all labs ordered are listed, but only abnormal results are displayed)  Labs Reviewed  BASIC METABOLIC PANEL - Abnormal; Notable for the following:       Result Value   CO2 33 (*)    Glucose, Bld  126 (*)    All other components within normal limits  CBC - Abnormal; Notable for the following:    Hemoglobin 11.4 (*)    MCHC 31.7 (*)  RDW 15.1 (*)    All other components within normal limits  URINALYSIS, COMPLETE (UACMP) WITH MICROSCOPIC - Abnormal; Notable for the following:    Color, Urine YELLOW (*)    APPearance CLEAR (*)    Squamous Epithelial / LPF 0-5 (*)    All other components within normal limits  TROPONIN I  BRAIN NATRIURETIC PEPTIDE  INFLUENZA PANEL BY PCR (TYPE A & B)   ____________________________________________  EKG  ED ECG REPORT I, Rudene Re, the attending physician, personally viewed and interpreted this ECG.  Sinus rhythm, rate of 60, normal intervals, normal axis, no ST elevations or depressions. ____________________________________________  RADIOLOGY  CXR: Negative ____________________________________________   PROCEDURES  Procedure(s) performed: None Procedures Critical Care performed:  None ____________________________________________   INITIAL IMPRESSION / ASSESSMENT AND PLAN / ED COURSE   66 y.o. female a history of CHF, COPD on 2 L nasal cannula, sleep apnea not on CPAP, hypertension, and fibromyalgia who presents with multiple medical complaints.  # flulike sx: We'll check for flu, chest x-ray to rule out pneumonia versus pulmonary edema. Patient is wheezing with decreased air movement and mild increased work of breathing although sats are normal at baseline oxygen requirement. We'll give DuoNeb 3, Solu-Medrol. We'll check BNP.  # knee pain: normal ROM, patient has ttp over the lateral and medial aspect of the L knee, no bruising, erythema or warmth. Will get XR  # Dysuria: No abdominal pain or flank pain. We'll check UA.    Clinical Course as of Apr 09 2314  Nancy Fetter Apr 09, 2016  2004 Patient received 3 DuoNeb treatments and is now moving great air with no wheezing, normal work of breathing. BNP is within normal limits,  troponin is negative, chest x-ray with no edema or pneumonia. Blood work within normal limits with normal WBCs. X-ray of her knee with no acute findings. We'll get ambulatory sats and make sure patient can weight bear after PO pain meds and DC home.  [CV]  2143 Patient reports that she is unable to ambulate because her knee continues to give out. Attempt to ambulate with a walker.  Patient has asked the nurse multiple times that she wanted to speak with someone above me to allow her to be admitted as she does not want to go home.I explained to her that she does not meet criteria for inpatient admission. Will consult PT in the morning to see if patient qualifies for placement. Home meds ordered.  [CV]    Clinical Course User Index [CV] Rudene Re, MD    Pertinent labs & imaging results that were available during my care of the patient were reviewed by me and considered in my medical decision making (see chart for details).    ____________________________________________   FINAL CLINICAL IMPRESSION(S) / ED DIAGNOSES  Final diagnoses:  Left knee pain  COPD exacerbation (HCC)      NEW MEDICATIONS STARTED DURING THIS VISIT:  New Prescriptions   No medications on file     Note:  This document was prepared using Dragon voice recognition software and may include unintentional dictation errors.    Rudene Re, MD 04/09/16 [phone]

## 2016-04-09 NOTE — ED Notes (Signed)
Pharmacy called for pt's Requip medication

## 2016-04-09 NOTE — ED Notes (Signed)
Lea, CN is aware that pt is here waiting for PT consult. Pt is aware that she will be waiting for PT to come and see her to evaluate for possible rehabilitation.

## 2016-04-09 NOTE — ED Triage Notes (Signed)
Pt has had flu sx for about 2 weeks with nausea, diarrhea, and weakness. Pt stating that she began with CP and SOB about 2 days ago. Pt stating that her sx are similar when her CHF is bad. Pt also stating burning with urination. Pt on 2L [place] at this time and wears the same at home. Pt is on cardiac monitor and pulse o2. Pt in NAD at this time.

## 2016-04-09 NOTE — ED Notes (Signed)
Assisted pt with ambulating in room with RN Charleen Kirks. Pt's saturation maintained above 93% on 2L. Pt stated she could not walk any farther due to her left knee pain.

## 2016-04-09 NOTE — ED Notes (Signed)
Pt in NAD. Pt stating that the breathing tx have helped "a little."

## 2016-04-09 NOTE — ED Triage Notes (Signed)
Pt also stating left knee pain that started today. Pt stood to go to the restroom and heard a "pop." No edema to knee noted. Pt does have +2 pitting edema in BLE and generalized edema

## 2016-04-10 ENCOUNTER — Encounter: Payer: Self-pay | Admitting: Family Medicine

## 2016-04-10 ENCOUNTER — Emergency Department: Payer: Medicare PPO

## 2016-04-10 MED ORDER — IOPAMIDOL (ISOVUE-300) INJECTION 61%
30.0000 mL | Freq: Once | INTRAVENOUS | Status: AC | PRN
  Administered 2016-04-10: 30 mL via ORAL

## 2016-04-10 MED ORDER — IOPAMIDOL (ISOVUE-300) INJECTION 61%
125.0000 mL | Freq: Once | INTRAVENOUS | Status: AC | PRN
  Administered 2016-04-10: 125 mL via INTRAVENOUS

## 2016-04-10 MED ORDER — HYDROCODONE-ACETAMINOPHEN 5-325 MG PO TABS
1.0000 | ORAL_TABLET | Freq: Four times a day (QID) | ORAL | Status: DC | PRN
  Administered 2016-04-10: 1 via ORAL
  Filled 2016-04-10: qty 1

## 2016-04-10 MED ORDER — ROPINIROLE HCL 0.25 MG PO TABS
0.7500 mg | ORAL_TABLET | Freq: Every day | ORAL | Status: DC
  Filled 2016-04-10: qty 3

## 2016-04-10 NOTE — ED Notes (Signed)
CSW received a call from EDP. Pt is requesting to go to [facility] and has a preference for [facility]. CSW will fill out FL2 and send referrals for pt. CSW will monitor the hub and present bed offers to pt as they become available.   Georga Kaufmann, MSW, [place]

## 2016-04-10 NOTE — Care Management Note (Signed)
Case Management Note  Patient Details  Name: Loretta Wade MRN: [id number] Date of Birth: 05-21-1950  Subjective/Objective:   I have enlisted help from the UR rep. For the patient , to see if there is a way to contact the Nurse from [facility] who sees this patient at home. Perhaps they can shed light on some other resources that might be available for the patient. Awaiting callback                  Action/Plan:   Expected Discharge Date:                  Expected Discharge Plan:     In-House Referral:     Discharge planning Services     Post Acute Care Choice:    Choice offered to:     DME Arranged:    DME Agency:     HH Arranged:    HH Agency:     Status of Service:     If discussed at [employer] of Stay Meetings, dates discussed:    Additional Comments:  Beau Fanny, RN 04/10/2016, 11:42 AM

## 2016-04-10 NOTE — ED Notes (Signed)
Received report from [place], care assumed.  Pt resting in bed awaiting PT consult.

## 2016-04-10 NOTE — ED Provider Notes (Signed)
Patient seen by PT and OT and they recommended rehabilitation. Social worker is attempting to find an open bed. This was delayed by the patient complaining of abdominal pain and refusing to leave the hospital until was evaluated. Her exam was quite reassuring and CT confirms no acute abnormalities.   Lavonia Drafts, MD 04/10/16 [phone]

## 2016-04-10 NOTE — ED Notes (Signed)
Per Nursing Supervisor/admin Ann, to do follow on outpatient rehab/PT services in the morning. Pt agreed to discharges

## 2016-04-10 NOTE — ED Notes (Signed)
Pt was assisted to the bathroom via bedpan by this tech, this tech changed all bed linen and repositioned pt. Pt comfortable at this time

## 2016-04-10 NOTE — ED Provider Notes (Signed)
Patient has been declined by [facility] she's been except by [facility] health care but does not want to go there she says that there asked the place. I explained to her that she does not meet criteria for admission she says she doesn't understand that she is never not met criteria for admission even when she felt better than she does now. She was on the phone with what sounded like [employer] who told her apparently that there was no reason why she couldn't stay in the hospital. This directly contradicts the note that [facility] told us course was obviously different office that she couldn't stay in the hospital or that they wouldn't pay for it apparently the one office told her that we never spoke to them. I called the hospital administrator who is going to try to reach out to [facility] and find out what to do. We will leave here here in the ER for the meantime.   Nena Polio, MD 04/10/16 [phone]

## 2016-04-10 NOTE — ED Notes (Addendum)
Notified pharmacy of missed daily medications. Pharmacy to verify meds and send to unit. CT abdomen results WNL.

## 2016-04-10 NOTE — ED Notes (Signed)
Assumed care from [place]

## 2016-04-10 NOTE — ED Notes (Addendum)
Pt woke to use bedpan; breathing treatment started; pt had been sleeping soundly; would like Requip at this time; pharmacy called for medicine; pt also reports pain "all over" and would like Norco which she is prescribed at home; spoke with Dr Dahlia Client who gave verbal order for same

## 2016-04-10 NOTE — ED Notes (Signed)
Pt has been declined by [employer] and [facility]. Pt has been accepted at [facility] center. CSW engaged with pt at pt's bedside and informed pt of bed offer. Pt states that she is only willing to go to [facility] and would not like to go to [facility]. Pt again expressed frustration about not being admitted to the hospital. CSW stated that pt's tests have come back normal and that pt does not meet criteria for admission at this time. Per RNCM, pt is connected to [facility] PT and has services available for her at home. RNCM also spoke with a [employer] rep who stated that a [facility] stay would likely not be covered by insurance for the pt. Pt is currently not agreeable to returning home. EDP informed.  Loretta Wade, MSW, [place]

## 2016-04-10 NOTE — ED Notes (Signed)
Pt was helped onto a bedpan by Colletta [place], EDT and Kirke Shaggy, Therapist, sports. Pt tolerated well.

## 2016-04-10 NOTE — NC FL2 (Signed)
[place] LEVEL OF CARE SCREENING TOOL     IDENTIFICATION  Patient Name: Loretta Wade Birthdate: [birth year] Sex: female Admission Date (Current Location): 04/09/2016  [place] and [place] Number:  Engineering geologist and Address:  [facility], [address], [place], [place] [address]      Provider Number: [id number]  Attending Physician Name and Address:  Lavonia Drafts, MD  Relative Name and Phone Number:       Current Level of Care: Hospital Recommended Level of Care: [place] Prior Approval Number:    Date Approved/Denied: 04/10/16 PASRR Number: [id number]  Discharge Plan: SNF    Current Diagnoses: Patient Active Problem List   Diagnosis Date Noted  . Candidal intertrigo 02/08/2016  . Dysuria 02/08/2016  . Requires assistance with activities of daily living (ADL) 02/08/2016  . Oral thrush 11/10/2015  . Tachypnea 11/01/2015  . Acute non-recurrent maxillary sinusitis 10/29/2015  . Fall 07/11/2015  . Dyslipidemia 06/17/2015  . Overflow stress incontinence of urine in female 06/17/2015  . Chronic pain 06/10/2015  . Chronic low back pain (Location of Tertiary source of pain) (Bilateral) (R>L) 06/10/2015  . Chronic shoulder pain (Location of Secondary source of pain) (Left) 06/10/2015  . Costochondritis (Location of Primary Source of Pain) 06/10/2015  . Long term current use of opiate analgesic 06/10/2015  . Long term prescription opiate use 06/10/2015  . Opiate use 06/10/2015  . Encounter for therapeutic drug level monitoring 06/10/2015  . Encounter for pain management planning 06/10/2015  . Chronic feet pain (Bilateral) (R>L) 06/10/2015  . Osteoarthritis 06/10/2015  . Osteoarthritis of shoulder (Left) 06/10/2015  . COPD exacerbation ([place]) 04/12/2015  . Poor mobility 04/12/2015  . Fibromyalgia 03/23/2015  . GERD (gastroesophageal reflux disease) 03/23/2015  . MRSA infection 11/17/2014  . Diastolic  CHF, acute on chronic (HCC) 11/12/2014  . Morbid obesity with BMI of 50.0-59.9, adult ([place]) 11/12/2014  . Neck pain, chronic 11/12/2014  . COPD (chronic obstructive pulmonary disease) ([place]) 11/04/2014  . Urinary tract infection 10/02/2014    Orientation RESPIRATION BLADDER Height & Weight     Self, Time, Situation, Place  O2 (Nasal Cannula for congestion problems) Continent Weight: (!) 350 lb (158.8 kg) Height:  5\' 4"  (162.6 cm)  BEHAVIORAL SYMPTOMS/MOOD NEUROLOGICAL BOWEL NUTRITION STATUS      Continent Diet (Diet regular Room service appropriate? Yes; Fluid consistency: Thin)  AMBULATORY STATUS COMMUNICATION OF NEEDS Skin   Extensive Assist Verbally Normal                       Personal Care Assistance Level of Assistance  Bathing, Feeding, Dressing Bathing Assistance: Limited assistance Feeding assistance: Independent Dressing Assistance: Limited assistance     Functional Limitations Info  Sight, Hearing, Speech Sight Info: Adequate Hearing Info: Adequate Speech Info: Adequate    SPECIAL CARE FACTORS FREQUENCY  PT (By licensed PT), OT (By licensed OT)     PT Frequency: 5x OT Frequency: 5x            Contractures Contractures Info: Not present    Additional Factors Info  Code Status, Allergies Code Status Info: Full Code Allergies Info: No known allergies            Current Medications (04/10/2016):  This is the current hospital active medication list Current Facility-Administered Medications  Medication Dose Route Frequency Provider Last Rate Last Dose  . acetaminophen (TYLENOL) tablet 1,000 mg  1,000 mg Oral TID Rudene Re, MD      .  albuterol (PROVENTIL) (2.5 MG/3ML) 0.083% nebulizer solution 5 mg  5 mg Nebulization Q4H PRN Rudene Re, MD   5 mg at 04/09/16 2200  . amoxicillin-clavulanate (AUGMENTIN) 875-125 MG per tablet 1 tablet  1 tablet Oral BID Rudene Re, MD   1 tablet at 04/10/16 1405  . citalopram (CELEXA) tablet 20 mg   20 mg Oral Daily Rudene Re, MD   20 mg at 04/10/16 1406  . colesevelam [facility]) tablet 625 mg  625 mg Oral Q breakfast Rudene Re, MD   625 mg at 04/10/16 1403  . famotidine (PEPCID) tablet 10 mg  10 mg Oral Daily Rudene Re, MD      . fluticasone [facility]) 50 MCG/ACT nasal spray 2 spray  2 spray Each Nare Daily Rudene Re, MD      . furosemide (LASIX) tablet 40 mg  40 mg Oral BID Rudene Re, MD   40 mg at 04/10/16 1404  . HYDROcodone-acetaminophen (NORCO/VICODIN) 5-325 MG per tablet 1 tablet  1 tablet Oral Q6H PRN Loney Hering, MD   1 tablet at 04/10/16 0221  . ipratropium-albuterol (DUONEB) 0.5-2.5 (3) MG/3ML nebulizer solution 3 mL  3 mL Nebulization Q6H Rudene Re, MD   3 mL at 04/10/16 0201  . magnesium oxide (MAG-OX) tablet 400 mg  400 mg Oral Daily Rudene Re, MD   400 mg at 04/10/16 1404  . pantoprazole (PROTONIX) EC tablet 40 mg  40 mg Oral Daily Rudene Re, MD   40 mg at 04/10/16 1411  . predniSONE (DELTASONE) tablet 60 mg  60 mg Oral Daily Rudene Re, MD   60 mg at 04/10/16 1403  . pregabalin (LYRICA) capsule 150 mg  150 mg Oral BID Rudene Re, MD   150 mg at 04/10/16 1412  . propranolol ER (INDERAL LA) 24 hr capsule 80 mg  80 mg Oral Daily Rudene Re, MD   80 mg at 04/10/16 1407  . rOPINIRole (REQUIP) tablet 0.75 mg  0.75 mg Oral QHS Rudene Re, MD      . umeclidinium-vilanterol [facility] ELLIPTA) 62.5-25 MCG/INH 1 puff  1 puff Inhalation Daily Rudene Re, MD       Current Outpatient Prescriptions  Medication Sig Dispense Refill  . acetaminophen (TYLENOL) 325 MG tablet Take 2 tablets (650 mg total) by mouth every 6 (six) hours as needed for mild pain (or Fever >/= 101). 30 tablet 0  . albuterol (PROVENTIL) (2.5 MG/3ML) 0.083% nebulizer solution USE ONE TREATMENT EVERY 6 HOURS AS NEEDED FOR WHEEZING OR SHORTNESS OF BREATH 360 mL 3  . ALPRAZolam (XANAX) 0.5 MG tablet Take 1 tablet (0.5 mg  total) by mouth daily as needed for anxiety. 30 tablet 0  . amoxicillin-clavulanate (AUGMENTIN) 875-125 MG tablet Take 1 tablet by mouth 2 (two) times daily. 20 tablet 0  . ANORO ELLIPTA 62.5-25 MCG/INH AEPB Inhale 1 puff into the lungs daily.    . Chlorphen-Phenyleph-APAP (CORICIDIN D COLD/FLU/SINUS) 2-5-325 MG TABS Take 2 tablets by mouth daily as needed (for nasal congestion/cough).    . chlorpheniramine-HYDROcodone (TUSSIONEX) 10-8 MG/5ML SUER Take 5 mLs by mouth every 12 (twelve) hours as needed for cough. 115 mL 0  . cholecalciferol (VITAMIN D) 1000 UNITS tablet Take 1,000 Units by mouth daily.     . citalopram (CELEXA) 20 MG tablet TAKE ONE (1) TABLET BY MOUTH EVERY DAY 90 tablet 1  . DEXILANT 60 MG capsule TAKE ONE (1) CAPSULE EACH DAY 90 capsule 1  . fluticasone (FLONASE) 50 MCG/ACT nasal spray Place 2  sprays into both nostrils daily. 16 g 2  . furosemide (LASIX) 40 MG tablet Take 1 tablet (40 mg total) by mouth 2 (two) times daily. 30 tablet 60  . guaiFENesin-dextromethorphan (ROBITUSSIN DM) 100-10 MG/5ML syrup Take 5-10 mLs by mouth every 4 (four) hours as needed for cough.     Marland Kitchen HYDROcodone-acetaminophen (NORCO/VICODIN) 5-325 MG tablet Take 1 tablet by mouth every 6 (six) hours as needed for moderate pain. 120 tablet 0  . loperamide (IMODIUM) 2 MG capsule Take 2-4 mg by mouth as needed for diarrhea or loose stools.    . Magnesium 250 MG TABS Take 250 mg by mouth daily.    Marland Kitchen nystatin (MYCOSTATIN) 100000 UNIT/ML suspension Take 5 mLs (500,000 Units total) by mouth 4 (four) times daily. 180 mL 0  . nystatin ointment (MYCOSTATIN) Apply 1 application topically 3 (three) times daily. 60 g 0  . oxybutynin (DITROPAN) 5 MG tablet TAKE ONE (1) TABLET BY MOUTH EVERY DAY 90 tablet 0  . pantoprazole (PROTONIX) 40 MG tablet TAKE ONE (1) TABLET BY MOUTH TWO (2) TIMES DAILY 60 tablet 2  . predniSONE (DELTASONE) 10 MG tablet 20 mg po daily for 2 days, 10 mg po daily for 2 days. 6 tablet 0  . pregabalin  (LYRICA) 150 MG capsule Take 1 capsule (150 mg total) by mouth 2 (two) times daily. 60 capsule 2  . propranolol ER (INDERAL LA) 80 MG 24 hr capsule TAKE ONE CAPSULE BY MOUTH DAILY 30 capsule 2  . ranitidine (ZANTAC) 150 MG tablet TAKE ONE TABLET TWICE DAILY 180 tablet 1  . rOPINIRole (REQUIP) 0.25 MG tablet Take 3 tablets (0.75 mg total) by mouth at bedtime. 90 tablet 2  . rOPINIRole (REQUIP) 0.25 MG tablet TAKE THREE TABLETS BY MOUTH EVERY NIGHT AT BEDTIME 270 tablet 0  . WELCHOL 625 MG tablet TAKE ONE TABLET TWICE A DAY WITH FOOD 60 tablet 2     Discharge Medications: Please see discharge summary for a list of discharge medications.  Relevant Imaging Results:  Relevant Lab Results:   Additional Information SSN [id number]  Georga Kaufmann, LCSWA

## 2016-04-10 NOTE — ED Notes (Signed)
Dr Corky Downs at bedside with pt.

## 2016-04-10 NOTE — Discharge Instructions (Signed)
Our intake nurse and social worker has spoken with Loretta Wade the supervisor for your home health person. Your health health person will call tomorrow to see if you're there and then discussed with you how your needs could be better cared for. The insurance company was spoken with about rehabilitation 2 and apparently from what I understand the insurance company did not feel he needed rehabilitation either.

## 2016-04-10 NOTE — Clinical Social Work Note (Signed)
Clinical Social Work Assessment  Patient Details  Name: Loretta Wade MRN: [id number] Date of Birth: 12/14/1950  Date of referral:  04/10/16               Reason for consult:  Facility Placement                Permission sought to share information with:  Family Supports, Chartered certified accountant granted to share information::  Yes, Verbal Permission Granted  Name::        Agency::     Relationship::     Contact Information:     Housing/Transportation Living arrangements for the past 2 months:  Single Family Home Source of Information:  Patient Patient Interpreter Needed:  None Criminal Activity/Legal Involvement Pertinent to Current Situation/Hospitalization:    Significant Relationships:  Adult Children Lives with:  Adult Children Do you feel safe going back to the place where you live?  Yes Need for family participation in patient care:  Yes (Comment)  Care giving concerns: No care giving concerns were identified at this time.   Social Worker assessment / plan: CSW received consult for facility placement. Pt is a 66 yo AA female who presented to the ED with chest and nasal congestion. Pt currently lives with her son and is able to ambulate with assistance. CSW engaged with pt at pt's bedside. CSW introduced herself and her role as Education officer, museum. RNCM was also engaged with pt during the time of the assessment. Pt explained that she is having pain around her breasts and up her back. RNCM explained that this pain could likely be from pt's chronic cough. However, pt stated that she believes the pain is not normal and would like a further work up, along with being admitted to the hospital. Pt's lab results have all been normal thus far. During the time of the assessment pt did not exhibit any signs of wheezing, coughing, shortness of breath, or excessive congestion. Pt mentioned that she would be interested in going to STR for her congestion, however, is not  interested in going to STR for her knee pain. CSW explained that congestion is not a rehabable need and would likely not be covered by insurance. Pt became agitated and insisted to speak with EDP. CSW agreed and EDP was informed.  Employment status:  Disabled (Comment on whether or not currently receiving Disability) Insurance information:  Programmer, applications, Medicaid In [place] PT Recommendations:  [place] / Referral to community resources:  [place]  Patient/Family's Response to care: Pt will d/c home with [facility] PT or will d/c to SNF private pay or Medicaid (long term).  Patient/Family's Understanding of and Emotional Response to Diagnosis, Current Treatment, and Prognosis: Pt is appreciative of the care provided by CSW at this time.  Emotional Assessment Appearance:    Attitude/Demeanor/Rapport:  Other (Cooperative) Affect (typically observed):  Agitated, Frustrated Orientation:  Oriented to Self, Oriented to Place, Oriented to  Time, Oriented to Situation Alcohol / Substance use:  Not Applicable Psych involvement (Current and /or in the community):  No (Comment)  Discharge Needs  Concerns to be addressed:  Care Coordination, Discharge Planning Concerns Readmission within the last 30 days:  No Current discharge risk:  Dependent with Mobility Barriers to Discharge:  Continued Medical Work up   [employer], [place] 04/10/2016, 10:48 AM

## 2016-04-10 NOTE — Care Management Note (Signed)
Case Management Note  Patient Details  Name: Loretta Wade MRN: [id number] Date of Birth: [birth year]  Subjective/Objective:        Spoke to patient after PT stopping by.      Patient says she lives with her son, and ambulates normally with a cane or walker. She just feels badly as far as her breathing and chest congestion. I explained to ehr the MD has determined there is nothing to bring her into the hospital for, and wonder what ability she has to pay out of pocket for rehab. She says she only has her [employer] and wants them to pay. She says she has been listening to the TV commercials that say if you do not feel well you should come to the hospital. I have stated to her that we agree, if feeling sick we are happy to see you, but it does not mean you should stay in the hospital unless you meant the criteria of needing some care that can only be given to you in the hospital. I have let her know that I contacted the CSW, who will see her to help determine what to do next.       Action/Plan:   Expected Discharge Date:                  Expected Discharge Plan:     In-House Referral:     Discharge planning Services     Post Acute Care Choice:    Choice offered to:     DME Arranged:    DME Agency:     HH Arranged:    HH Agency:     Status of Service:     If discussed at [employer] of Stay Meetings, dates discussed:    Additional Comments:  Beau Fanny, RN 04/10/2016, 9:40 AM

## 2016-04-10 NOTE — Evaluation (Signed)
Physical Therapy Evaluation Patient Details Name: Loretta Wade MRN: [id number] DOB: 1950-09-28 Today's Date: 04/10/2016   History of Present Illness  presented to ER secondary to 2 weeks history of weakness, malaise, SOB, as well as L knee pain (heard "pop" during mobility).  L knee imaging negative for acute injury/change.  Clinical Impression  Upon evaluation, patient alert and oriented to basic information; follows simple commands and demonstrates fair (but guarded) effort and participation with therapy evaluation.  Bilat UE/LE strength and ROM grossly symmetrical, but generally weak and deconditioned (3-/5).  Offers limited willingness/ability to provide resistance to L LE MMT secondary to knee pain (unable to localize to specific location).  Currently requiring mod assist for bed mobility; min assist +2 with RW for sit/stand, side-stepping edge of bed (2 steps x2 trials).  Very broad BOS, poor weight shifting; increased pain in L LE with advancement > WBing.  Requires seated rest periods intermittently throughout session due to fatigue/SOB/L knee pain; sats maintained >89% on 2L supplemental O2. Patient does appear to be slightly self-limiting in performance this session; functional performance somewhat inconsistent (questionable Waddell's test).  Very perseverative on pain and medical co-morbidities.  However, does not currently demonstrate ability to safely and independently negotiate home environment. Would benefit from skilled PT to address above deficits and promote optimal return to PLOF; recommend transition to STR upon discharge from acute hospitalization.     Follow Up Recommendations SNF    Equipment Recommendations       Recommendations for Other Services       Precautions / Restrictions Precautions Precautions: Fall Restrictions Weight Bearing Restrictions: No      Mobility  Bed Mobility Overal bed mobility: Needs Assistance Bed Mobility: Supine to Sit;Sit to  Supine     Supine to sit: Min assist;Mod assist Sit to supine: Mod assist   General bed mobility comments: assist for bilat LE elevation over edge of bed  Transfers Overall transfer level: Needs assistance Equipment used: Rolling walker (2 wheeled) Transfers: Sit to/from Stand Sit to Stand: Min guard;Min assist;+2 physical assistance         General transfer comment: pulls up on RW with bilat UEs  Ambulation/Gait Ambulation/Gait assistance: Min assist;+2 physical assistance Ambulation Distance (Feet): 2 Feet (x2) Assistive device: Rolling walker (2 wheeled)       General Gait Details: lateral stepping edge of bed, min assist +2; broad BOS.  difficulty with weight shift, reporting increased pain with L LE advancement > L LE stance. Unable/unsafe to attempt gait beyond edge of bed at this time due to pain/fall risk/SOB  Stairs            Wheelchair Mobility    Modified Rankin (Stroke Patients Only)       Balance Overall balance assessment: Needs assistance Sitting-balance support: No upper extremity supported;Feet supported Sitting balance-[name] Scale: Good     Standing balance support: Bilateral upper extremity supported Standing balance-[name] Scale: Fair                               Pertinent Vitals/Pain Pain Assessment: Faces Faces Pain Scale: Hurts even more Pain Location: L knee Pain Descriptors / Indicators: Aching Pain Intervention(s): Limited activity within patient's tolerance;Monitored during session;Repositioned    Home Living Family/patient expects to be discharged to:: Private residence Living Arrangements: Children (son and daughter-in-law) Available Help at Discharge: Family Type of Home: House Home Access: Level entry     Home  Layout: One level Home Equipment: Walker - 2 wheels;Hospital bed;Wheelchair - [employer];Toilet riser      Prior Function Level of Independence: Needs assistance          Comments: Ambulates for household distances with RW or SPC; intermittent use of WC as needed for longer distances (per previous notes).  Sponge bathes at sink for ADLs and has assist from family/aide (3 days/week) for additional ADLs and household chores.  Home O2 at 2L     Hand Dominance        Extremity/Trunk Assessment   Upper Extremity Assessment Upper Extremity Assessment: Generalized weakness    Lower Extremity Assessment Lower Extremity Assessment: Generalized weakness (grossly at least 3+/5 throughout; limited willingness to offer resistance to L LE MMT secondary to reported knee pain)       Communication      Cognition Arousal/Alertness: Awake/alert Behavior During Therapy: WFL for tasks assessed/performed Overall Cognitive Status: Within Functional Limits for tasks assessed                      General Comments      Exercises Other Exercises Other Exercises: Sit/stand x3 with RW, min assist +2-broad BOS, heavy use of UEs to assist; slow and guarded, very effortful   Assessment/Plan    PT Assessment Patient needs continued PT services  PT Problem List Decreased strength;Decreased range of motion;Decreased activity tolerance;Decreased balance;Decreased mobility;Obesity;Cardiopulmonary status limiting activity          PT Treatment Interventions DME instruction;Gait training;Functional mobility training;Therapeutic activities;Therapeutic exercise;Balance training;Patient/family education    PT Goals (Current goals can be found in the Care Plan section)  Acute Rehab PT Goals Patient Stated Goal: to be admitted to hospital PT Goal Formulation: With patient Time For Goal Achievement: 04/24/16 Potential to Achieve Goals: Fair    Frequency Min 2X/week   Barriers to discharge Decreased caregiver support      Co-evaluation               End of Session Equipment Utilized During Treatment: Gait belt Activity Tolerance: Patient limited by  fatigue;Patient limited by pain Patient left: in bed;with call bell/phone within reach      Functional Assessment Tool Used: clinical judgement Functional Limitation: Mobility: Walking and moving around Mobility: Walking and Moving Around Current Status [id number]): At least 60 percent but less than 80 percent impaired, limited or restricted Mobility: Walking and Moving Around Goal Status [phone]): At least 1 percent but less than 20 percent impaired, limited or restricted    Time: 0850-0920 PT Time Calculation (min) (ACUTE ONLY): 30 min   Charges:   PT Evaluation $PT Eval Low Complexity: 1 Procedure PT Treatments $Therapeutic Activity: 8-22 mins   PT G Codes:   PT G-Codes **NOT FOR INPATIENT CLASS** Functional Assessment Tool Used: clinical judgement Functional Limitation: Mobility: Walking and moving around Mobility: Walking and Moving Around Current Status [id number]): At least 60 percent but less than 80 percent impaired, limited or restricted Mobility: Walking and Moving Around Goal Status [phone]): At least 1 percent but less than 20 percent impaired, limited or restricted   Treatment session lead, directed by [employer], SPT; documented by Tera Helper, [place]. Owens Shark, PT, DPT, NCS 04/10/16, 10:39 AM [phone]

## 2016-04-10 NOTE — Care Management Note (Signed)
Case Management Note  Patient Details  Name: Loretta Wade MRN: [id number] Date of Birth: Jul 14, 1950  Subjective/Objective:    Spoke to Rennis Harding at [phone]. [phone]. Patient has services under the senior Bridge program.  She has identified that the patient already has home PT provided as well as a RN who checks weekly on the patient.                Action/Plan:   Expected Discharge Date:                  Expected Discharge Plan:     In-House Referral:     Discharge planning Services     Post Acute Care Choice:    Choice offered to:     DME Arranged:    DME Agency:     HH Arranged:    HH Agency:     Status of Service:     If discussed at [employer] of Stay Meetings, dates discussed:    Additional Comments:  Beau Fanny, RN 04/10/2016, 2:37 PM

## 2016-04-11 NOTE — Care Management Note (Signed)
Case Management Note  Patient Details  Name: Loretta Wade MRN: [id number] Date of Birth: 1950/12/18  Subjective/Objective:   Called [employer] Rep. Rennis Harding again. Relayed to her that the patient seemed to be under the impression that her OP PT and services needed a new order. The [employer] rep is sending the information to the patients [employer] CM Loretta Wade, so that she will reach out to her. I have relayed this information to the patient who confirms this is the correct CM for her. No further needs at this time.                 Action/Plan:   Expected Discharge Date:                  Expected Discharge Plan:     In-House Referral:     Discharge planning Services  CM Consult  Post Acute Care Choice:    Choice offered to:     DME Arranged:    DME Agency:     HH Arranged:    HH Agency:     Status of Service:     If discussed at [employer] of Stay Meetings, dates discussed:    Additional Comments:  Beau Fanny, RN 04/11/2016, 10:51 AM

## 2016-04-11 NOTE — Care Management Note (Signed)
Case Management Note  Patient Details  Name: DEMIANA AO MRN: [id number] Date of Birth: 03-13-1950  Subjective/Objective:      Spoke to the nurse at [facility], who is well aware of Ms [place]on. She apparently declined to work with [facility] PT when they arrived , per a note they just got. I have asked and they have agreed to sign orders one more time to reinstate the [facility] services ordered previously on 03/28/16.    At this point Dr Manuella Ghazi is ready to discharge the pt. From his service due to the behavior of the patient.           Action/Plan:   Expected Discharge Date:                  Expected Discharge Plan:     In-House Referral:     Discharge planning Services  CM Consult  Post Acute Care Choice:    Choice offered to:     DME Arranged:    DME Agency:     HH Arranged:    HH Agency:     Status of Service:     If discussed at [employer] of Stay Meetings, dates discussed:    Additional Comments:  Beau Fanny, RN 04/11/2016, 11:09 AM

## 2016-04-12 ENCOUNTER — Telehealth: Payer: Self-pay | Admitting: Family Medicine

## 2016-04-12 NOTE — Telephone Encounter (Signed)
Loretta Wade from [facility] requesting order. OT order for 1 week 1 to complete plan of care. [phone]

## 2016-04-12 NOTE — Telephone Encounter (Signed)
Patient has been discharged from the practice for noncompliance

## 2016-04-13 ENCOUNTER — Telehealth: Payer: Self-pay | Admitting: Family Medicine

## 2016-04-13 NOTE — Telephone Encounter (Signed)
Loretta Wade from [name] informed that Dr Manuella Ghazi has declined services.

## 2016-04-13 NOTE — Telephone Encounter (Signed)
Referral for both home health and physical therapy have already been entered and it should assistance with bathing.

## 2016-04-13 NOTE — Telephone Encounter (Signed)
PT WANTS YOU TO CALL HER ABOUT HAVING A HOME HEALTH NURSE TO COME AND HELP WITH HER BATHES ETC IF YOU WILL APPROVE IT. PLEASE CALL.

## 2016-04-19 ENCOUNTER — Telehealth: Payer: Self-pay | Admitting: Family Medicine

## 2016-04-19 NOTE — Telephone Encounter (Signed)
Pt states that the 18th will be on a Sunday, therefore she is requesting a refill on Hydrocodone and would like to pick it up on this Friday.

## 2016-04-20 ENCOUNTER — Other Ambulatory Visit: Payer: Self-pay | Admitting: Emergency Medicine

## 2016-04-20 DIAGNOSIS — M797 Fibromyalgia: Secondary | ICD-10-CM

## 2016-04-20 MED ORDER — HYDROCODONE-ACETAMINOPHEN 5-325 MG PO TABS: 1.0000 | ORAL_TABLET | Freq: Four times a day (QID) | ORAL | 0 refills | Status: DC | PRN

## 2016-04-20 NOTE — Telephone Encounter (Signed)
Pt verbally informed to pick up prescription on Friday

## 2016-04-20 NOTE — Telephone Encounter (Signed)
Patient may pick up script on Friday.

## 2016-05-03 ENCOUNTER — Other Ambulatory Visit: Payer: Self-pay | Admitting: Family Medicine

## 2016-05-03 DIAGNOSIS — G2581 Restless legs syndrome: Secondary | ICD-10-CM

## 2016-05-09 ENCOUNTER — Other Ambulatory Visit: Payer: Self-pay | Admitting: Family Medicine

## 2016-05-09 DIAGNOSIS — G2581 Restless legs syndrome: Secondary | ICD-10-CM

## 2016-05-10 ENCOUNTER — Other Ambulatory Visit: Payer: Self-pay | Admitting: Family Medicine

## 2016-05-11 ENCOUNTER — Other Ambulatory Visit: Payer: Self-pay

## 2016-05-11 ENCOUNTER — Other Ambulatory Visit: Payer: Self-pay | Admitting: Family Medicine

## 2016-05-11 ENCOUNTER — Telehealth: Payer: Self-pay | Admitting: Family Medicine

## 2016-05-11 DIAGNOSIS — R0981 Nasal congestion: Secondary | ICD-10-CM

## 2016-05-11 DIAGNOSIS — M797 Fibromyalgia: Secondary | ICD-10-CM

## 2016-05-11 DIAGNOSIS — N393 Stress incontinence (female) (male): Secondary | ICD-10-CM

## 2016-05-11 DIAGNOSIS — G2581 Restless legs syndrome: Secondary | ICD-10-CM

## 2016-05-11 DIAGNOSIS — K219 Gastro-esophageal reflux disease without esophagitis: Secondary | ICD-10-CM

## 2016-05-11 DIAGNOSIS — E785 Hyperlipidemia, unspecified: Secondary | ICD-10-CM

## 2016-05-11 DIAGNOSIS — N39490 Overflow incontinence: Secondary | ICD-10-CM

## 2016-05-11 NOTE — Telephone Encounter (Signed)
Pt called stating that she wanted refill on her medications.  Patient was informed by Barnie Alderman, CMA for [name], that our office could no longer refill her prescriptions due to her no longer being a patient any more.  [name] also told patient that she was sent a certified discharge letter on 04/10/16 for non compliance.  Patient stated that she never received a letter. Patient asked for another letter to be mailed to her.  The letter has been re-printed and will be re-mailed to patient today.

## 2016-05-11 NOTE — Telephone Encounter (Signed)
Patient is requesting a refill on the following medications.  Patient was also informed that the medication will only be written for 30 days which would give her enough time to find another provider due to being dismissed from the practice on 04/10/16.  Patient was also informed that she would not be able to see another provider in the practice per our office policy.  Patient stated that she understood.  Patient uses Engineer, structural.   Fluticasone Albuterol Citalopram Dexilant Hydrocodone Oxybutynin Pantoprazole Pregabalin Ranitidine Ropinidrole Welchol

## 2016-05-11 NOTE — Telephone Encounter (Signed)
Routed to Dr. Shah for approval 

## 2016-05-12 NOTE — Telephone Encounter (Signed)
Refill request has been routed to [name] for approval: Patient is requesting a refill on the following medications.  Patient was also informed that the medication will only be written for 30 days which would give her enough time to find another provider due to being dismissed from the practice on 04/10/16.  Patient was also informed that she would not be able to see another provider in the practice per our office policy.  Patient stated that she understood.  Patient uses Engineer, structural.   Fluticasone Albuterol Citalopram Dexilant Hydrocodone Oxybutynin Pantoprazole Pregabalin Ranitidine Ropinidrole Welchol

## 2016-05-12 NOTE — Telephone Encounter (Signed)
Okay to refill the medications listed for 30 days, she must find a new primary care provider to continue her care.

## 2016-05-16 ENCOUNTER — Other Ambulatory Visit: Payer: Self-pay | Admitting: Family Medicine

## 2016-05-16 DIAGNOSIS — M797 Fibromyalgia: Secondary | ICD-10-CM

## 2016-05-16 DIAGNOSIS — G2581 Restless legs syndrome: Secondary | ICD-10-CM

## 2016-05-16 MED ORDER — ALBUTEROL SULFATE (2.5 MG/3ML) 0.083% IN NEBU: INHALATION_SOLUTION | RESPIRATORY_TRACT | 0 refills | Status: AC

## 2016-05-16 MED ORDER — OXYBUTYNIN CHLORIDE 5 MG PO TABS: ORAL_TABLET | ORAL | 0 refills | Status: DC

## 2016-05-16 MED ORDER — CITALOPRAM HYDROBROMIDE 20 MG PO TABS: ORAL_TABLET | ORAL | 0 refills | Status: DC

## 2016-05-16 MED ORDER — PREGABALIN 150 MG PO CAPS: 150.0000 mg | ORAL_CAPSULE | Freq: Two times a day (BID) | ORAL | 0 refills | Status: DC

## 2016-05-16 MED ORDER — HYDROCODONE-ACETAMINOPHEN 5-325 MG PO TABS: 1.0000 | ORAL_TABLET | Freq: Four times a day (QID) | ORAL | 0 refills | Status: DC | PRN

## 2016-05-16 MED ORDER — FLUTICASONE PROPIONATE 50 MCG/ACT NA SUSP: 2.0000 | Freq: Every day | NASAL | 0 refills | Status: DC

## 2016-05-16 MED ORDER — PANTOPRAZOLE SODIUM 40 MG PO TBEC: DELAYED_RELEASE_TABLET | ORAL | 0 refills | Status: DC

## 2016-05-16 MED ORDER — RANITIDINE HCL 150 MG PO TABS: 150.0000 mg | ORAL_TABLET | Freq: Two times a day (BID) | ORAL | 0 refills | Status: DC

## 2016-05-16 MED ORDER — DEXLANSOPRAZOLE 60 MG PO CPDR: DELAYED_RELEASE_CAPSULE | ORAL | 0 refills | Status: DC

## 2016-05-16 MED ORDER — ROPINIROLE HCL 0.25 MG PO TABS: 0.7500 mg | ORAL_TABLET | Freq: Every day | ORAL | 0 refills | Status: DC

## 2016-05-16 MED ORDER — COLESEVELAM HCL 625 MG PO TABS: ORAL_TABLET | ORAL | 0 refills | Status: DC

## 2016-05-16 NOTE — Telephone Encounter (Signed)
Medication has been refilled and sent to [facility]

## 2016-06-06 ENCOUNTER — Other Ambulatory Visit: Payer: Self-pay | Admitting: Family Medicine

## 2016-06-06 NOTE — Telephone Encounter (Signed)
Medica has been refilled and sent to [facility]

## 2016-07-06 ENCOUNTER — Other Ambulatory Visit: Payer: Self-pay | Admitting: Family Medicine

## 2016-07-06 DIAGNOSIS — R0981 Nasal congestion: Secondary | ICD-10-CM

## 2016-07-06 NOTE — Telephone Encounter (Signed)
Patient requesting refill of Flonase to [facility].

## 2016-08-28 ENCOUNTER — Other Ambulatory Visit: Payer: Self-pay | Admitting: Family Medicine

## 2016-09-29 ENCOUNTER — Other Ambulatory Visit: Payer: Self-pay | Admitting: Family Medicine

## 2016-10-03 ENCOUNTER — Inpatient Hospital Stay
Admission: EM | Admit: 2016-10-03 | Discharge: 2016-10-11 | DRG: 189 | Disposition: A | Discharge status: Skilled Nursing Facility | Payer: Medicare PPO | Attending: Internal Medicine | Admitting: Internal Medicine

## 2016-10-03 ENCOUNTER — Encounter: Payer: Self-pay | Admitting: Emergency Medicine

## 2016-10-03 ENCOUNTER — Emergency Department: Payer: Medicare PPO

## 2016-10-03 DIAGNOSIS — R200 Anesthesia of skin: Secondary | ICD-10-CM | POA: Diagnosis not present

## 2016-10-03 DIAGNOSIS — F419 Anxiety disorder, unspecified: Secondary | ICD-10-CM

## 2016-10-03 DIAGNOSIS — Z7951 Long term (current) use of inhaled steroids: Secondary | ICD-10-CM | POA: Diagnosis not present

## 2016-10-03 DIAGNOSIS — Z90710 Acquired absence of both cervix and uterus: Secondary | ICD-10-CM | POA: Diagnosis not present

## 2016-10-03 DIAGNOSIS — K648 Other hemorrhoids: Secondary | ICD-10-CM | POA: Diagnosis present

## 2016-10-03 DIAGNOSIS — Z8249 Family history of ischemic heart disease and other diseases of the circulatory system: Secondary | ICD-10-CM | POA: Diagnosis not present

## 2016-10-03 DIAGNOSIS — M797 Fibromyalgia: Secondary | ICD-10-CM | POA: Diagnosis present

## 2016-10-03 DIAGNOSIS — G4733 Obstructive sleep apnea (adult) (pediatric): Secondary | ICD-10-CM | POA: Diagnosis present

## 2016-10-03 DIAGNOSIS — K921 Melena: Secondary | ICD-10-CM | POA: Diagnosis present

## 2016-10-03 DIAGNOSIS — M1990 Unspecified osteoarthritis, unspecified site: Secondary | ICD-10-CM | POA: Diagnosis present

## 2016-10-03 DIAGNOSIS — R350 Frequency of micturition: Secondary | ICD-10-CM | POA: Diagnosis present

## 2016-10-03 DIAGNOSIS — Z79899 Other long term (current) drug therapy: Secondary | ICD-10-CM | POA: Diagnosis not present

## 2016-10-03 DIAGNOSIS — I509 Heart failure, unspecified: Secondary | ICD-10-CM | POA: Diagnosis present

## 2016-10-03 DIAGNOSIS — Z803 Family history of malignant neoplasm of breast: Secondary | ICD-10-CM | POA: Diagnosis not present

## 2016-10-03 DIAGNOSIS — K625 Hemorrhage of anus and rectum: Secondary | ICD-10-CM | POA: Diagnosis not present

## 2016-10-03 DIAGNOSIS — Z87891 Personal history of nicotine dependence: Secondary | ICD-10-CM

## 2016-10-03 DIAGNOSIS — E785 Hyperlipidemia, unspecified: Secondary | ICD-10-CM | POA: Diagnosis present

## 2016-10-03 DIAGNOSIS — K210 Gastro-esophageal reflux disease with esophagitis: Secondary | ICD-10-CM | POA: Diagnosis present

## 2016-10-03 DIAGNOSIS — Z96651 Presence of right artificial knee joint: Secondary | ICD-10-CM | POA: Diagnosis present

## 2016-10-03 DIAGNOSIS — J441 Chronic obstructive pulmonary disease with (acute) exacerbation: Secondary | ICD-10-CM | POA: Diagnosis not present

## 2016-10-03 DIAGNOSIS — R202 Paresthesia of skin: Secondary | ICD-10-CM

## 2016-10-03 DIAGNOSIS — Z6841 Body Mass Index (BMI) 40.0 and over, adult: Secondary | ICD-10-CM

## 2016-10-03 DIAGNOSIS — Z9981 Dependence on supplemental oxygen: Secondary | ICD-10-CM

## 2016-10-03 DIAGNOSIS — Z9049 Acquired absence of other specified parts of digestive tract: Secondary | ICD-10-CM | POA: Diagnosis not present

## 2016-10-03 DIAGNOSIS — I110 Hypertensive heart disease with heart failure: Secondary | ICD-10-CM | POA: Diagnosis present

## 2016-10-03 DIAGNOSIS — J9621 Acute and chronic respiratory failure with hypoxia: Principal | ICD-10-CM | POA: Diagnosis present

## 2016-10-03 LAB — URINALYSIS, COMPLETE (UACMP) WITH MICROSCOPIC
BILIRUBIN URINE: NEGATIVE
Bacteria, UA: NONE SEEN
GLUCOSE, UA: NEGATIVE mg/dL
Ketones, ur: NEGATIVE mg/dL
LEUKOCYTES UA: NEGATIVE
NITRITE: NEGATIVE
Protein, ur: NEGATIVE mg/dL
SPECIFIC GRAVITY, URINE: 1.025 (ref 1.005–1.030)
pH: 5 (ref 5.0–8.0)

## 2016-10-03 LAB — BASIC METABOLIC PANEL
ANION GAP: 7 (ref 5–15)
BUN: 14 mg/dL (ref 6–20)
CO2: 31 mmol/L (ref 22–32)
CREATININE: 0.79 mg/dL (ref 0.44–1.00)
Calcium: 9.5 mg/dL (ref 8.9–10.3)
Chloride: 104 mmol/L (ref 101–111)
GFR calc Af Amer: >60 mL/min (ref >60)
GFR calc non Af Amer: >60 mL/min (ref >60)
GLUCOSE: 129 mg/dL — AB (ref 65–99)
POTASSIUM: 4.5 mmol/L (ref 3.5–5.1)
SODIUM: 142 mmol/L (ref 135–145)

## 2016-10-03 LAB — TROPONIN I

## 2016-10-03 LAB — CBC
HCT: 36.8 % (ref 35.0–47.0)
HEMOGLOBIN: 12 g/dL (ref 12.0–16.0)
MCH: 31.6 pg (ref 26.0–34.0)
MCHC: 32.6 g/dL (ref 32.0–36.0)
MCV: 96.9 fL (ref 80.0–100.0)
Platelets: 195 10*3/uL (ref 150–440)
RBC: 3.8 MIL/uL (ref 3.80–5.20)
RDW: 14.6 % — ABNORMAL HIGH (ref 11.5–14.5)
WBC: 5.1 10*3/uL (ref 3.6–11.0)

## 2016-10-03 LAB — BRAIN NATRIURETIC PEPTIDE: B NATRIURETIC PEPTIDE 5: 29 pg/mL (ref 0.0–100.0)

## 2016-10-03 LAB — MRSA PCR SCREENING: MRSA BY PCR: NEGATIVE

## 2016-10-03 MED ORDER — VITAMIN D 1000 UNITS PO TABS
1000.0000 [IU] | ORAL_TABLET | Freq: Every day | ORAL | Status: DC
Start: 2016-10-03 — End: 2016-10-12
  Administered 2016-10-03 – 2016-10-11 (×9): 1000 [IU] via ORAL
  Filled 2016-10-03 (×9): qty 1

## 2016-10-03 MED ORDER — METHYLPREDNISOLONE SODIUM SUCC 125 MG IJ SOLR
125.0000 mg | Freq: Once | INTRAMUSCULAR | Status: AC
  Administered 2016-10-03: 125 mg via INTRAVENOUS
  Filled 2016-10-03: qty 2

## 2016-10-03 MED ORDER — ACETAMINOPHEN 325 MG PO TABS: 650.0000 mg | ORAL_TABLET | Freq: Every day | ORAL | Status: DC | PRN

## 2016-10-03 MED ORDER — UMECLIDINIUM-VILANTEROL 62.5-25 MCG/INH IN AEPB
1.0000 | INHALATION_SPRAY | Freq: Every day | RESPIRATORY_TRACT | Status: DC
  Administered 2016-10-03 – 2016-10-11 (×9): 1 via RESPIRATORY_TRACT
  Filled 2016-10-03 (×2): qty 14

## 2016-10-03 MED ORDER — CHLORPHEN-PE-ACETAMINOPHEN 2-5-325 MG PO TABS: 2.0000 | ORAL_TABLET | Freq: Every day | ORAL | Status: DC | PRN

## 2016-10-03 MED ORDER — CITALOPRAM HYDROBROMIDE 20 MG PO TABS
20.0000 mg | ORAL_TABLET | Freq: Every day | ORAL | Status: DC
  Administered 2016-10-03 – 2016-10-11 (×9): 20 mg via ORAL
  Filled 2016-10-03 (×9): qty 1

## 2016-10-03 MED ORDER — PREGABALIN 75 MG PO CAPS
150.0000 mg | ORAL_CAPSULE | Freq: Two times a day (BID) | ORAL | Status: DC
  Administered 2016-10-03 – 2016-10-11 (×17): 150 mg via ORAL
  Filled 2016-10-03 (×17): qty 2

## 2016-10-03 MED ORDER — HYDROCOD POLST-CPM POLST ER 10-8 MG/5ML PO SUER
5.0000 mL | Freq: Two times a day (BID) | ORAL | Status: DC | PRN
  Administered 2016-10-05 – 2016-10-11 (×5): 5 mL via ORAL
  Filled 2016-10-03 (×6): qty 5

## 2016-10-03 MED ORDER — FUROSEMIDE 40 MG PO TABS
40.0000 mg | ORAL_TABLET | Freq: Two times a day (BID) | ORAL | Status: DC
  Administered 2016-10-03 – 2016-10-04 (×2): 40 mg via ORAL
  Filled 2016-10-03 (×2): qty 1

## 2016-10-03 MED ORDER — IPRATROPIUM-ALBUTEROL 0.5-2.5 (3) MG/3ML IN SOLN
3.0000 mL | Freq: Four times a day (QID) | RESPIRATORY_TRACT | Status: DC
  Administered 2016-10-03: 3 mL via RESPIRATORY_TRACT

## 2016-10-03 MED ORDER — IPRATROPIUM-ALBUTEROL 0.5-2.5 (3) MG/3ML IN SOLN
RESPIRATORY_TRACT | Status: AC
  Administered 2016-10-03: 3 mL via RESPIRATORY_TRACT
  Filled 2016-10-03: qty 3

## 2016-10-03 MED ORDER — HYDROCODONE-ACETAMINOPHEN 5-325 MG PO TABS
1.0000 | ORAL_TABLET | Freq: Four times a day (QID) | ORAL | Status: DC | PRN
  Administered 2016-10-03 – 2016-10-11 (×27): 1 via ORAL
  Filled 2016-10-03 (×27): qty 1

## 2016-10-03 MED ORDER — DOCUSATE SODIUM 100 MG PO CAPS
100.0000 mg | ORAL_CAPSULE | Freq: Two times a day (BID) | ORAL | Status: DC
  Administered 2016-10-03 – 2016-10-11 (×17): 100 mg via ORAL
  Filled 2016-10-03 (×17): qty 1

## 2016-10-03 MED ORDER — DIPHENHYDRAMINE HCL 25 MG PO CAPS
25.0000 mg | ORAL_CAPSULE | Freq: Every day | ORAL | Status: DC | PRN
  Administered 2016-10-07: 25 mg via ORAL
  Filled 2016-10-03: qty 1

## 2016-10-03 MED ORDER — GUAIFENESIN-DM 100-10 MG/5ML PO SYRP
5.0000 mL | ORAL_SOLUTION | ORAL | Status: DC | PRN
  Administered 2016-10-03 – 2016-10-04 (×2): 10 mL via ORAL
  Administered 2016-10-08: 5 mL via ORAL
  Filled 2016-10-03 (×4): qty 10

## 2016-10-03 MED ORDER — PHENYLEPHRINE HCL 10 MG PO TABS
10.0000 mg | ORAL_TABLET | Freq: Every day | ORAL | Status: DC | PRN
  Filled 2016-10-03: qty 1

## 2016-10-03 MED ORDER — IPRATROPIUM-ALBUTEROL 0.5-2.5 (3) MG/3ML IN SOLN
3.0000 mL | Freq: Four times a day (QID) | RESPIRATORY_TRACT | Status: DC
  Administered 2016-10-03 – 2016-10-04 (×5): 3 mL via RESPIRATORY_TRACT
  Filled 2016-10-03 (×5): qty 3

## 2016-10-03 MED ORDER — FLUTICASONE PROPIONATE 50 MCG/ACT NA SUSP
2.0000 | Freq: Every day | NASAL | Status: DC
  Administered 2016-10-03 – 2016-10-11 (×9): 2 via NASAL
  Filled 2016-10-03: qty 16

## 2016-10-03 MED ORDER — VITAMIN B-12 1000 MCG PO TABS
500.0000 ug | ORAL_TABLET | Freq: Every day | ORAL | Status: DC
  Administered 2016-10-03 – 2016-10-11 (×9): 500 ug via ORAL
  Filled 2016-10-03 (×9): qty 1

## 2016-10-03 MED ORDER — PANTOPRAZOLE SODIUM 40 MG PO TBEC: 40.0000 mg | DELAYED_RELEASE_TABLET | Freq: Every day | ORAL | Status: DC

## 2016-10-03 MED ORDER — ACETAMINOPHEN 325 MG PO TABS: 650.0000 mg | ORAL_TABLET | Freq: Four times a day (QID) | ORAL | Status: DC | PRN

## 2016-10-03 MED ORDER — METHYLPREDNISOLONE SODIUM SUCC 125 MG IJ SOLR
60.0000 mg | Freq: Four times a day (QID) | INTRAMUSCULAR | Status: DC
  Administered 2016-10-03 – 2016-10-04 (×4): 60 mg via INTRAVENOUS
  Filled 2016-10-03 (×4): qty 2

## 2016-10-03 MED ORDER — NYSTATIN 100000 UNIT/ML MT SUSP
5.0000 mL | Freq: Four times a day (QID) | OROMUCOSAL | Status: DC
  Administered 2016-10-03 – 2016-10-11 (×33): 500000 [IU] via ORAL
  Filled 2016-10-03 (×33): qty 5

## 2016-10-03 MED ORDER — FAMOTIDINE 20 MG PO TABS
20.0000 mg | ORAL_TABLET | Freq: Two times a day (BID) | ORAL | Status: DC
Start: 2016-10-03 — End: 2016-10-10
  Administered 2016-10-03 – 2016-10-10 (×15): 20 mg via ORAL
  Filled 2016-10-03 (×15): qty 1

## 2016-10-03 MED ORDER — ENOXAPARIN SODIUM 40 MG/0.4ML ~~LOC~~ SOLN
40.0000 mg | SUBCUTANEOUS | Status: DC
  Administered 2016-10-03: 40 mg via SUBCUTANEOUS
  Filled 2016-10-03: qty 0.4

## 2016-10-03 MED ORDER — PANTOPRAZOLE SODIUM 40 MG PO TBEC
40.0000 mg | DELAYED_RELEASE_TABLET | Freq: Two times a day (BID) | ORAL | Status: DC
  Administered 2016-10-03 – 2016-10-11 (×17): 40 mg via ORAL
  Filled 2016-10-03 (×17): qty 1

## 2016-10-03 MED ORDER — COLESEVELAM HCL 625 MG PO TABS
625.0000 mg | ORAL_TABLET | Freq: Every day | ORAL | Status: DC
  Administered 2016-10-04 – 2016-10-11 (×8): 625 mg via ORAL
  Filled 2016-10-03 (×9): qty 1

## 2016-10-03 MED ORDER — ALPRAZOLAM 0.5 MG PO TABS: 0.5000 mg | ORAL_TABLET | Freq: Every day | ORAL | Status: DC | PRN

## 2016-10-03 MED ORDER — MAGNESIUM OXIDE 400 (241.3 MG) MG PO TABS
400.0000 mg | ORAL_TABLET | Freq: Every day | ORAL | Status: DC
  Administered 2016-10-03 – 2016-10-11 (×9): 400 mg via ORAL
  Filled 2016-10-03 (×9): qty 1

## 2016-10-03 MED ORDER — ALBUTEROL SULFATE (2.5 MG/3ML) 0.083% IN NEBU: 2.5000 mg | INHALATION_SOLUTION | Freq: Four times a day (QID) | RESPIRATORY_TRACT | Status: DC | PRN

## 2016-10-03 MED ORDER — NYSTATIN 100000 UNIT/GM EX OINT
1.0000 "application " | TOPICAL_OINTMENT | Freq: Three times a day (TID) | CUTANEOUS | Status: DC
  Administered 2016-10-03 – 2016-10-08 (×15): 1 via TOPICAL
  Filled 2016-10-03 (×2): qty 15

## 2016-10-03 MED ORDER — ROPINIROLE HCL 0.25 MG PO TABS
0.7500 mg | ORAL_TABLET | Freq: Every day | ORAL | Status: DC
  Administered 2016-10-03 – 2016-10-04 (×2): 0.75 mg via ORAL
  Administered 2016-10-05: 0.25 mg via ORAL
  Administered 2016-10-06 – 2016-10-10 (×5): 0.75 mg via ORAL
  Filled 2016-10-03 (×9): qty 3

## 2016-10-03 MED ORDER — PROPRANOLOL HCL ER 80 MG PO CP24
80.0000 mg | ORAL_CAPSULE | Freq: Every day | ORAL | Status: DC
  Administered 2016-10-03 – 2016-10-04 (×2): 80 mg via ORAL
  Filled 2016-10-03 (×2): qty 1

## 2016-10-03 MED ORDER — OXYBUTYNIN CHLORIDE 5 MG PO TABS
5.0000 mg | ORAL_TABLET | Freq: Every day | ORAL | Status: DC
  Administered 2016-10-03 – 2016-10-11 (×9): 5 mg via ORAL
  Filled 2016-10-03 (×9): qty 1

## 2016-10-03 MED ORDER — LOPERAMIDE HCL 2 MG PO CAPS: 2.0000 mg | ORAL_CAPSULE | ORAL | Status: DC | PRN

## 2016-10-03 MED ORDER — ROPINIROLE HCL 0.25 MG PO TABS
0.2500 mg | ORAL_TABLET | Freq: Every day | ORAL | Status: DC
  Filled 2016-10-03: qty 1

## 2016-10-03 MED ORDER — ALBUTEROL SULFATE (2.5 MG/3ML) 0.083% IN NEBU
5.0000 mg | INHALATION_SOLUTION | Freq: Once | RESPIRATORY_TRACT | Status: AC
  Administered 2016-10-03: 5 mg via RESPIRATORY_TRACT
  Filled 2016-10-03: qty 6

## 2016-10-03 NOTE — Progress Notes (Signed)
Pharmacist - Prescriber Communication  The order for chlorpheniramine 2 mg - phenylephrine 5 mg - APAP 325 mg tablets take 2 tablets by mouth daily as needed for nasal congestion/cough has been automatically interchanged to diphenhydramine 25 mg - phenylephrine 10 mg - APAP 650 mg po once daily PRN for nasal congestion/cough per P&T approved interchange.   Theon Sobotka A. [place], [place].D., BCPS Clinical Pharmacist 10/03/2016 13:06

## 2016-10-03 NOTE — ED Provider Notes (Signed)
[facility] Emergency Department Provider Note  ____________________________________________   I have reviewed the triage vital signs and the nursing notes.   HISTORY  Chief Complaint Urinary Frequency; Shortness of Breath; and Chest Pain    HPI Loretta Wade is a 66 y.o. female with a history of multiple medical problems who is on more than 20 medications, history of COPD, CHF, fibromyalgia, on 2 L oxygen at home, has had for the last several weeks increasing shortness of breath, wheeze, cough. Tried her home nebulizers with limited success. Denies chest pain unless she is in the active coughing. When she does cough it hurts her chest wall. She does not have any fever at home. She did see her doctor they tried various remedies which did not include antibiotics she states. She states she is progressively getting worse. She also states that she is having urinary symptoms, she states that she has dysuria. She denies abdominal pain or vomiting at this time as she has had some nausea. She also endorses diarrhea. Multiple different complaints and otherwise. Most compelling of these however seems to be the patient's shortness of breath and wheeze. Cough is sometimes productive but usually not      Past Medical History:  Diagnosis Date  . Anemia   . CHF (congestive heart failure) ([place])   . COPD (chronic obstructive pulmonary disease) ([facility])    on 2L home o2  . Edema   . Esophageal reflux   . Fatigue   . Fibromyalgia    Following with pain management  . Hypertension   . Nocturia   . OA (osteoarthritis)   . Sleep apnea    not on CPAP    Patient Active Problem List   Diagnosis Date Noted  . Candidal intertrigo 02/08/2016  . Dysuria 02/08/2016  . Requires assistance with activities of daily living (ADL) 02/08/2016  . Oral thrush 11/10/2015  . Tachypnea 11/01/2015  . Acute non-recurrent maxillary sinusitis 10/29/2015  . Fall 07/11/2015  . Dyslipidemia  06/17/2015  . Overflow stress incontinence of urine in female 06/17/2015  . Chronic pain 06/10/2015  . Chronic low back pain (Location of Tertiary source of pain) (Bilateral) (R>L) 06/10/2015  . Chronic shoulder pain (Location of Secondary source of pain) (Left) 06/10/2015  . Costochondritis (Location of Primary Source of Pain) 06/10/2015  . Long term current use of opiate analgesic 06/10/2015  . Long term prescription opiate use 06/10/2015  . Opiate use 06/10/2015  . Encounter for therapeutic drug level monitoring 06/10/2015  . Encounter for pain management planning 06/10/2015  . Chronic feet pain (Bilateral) (R>L) 06/10/2015  . Osteoarthritis 06/10/2015  . Osteoarthritis of shoulder (Left) 06/10/2015  . COPD exacerbation ([place]) 04/12/2015  . Poor mobility 04/12/2015  . Fibromyalgia 03/23/2015  . GERD (gastroesophageal reflux disease) 03/23/2015  . MRSA infection 11/17/2014  . Diastolic CHF, acute on chronic ([facility]) 11/12/2014  . Morbid obesity with BMI of 50.0-59.9, adult ([place]) 11/12/2014  . Neck pain, chronic 11/12/2014  . COPD (chronic obstructive pulmonary disease) ([place]) 11/04/2014  . Urinary tract infection 10/02/2014    Past Surgical History:  Procedure Laterality Date  . ABDOMINAL HYSTERECTOMY    . CHOLECYSTECTOMY    . TOTAL KNEE ARTHROPLASTY     right  . VESICOVAGINAL FISTULA CLOSURE W/ TAH      Prior to Admission medications   Medication Sig Start Date End Date Taking? Authorizing Provider  acetaminophen (TYLENOL) 325 MG tablet Take 2 tablets (650 mg total) by mouth every 6 (  six) hours as needed for mild pain (or Fever >/= 101). 11/17/14   Gladstone Lighter, MD  albuterol (PROVENTIL) (2.5 MG/3ML) 0.083% nebulizer solution USE ONE TREATMENT EVERY 6 HOURS AS NEEDED FOR WHEEZING OR SHORTNESS OF BREATH 05/16/16   Roselee Nova, MD  ALPRAZolam Duanne Moron) 0.5 MG tablet Take 1 tablet (0.5 mg total) by mouth daily as needed for anxiety. 11/30/15   Roselee Nova, MD   amoxicillin-clavulanate (AUGMENTIN) 875-125 MG tablet Take 1 tablet by mouth 2 (two) times daily. 02/16/16   Roselee Nova, MD  ANORO ELLIPTA 62.5-25 MCG/INH AEPB Inhale 1 puff into the lungs daily. 10/06/15   [provider]  Chlorphen-Phenyleph-APAP (CORICIDIN D COLD/FLU/SINUS) 2-5-325 MG TABS Take 2 tablets by mouth daily as needed (for nasal congestion/cough).    [provider]  chlorpheniramine-HYDROcodone (TUSSIONEX) 10-8 MG/5ML SUER Take 5 mLs by mouth every 12 (twelve) hours as needed for cough. 11/04/15   Demetrios Loll, MD  cholecalciferol (VITAMIN D) 1000 UNITS tablet Take 1,000 Units by mouth daily.     [provider]  citalopram (CELEXA) 20 MG tablet TAKE ONE (1) TABLET BY MOUTH EVERY DAY 05/16/16   Roselee Nova, MD  colesevelam [facility]) 625 MG tablet TAKE ONE TABLET TWICE A DAY WITH FOOD 05/16/16   Roselee Nova, MD  dexlansoprazole (DEXILANT) 60 MG capsule TAKE ONE (1) CAPSULE EACH DAY 05/16/16   Roselee Nova, MD  fluticasone [facility]) 50 MCG/ACT nasal spray USE TWO SPRAYS IN EACH NOSTRIL EVERY DAY 07/06/16   Steele Sizer, MD  furosemide (LASIX) 40 MG tablet Take 1 tablet (40 mg total) by mouth 2 (two) times daily. 02/08/15   Dustin Flock, MD  guaiFENesin-dextromethorphan (ROBITUSSIN DM) 100-10 MG/5ML syrup Take 5-10 mLs by mouth every 4 (four) hours as needed for cough.     [provider]  HYDROcodone-acetaminophen (NORCO/VICODIN) 5-325 MG tablet Take 1 tablet by mouth every 6 (six) hours as needed for moderate pain. 05/16/16   Roselee Nova, MD  loperamide (IMODIUM) 2 MG capsule Take 2-4 mg by mouth as needed for diarrhea or loose stools.    [provider]  Magnesium 250 MG TABS Take 250 mg by mouth daily.    [provider]  nystatin (MYCOSTATIN) 100000 UNIT/ML suspension Take 5 mLs (500,000 Units total) by mouth 4 (four) times daily. 11/10/15   Roselee Nova, MD  nystatin ointment (MYCOSTATIN) Apply 1  application topically 3 (three) times daily. 02/25/16   Steele Sizer, MD  oxybutynin (DITROPAN) 5 MG tablet TAKE ONE (1) TABLET BY MOUTH EVERY DAY 05/16/16   Roselee Nova, MD  pantoprazole (PROTONIX) 40 MG tablet TAKE ONE (1) TABLET BY MOUTH TWO (2) TIMES DAILY 05/16/16   Roselee Nova, MD  predniSONE (DELTASONE) 10 MG tablet 20 mg po daily for 2 days, 10 mg po daily for 2 days. 11/04/15   Demetrios Loll, MD  pregabalin (LYRICA) 150 MG capsule Take 1 capsule (150 mg total) by mouth 2 (two) times daily. 05/16/16   Roselee Nova, MD  propranolol ER (INDERAL LA) 80 MG 24 hr capsule TAKE ONE CAPSULE BY MOUTH DAILY 06/06/16   Roselee Nova, MD  ranitidine (ZANTAC) 150 MG tablet Take 1 tablet (150 mg total) by mouth 2 (two) times daily. 05/16/16   Roselee Nova, MD  rOPINIRole (REQUIP) 0.25 MG tablet TAKE THREE TABLETS BY MOUTH EVERY NIGHT AT BEDTIME  11/15/15   Roselee Nova, MD  rOPINIRole (REQUIP) 0.25 MG tablet Take 3 tablets (0.75 mg total) by mouth at bedtime. 05/16/16   Roselee Nova, MD    Allergies Patient has no known allergies.  Family History  Problem Relation Age of Onset  . Hypertension Mother   . Diabetes Unknown   . Breast cancer Daughter     Social History Social History  Substance Use Topics  . Smoking status: Former Smoker    Quit date: 11/04/1983  . Smokeless tobacco: Never Used     Comment: quit several years ago - almost 30 years ago  . Alcohol use No    Review of Systems Constitutional: No fever/chills Eyes: No visual changes. ENT: No sore throat. No stiff neck no neck pain Cardiovascular: Denies chest pain And lessening active coughing Respiratory: Positive shortness of breath. Gastrointestinal:   no vomiting.  No diarrhea.  No constipation. Genitourinary: Positive for dysuria. Musculoskeletal: Negative lower extremity swelling Skin: Negative for rash. Neurological: Negative for severe headaches, focal weakness or  numbness.   ____________________________________________   PHYSICAL EXAM:  VITAL SIGNS: ED Triage Vitals  Enc Vitals Group     BP 10/03/16 0712 138/74     Pulse Rate 10/03/16 0712 76     Resp 10/03/16 0710 (!) 25     Temp 10/03/16 0710 98.6 F (37 C)     Temp Source 10/03/16 0710 Oral     SpO2 10/03/16 0712 96 %     Weight 10/03/16 0710 (!) 347 lb (157.4 kg)     Height 10/03/16 0710 5\' 4"  (1.626 m)     Head Circumference --      Peak Flow --      Pain Score 10/03/16 0710 8     Pain Loc --      Pain Edu? --      Excl. in [place]? --     Constitutional: Alert and oriented. Chronically ill-appearing, patient speaking in full sentences with wheezing noted. Eyes: Conjunctivae are normal Head: Atraumatic HEENT: No congestion/rhinnorhea. Mucous membranes are moist.  Oropharynx non-erythematous Neck:   Nontender with no meningismus, no masses, no stridor Cardiovascular: Normal rate, regular rhythm. Grossly normal heart sounds.  Good peripheral circulation. Respiratory: Increased respiratory effort, and in no retractions, diffuse wheeze with no rales or rhonchi noted. Abdominal: Soft and nontender. No distention. No guarding no rebound morbid obesity noted Back:  There is no focal tenderness or step off.  there is no midline tenderness there are no lesions noted. there is no CVA tenderness Musculoskeletal: No lower extremity tenderness, no upper extremity tenderness. No joint effusions, no DVT signs strong distal pulses mild edema edema Neurologic:  Normal speech and language. No gross focal neurologic deficits are appreciated.  Skin:  Skin is warm, dry and intact. No rash noted. Psychiatric: Mood and affect are normal. Speech and behavior are normal.  ____________________________________________   LABS (all labs ordered are listed, but only abnormal results are displayed)  Labs Reviewed  BASIC METABOLIC PANEL - Abnormal; Notable for the following:       Result Value   Glucose,  Bld 129 (*)    All other components within normal limits  CBC - Abnormal; Notable for the following:    RDW 14.6 (*)    All other components within normal limits  CULTURE, BLOOD (ROUTINE X 2)  CULTURE, BLOOD (ROUTINE X 2)  URINE CULTURE  TROPONIN I  BRAIN NATRIURETIC PEPTIDE  URINALYSIS, COMPLETE (UACMP)  WITH MICROSCOPIC   ____________________________________________  EKG  I personally interpreted any EKGs ordered by me or triage Sinus rhythm at 74 bpm normal axis no acute ST elevation or depression noted acute ischemic changes ____________________________________________  RADIOLOGY  I reviewed any imaging ordered by me or triage that were performed during my shift and, if possible, patient and/or family made aware of any abnormal findings. ____________________________________________   PROCEDURES  Procedure(s) performed: None  Procedures  Critical Care performed: CRITICAL CARE Performed by: Schuyler Amor   Total critical care time: 40 minutes  Critical care time was exclusive of separately billable procedures and treating other patients.  Critical care was necessary to treat or prevent imminent or life-threatening deterioration.  Critical care was time spent personally by me on the following activities: development of treatment plan with patient and/or surrogate as well as nursing, discussions with consultants, evaluation of patient's response to treatment, examination of patient, obtaining history from patient or surrogate, ordering and performing treatments and interventions, ordering and review of laboratory studies, ordering and review of radiographic studies, pulse oximetry and re-evaluation of patient's condition.   ____________________________________________   INITIAL IMPRESSION / ASSESSMENT AND PLAN / ED COURSE  Pertinent labs & imaging results that were available during my care of the patient were reviewed by me and considered in my medical decision  making (see chart for details).  Patient here with cough and wheeze. She has history of COPD on Amoxil. We have increased her home oxygen. Given multiple nebulizers. She is starting to sound more clear. BNP is normal low suspicion for CHF exacerbation, chest x-ray doesn't show significant edema although somewhat limited by morbid obesity. Patient also has dysuria, we will check a urinalysis. Concern for COPD exacerbation no indication at this time the patient has pneumonia, she does not have a high white count is no infiltrate. We will continue nebulizer treatments. I've given her steroids. She is starting to improve but I do think she likely will need to be admitted. Urinalysis is pending. We'll discuss with hospitalist.    ____________________________________________   FINAL CLINICAL IMPRESSION(S) / ED DIAGNOSES  Final diagnoses:  COPD exacerbation ([place])      This chart was dictated using voice recognition software.  Despite best efforts to proofread,  errors can occur which can change meaning.      Schuyler Amor, MD 10/03/16 [phone]

## 2016-10-03 NOTE — H&P (Signed)
[place] [facility] [place] NAME: Loretta Wade    MR#:  [id number]  DATE OF BIRTH:  30-Jun-1950  DATE OF ADMISSION:  10/03/2016  PRIMARY CARE PHYSICIAN: Patient, No Pcp Per   REQUESTING/REFERRING PHYSICIAN: Schuyler Amor, MD  CHIEF COMPLAINT:   SOB, Urinary frequency HISTORY OF PRESENT ILLNESS:  Loretta Wade  is a 66 y.o. female with a known history of COPD, lives on 2 L of oxygen via nasal cannula, fibromyalgia has been having shortness of breath for 2 weeks which has been progressively getting worse associated with cough and wheezing. Patient deferred coming to the emergency department until today, finally ended up coming to the hospital as she couldn't breathe . Chest x-ray with no acute findings. Patient was reporting generalized body aches and urinary frequency. Patient was given IV Solu-Medrol, nebulizer treatments and urine cultures were sent from the emergency department. With IV Solu-Medrol and breathing treatment patient started slightly feeling better but still wheezing and hospitalist team is called to admit the patient  PAST MEDICAL HISTORY:   Past Medical History:  Diagnosis Date  . Anemia   . CHF (congestive heart failure) ([place])   . COPD (chronic obstructive pulmonary disease) ([facility])    on 2L home o2  . Edema   . Esophageal reflux   . Fatigue   . Fibromyalgia    Following with pain management  . Hypertension   . Nocturia   . OA (osteoarthritis)   . Sleep apnea    not on CPAP    PAST SURGICAL HISTOIRY:   Past Surgical History:  Procedure Laterality Date  . ABDOMINAL HYSTERECTOMY    . CHOLECYSTECTOMY    . TOTAL KNEE ARTHROPLASTY     right  . VESICOVAGINAL FISTULA CLOSURE W/ TAH      SOCIAL HISTORY:   Social History  Substance Use Topics  . Smoking status: Former Smoker    Quit date: 11/04/1983  . Smokeless tobacco: Never Used     Comment: quit several years ago - almost 30 years ago  . Alcohol use  No    FAMILY HISTORY:   Family History  Problem Relation Age of Onset  . Hypertension Mother   . Diabetes Unknown   . Breast cancer Daughter     DRUG ALLERGIES:  No Known Allergies  REVIEW OF SYSTEMS:  CONSTITUTIONAL: No fever, fatigue or weakness.  EYES: No blurred or double vision.  EARS, NOSE, AND THROAT: No tinnitus or ear pain.  RESPIRATORY: Reporting 2 week history of cough, shortness of breath, wheezing . Denies hemoptysis.  CARDIOVASCULAR: No chest pain, orthopnea, edema.  GASTROINTESTINAL: No nausea, vomiting, diarrhea or abdominal pain.  GENITOURINARY: No dysuria, hematuria.  ENDOCRINE: No polyuria, nocturia,  HEMATOLOGY: No anemia, easy bruising or bleeding SKIN: No rash or lesion. MUSCULOSKELETAL: No joint pain or arthritis.   NEUROLOGIC: No tingling, numbness, weakness.  PSYCHIATRY: No anxiety or depression.   MEDICATIONS AT HOME:   Prior to Admission medications   Medication Sig Start Date End Date Taking? Authorizing Provider  acetaminophen (TYLENOL) 325 MG tablet Take 2 tablets (650 mg total) by mouth every 6 (six) hours as needed for mild pain (or Fever >/= 101). 11/17/14  Yes Gladstone Lighter, MD  albuterol (PROVENTIL) (2.5 MG/3ML) 0.083% nebulizer solution USE ONE TREATMENT EVERY 6 HOURS AS NEEDED FOR WHEEZING OR SHORTNESS OF BREATH 05/16/16  Yes Roselee Nova, MD  ALPRAZolam Duanne Moron) 0.5 MG tablet Take 1 tablet (0.5 mg total)  by mouth daily as needed for anxiety. 11/30/15  Yes Keith Rake Asad A, MD  ANORO ELLIPTA 62.5-25 MCG/INH AEPB Inhale 1 puff into the lungs daily. 10/06/15  Yes [provider]  Chlorphen-Phenyleph-APAP (CORICIDIN D COLD/FLU/SINUS) 2-5-325 MG TABS Take 2 tablets by mouth daily as needed (for nasal congestion/cough).   Yes [provider]  chlorpheniramine-HYDROcodone (TUSSIONEX) 10-8 MG/5ML SUER Take 5 mLs by mouth every 12 (twelve) hours as needed for cough. 11/04/15  Yes Demetrios Loll, MD  cholecalciferol (VITAMIN D)  1000 UNITS tablet Take 1,000 Units by mouth daily.    Yes [provider]  citalopram (CELEXA) 20 MG tablet TAKE ONE (1) TABLET BY MOUTH EVERY DAY 05/16/16  Yes Roselee Nova, MD  colesevelam [facility]) 625 MG tablet TAKE ONE TABLET TWICE A DAY WITH FOOD 05/16/16  Yes Roselee Nova, MD  cyanocobalamin 500 MCG tablet Take 500 mcg by mouth daily.   Yes [provider]  dexlansoprazole (DEXILANT) 60 MG capsule TAKE ONE (1) CAPSULE EACH DAY 05/16/16  Yes Roselee Nova, MD  fluticasone [facility]) 50 MCG/ACT nasal spray USE TWO SPRAYS IN EACH NOSTRIL EVERY DAY 07/06/16  Yes Sowles, Drue Stager, MD  furosemide (LASIX) 40 MG tablet Take 1 tablet (40 mg total) by mouth 2 (two) times daily. 02/08/15  Yes Dustin Flock, MD  guaiFENesin-dextromethorphan [facility] DM) 100-10 MG/5ML syrup Take 5-10 mLs by mouth every 4 (four) hours as needed for cough.    Yes [provider]  loperamide (IMODIUM) 2 MG capsule Take 2-4 mg by mouth as needed for diarrhea or loose stools.   Yes [provider]  Magnesium 250 MG TABS Take 250 mg by mouth daily.   Yes [provider]  nystatin (MYCOSTATIN) 100000 UNIT/ML suspension Take 5 mLs (500,000 Units total) by mouth 4 (four) times daily. 11/10/15  Yes Roselee Nova, MD  nystatin ointment (MYCOSTATIN) Apply 1 application topically 3 (three) times daily. 02/25/16  Yes Sowles, Drue Stager, MD  oxybutynin (DITROPAN) 5 MG tablet TAKE ONE (1) TABLET BY MOUTH EVERY DAY 05/16/16  Yes Keith Rake Asad A, MD  pantoprazole (PROTONIX) 40 MG tablet TAKE ONE (1) TABLET BY MOUTH TWO (2) TIMES DAILY 05/16/16  Yes Roselee Nova, MD  predniSONE (DELTASONE) 10 MG tablet 20 mg po daily for 2 days, 10 mg po daily for 2 days. 11/04/15  Yes Demetrios Loll, MD  pregabalin (LYRICA) 150 MG capsule Take 1 capsule (150 mg total) by mouth 2 (two) times daily. 05/16/16  Yes Roselee Nova, MD  propranolol ER (INDERAL LA) 80 MG 24 hr capsule TAKE ONE CAPSULE BY  MOUTH DAILY 06/06/16  Yes Roselee Nova, MD  ranitidine (ZANTAC) 150 MG tablet Take 1 tablet (150 mg total) by mouth 2 (two) times daily. 05/16/16  Yes Roselee Nova, MD  rOPINIRole (REQUIP) 0.25 MG tablet Take 3 tablets (0.75 mg total) by mouth at bedtime. 05/16/16  Yes Roselee Nova, MD  HYDROcodone-acetaminophen (NORCO/VICODIN) 5-325 MG tablet Take 1 tablet by mouth every 6 (six) hours as needed for moderate pain. Patient not taking: Reported on 10/03/2016 05/16/16   Roselee Nova, MD  rOPINIRole (REQUIP) 0.25 MG tablet TAKE THREE TABLETS BY MOUTH EVERY NIGHT AT BEDTIME Patient not taking: Reported on 10/03/2016 11/15/15   Roselee Nova, MD      VITAL SIGNS:  Blood pressure (!) 144/62, pulse (!) 35, temperature 98.6 F (37 C), temperature  source Oral, resp. rate (!) 22, height 5\' 4"  (1.626 m), weight (!) 157.4 kg (347 lb), SpO2 96 %.  PHYSICAL EXAMINATION:  GENERAL:  66 y.o.-year-old patient lying in the bed with no acute distress.  EYES: Pupils equal, round, reactive to light and accommodation. No scleral icterus. Extraocular muscles intact.  HEENT: Head atraumatic, normocephalic. Oropharynx and nasopharynx clear.  NECK:  Supple, no jugular venous distention. No thyroid enlargement, no tenderness.  LUNGS: Diminished breath sounds bilaterally, minimal diffuse wheezing, no rales,rhonchi or crepitation. No use of accessory muscles of respiration.  CARDIOVASCULAR: S1, S2 normal. No murmurs, rubs, or gallops.  ABDOMEN: Soft, nontender, nondistended. Bowel sounds present. No organomegaly or mass.  EXTREMITIES: No pedal edema, cyanosis, or clubbing.  NEUROLOGIC: Cranial nerves II through XII are intact. Muscle strength 5/5 in all extremities. Sensation intact. Gait not checked.  PSYCHIATRIC: The patient is alert and oriented x 3.  SKIN: No obvious rash, lesion, or ulcer.   LABORATORY PANEL:   CBC  Recent Labs Lab 10/03/16 0718  WBC 5.1  HGB 12.0  HCT 36.8  PLT 195    ------------------------------------------------------------------------------------------------------------------  Chemistries   Recent Labs Lab 10/03/16 0718  NA 142  K 4.5  CL 104  CO2 31  GLUCOSE 129*  BUN 14  CREATININE 0.79  CALCIUM 9.5   ------------------------------------------------------------------------------------------------------------------  Cardiac Enzymes  Recent Labs Lab 10/03/16 0718  TROPONINI <0.03   ------------------------------------------------------------------------------------------------------------------  RADIOLOGY:  Dg Chest 2 View  Result Date: 10/03/2016 CLINICAL DATA:  Shortness of breath with wheezing EXAM: CHEST  2 VIEW COMPARISON:  April 09, 2016 FINDINGS: There is no appreciable edema or consolidation. Heart is upper normal in size with pulmonary vascularity within normal limits. No adenopathy. There is aortic atherosclerosis. No evident bone lesions. IMPRESSION: No edema or consolidation. Stable cardiac silhouette. There is aortic atherosclerosis. Aortic Atherosclerosis (ICD10-I70.0). Electronically Signed   By: Lowella Grip III M.D.   On: 10/03/2016 07:43    EKG:   Orders placed or performed during the hospital encounter of 10/03/16  . EKG 12-Lead  . EKG 12-Lead  . ED EKG within 10 minutes  . ED EKG within 10 minutes    IMPRESSION AND PLAN:  Suheily Birks  is a 66 y.o. female with a known history of COPD, lives on 2 L of oxygen via nasal cannula, fibromyalgia has been having shortness of breath for 2 weeks which has been progressively getting worse associated with cough and wheezing.    # Acute on chronic hypoxic respiratory failure secondary to COPD exacerbation Admit to MedSurg unit Solu-Medrol 125 mg IV was given in emergency department will continue Solu-Medrol 60 mg IV every 6 hours Nebulizer treatments with duo nebs every 6 hours and albuterol as needed Azithromycin Sputum culture and sensitivity is  ordered if sputum sample can be collected   #Hypertension Continue home medication propranolol  #Hyperlipidemia continue WelChol  #GERD continue Protonix and Pepcid home medications  #Anxiety continue home medication Celexa  #Frequent urination check urine culture and sensitivity  DVT prophylaxis with Lovenox subcutaneous  All the records are reviewed and case discussed with ED provider. Management plans discussed with the patient, family and they are in agreement.  CODE STATUS: fc,son is HCPOA  TOTAL TIME TAKING CARE OF THIS PATIENT: 45 minutes.   Note: This dictation was prepared with Dragon dictation along with smaller phrase technology. Any transcriptional errors that result from this process are unintentional.  Loretta Wade M.D on 10/03/2016 at 10:26 AM  Between 7am to  6pm - Pager - [phone]  After 6pm go to [email or web address] - password [email or web address] [facility]  Office  [phone]  CC: Primary care physician; Patient, No Pcp Per

## 2016-10-03 NOTE — Progress Notes (Signed)
Family Meeting Note  Advance Directive:yes  Today a meeting took place with the Patient.     The following clinical team members were present during this meeting:MD  The following were discussed:Patient's diagnosis: , Patient's progosis: Unable to determine and Goals for treatment: Continue present management. Plan of care discussed with the patient .she is full code and his son is the healthcare power of attorney.  Additional follow-up to be provided: HOSPITALIST  Time spent during discussion:16 MIN  Nicholes Mango, MD

## 2016-10-03 NOTE — ED Triage Notes (Addendum)
Pt here from home via [facility] with c/o burning with urination. Pt appears short of breath, bilateral wheezing heard. Pt is normally on 2L via [place], EMS placed pt on 4L with 95% oxygen saturation. Pt reports productive cough x3 days, has been taking tussonex without relief. Pt also reports upper chest pain that started this AM, no hx of the same.

## 2016-10-03 NOTE — Progress Notes (Signed)
Patient is a high fall risk, yellow arm band and yellow socks placed on patient.  Clarise Cruz, RN

## 2016-10-03 NOTE — ED Notes (Signed)
Pt to xray

## 2016-10-04 LAB — CBC
HCT: 35.6 % (ref 35.0–47.0)
Hemoglobin: 11.6 g/dL — ABNORMAL LOW (ref 12.0–16.0)
MCH: 31.1 pg (ref 26.0–34.0)
MCHC: 32.5 g/dL (ref 32.0–36.0)
MCV: 95.6 fL (ref 80.0–100.0)
Platelets: 206 10*3/uL (ref 150–440)
RBC: 3.73 MIL/uL — AB (ref 3.80–5.20)
RDW: 13.9 % (ref 11.5–14.5)
WBC: 8.6 10*3/uL (ref 3.6–11.0)

## 2016-10-04 LAB — BASIC METABOLIC PANEL
ANION GAP: 5 (ref 5–15)
BUN: 14 mg/dL (ref 6–20)
CALCIUM: 9.4 mg/dL (ref 8.9–10.3)
CO2: 32 mmol/L (ref 22–32)
Chloride: 102 mmol/L (ref 101–111)
Creatinine, Ser: 0.75 mg/dL (ref 0.44–1.00)
GFR calc Af Amer: >60 mL/min (ref >60)
GLUCOSE: 176 mg/dL — AB (ref 65–99)
POTASSIUM: 4.4 mmol/L (ref 3.5–5.1)
SODIUM: 139 mmol/L (ref 135–145)

## 2016-10-04 LAB — URINE CULTURE: CULTURE: NO GROWTH

## 2016-10-04 MED ORDER — METHYLPREDNISOLONE SODIUM SUCC 125 MG IJ SOLR
60.0000 mg | Freq: Three times a day (TID) | INTRAMUSCULAR | Status: DC
  Administered 2016-10-04 – 2016-10-09 (×14): 60 mg via INTRAVENOUS
  Filled 2016-10-04 (×14): qty 2

## 2016-10-04 MED ORDER — IPRATROPIUM-ALBUTEROL 0.5-2.5 (3) MG/3ML IN SOLN
3.0000 mL | RESPIRATORY_TRACT | Status: DC | PRN
  Administered 2016-10-05: 3 mL via RESPIRATORY_TRACT
  Filled 2016-10-04: qty 3

## 2016-10-04 MED ORDER — FUROSEMIDE 40 MG PO TABS: 40.0000 mg | ORAL_TABLET | Freq: Two times a day (BID) | ORAL | Status: DC | PRN

## 2016-10-04 MED ORDER — ENOXAPARIN SODIUM 40 MG/0.4ML ~~LOC~~ SOLN
40.0000 mg | Freq: Two times a day (BID) | SUBCUTANEOUS | Status: DC
  Administered 2016-10-04 – 2016-10-07 (×7): 40 mg via SUBCUTANEOUS
  Filled 2016-10-04 (×7): qty 0.4

## 2016-10-04 MED ORDER — METOPROLOL TARTRATE 25 MG PO TABS
25.0000 mg | ORAL_TABLET | Freq: Two times a day (BID) | ORAL | Status: DC
  Administered 2016-10-05 – 2016-10-11 (×13): 25 mg via ORAL
  Filled 2016-10-04 (×14): qty 1

## 2016-10-04 NOTE — Progress Notes (Signed)
PHARMACIST - PHYSICIAN COMMUNICATION  CONCERNING:  Enoxaparin (Lovenox) for DVT Prophylaxis    RECOMMENDATION: Patient was prescribed enoxaprin 40mg  q24 hours for VTE prophylaxis.   Filed Weights   10/03/16 0710 10/03/16 1137  Weight: (!) 347 lb (157.4 kg) (!) 361 lb (163.7 kg)    Body mass index is 61.97 kg/m.  Estimated Creatinine Clearance: 111.7 mL/min (by C-G formula based on SCr of 0.75 mg/dL).  Based on [place], patient is candidate for enoxaparin 40mg  every 12 hour dosing due to BMI being >40.  DESCRIPTION: Pharmacy has adjusted enoxaparin dose per [place], approved through [place] committee.  Patient is now receiving enoxaparin 40mg  every 12 hours.   Nancy Fetter, PharmD Clinical Pharmacist  10/04/2016 9:26 AM

## 2016-10-04 NOTE — Progress Notes (Signed)
[place] at [place] NAME: Loretta Wade    MR#:  [id number]  DATE OF BIRTH:  [birth year]  SUBJECTIVE:  CHIEF COMPLAINT: pts sob is better , no wheezing ,Reporting that her pain    REVIEW OF SYSTEMS:  CONSTITUTIONAL: No fever, fatigue or weakness.  EYES: No blurred or double vision.  EARS, NOSE, AND THROAT: No tinnitus or ear pain.  RESPIRATORY: Reporting improved cough, shortness of breath, wheezing. Denies hemoptysis.  CARDIOVASCULAR: No chest pain, orthopnea, edema.  GASTROINTESTINAL: No nausea, vomiting, diarrhea or abdominal pain.  GENITOURINARY: No dysuria, hematuria.  ENDOCRINE: No polyuria, nocturia,  HEMATOLOGY: No anemia, easy bruising or bleeding SKIN: No rash or lesion. MUSCULOSKELETAL: No joint pain or arthritis.   NEUROLOGIC: No tingling, numbness, weakness.  PSYCHIATRY: No anxiety or depression.   DRUG ALLERGIES:  No Known Allergies  VITALS:  Blood pressure 124/60, pulse (!) 59, temperature 98.1 F (36.7 C), temperature source Oral, resp. rate 18, height 5\' 4"  (1.626 m), weight (!) 163.7 kg (361 lb), SpO2 97 %.  PHYSICAL EXAMINATION:  GENERAL:  66 y.o.-year-old patient lying in the bed with no acute distress.  EYES: Pupils equal, round, reactive to light and accommodation. No scleral icterus. Extraocular muscles intact.  HEENT: Head atraumatic, normocephalic. Oropharynx and nasopharynx clear.  NECK:  Supple, no jugular venous distention. No thyroid enlargement, no tenderness.  LUNGS: Moderate breath sounds bilaterally, no wheezing, rales,rhonchi or crepitation. No use of accessory muscles of respiration.  CARDIOVASCULAR: S1, S2 normal. No murmurs, rubs, or gallops.  ABDOMEN: Soft, nontender, nondistended. Bowel sounds present. No organomegaly or mass.  EXTREMITIES: No pedal edema, cyanosis, or clubbing.  NEUROLOGIC: Cranial nerves II through XII are intact. Muscle strength 5/5 in all extremities. Sensation  intact. Gait not checked.  PSYCHIATRIC: The patient is alert and oriented x 3.  SKIN: No obvious rash, lesion, or ulcer.    LABORATORY PANEL:   CBC  Recent Labs Lab 10/04/16 0431  WBC 8.6  HGB 11.6*  HCT 35.6  PLT 206   ------------------------------------------------------------------------------------------------------------------  Chemistries   Recent Labs Lab 10/04/16 0431  NA 139  K 4.4  CL 102  CO2 32  GLUCOSE 176*  BUN 14  CREATININE 0.75  CALCIUM 9.4   ------------------------------------------------------------------------------------------------------------------  Cardiac Enzymes  Recent Labs Lab 10/03/16 0718  TROPONINI <0.03   ------------------------------------------------------------------------------------------------------------------  RADIOLOGY:  Dg Chest 2 View  Result Date: 10/03/2016 CLINICAL DATA:  Shortness of breath with wheezing EXAM: CHEST  2 VIEW COMPARISON:  April 09, 2016 FINDINGS: There is no appreciable edema or consolidation. Heart is upper normal in size with pulmonary vascularity within normal limits. No adenopathy. There is aortic atherosclerosis. No evident bone lesions. IMPRESSION: No edema or consolidation. Stable cardiac silhouette. There is aortic atherosclerosis. Aortic Atherosclerosis (ICD10-I70.0). Electronically Signed   By: Lowella Grip III M.D.   On: 10/03/2016 07:43    EKG:   Orders placed or performed during the hospital encounter of 10/03/16  . EKG 12-Lead  . EKG 12-Lead  . ED EKG within 10 minutes  . ED EKG within 10 minutes    ASSESSMENT AND PLAN:    [name]  is a 66 y.o. female with a known history of COPD, lives on 2 L of oxygen via nasal cannula, fibromyalgia has been having shortness of breath for 2 weeks which has been progressively getting worse associated with cough and wheezing.    # Acute on chronic hypoxic respiratory failure secondary to COPD exacerbation  continue  Solu-Medrol tapering Is Nebulizer treatments with duo nebs every 6 hours and albuterol as needed Azithromycin Sputum culture and sensitivity is ordered if sputum sample can be collected  throat culture and sensitivity   check ambulatory pulse ox. Out of bed     #Hypertension Consider to change home medication propranolol to metoprolol tomorrow  #Hyperlipidemia continue WelChol  #GERD continue Protonix and Pepcid home medications  #Anxiety continue home medication Celexa  #Frequent urination-urine cultures no growth  DVT prophylaxis with Lovenox subcutaneous    All the records are reviewed and case discussed with Care Management/Social Workerr. Management plans discussed with the patient, family and they are in agreement.  CODE STATUS: fc  TOTAL TIME TAKING CARE OF THIS PATIENT: 36 minutes.   POSSIBLE D/C IN 1-2 DAYS, DEPENDING ON CLINICAL CONDITION.  Note: This dictation was prepared with Dragon dictation along with smaller phrase technology. Any transcriptional errors that result from this process are unintentional.   Nicholes Mango M.D on 10/04/2016 at 2:12 PM  Between 7am to 6pm - Pager - [phone] After 6pm go to [email or web address] - password [email or web address] [facility]  Office  [phone]  CC: Primary care physician; Patient, No Pcp Per

## 2016-10-05 LAB — TROPONIN I

## 2016-10-05 LAB — HIV ANTIBODY (ROUTINE TESTING W REFLEX): HIV SCREEN 4TH GENERATION: NONREACTIVE

## 2016-10-05 MED ORDER — GUAIFENESIN ER 600 MG PO TB12
600.0000 mg | ORAL_TABLET | Freq: Two times a day (BID) | ORAL | Status: DC
  Administered 2016-10-05 – 2016-10-11 (×13): 600 mg via ORAL
  Filled 2016-10-05 (×13): qty 1

## 2016-10-05 MED ORDER — NITROGLYCERIN 0.4 MG SL SUBL
0.4000 mg | SUBLINGUAL_TABLET | SUBLINGUAL | Status: DC | PRN
  Administered 2016-10-05 (×3): 0.4 mg via SUBLINGUAL

## 2016-10-05 MED ORDER — NITROGLYCERIN 0.4 MG SL SUBL
SUBLINGUAL_TABLET | SUBLINGUAL | Status: AC
  Administered 2016-10-05: 0.4 mg via SUBLINGUAL
  Filled 2016-10-05: qty 3

## 2016-10-05 MED ORDER — FUROSEMIDE 10 MG/ML IJ SOLN
20.0000 mg | Freq: Two times a day (BID) | INTRAMUSCULAR | Status: DC
  Administered 2016-10-05 – 2016-10-09 (×8): 20 mg via INTRAVENOUS
  Filled 2016-10-05 (×8): qty 2

## 2016-10-05 MED ORDER — ONDANSETRON HCL 4 MG/2ML IJ SOLN
4.0000 mg | Freq: Four times a day (QID) | INTRAMUSCULAR | Status: DC | PRN
  Administered 2016-10-05: 04:00:00 4 mg via INTRAVENOUS

## 2016-10-05 MED ORDER — ONDANSETRON HCL 4 MG/2ML IJ SOLN
INTRAMUSCULAR | Status: AC
  Filled 2016-10-05: qty 2

## 2016-10-05 MED ORDER — ASPIRIN 81 MG PO CHEW
81.0000 mg | CHEWABLE_TABLET | Freq: Every day | ORAL | Status: DC
  Administered 2016-10-05 – 2016-10-07 (×5): 81 mg via ORAL
  Filled 2016-10-05 (×3): qty 1

## 2016-10-05 MED ORDER — BUDESONIDE 0.25 MG/2ML IN SUSP
0.2500 mg | Freq: Two times a day (BID) | RESPIRATORY_TRACT | Status: DC
  Administered 2016-10-05 – 2016-10-11 (×12): 0.25 mg via RESPIRATORY_TRACT
  Filled 2016-10-05 (×12): qty 2

## 2016-10-05 MED ORDER — ASPIRIN 81 MG PO CHEW
CHEWABLE_TABLET | ORAL | Status: AC
  Administered 2016-10-05: 81 mg via ORAL
  Filled 2016-10-05: qty 1

## 2016-10-05 MED ORDER — SODIUM CHLORIDE 0.9% FLUSH
3.0000 mL | Freq: Two times a day (BID) | INTRAVENOUS | Status: DC
  Administered 2016-10-05 – 2016-10-11 (×12): 3 mL via INTRAVENOUS

## 2016-10-05 NOTE — Progress Notes (Signed)
[place] at [place] NAME: Loretta Wade    MR#:  [id number]  DATE OF BIRTH:  28-Dec-1950  SUBJECTIVE:  CHIEF COMPLAINT: Continues to complain of shortness of breath and wheezing  REVIEW OF SYSTEMS:  CONSTITUTIONAL: No fever, fatigue or weakness.  EYES: No blurred or double vision.  EARS, NOSE, AND THROAT: No tinnitus or ear pain.  RESPIRATORY: Reporting improved cough, Positive shortness of breath, positive wheezing. Denies hemoptysis.  CARDIOVASCULAR: No chest pain, orthopnea, positive edema.  GASTROINTESTINAL: No nausea, vomiting, diarrhea or abdominal pain.  GENITOURINARY: No dysuria, hematuria.  ENDOCRINE: No polyuria, nocturia,  HEMATOLOGY: No anemia, easy bruising or bleeding SKIN: No rash or lesion. MUSCULOSKELETAL: No joint pain or arthritis.   NEUROLOGIC: No tingling, numbness, weakness.  PSYCHIATRY: No anxiety or depression.   DRUG ALLERGIES:  No Known Allergies  VITALS:  Blood pressure (!) 135/59, pulse 76, temperature 98.5 F (36.9 C), temperature source Oral, resp. rate 18, height 5\' 4"  (1.626 m), weight (!) 361 lb (163.7 kg), SpO2 97 %.  PHYSICAL EXAMINATION:  GENERAL:  66 y.o.-year-old patient lying in the bed with no acute distress.  EYES: Pupils equal, round, reactive to light and accommodation. No scleral icterus. Extraocular muscles intact.  HEENT: Head atraumatic, normocephalic. Oropharynx and nasopharynx clear.  NECK:  Supple, no jugular venous distention. No thyroid enlargement, no tenderness.  LUNGS: Decreased breath sounds bilaterally no wheezing, rales,rhonchi or crepitation. No use of accessory muscles of respiration.  CARDIOVASCULAR: S1, S2 normal. No murmurs, rubs, or gallops.  ABDOMEN: Soft, nontender, nondistended. Bowel sounds present. No organomegaly or mass.  EXTREMITIES: 2+ pedal edema, cyanosis, or clubbing.  NEUROLOGIC: Cranial nerves II through XII are intact. Muscle strength 5/5 in all  extremities. Sensation intact. Gait not checked.  PSYCHIATRIC: The patient is alert and oriented x 3.  SKIN: No obvious rash, lesion, or ulcer.    LABORATORY PANEL:   CBC  Recent Labs Lab 10/04/16 0431  WBC 8.6  HGB 11.6*  HCT 35.6  PLT 206   ------------------------------------------------------------------------------------------------------------------  Chemistries   Recent Labs Lab 10/04/16 0431  NA 139  K 4.4  CL 102  CO2 32  GLUCOSE 176*  BUN 14  CREATININE 0.75  CALCIUM 9.4   ------------------------------------------------------------------------------------------------------------------  Cardiac Enzymes  Recent Labs Lab 10/05/16 0944  TROPONINI <0.03   ------------------------------------------------------------------------------------------------------------------  RADIOLOGY:  No results found.  EKG:   Orders placed or performed during the hospital encounter of 10/03/16  . EKG 12-Lead  . EKG 12-Lead  . ED EKG within 10 minutes  . ED EKG within 10 minutes  . EKG 12-Lead  . EKG 12-Lead    ASSESSMENT AND PLAN:    Loretta Wade  is a 66 y.o. female with a known history of COPD, lives on 2 L of oxygen via nasal cannula, fibromyalgia has been having shortness of breath for 2 weeks which has been progressively getting worse associated with cough and wheezing.    # Acute on chronic hypoxic respiratory failure secondary to COPD exacerbation continue Solu-Medrol Still symptomatic Nebulizer treatments with duo nebs every 6 hours and albuterol as needed Azithromycin Add Mucinex Pulmicort nebs We will treat IV Lasix   #Hypertension Consider to change home medication propranolol to metoprolol tomorrow  #Hyperlipidemia continue WelChol  #GERD continue Protonix and Pepcid home medications  #Anxiety continue home medication Celexa  #Frequent urination-urine cultures no growth  DVT prophylaxis with Lovenox subcutaneous    All  the records are reviewed and  case discussed with Care Management/Social Workerr. Management plans discussed with the patient, family and they are in agreement.  CODE STATUS: fc  TOTAL TIME TAKING CARE OF THIS PATIENT: 32 minutes.   POSSIBLE D/C IN 1-2 DAYS, DEPENDING ON CLINICAL CONDITION.  Note: This dictation was prepared with Dragon dictation along with smaller phrase technology. Any transcriptional errors that result from this process are unintentional.   Dustin Flock M.D on 10/05/2016 at 1:25 PM  Between 7am to 6pm - Pager - [phone] After 6pm go to [email or web address] - password [email or web address] [facility]  Office  [phone]  CC: Primary care physician; Patient, No Pcp Per

## 2016-10-05 NOTE — Progress Notes (Signed)
Patient c/o nausea and chest pain. Prime doc contacted for orders.

## 2016-10-05 NOTE — Evaluation (Signed)
Physical Therapy Evaluation Patient Details Name: Loretta Wade MRN: [id number] DOB: 1950-03-12 Today's Date: 10/05/2016   History of Present Illness  Pt is a 66 yo F, admitted to acute care on 7/31 with COPD exacerbation. Prior to admission, pt modI with ADL's, requiring RW for amb and 24/7 2L supplemental O2. PMH: anemia, CHF, edema, fibromyalgia, HTN, nocturia, and OA.   Clinical Impression  Pt is pleasant and willing to participate for return to PLOF. Pt performs bed mobility with MinA and tranfers with MaxA +2, due to impaired strength, balance, endurance, and reports of dizziness and nausea with erect posture. Amb not performed, due to safety concerns. Pt was primarily limited by weakness and dizziness during today's session. Pt on 4L supplemental O2 throughout duration of session, maintaining sat >90%. Pt would benefit from skilled PT to address the previously mentioned impairments and promote return to PLOF. Based on the previously mentioned impairments, currently recommending SNF, pending d/c.     Follow Up Recommendations SNF    Equipment Recommendations  Rolling walker with 5" wheels    Recommendations for Other Services       Precautions / Restrictions Precautions Precautions: Fall Restrictions Weight Bearing Restrictions: No      Mobility  Bed Mobility Overal bed mobility: Needs Assistance Bed Mobility: Supine to Sit;Sit to Supine     Supine to sit: Min assist Sit to supine: Min assist   General bed mobility comments: Pt minA for supine to sit and sit to supine, requiring mod verbal cues for mechanics and safety. HOB elevated and use of L bed rail 8/10 dizziness when seated at EOB. On 4L supplemental O2, with sats maintained >90%  Transfers Overall transfer level: Needs assistance Equipment used: Rolling walker (2 wheeled) Transfers: Sit to/from Stand Sit to Stand: Max assist;+2 physical assistance         General transfer comment: Max +2 for STS,  requiring mod verbal cues for safety and mechancis. Pt with increased SOB and dizziness upon standing. On 4L supplemental O2, with sats maintained >90%  Ambulation/Gait             General Gait Details: Not assessed due to safety concerns, as pt was highly weak and dizzy upon standing.  Stairs            Wheelchair Mobility    Modified Rankin (Stroke Patients Only)       Balance Overall balance assessment: Needs assistance Sitting-balance support: Bilateral upper extremity supported;Feet supported Sitting balance-[name] Scale: Fair Sitting balance - Comments: CGA with sitting at EOB, due to dizziness with erect posture. Notable increase in postural sway upon sitting.    Standing balance support: Bilateral upper extremity supported Standing balance-[name] Scale: Poor Standing balance comment: Poor standing balance, requiring maxA +2 and B UE support from RW. Pt with imapired strength to B LE's, increased dizziness, and increased postural sway.                              Pertinent Vitals/Pain Pain Assessment: Faces Faces Pain Scale: Hurts a little bit Pain Location: Chest (states it is pain from COPD that she has been having 3 weeks) Pain Descriptors / Indicators: Pins and needles Pain Intervention(s): Limited activity within patient's tolerance;Monitored during session;Repositioned    Home Living Family/patient expects to be discharged to:: Private residence Living Arrangements: Alone Available Help at Discharge: Family Type of Home: Apartment Home Access: Level entry     Home  Layout: One level Home Equipment: Walker - 2 wheels (Reports RW breaking 3 weeks ago.)      Prior Function Level of Independence: Independent with assistive device(s)         Comments: Amb household distances with use of RW. Baseline 2L supplemental O2.      Hand Dominance        Extremity/Trunk Assessment   Upper Extremity Assessment Upper Extremity Assessment:  Generalized weakness (MMT B UE's grossly 4-/5)    Lower Extremity Assessment Lower Extremity Assessment: Generalized weakness (MMT to B LE's grossly 3+/5)       Communication   Communication: No difficulties  Cognition Arousal/Alertness: Awake/alert Behavior During Therapy: WFL for tasks assessed/performed Overall Cognitive Status: Within Functional Limits for tasks assessed                                        General Comments      Exercises Other Exercises Other Exercises: Supine therex performed to B LE's with supervision x10 reps: ankle pumps, quad sets, glute sets, SLR (required minA on R LE and ModA on L LE), and hip abd.    Assessment/Plan    PT Assessment Patient needs continued PT services  PT Problem List Decreased strength;Decreased activity tolerance;Decreased balance;Decreased mobility;Decreased coordination;Decreased safety awareness;Cardiopulmonary status limiting activity;Pain;Obesity       PT Treatment Interventions DME instruction;Gait training;Functional mobility training;Therapeutic activities;Therapeutic exercise;Balance training;Neuromuscular re-education;Patient/family education;Manual techniques    PT Goals (Current goals can be found in the Care Plan section)  Acute Rehab PT Goals Patient Stated Goal: to get stronger PT Goal Formulation: With patient Time For Goal Achievement: 10/19/16 Potential to Achieve Goals: Good    Frequency Min 2X/week   Barriers to discharge        Co-evaluation               AM-PAC PT "6 Clicks" Daily Activity  Outcome Measure Difficulty turning over in bed (including adjusting bedclothes, sheets and blankets)?: Total Difficulty moving from lying on back to sitting on the side of the bed? : Total Difficulty sitting down on and standing up from a chair with arms (e.g., wheelchair, bedside commode, etc,.)?: Total Help needed moving to and from a bed to chair (including a wheelchair)?: A  Lot Help needed walking in hospital room?: Total Help needed climbing 3-5 steps with a railing? : Total 6 Click Score: 7    End of Session Equipment Utilized During Treatment: Oxygen;Gait belt Activity Tolerance: Patient limited by fatigue Patient left: in bed;with call bell/phone within reach;with bed alarm set Nurse Communication: Mobility status PT Visit Diagnosis: Unsteadiness on feet (R26.81);Other abnormalities of gait and mobility (R26.89);Muscle weakness (generalized) (M62.81);Pain    Time: 6568-1275 PT Time Calculation (min) (ACUTE ONLY): 33 min   Charges:         PT G Codes:        Oran Rein PT, SPT  Bevelyn Ngo 10/05/2016, 2:52 PM

## 2016-10-06 ENCOUNTER — Inpatient Hospital Stay: Payer: Medicare PPO

## 2016-10-06 LAB — BASIC METABOLIC PANEL
Anion gap: 8 (ref 5–15)
BUN: 20 mg/dL (ref 6–20)
CALCIUM: 9.4 mg/dL (ref 8.9–10.3)
CHLORIDE: 97 mmol/L — AB (ref 101–111)
CO2: 36 mmol/L — AB (ref 22–32)
CREATININE: 0.73 mg/dL (ref 0.44–1.00)
GFR calc non Af Amer: >60 mL/min (ref >60)
Glucose, Bld: 137 mg/dL — ABNORMAL HIGH (ref 65–99)
Potassium: 4.6 mmol/L (ref 3.5–5.1)
Sodium: 141 mmol/L (ref 135–145)

## 2016-10-06 LAB — AMMONIA: Ammonia: 22 umol/L (ref 9–35)

## 2016-10-06 MED ORDER — CIPROFLOXACIN-DEXAMETHASONE 0.3-0.1 % OT SUSP
4.0000 [drp] | Freq: Two times a day (BID) | OTIC | Status: DC
  Administered 2016-10-06 – 2016-10-11 (×10): 4 [drp] via OTIC
  Filled 2016-10-06: qty 7.5

## 2016-10-06 MED ORDER — IOPAMIDOL (ISOVUE-370) INJECTION 76%
100.0000 mL | Freq: Once | INTRAVENOUS | Status: AC | PRN
  Administered 2016-10-06: 12:00:00 100 mL via INTRAVENOUS

## 2016-10-06 NOTE — Progress Notes (Signed)
[place] [facility] [place] NAME: Loretta Wade    MR#:  [id number]  DATE OF BIRTH:  [birth year]  SUBJECTIVE:  Continues to complain a short shortness of breath as well as has multiple complaints including ringing in the left ear, States that she is tremulous states that she is tremulous and wants her ammonia level checked Was noted to have a large bowel movement with some bright red blood in her stool earlier today states that   REVIEW OF SYSTEMS:  CONSTITUTIONAL: No fever, fatigue or weakness.  EYES: No blurred or double vision.  EARS, NOSE, AND THROAT: No tinnitus or ear pain.  RESPIRATORY: Positive cough , Positive shortness of breath, positive wheezing. Denies hemoptysis.  CARDIOVASCULAR: No chest pain, orthopnea, positive edema.  GASTROINTESTINAL: No nausea, vomiting, diarrhea or abdominal pain.  GENITOURINARY: No dysuria, hematuria.  ENDOCRINE: No polyuria, nocturia,  HEMATOLOGY: No anemia, easy bruising or bleeding SKIN: No rash or lesion. MUSCULOSKELETAL: No joint pain or arthritis.   NEUROLOGIC: No tingling, numbness, weakness.  PSYCHIATRY: No anxiety or depression.   DRUG ALLERGIES:  No Known Allergies  VITALS:  Blood pressure (!) 104/53, pulse 72, temperature 97.7 F (36.5 C), temperature source Oral, resp. rate 20, height 5\' 4"  (1.626 m), weight (!) 361 lb (163.7 kg), SpO2 94 %.  PHYSICAL EXAMINATION:  GENERAL:  66 y.o.-year-old patient lying in the bed with no acute distress. Morbidly obese EYES: Pupils equal, round, reactive to light and accommodation. No scleral icterus. Extraocular muscles intact.  HEENT: Head atraumatic, normocephalic. Oropharynx and nasopharynx clear.  NECK:  Supple, no jugular venous distention. No thyroid enlargement, no tenderness.  LUNGS: Decreased breath sounds bilaterally no wheezing, rales,rhonchi or crepitation. No use of accessory muscles of respiration.  CARDIOVASCULAR: S1, S2 normal. No  murmurs, rubs, or gallops.  ABDOMEN: Soft, nontender, nondistended. Bowel sounds present. No organomegaly or mass.  EXTREMITIES: 2+ pedal edema, cyanosis, or clubbing.  NEUROLOGIC: Cranial nerves II through XII are intact. Muscle strength 5/5 in all extremities. Sensation intact. Gait not checked.  PSYCHIATRIC: The patient is alert and oriented x 3.  SKIN: No obvious rash, lesion, or ulcer.    LABORATORY PANEL:   CBC  Recent Labs Lab 10/04/16 0431  WBC 8.6  HGB 11.6*  HCT 35.6  PLT 206   ------------------------------------------------------------------------------------------------------------------  Chemistries   Recent Labs Lab 10/06/16 0344  NA 141  K 4.6  CL 97*  CO2 36*  GLUCOSE 137*  BUN 20  CREATININE 0.73  CALCIUM 9.4   ------------------------------------------------------------------------------------------------------------------  Cardiac Enzymes  Recent Labs Lab 10/05/16 1601  TROPONINI <0.03   ------------------------------------------------------------------------------------------------------------------  RADIOLOGY:  No results found.  EKG:   Orders placed or performed during the hospital encounter of 10/03/16  . EKG 12-Lead  . EKG 12-Lead  . ED EKG within 10 minutes  . ED EKG within 10 minutes  . EKG 12-Lead  . EKG 12-Lead    ASSESSMENT AND PLAN:    Loretta Wade  is a 66 y.o. female with a known history of COPD, lives on 2 L of oxygen via nasal cannula, fibromyalgia has been having shortness of breath for 2 weeks which has been progressively getting worse associated with cough and wheezing.    # Acute on chronic hypoxic respiratory failure secondary to COPD exacerbation continue Solu-Medrol Still Very symptomatic I will obtain a CT scan of the chest to rule out pulmonary embolism as well as evaluate for further pulmonary processes Nebulizer treatments with duo  nebs every 6 hours and albuterol as needed Continue  Azithromycin Continue Mucinex Continue Pulmicort nebs Continue IV Lasix seems to be responding   #Hypertension Continue propranolol  #Hyperlipidemia continue WelChol  #GERD continue Protonix and Pepcid home medications  #Anxiety continue home medication Celexa  #Frequent urination-urine cultures no growth  DVT prophylaxis with Lovenox subcutaneous    All the records are reviewed and case discussed with Care Management/Social Workerr. Management plans discussed with the patient, family and they are in agreement.  CODE STATUS: fc  TOTAL TIME TAKING CARE OF THIS PATIENT: 32 minutes.   POSSIBLE D/C IN 1-2 DAYS, DEPENDING ON CLINICAL CONDITION.  Note: This dictation was prepared with Dragon dictation along with smaller phrase technology. Any transcriptional errors that result from this process are unintentional.   Loretta Wade M.D on 10/06/2016 at 12:34 PM  Between 7am to 6pm - Pager - [phone] After 6pm go to [email or web address] - password [email or web address] [facility]  Office  [phone]  CC: Primary care physician; Patient, No Pcp Per

## 2016-10-06 NOTE — Care Management Important Message (Signed)
Important Message  Patient Details  Name: Loretta Wade MRN: [id number] Date of Birth: 01-Feb-1951   Medicare Important Message Given:  Yes    Shelbie Ammons, RN 10/06/2016, 8:06 AM

## 2016-10-06 NOTE — NC FL2 (Addendum)
[place] MEDICAID FL2 LEVEL OF CARE SCREENING TOOL     IDENTIFICATION  Patient Name: Loretta Wade Birthdate: [birth year] Sex: female Admission Date (Current Location): 10/03/2016  [place] and [place] Number:  Chiropodist and Address:  [facility], [address] [place], [place] [address]      Provider Number: [id number]  Attending Physician Name and Address:  Auburn Bilberry, MD  Relative Name and Phone Number:       Current Level of Care: Hospital Recommended Level of Care: Skilled Nursing Facility Prior Approval Number:    Date Approved/Denied:   PASRR Number:  ([id number] )  Discharge Plan: SNF    Current Diagnoses: Patient Active Problem List   Diagnosis Date Noted  . Candidal intertrigo 02/08/2016  . Dysuria 02/08/2016  . Requires assistance with activities of daily living (ADL) 02/08/2016  . Oral thrush 11/10/2015  . Tachypnea 11/01/2015  . Acute non-recurrent maxillary sinusitis 10/29/2015  . Fall 07/11/2015  . Dyslipidemia 06/17/2015  . Overflow stress incontinence of urine in female 06/17/2015  . Chronic pain 06/10/2015  . Chronic low back pain (Location of Tertiary source of pain) (Bilateral) (R>L) 06/10/2015  . Chronic shoulder pain (Location of Secondary source of pain) (Left) 06/10/2015  . Costochondritis (Location of Primary Source of Pain) 06/10/2015  . Long term current use of opiate analgesic 06/10/2015  . Long term prescription opiate use 06/10/2015  . Opiate use 06/10/2015  . Encounter for therapeutic drug level monitoring 06/10/2015  . Encounter for pain management planning 06/10/2015  . Chronic feet pain (Bilateral) (R>L) 06/10/2015  . Osteoarthritis 06/10/2015  . Osteoarthritis of shoulder (Left) 06/10/2015  . COPD exacerbation (HCC) 04/12/2015  . Poor mobility 04/12/2015  . Fibromyalgia 03/23/2015  . GERD (gastroesophageal reflux disease) 03/23/2015  . MRSA infection 11/17/2014  . Diastolic  CHF, acute on chronic (HCC) 11/12/2014  . Morbid obesity with BMI of 50.0-59.9, adult (HCC) 11/12/2014  . Neck pain, chronic 11/12/2014  . COPD (chronic obstructive pulmonary disease) (HCC) 11/04/2014  . Urinary tract infection 10/02/2014    Orientation RESPIRATION BLADDER Height & Weight     Self, Time, Situation, Place  Normal Continent Weight: (!) 361 lb (163.7 kg) Height:  5\' 4"  (162.6 cm)  BEHAVIORAL SYMPTOMS/MOOD NEUROLOGICAL BOWEL NUTRITION STATUS   (none)  (none) Continent Diet (Diet: Heart Healthy )  AMBULATORY STATUS COMMUNICATION OF NEEDS Skin   Extensive Assist Verbally Normal                       Personal Care Assistance Level of Assistance  Bathing, Feeding, Dressing Bathing Assistance: Limited assistance Feeding assistance: Independent Dressing Assistance: Limited assistance     Functional Limitations Info  Sight, Hearing, Speech Sight Info: Adequate Hearing Info: Adequate Speech Info: Adequate    SPECIAL CARE FACTORS FREQUENCY  PT (By licensed PT), OT (By licensed OT)     PT Frequency:  (5) OT Frequency:  (5)            Contractures      Additional Factors Info  Code Status, Allergies, Isolation Precautions Code Status Info:  (Full Code. ) Allergies Info:  (No Known Allergies. )     Isolation Precautions Info:  (History of MRSA )     Current Medications (10/06/2016):  This is the current hospital active medication list Current Facility-Administered Medications  Medication Dose Route Frequency Provider Last Rate Last Dose  . acetaminophen (TYLENOL) tablet 650 mg  650 mg Oral Q6H  PRN Ramonita Lab, MD      . diphenhydrAMINE (BENADRYL) capsule 25 mg  25 mg Oral Daily PRN Jeydi Klingel, MD       And  . phenylephrine (SUDAFED PE) tablet 10 mg  10 mg Oral Daily PRN Kamarri Lovvorn, MD       And  . acetaminophen (TYLENOL) tablet 650 mg  650 mg Oral Daily PRN Mikki Ziff, MD      . albuterol (PROVENTIL) (2.5 MG/3ML) 0.083% nebulizer solution 2.5  mg  2.5 mg Nebulization Q6H PRN Aronda Burford, MD      . ALPRAZolam Prudy Feeler) tablet 0.5 mg  0.5 mg Oral Daily PRN Jeanluc Wegman, MD      . aspirin chewable tablet 81 mg  81 mg Oral Daily Pyreddy, Vivien Rota, MD   81 mg at 10/06/16 0955  . budesonide (PULMICORT) nebulizer solution 0.25 mg  0.25 mg Nebulization BID Auburn Bilberry, MD   0.25 mg at 10/06/16 0902  . chlorpheniramine-HYDROcodone (TUSSIONEX) 10-8 MG/5ML suspension 5 mL  5 mL Oral Q12H PRN Kalem Rockwell, MD   5 mL at 10/05/16 0142  . cholecalciferol (VITAMIN D) tablet 1,000 Units  1,000 Units Oral Daily Ramonita Lab, MD   1,000 Units at 10/06/16 0955  . ciprofloxacin-dexamethasone (CIPRODEX) 0.3-0.1 % OTIC (EAR) suspension 4 drop  4 drop Left EAR BID Auburn Bilberry, MD   4 drop at 10/06/16 1317  . citalopram (CELEXA) tablet 20 mg  20 mg Oral Daily Angeleigh Chiasson, MD   20 mg at 10/06/16 0954  . colesevelam [facility]) tablet 625 mg  625 mg Oral Q breakfast Gershon Shorten, MD   625 mg at 10/06/16 0956  . docusate sodium (COLACE) capsule 100 mg  100 mg Oral BID Clarabelle Oscarson, MD   100 mg at 10/06/16 0955  . enoxaparin (LOVENOX) injection 40 mg  40 mg Subcutaneous Q12H Hallaji, Sheema M, RPH   40 mg at 10/06/16 0955  . famotidine (PEPCID) tablet 20 mg  20 mg Oral BID Alesandra Smart, MD   20 mg at 10/06/16 0954  . fluticasone (FLONASE) 50 MCG/ACT nasal spray 2 spray  2 spray Each Nare Daily Kynnadi Dicenso, MD   2 spray at 10/06/16 0958  . furosemide (LASIX) injection 20 mg  20 mg Intravenous Q12H Auburn Bilberry, MD   20 mg at 10/06/16 0528  . guaiFENesin (MUCINEX) 12 hr tablet 600 mg  600 mg Oral BID Auburn Bilberry, MD   600 mg at 10/06/16 0954  . guaiFENesin-dextromethorphan (ROBITUSSIN DM) 100-10 MG/5ML syrup 5-10 mL  5-10 mL Oral Q4H PRN Angelin Cutrone, MD   10 mL at 10/04/16 0943  . HYDROcodone-acetaminophen (NORCO/VICODIN) 5-325 MG per tablet 1 tablet  1 tablet Oral Q6H PRN Traevon Meiring, MD   1 tablet at 10/06/16 1316  . ipratropium-albuterol (DUONEB)  0.5-2.5 (3) MG/3ML nebulizer solution 3 mL  3 mL Nebulization Q4H PRN Kjersten Ormiston, MD   3 mL at 10/05/16 0254  . loperamide (IMODIUM) capsule 2-4 mg  2-4 mg Oral PRN Kazia Grisanti, MD      . magnesium oxide (MAG-OX) tablet 400 mg  400 mg Oral Daily Larin Weissberg, MD   400 mg at 10/06/16 0954  . methylPREDNISolone sodium succinate (SOLU-MEDROL) 125 mg/2 mL injection 60 mg  60 mg Intravenous Q8H Jayme Cham, MD   60 mg at 10/06/16 1317  . metoprolol tartrate (LOPRESSOR) tablet 25 mg  25 mg Oral BID Ramonita Lab, MD   25 mg at 10/06/16 0954  . nitroGLYCERIN (  NITROSTAT) SL tablet 0.4 mg  0.4 mg Sublingual Q5 min PRN Ihor Austin, MD   0.4 mg at 10/05/16 0421  . nystatin (MYCOSTATIN) 100000 UNIT/ML suspension 500,000 Units  5 mL Oral QID Fredderick Swanger, MD   500,000 Units at 10/06/16 1317  . nystatin ointment (MYCOSTATIN) 1 application  1 application Topical TID Ramonita Lab, MD   1 application at 10/06/16 0957  . ondansetron (ZOFRAN) injection 4 mg  4 mg Intravenous Q6H PRN Ihor Austin, MD   4 mg at 10/05/16 0409  . oxybutynin (DITROPAN) tablet 5 mg  5 mg Oral Daily Esdras Delair, MD   5 mg at 10/06/16 0954  . pantoprazole (PROTONIX) EC tablet 40 mg  40 mg Oral BID Ramonita Lab, MD   40 mg at 10/06/16 0955  . pregabalin (LYRICA) capsule 150 mg  150 mg Oral BID Trudy Kory, MD   150 mg at 10/06/16 0955  . rOPINIRole (REQUIP) tablet 0.75 mg  0.75 mg Oral QHS Parneet Glantz, MD   0.25 mg at 10/05/16 2158  . sodium chloride flush (NS) 0.9 % injection 3 mL  3 mL Intravenous Q12H Auburn Bilberry, MD   3 mL at 10/06/16 0959  . umeclidinium-vilanterol (ANORO ELLIPTA) 62.5-25 MCG/INH 1 puff  1 puff Inhalation Daily Amillion Scobee, MD   1 puff at 10/06/16 0956  . vitamin B-12 (CYANOCOBALAMIN) tablet 500 mcg  500 mcg Oral Daily Danecia Underdown, MD   500 mcg at 10/06/16 5284     Discharge Medications: Please see discharge summary for a list of discharge medications.  Relevant Imaging Results:  Relevant Lab  Results:   Additional Information  (SSN: [id number])  Sample, Darleen Crocker, LCSW

## 2016-10-07 ENCOUNTER — Inpatient Hospital Stay
Admit: 2016-10-07 | Discharge: 2016-10-07 | Disposition: A | Discharge status: Home / Self Care | Payer: Medicare PPO | Attending: Internal Medicine | Admitting: Internal Medicine

## 2016-10-07 ENCOUNTER — Inpatient Hospital Stay: Payer: Medicare PPO

## 2016-10-07 LAB — BASIC METABOLIC PANEL
Anion gap: 9 (ref 5–15)
BUN: 21 mg/dL — AB (ref 6–20)
CHLORIDE: 95 mmol/L — AB (ref 101–111)
CO2: 36 mmol/L — AB (ref 22–32)
CREATININE: 0.82 mg/dL (ref 0.44–1.00)
Calcium: 9 mg/dL (ref 8.9–10.3)
GFR calc Af Amer: >60 mL/min (ref >60)
GFR calc non Af Amer: >60 mL/min (ref >60)
Glucose, Bld: 152 mg/dL — ABNORMAL HIGH (ref 65–99)
Potassium: 4.4 mmol/L (ref 3.5–5.1)
SODIUM: 140 mmol/L (ref 135–145)

## 2016-10-07 LAB — CBC
HEMATOCRIT: 38.1 % (ref 35.0–47.0)
HEMOGLOBIN: 12.4 g/dL (ref 12.0–16.0)
MCH: 31.8 pg (ref 26.0–34.0)
MCHC: 32.6 g/dL (ref 32.0–36.0)
MCV: 97.3 fL (ref 80.0–100.0)
Platelets: 224 10*3/uL (ref 150–440)
RBC: 3.91 MIL/uL (ref 3.80–5.20)
RDW: 14.1 % (ref 11.5–14.5)
WBC: 8.1 10*3/uL (ref 3.6–11.0)

## 2016-10-07 MED ORDER — ORAL CARE MOUTH RINSE
15.0000 mL | Freq: Two times a day (BID) | OROMUCOSAL | Status: DC
  Administered 2016-10-07 – 2016-10-10 (×7): 15 mL via OROMUCOSAL

## 2016-10-07 MED ORDER — NYSTATIN 100000 UNIT/GM EX POWD
Freq: Three times a day (TID) | CUTANEOUS | Status: DC
  Administered 2016-10-07 – 2016-10-11 (×13): via TOPICAL
  Filled 2016-10-07 (×2): qty 15

## 2016-10-07 NOTE — Progress Notes (Signed)
[place] [facility] [place] NAME: Loretta Wade    MR#:  [id number]  DATE OF BIRTH:  May 17, 1950  SUBJECTIVE:  Continues to complain a short shortness of breathAnd wheezing as well as has multiple complaints including frontal headache and tingling and numbness in her hands. Concerned about stroke. Reporting ringing in the left ear, associated with headache in the frontal area. She wants to make sure she does not have a stroke Was noted to have a large bowel movement with some bright red blood in her stool yesterday morning and today morning According to RN patient has passed bedside swallow evaluation  REVIEW OF SYSTEMS:  CONSTITUTIONAL: No fever, fatigue or weakness.  EYES: No blurred or double vision.  EARS, NOSE, AND THROAT: No tinnitus or ear pain.  RESPIRATORY: Positive cough , Positive shortness of breath, positive wheezing. Denies hemoptysis.  CARDIOVASCULAR: No chest pain, orthopnea, positive edema.  GASTROINTESTINAL: No nausea, vomiting, diarrhea or abdominal pain. Reporting blood in stool 2 GENITOURINARY: No dysuria, hematuria.  ENDOCRINE: No polyuria, nocturia,  HEMATOLOGY: No anemia, easy bruising . SKIN: No rash or lesion. MUSCULOSKELETAL: No joint pain or arthritis.   NEUROLOGIC: No tingling, numbness, weakness.  PSYCHIATRY: No anxiety or depression.   DRUG ALLERGIES:  No Known Allergies  VITALS:  Blood pressure (!) 115/56, pulse (!) 56, temperature 98 F (36.7 C), resp. rate (!) 21, height 5\' 4"  (1.626 m), weight (!) 163.7 kg (361 lb), SpO2 93 %.  PHYSICAL EXAMINATION:  GENERAL:  66 y.o.-year-old patient lying in the bed with no acute distress. Morbidly obese EYES: Pupils equal, round, reactive to light and accommodation. No scleral icterus. Extraocular muscles intact.  HEENT: Head atraumatic, normocephalic. Oropharynx and nasopharynx clear.  NECK:  Supple, no jugular venous distention. No thyroid enlargement, no  tenderness.  LUNGS: Decreased breath sounds bilaterally,Minimal end expiratory wheezing, no rales,rhonchi or crepitation. No use of accessory muscles of respiration.  CARDIOVASCULAR: S1, S2 normal. No murmurs, rubs, or gallops.  ABDOMEN: Soft, nontender, nondistended. Bowel sounds present. No organomegaly or mass.  EXTREMITIES: 2+ pedal edema, cyanosis, or clubbing.  NEUROLOGIC: Cranial nerves II through XII are intact. Muscle strength 5/5 in all extremities. Sensation intact. Gait not checked. No pronator drift. No dysarthria PSYCHIATRIC: The patient is alert and oriented x 3.  SKIN: No obvious rash, lesion, or ulcer.    LABORATORY PANEL:   CBC  Recent Labs Lab 10/07/16 0506  WBC 8.1  HGB 12.4  HCT 38.1  PLT 224   ------------------------------------------------------------------------------------------------------------------  Chemistries   Recent Labs Lab 10/07/16 0506  NA 140  K 4.4  CL 95*  CO2 36*  GLUCOSE 152*  BUN 21*  CREATININE 0.82  CALCIUM 9.0   ------------------------------------------------------------------------------------------------------------------  Cardiac Enzymes  Recent Labs Lab 10/05/16 1601  TROPONINI <0.03   ------------------------------------------------------------------------------------------------------------------  RADIOLOGY:  Ct Angio Chest Pe W Or Wo Contrast  Result Date: 10/06/2016 CLINICAL DATA:  Chronic dyspnea.  Negative chest x-ray. EXAM: CT ANGIOGRAPHY CHEST WITH CONTRAST TECHNIQUE: Multidetector CT imaging of the chest was performed using the standard protocol during bolus administration of intravenous contrast. Multiplanar CT image reconstructions and MIPs were obtained to evaluate the vascular anatomy. CONTRAST:  100 cc Isovue 370 intravenous COMPARISON:  10/03/2016 chest x-ray FINDINGS: Cardiovascular: Suboptimal visualization of the pulmonary arteries, combination of factors including patient body habitus, bolus  dispersion, and intermittent motion. No visible pulmonary embolism to the lobar/proximal segmental level, beyond which there is particularly poor visualization. Borderline cardiomegaly. Atherosclerotic calcification  of the aortic arch. No evidence of acute aortic disease. Mediastinum/Nodes: Negative for adenopathy. Lungs/Pleura: Low volumes with subsegmental atelectasis dependently. There is no edema, consolidation, effusion, or pneumothorax. Upper Abdomen: No acute finding. Known, partly seen left renal cyst. Cholecystectomy. Musculoskeletal: No acute or aggressive finding Review of the MIP images confirms the above findings. IMPRESSION: 1. Limited pulmonary artery opacification, especially beyond the lobar/segmental levels, partially due to patient body habitus. No evidence of pulmonary embolism. 2. Low lung volumes with subsegmental atelectasis. Electronically Signed   By: Monte Fantasia M.D.   On: 10/06/2016 11:56    EKG:   Orders placed or performed during the hospital encounter of 10/03/16  . EKG 12-Lead  . EKG 12-Lead  . ED EKG within 10 minutes  . ED EKG within 10 minutes  . EKG 12-Lead  . EKG 12-Lead    ASSESSMENT AND PLAN:    Loretta Wade  is a 66 y.o. female with a known history of COPD, lives on 2 L of oxygen via nasal cannula, fibromyalgia has been having shortness of breath for 2 weeks which has been progressively getting worse associated with cough and wheezing.   #GI bleed Patient's NA noticed blood 2 in the stool We'll check stool for Hemoccult Monitor hemoglobin and hematocrit and transfuse as needed PPI, GI consult  #Headache with tingling and numbness of extremities Patient has passed bedside swallow evaluation Will get CT head, carotid Dopplers and 2-D echocardiogram Neuro checks and neurology consult PT and OT consult Hold aspirin in view of GI bleed for now    # Acute on chronic hypoxic respiratory failure secondary to COPD exacerbation continue  Solu-Medrol Still Very symptomatic CT chest is negative for pulmonary embolism INebulizer treatments with duo nebs every 6 hours and albuterol as needed Continue Azithromycin Continue Mucinex Continue Pulmicort nebs Continue IV Lasix seems to be responding Incentive spirometry Encourage out of bed as tolerated   #Hypertension Continue propranolol  #Hyperlipidemia continue WelChol  #GERD continue Protonix and Pepcid home medications  #Anxiety continue home medication Celexa  #Frequent urination-urine cultures no growth  DVT prophylaxis with Lovenox subcutaneous    All the records are reviewed and case discussed with Care Management/Social Workerr. Management plans discussed with the patient,and she is  in agreement.  CODE STATUS: fc  TOTAL TIME TAKING CARE OF THIS PATIENT: 36 minutes.   POSSIBLE D/C IN 1-2 DAYS, DEPENDING ON CLINICAL CONDITION.  Note: This dictation was prepared with Dragon dictation along with smaller phrase technology. Any transcriptional errors that result from this process are unintentional.   Nicholes Mango M.D on 10/07/2016 at 11:47 AM  Between 7am to 6pm - Pager - [phone] After 6pm go to [email or web address] - password [email or web address] [facility]  Office  [phone]  CC: Primary care physician; Patient, No Pcp Per

## 2016-10-07 NOTE — Consult Note (Signed)
Referring Provider: Dr. Margaretmary Eddy Primary Care Physician:  Patient, No Pcp Per Primary Gastroenterologist:  Dr. Vira Agar  Reason for Consultation:  Rectal bleeding  HPI: Loretta Wade is a 66 y.o. female with multiple medical problems in the hospital for a COPD exacerbation seen for a consult due to an episode of hematochezia yesterday. Had a brown stool this morning with specks of dark red and another brown stool without any bleeding. Reports chronic diffuse abdominal pain for weeks that hurts when she touches it on something. No known triggers for the abdominal pain that she reports has been constant for weeks. Thinks she had a colonoscopy 5 years ago but records not available at this time. Nurse at bedside.  Past Medical History:  Diagnosis Date  . Anemia   . CHF (congestive heart failure) ([place])   . COPD (chronic obstructive pulmonary disease) ([facility])    on 2L home o2  . Edema   . Esophageal reflux   . Fatigue   . Fibromyalgia    Following with pain management  . Hypertension   . Nocturia   . OA (osteoarthritis)   . Sleep apnea    not on CPAP  - Morbid obesity  Past Surgical History:  Procedure Laterality Date  . ABDOMINAL HYSTERECTOMY    . CHOLECYSTECTOMY    . TOTAL KNEE ARTHROPLASTY     right  . VESICOVAGINAL FISTULA CLOSURE W/ TAH      Prior to Admission medications   Medication Sig Start Date End Date Taking? Authorizing Provider  acetaminophen (TYLENOL) 325 MG tablet Take 2 tablets (650 mg total) by mouth every 6 (six) hours as needed for mild pain (or Fever >/= 101). 11/17/14  Yes Gladstone Lighter, MD  albuterol (PROVENTIL) (2.5 MG/3ML) 0.083% nebulizer solution USE ONE TREATMENT EVERY 6 HOURS AS NEEDED FOR WHEEZING OR SHORTNESS OF BREATH 05/16/16  Yes Roselee Nova, MD  ALPRAZolam Duanne Moron) 0.5 MG tablet Take 1 tablet (0.5 mg total) by mouth daily as needed for anxiety. 11/30/15  Yes Keith Rake Asad A, MD  ANORO ELLIPTA 62.5-25 MCG/INH AEPB Inhale 1 puff into the  lungs daily. 10/06/15  Yes [provider]  Chlorphen-Phenyleph-APAP (CORICIDIN D COLD/FLU/SINUS) 2-5-325 MG TABS Take 2 tablets by mouth daily as needed (for nasal congestion/cough).   Yes [provider]  chlorpheniramine-HYDROcodone (TUSSIONEX) 10-8 MG/5ML SUER Take 5 mLs by mouth every 12 (twelve) hours as needed for cough. 11/04/15  Yes Demetrios Loll, MD  cholecalciferol (VITAMIN D) 1000 UNITS tablet Take 1,000 Units by mouth daily.    Yes [provider]  citalopram (CELEXA) 20 MG tablet TAKE ONE (1) TABLET BY MOUTH EVERY DAY 05/16/16  Yes Roselee Nova, MD  colesevelam [facility]) 625 MG tablet TAKE ONE TABLET TWICE A DAY WITH FOOD 05/16/16  Yes Roselee Nova, MD  cyanocobalamin 500 MCG tablet Take 500 mcg by mouth daily.   Yes [provider]  dexlansoprazole (DEXILANT) 60 MG capsule TAKE ONE (1) CAPSULE EACH DAY 05/16/16  Yes Roselee Nova, MD  fluticasone [facility]) 50 MCG/ACT nasal spray USE TWO SPRAYS IN EACH NOSTRIL EVERY DAY 07/06/16  Yes Sowles, Drue Stager, MD  furosemide (LASIX) 40 MG tablet Take 1 tablet (40 mg total) by mouth 2 (two) times daily. 02/08/15  Yes Dustin Flock, MD  guaiFENesin-dextromethorphan [facility]) 100-10 MG/5ML syrup Take 5-10 mLs by mouth every 4 (four) hours as needed for cough.    Yes [provider]  loperamide (IMODIUM) 2 MG  capsule Take 2-4 mg by mouth as needed for diarrhea or loose stools.   Yes [provider]  Magnesium 250 MG TABS Take 250 mg by mouth daily.   Yes [provider]  nystatin (MYCOSTATIN) 100000 UNIT/ML suspension Take 5 mLs (500,000 Units total) by mouth 4 (four) times daily. 11/10/15  Yes Roselee Nova, MD  nystatin ointment (MYCOSTATIN) Apply 1 application topically 3 (three) times daily. 02/25/16  Yes Sowles, Drue Stager, MD  oxybutynin (DITROPAN) 5 MG tablet TAKE ONE (1) TABLET BY MOUTH EVERY DAY 05/16/16  Yes Keith Rake Asad A, MD  pantoprazole (PROTONIX) 40 MG tablet  TAKE ONE (1) TABLET BY MOUTH TWO (2) TIMES DAILY 05/16/16  Yes Roselee Nova, MD  predniSONE (DELTASONE) 10 MG tablet 20 mg po daily for 2 days, 10 mg po daily for 2 days. 11/04/15  Yes Demetrios Loll, MD  pregabalin (LYRICA) 150 MG capsule Take 1 capsule (150 mg total) by mouth 2 (two) times daily. 05/16/16  Yes Roselee Nova, MD  propranolol ER (INDERAL LA) 80 MG 24 hr capsule TAKE ONE CAPSULE BY MOUTH DAILY 06/06/16  Yes Roselee Nova, MD  ranitidine (ZANTAC) 150 MG tablet Take 1 tablet (150 mg total) by mouth 2 (two) times daily. 05/16/16  Yes Roselee Nova, MD  rOPINIRole (REQUIP) 0.25 MG tablet Take 3 tablets (0.75 mg total) by mouth at bedtime. 05/16/16  Yes Roselee Nova, MD  HYDROcodone-acetaminophen (NORCO/VICODIN) 5-325 MG tablet Take 1 tablet by mouth every 6 (six) hours as needed for moderate pain. Patient not taking: Reported on 10/03/2016 05/16/16   Roselee Nova, MD  rOPINIRole (REQUIP) 0.25 MG tablet TAKE THREE TABLETS BY MOUTH EVERY NIGHT AT BEDTIME Patient not taking: Reported on 10/03/2016 11/15/15   Roselee Nova, MD    Scheduled Meds: . budesonide (PULMICORT) nebulizer solution  0.25 mg Nebulization BID  . cholecalciferol  1,000 Units Oral Daily  . ciprofloxacin-dexamethasone  4 drop Left EAR BID  . citalopram  20 mg Oral Daily  . colesevelam  625 mg Oral Q breakfast  . docusate sodium  100 mg Oral BID  . famotidine  20 mg Oral BID  . fluticasone  2 spray Each Nare Daily  . furosemide  20 mg Intravenous Q12H  . guaiFENesin  600 mg Oral BID  . magnesium oxide  400 mg Oral Daily  . mouth rinse  15 mL Mouth Rinse BID  . methylPREDNISolone (SOLU-MEDROL) injection  60 mg Intravenous Q8H  . metoprolol tartrate  25 mg Oral BID  . nystatin  5 mL Oral QID  . nystatin   Topical TID  . nystatin ointment  1 application Topical TID  . oxybutynin  5 mg Oral Daily  . pantoprazole  40 mg Oral BID  . pregabalin  150 mg Oral BID  . rOPINIRole  0.75 mg Oral QHS  .  sodium chloride flush  3 mL Intravenous Q12H  . umeclidinium-vilanterol  1 puff Inhalation Daily  . cyanocobalamin  500 mcg Oral Daily   Continuous Infusions: PRN Meds:.acetaminophen, diphenhydrAMINE **AND** phenylephrine **AND** acetaminophen, albuterol, ALPRAZolam, chlorpheniramine-HYDROcodone, guaiFENesin-dextromethorphan, HYDROcodone-acetaminophen, ipratropium-albuterol, loperamide, nitroGLYCERIN, ondansetron (ZOFRAN) IV  Allergies as of 10/03/2016  . (No Known Allergies)    Family History  Problem Relation Age of Onset  . Hypertension Mother   . Diabetes Unknown   . Breast cancer Daughter     Social History   Social History  . Marital status:  Single    Spouse name: N/A  . Number of children: N/A  . Years of education: N/A   Occupational History  . Not on file.   Social History Main Topics  . Smoking status: Former Smoker    Quit date: 11/04/1983  . Smokeless tobacco: Never Used     Comment: quit several years ago - almost 30 years ago  . Alcohol use No  . Drug use: No  . Sexual activity: Not on file   Other Topics Concern  . Not on file   Social History Narrative   From [facility] assisted [facility].   Has a walker and wheelchair at baseline.    Review of Systems: All negative except as stated above in HPI.  Physical Exam: Vital signs: Vitals:   10/07/16 1226 10/07/16 1558  BP: 132/60 134/64  Pulse: 68 69  Resp: 20 16  Temp: 98.5 F (36.9 C) 98.1 F (36.7 C)   Last BM Date: 10/07/16 General:   Lethargic, morbidly obese, no acute distress, pleasant HEENT: anicteric sclera, oropharynx clear Lungs:  Coarse breath sounds Heart:  Regular rate and rhythm; no murmurs, clicks, rubs,  or gallops. Abdomen: diffuse tenderness with guarding, soft, nondistended, +BS  Rectal:  Deferred Ext: no edema  GI:  Lab Results:  Recent Labs  10/07/16 0506  WBC 8.1  HGB 12.4  HCT 38.1  PLT 224   BMET  Recent Labs  10/06/16 0344 10/07/16 0506  NA 141  140  K 4.6 4.4  CL 97* 95*  CO2 36* 36*  GLUCOSE 137* 152*  BUN 20 21*  CREATININE 0.73 0.82  CALCIUM 9.4 9.0   LFT No results for input(s): PROT, ALBUMIN, AST, ALT, ALKPHOS, BILITOT, BILIDIR, IBILI in the last 72 hours. PT/INR No results for input(s): LABPROT, INR in the last 72 hours.   Studies/Results: Ct Head Wo Contrast  Result Date: 10/07/2016 CLINICAL DATA:  Right frontal headaches for 4 days. Recent CTA examination. EXAM: CT HEAD WITHOUT CONTRAST TECHNIQUE: Contiguous axial images were obtained from the base of the skull through the vertex without intravenous contrast. COMPARISON:  05/13/2015 FINDINGS: Brain: No evidence for acute hemorrhage, mass lesion, midline shift, hydrocephalus or large infarct. Vascular: No hyperdense vessel or unexpected calcification. Skull: Normal. Negative for fracture or focal lesion. Sinuses/Orbits: Polyp or retention cyst along the roof of the right maxillary sinus. Question a small amount of fluid in the right mastoid air cells. These sinus findings are similar to the previous examination. Other: None. IMPRESSION: No acute intracranial abnormality. Stable mild sinus disease. Electronically Signed   By: Markus Daft M.D.   On: 10/07/2016 12:20   Ct Angio Chest Pe W Or Wo Contrast  Result Date: 10/06/2016 CLINICAL DATA:  Chronic dyspnea.  Negative chest x-ray. EXAM: CT ANGIOGRAPHY CHEST WITH CONTRAST TECHNIQUE: Multidetector CT imaging of the chest was performed using the standard protocol during bolus administration of intravenous contrast. Multiplanar CT image reconstructions and MIPs were obtained to evaluate the vascular anatomy. CONTRAST:  100 cc Isovue 370 intravenous COMPARISON:  10/03/2016 chest x-ray FINDINGS: Cardiovascular: Suboptimal visualization of the pulmonary arteries, combination of factors including patient body habitus, bolus dispersion, and intermittent motion. No visible pulmonary embolism to the lobar/proximal segmental level, beyond  which there is particularly poor visualization. Borderline cardiomegaly. Atherosclerotic calcification of the aortic arch. No evidence of acute aortic disease. Mediastinum/Nodes: Negative for adenopathy. Lungs/Pleura: Low volumes with subsegmental atelectasis dependently. There is no edema, consolidation, effusion, or pneumothorax. Upper Abdomen: No acute  finding. Known, partly seen left renal cyst. Cholecystectomy. Musculoskeletal: No acute or aggressive finding Review of the MIP images confirms the above findings. IMPRESSION: 1. Limited pulmonary artery opacification, especially beyond the lobar/segmental levels, partially due to patient body habitus. No evidence of pulmonary embolism. 2. Low lung volumes with subsegmental atelectasis. Electronically Signed   By: Monte Fantasia M.D.   On: 10/06/2016 11:56   US Carotid Bilateral  Result Date: 10/07/2016 CLINICAL DATA:  66 year old female with numbness and tingling in both hands EXAM: BILATERAL CAROTID DUPLEX ULTRASOUND TECHNIQUE: Pearline Cables scale imaging, color Doppler and duplex ultrasound were performed of bilateral carotid and vertebral arteries in the neck. COMPARISON:  Head CT obtained earlier today FINDINGS: Criteria: Quantification of carotid stenosis is based on velocity parameters that correlate the residual internal carotid diameter with NASCET-based stenosis levels, using the diameter of the distal internal carotid lumen as the denominator for stenosis measurement. The following velocity measurements were obtained: RIGHT ICA:  123/33 cm/sec CCA:  845/36 cm/sec SYSTOLIC ICA/CCA RATIO:  1.0 DIASTOLIC ICA/CCA RATIO:  3.2 ECA:  142 cm/sec LEFT ICA:  109/33 cm/sec CCA:  468/03 cm/sec SYSTOLIC ICA/CCA RATIO:  1.0 DIASTOLIC ICA/CCA RATIO:  2.1 ECA:  105 cm/sec RIGHT CAROTID ARTERY: Highly tortuous internal carotid artery anatomy. No significant plaque or evidence of stenosis. RIGHT VERTEBRAL ARTERY:  Patent with normal antegrade flow. LEFT CAROTID ARTERY: No  significant atherosclerotic plaque or evidence of stenosis. LEFT VERTEBRAL ARTERY:  Patent with normal antegrade flow. IMPRESSION: Negative bilateral carotid duplex ultrasound. Electronically Signed   By: Jacqulynn Cadet M.D.   On: 10/07/2016 15:00    Impression/Plan: Rectal bleeding that appears to have resolved and probably due to a rectal outlet source (hemorrhoids). Hgb 12.4 (11.6 in 10/04/16). Would hold off on an inpatient colonoscopy unless bleeding returns and persists. Abdominal pain is chronic and will need further management as an outpt unless it worsens. Please set patient to f/u with Dr. Vira Agar or one of his partners in 4-6 weeks to decide on timing of a repeat colonoscopy. Will sign off. Call us back if questions.    LOS: 4 days   Geary C.  10/07/2016, 4:05 PM

## 2016-10-07 NOTE — Clinical Social Work Note (Signed)
Clinical Social Work Assessment  Patient Details  Name: Loretta Wade MRN: [id number] Date of Birth: Mar 24, 1950  Date of referral:  10/07/16               Reason for consult:  Facility Placement                Permission sought to share information with:  Facility Art therapist granted to share information::  Yes, Release of Information Signed  Name::        Agency::     Relationship::     Contact Information:     Housing/Transportation Living arrangements for the past 2 months:  Apartment Source of Information:  Patient, Medical Team Patient Interpreter Needed:  None Criminal Activity/Legal Involvement Pertinent to Current Situation/Hospitalization:  No - Comment as needed Significant Relationships:  Adult Children, Other Family Members, Siblings Lives with:  Self Do you feel safe going back to the place where you live?  Yes Need for family participation in patient care:  No (Coment)  Care giving concerns:  PT recommendation for STR   Social Worker assessment / plan:  CSW met with the patient at bedside to discuss discharge planning. The patient gave verbal permission to conduct the STR referral, and she named [facility] as her preference.  At baseline, the patient lives alone in an apartment and has support from family. The patient has multiple medical problems; however, she seems to be in good spirits.  The CSW advised the patient that [facility] has not made a bed offer. The patient has chosen [facility] as they have made an offer. The patient will most likely discharge Monday and will require EMS with a bariatric stretcher.  Employment status:  Retired Nurse, adult PT Recommendations:  [place] / Referral to community resources:  [place]  Patient/Family's Response to care:  The patient thanked the CSW for being candid and treating her with dignity.  Patient/Family's  Understanding of and Emotional Response to Diagnosis, Current Treatment, and Prognosis:  The patient understands the need for SNF level care and is in agreement. The patient is disappointed that her first choice for SNF is not available, and she is aware that she can decide to leave [facility] should she find the care lacking.  Emotional Assessment Appearance:  Appears stated age Attitude/Demeanor/Rapport:   (Pleasant and alert) Affect (typically observed):  Appropriate, Pleasant Orientation:  Oriented to Self, Oriented to Place, Oriented to  Time, Oriented to Situation Alcohol / Substance use:  Never Used Psych involvement (Current and /or in the community):  No (Comment)  Discharge Needs  Concerns to be addressed:  Care Coordination, Discharge Planning Concerns Readmission within the last 30 days:  No Current discharge risk:  Chronically ill Barriers to Discharge:  Continued Medical Work up   [employer], LCSW 10/07/2016, 4:36 PM

## 2016-10-07 NOTE — Plan of Care (Signed)
Problem: Education: Goal: Knowledge of disease or condition will improve Outcome: Progressing Stroke booklet given to patient

## 2016-10-07 NOTE — Progress Notes (Signed)
Dr Margaretmary Eddy notified that patient reports burning when urinating. MD acknolwedged, no new orders given.

## 2016-10-08 DIAGNOSIS — R202 Paresthesia of skin: Secondary | ICD-10-CM

## 2016-10-08 DIAGNOSIS — R200 Anesthesia of skin: Secondary | ICD-10-CM

## 2016-10-08 LAB — ECHOCARDIOGRAM COMPLETE
Height: 64 in
Weight: 5776 oz

## 2016-10-08 LAB — CULTURE, BLOOD (ROUTINE X 2)
Culture: NO GROWTH
Culture: NO GROWTH
Special Requests: ADEQUATE
Special Requests: ADEQUATE

## 2016-10-08 MED ORDER — ASPIRIN EC 81 MG PO TBEC
81.0000 mg | DELAYED_RELEASE_TABLET | Freq: Every day | ORAL | Status: DC
  Administered 2016-10-08 – 2016-10-10 (×3): 81 mg via ORAL
  Filled 2016-10-08 (×3): qty 1

## 2016-10-08 MED ORDER — ENOXAPARIN SODIUM 40 MG/0.4ML ~~LOC~~ SOLN
40.0000 mg | Freq: Two times a day (BID) | SUBCUTANEOUS | Status: DC
  Administered 2016-10-08 – 2016-10-11 (×7): 40 mg via SUBCUTANEOUS
  Filled 2016-10-08 (×7): qty 0.4

## 2016-10-08 NOTE — Progress Notes (Signed)
Neurology   66 y.o. femalewith a known history of COPD, lives on 2 L of oxygen via nasal cannula presenting with COPD exacerbation.  Neurology called for ? TIA described as tingling in pt's f.   Pt has tingling in her finger tips and has chronic contractures of 2nd and 3rd digits on both hands.   Numbness comes and goes.    This is not a stroke/TIA as b/l extremities as well as periodic numbness. CTH no acute abnormality. No need for any further work up Out pt potential tendon release for contracture.  Loretta Wade

## 2016-10-08 NOTE — Progress Notes (Signed)
[place] [facility] [place] NAME: Loretta Wade    MR#:  [id number]  DATE OF BIRTH:  1951-03-02  SUBJECTIVE:  Shortness of breath and breathing wheezing are better headache is resolved  Denies any tinnitus or tingling in her hands today  No other episodes of blood in her stool  According to RN patient has passed bedside swallow evaluation  REVIEW OF SYSTEMS:  CONSTITUTIONAL: No fever, fatigue or weakness.  EYES: No blurred or double vision.  EARS, NOSE, AND THROAT: No tinnitus or ear pain.  RESPIRATORY: Improving cough, shortness of breath and wheezing CARDIOVASCULAR: No chest pain, orthopnea, positive edema.  GASTROINTESTINAL: No nausea, vomiting, diarrhea or abdominal pain. GENITOURINARY: No dysuria, hematuria.  ENDOCRINE: No polyuria, nocturia,  HEMATOLOGY: No anemia, easy bruising . SKIN: No rash or lesion. MUSCULOSKELETAL: No joint pain or arthritis.   NEUROLOGIC: No tingling, numbness, weakness.  PSYCHIATRY: No anxiety or depression.   DRUG ALLERGIES:  No Known Allergies  VITALS:  Blood pressure (!) 148/73, pulse 82, temperature 98.3 F (36.8 C), temperature source Oral, resp. rate 20, height 5\' 4"  (1.626 m), weight (!) 163.7 kg (361 lb), SpO2 97 %.  PHYSICAL EXAMINATION:  GENERAL:  66 y.o.-year-old patient lying in the bed with no acute distress. Morbidly obese EYES: Pupils equal, round, reactive to light and accommodation. No scleral icterus. Extraocular muscles intact.  HEENT: Head atraumatic, normocephalic. Oropharynx and nasopharynx clear.  NECK:  Supple, no jugular venous distention. No thyroid enlargement, no tenderness.  LUNGS: Moderate breath sounds bilaterally,Minimal end expiratory wheezing which is chronic  no rales,rhonchi or crepitation. No use of accessory muscles of respiration.  CARDIOVASCULAR: S1, S2 normal. No murmurs, rubs, or gallops.  ABDOMEN: Soft, nontender, nondistended. Bowel sounds present. No  organomegaly or mass.  EXTREMITIES: 2+ pedal edema, cyanosis, or clubbing.  NEUROLOGIC: Cranial nerves II through XII are intact. Muscle strength 5/5 in all extremities. Sensation intact. Gait not checked. No pronator drift. No dysarthria PSYCHIATRIC: The patient is alert and oriented x 3.  SKIN: No obvious rash, lesion, or ulcer.    LABORATORY PANEL:   CBC  Recent Labs Lab 10/07/16 0506  WBC 8.1  HGB 12.4  HCT 38.1  PLT 224   ------------------------------------------------------------------------------------------------------------------  Chemistries   Recent Labs Lab 10/07/16 0506  NA 140  K 4.4  CL 95*  CO2 36*  GLUCOSE 152*  BUN 21*  CREATININE 0.82  CALCIUM 9.0   ------------------------------------------------------------------------------------------------------------------  Cardiac Enzymes  Recent Labs Lab 10/05/16 1601  TROPONINI <0.03   ------------------------------------------------------------------------------------------------------------------  RADIOLOGY:  Ct Head Wo Contrast  Result Date: 10/07/2016 CLINICAL DATA:  Right frontal headaches for 4 days. Recent CTA examination. EXAM: CT HEAD WITHOUT CONTRAST TECHNIQUE: Contiguous axial images were obtained from the base of the skull through the vertex without intravenous contrast. COMPARISON:  05/13/2015 FINDINGS: Brain: No evidence for acute hemorrhage, mass lesion, midline shift, hydrocephalus or large infarct. Vascular: No hyperdense vessel or unexpected calcification. Skull: Normal. Negative for fracture or focal lesion. Sinuses/Orbits: Polyp or retention cyst along the roof of the right maxillary sinus. Question a small amount of fluid in the right mastoid air cells. These sinus findings are similar to the previous examination. Other: None. IMPRESSION: No acute intracranial abnormality. Stable mild sinus disease. Electronically Signed   By: Markus Daft M.D.   On: 10/07/2016 12:20   US Carotid  Bilateral  Result Date: 10/07/2016 CLINICAL DATA:  66 year old female with numbness and tingling in both hands EXAM: BILATERAL  CAROTID DUPLEX ULTRASOUND TECHNIQUE: Pearline Cables scale imaging, color Doppler and duplex ultrasound were performed of bilateral carotid and vertebral arteries in the neck. COMPARISON:  Head CT obtained earlier today FINDINGS: Criteria: Quantification of carotid stenosis is based on velocity parameters that correlate the residual internal carotid diameter with NASCET-based stenosis levels, using the diameter of the distal internal carotid lumen as the denominator for stenosis measurement. The following velocity measurements were obtained: RIGHT ICA:  123/33 cm/sec CCA:  237/62 cm/sec SYSTOLIC ICA/CCA RATIO:  1.0 DIASTOLIC ICA/CCA RATIO:  3.2 ECA:  142 cm/sec LEFT ICA:  109/33 cm/sec CCA:  831/51 cm/sec SYSTOLIC ICA/CCA RATIO:  1.0 DIASTOLIC ICA/CCA RATIO:  2.1 ECA:  105 cm/sec RIGHT CAROTID ARTERY: Highly tortuous internal carotid artery anatomy. No significant plaque or evidence of stenosis. RIGHT VERTEBRAL ARTERY:  Patent with normal antegrade flow. LEFT CAROTID ARTERY: No significant atherosclerotic plaque or evidence of stenosis. LEFT VERTEBRAL ARTERY:  Patent with normal antegrade flow. IMPRESSION: Negative bilateral carotid duplex ultrasound. Electronically Signed   By: Jacqulynn Cadet M.D.   On: 10/07/2016 15:00    EKG:   Orders placed or performed during the hospital encounter of 10/03/16  . EKG 12-Lead  . EKG 12-Lead  . ED EKG within 10 minutes  . ED EKG within 10 minutes  . EKG 12-Lead  . EKG 12-Lead    ASSESSMENT AND PLAN:    Loretta Wade  is a 66 y.o. female with a known history of COPD, lives on 2 L of oxygen via nasal cannula, fibromyalgia has been having shortness of breath for 2 weeks which has been progressively getting worse associated with cough and wheezing.   #GI bleed-Probably from internal hemorrhoids No other episodes of bleeding Hemoglobin stable  at 12.4 GI is not considering any procedures as patient is not actively bleeding and hemodynamically stable Ordered stool for Hemoccult Monitor hemoglobin and hematocrit and transfuse as needed PPI, outpatient GI follow-up with Dr. Vira Agar in 4-6 weeks  #Headache with tingling and numbness of extremities Patient has passed bedside swallow evaluation Headache resolved Negative CT head, carotid Dopplers  Follow-up 2-D echocardiogram Neuro checks  Appreciate neurology recommendations . Stroke ruled out. Outpatient potential tendon release for contractures is advised PT and OT consult Resume aspirin which is her home medication   # Acute on chronic hypoxic respiratory failure secondary to COPD exacerbation continue Solu-Medrol Still Very symptomatic CT chest is negative for pulmonary embolism INebulizer treatments with duo nebs every 6 hours and albuterol as needed Continue Azithromycin Continue Mucinex Continue Pulmicort nebs Continue IV Lasix seems to be responding Incentive spirometry Encourage out of bed as tolerated   #Hypertension Continue propranolol  #Hyperlipidemia continue WelChol  #GERD continue Protonix and Pepcid home medications  #Anxiety continue home medication Celexa  #Frequent urination-urine cultures no growth  #Morbid obesity-out of bed to chair Lifestyle changes advised once patient is medically stable  DVT prophylaxis with Lovenox subcutaneous  Disposition SNF-follow-up with social worker for placement  All the records are reviewed and case discussed with Care Management/Social Workerr. Management plans discussed with the patient,and she is  in agreement.  CODE STATUS: fc  TOTAL TIME TAKING CARE OF THIS PATIENT: 36 minutes.   POSSIBLE D/C IN 1-2 DAYS, DEPENDING ON CLINICAL CONDITION.  Note: This dictation was prepared with Dragon dictation along with smaller phrase technology. Any transcriptional errors that result from this process  are unintentional.   Nicholes Mango M.D on 10/08/2016 at 12:40 PM  Between 7am to 6pm - Pager -  [phone] After 6pm go to [email or web address] - password [email or web address] [facility]  Office  [phone]  CC: Primary care physician; Patient, No Pcp Per

## 2016-10-09 LAB — OCCULT BLOOD X 1 CARD TO LAB, STOOL: FECAL OCCULT BLD: POSITIVE — AB

## 2016-10-09 MED ORDER — METHYLPREDNISOLONE SODIUM SUCC 40 MG IJ SOLR
40.0000 mg | Freq: Two times a day (BID) | INTRAMUSCULAR | Status: DC
  Administered 2016-10-09 – 2016-10-10 (×2): 40 mg via INTRAVENOUS
  Filled 2016-10-09 (×2): qty 1

## 2016-10-09 MED ORDER — FUROSEMIDE 40 MG PO TABS
40.0000 mg | ORAL_TABLET | Freq: Two times a day (BID) | ORAL | Status: DC
  Administered 2016-10-09 – 2016-10-11 (×5): 40 mg via ORAL
  Filled 2016-10-09 (×5): qty 1

## 2016-10-09 NOTE — Progress Notes (Signed)
Physical Therapy Treatment Patient Details Name: Loretta Wade MRN: [id number] DOB: 1950/05/06 Today's Date: 10/09/2016    History of Present Illness Pt is a 66 yo F, admitted to acute care on 7/31 with COPD exacerbation. Prior to admission, pt modI with ADL's, requiring RW for amb and 24/7 2L supplemental O2. PMH: anemia, CHF, edema, fibromyalgia, HTN, nocturia, and OA.     PT Comments    Pt agreeable to PT; reports 8/10 pain for chronic all over pain. Pt has recently received medication. Pt participates with supine bed exercises with assist as needed. Education in proper breathing techniques to facilitate work and avoid holding breath by exhaling on exertion. Pt requires several small rest breaks. O2 saturation 95% from 96% and heart rate 88 beats per minute from 73 during supine exercises. Pt fatigued post exercises and refuses up in bed/edge of bed sitting. Continue PT to progress endurance, strength to improve function and allow return to prior level, which includes ambulating short distances.   Follow Up Recommendations  SNF     Equipment Recommendations  Rolling walker with 5" wheels    Recommendations for Other Services       Precautions / Restrictions Precautions Precautions: Fall Restrictions Weight Bearing Restrictions: No    Mobility  Bed Mobility               General bed mobility comments: Not tested; refused edge of bed/up in bed post exercise due to fatigue  Transfers                    Ambulation/Gait                 Stairs            Wheelchair Mobility    Modified Rankin (Stroke Patients Only)       Balance                                            Cognition Arousal/Alertness: Awake/alert Behavior During Therapy: WFL for tasks assessed/performed Overall Cognitive Status: Within Functional Limits for tasks assessed                                        Exercises General  Exercises - Lower Extremity Ankle Circles/Pumps: AROM;Both;20 reps;Supine Quad Sets: Strengthening;Both;20 reps;Supine Gluteal Sets: Strengthening;Both;20 reps;Supine Short Arc Quad: AROM;Both;20 reps;Supine Heel Slides: AROM;Both;20 reps;Supine Hip ABduction/ADduction: AAROM;Both;20 reps;Supine Straight Leg Raises: AAROM;Both;20 reps;Supine (small range) Other Exercises Other Exercises: educated on exhaling with exertion to facilitate work and avoid holding breath     General Comments        Pertinent Vitals/Pain Pain Assessment: 0-10 Pain Score: 8  Pain Location: chronic whole body Pain Descriptors / Indicators: Aching;Constant Pain Intervention(s): Premedicated before session;Limited activity within patient's tolerance    Home Living                      Prior Function            PT Goals (current goals can now be found in the care plan section) Progress towards PT goals: Progressing toward goals    Frequency    Min 2X/week      PT Plan Current plan remains appropriate    Co-evaluation  AM-PAC PT "6 Clicks" Daily Activity  Outcome Measure  Difficulty turning over in bed (including adjusting bedclothes, sheets and blankets)?: Total Difficulty moving from lying on back to sitting on the side of the bed? : Total Difficulty sitting down on and standing up from a chair with arms (e.g., wheelchair, bedside commode, etc,.)?: Total Help needed moving to and from a bed to chair (including a wheelchair)?: Total Help needed walking in hospital room?: Total Help needed climbing 3-5 steps with a railing? : Total 6 Click Score: 6    End of Session Equipment Utilized During Treatment: Oxygen Activity Tolerance: Patient limited by fatigue Patient left: in bed;with call bell/phone within reach;with bed alarm set   PT Visit Diagnosis: Unsteadiness on feet (R26.81);Other abnormalities of gait and mobility (R26.89);Muscle weakness (generalized)  (M62.81);Pain     Time: 9371-6967 PT Time Calculation (min) (ACUTE ONLY): 25 min  Charges:  $Therapeutic Exercise: 23-37 mins                    G Codes:       Larae Grooms, PTA 10/09/2016, 3:38 PM

## 2016-10-09 NOTE — Care Management Important Message (Signed)
Important Message  Patient Details  Name: Loretta Wade MRN: [id number] Date of Birth: 1950-09-09   Medicare Important Message Given:  Yes    Shelbie Ammons, RN 10/09/2016, 9:06 AM

## 2016-10-09 NOTE — Clinical Social Work Note (Signed)
CSW notified [facility] that pt chose facility. [facility] started [place]. As of note, [facility] is pending. CSW updated MD. CSW will continue to follow.   [name], MSW, LCSW Clinical Social Worker [phone]

## 2016-10-09 NOTE — Progress Notes (Signed)
[place] at [place] NAME: Loretta Wade    MR#:  [id number]  DATE OF BIRTH:  Jul 31, 1950  SUBJECTIVE:  Shortness of breath and breathing wheezing are better,No new complaints feeling better  REVIEW OF SYSTEMS:  CONSTITUTIONAL: No fever, fatigue or weakness.  EYES: No blurred or double vision.  EARS, NOSE, AND THROAT: No tinnitus or ear pain.  RESPIRATORY: Improving cough, shortness of breath and wheezing CARDIOVASCULAR: No chest pain, orthopnea, positive edema.  GASTROINTESTINAL: No nausea, vomiting, diarrhea or abdominal pain. GENITOURINARY: No dysuria, hematuria.  ENDOCRINE: No polyuria, nocturia,  HEMATOLOGY: No anemia, easy bruising . SKIN: No rash or lesion. MUSCULOSKELETAL: No joint pain or arthritis.   NEUROLOGIC: No tingling, numbness, weakness.  PSYCHIATRY: No anxiety or depression.   DRUG ALLERGIES:  No Known Allergies  VITALS:  Blood pressure 131/75, pulse 73, temperature 98.7 F (37.1 C), temperature source Oral, resp. rate (!) 24, height 5\' 4"  (1.626 m), weight (!) 163.7 kg (361 lb), SpO2 96 %.  PHYSICAL EXAMINATION:  GENERAL:  66 y.o.-year-old patient lying in the bed with no acute distress. Morbidly obese EYES: Pupils equal, round, reactive to light and accommodation. No scleral icterus. Extraocular muscles intact.  HEENT: Head atraumatic, normocephalic. Oropharynx and nasopharynx clear.  NECK:  Supple, no jugular venous distention. No thyroid enlargement, no tenderness.  LUNGS: Moderate breath sounds bilaterally, No wheezing   no rales,rhonchi or crepitation. No use of accessory muscles of respiration.  CARDIOVASCULAR: S1, S2 normal. No murmurs, rubs, or gallops.  ABDOMEN: Soft, nontender, nondistended. Bowel sounds present. No organomegaly or mass.  EXTREMITIES: 1+ pedal edema, cyanosis, or clubbing.  NEUROLOGIC: Cranial nerves II through XII are intact. Muscle strength 5/5 in all extremities. Sensation  intact. Gait not checked. No pronator drift. No dysarthria PSYCHIATRIC: The patient is alert and oriented x 3.  SKIN: No obvious rash, lesion, or ulcer.    LABORATORY PANEL:   CBC  Recent Labs Lab 10/07/16 0506  WBC 8.1  HGB 12.4  HCT 38.1  PLT 224   ------------------------------------------------------------------------------------------------------------------  Chemistries   Recent Labs Lab 10/07/16 0506  NA 140  K 4.4  CL 95*  CO2 36*  GLUCOSE 152*  BUN 21*  CREATININE 0.82  CALCIUM 9.0   ------------------------------------------------------------------------------------------------------------------  Cardiac Enzymes  Recent Labs Lab 10/05/16 1601  TROPONINI <0.03   ------------------------------------------------------------------------------------------------------------------  RADIOLOGY:  No results found.  EKG:   Orders placed or performed during the hospital encounter of 10/03/16  . EKG 12-Lead  . EKG 12-Lead  . ED EKG within 10 minutes  . ED EKG within 10 minutes  . EKG 12-Lead  . EKG 12-Lead    ASSESSMENT AND PLAN:    Loretta Wade  is a 66 y.o. female with a known history of COPD, lives on 2 L of oxygen via nasal cannula, fibromyalgia has been having shortness of breath for 2 weeks which has been progressively getting worse associated with cough and wheezing.   #GI bleed-Probably from internal hemorrhoids No other episodes of bleeding GI is not considering any procedures as patient is not actively bleeding and hemodynamically stable Ordered stool for Hemoccult PPI, outpatient GI follow-up with Dr. Vira Agar in 4-6 weeks  #Headache with tingling and numbness of extremities Acute CVA ruled out with negative CT head Negative CT head, carotid Dopplers   2-D echocardiogram-55% ejection fraction no cardiac source of emboli Neuro checks -no neuro deficits and discontinue neuro checks  Appreciate neurology recommendations . Stroke ruled  out. Outpatient potential tendon release for contractures is advised PT and OT consult-skilled nursing facility  Resume aspirin which is her home medication   # Acute on chronic hypoxic respiratory failure secondary to COPD exacerbation Clinically improving taper IV Solu-Medrol and change to by mouth prednisone tomorrow  CT chest is negative for pulmonary embolism INebulizer treatments with duo nebs every 6 hours and albuterol as needed Continue Azithromycin Continue Mucinex Continue Pulmicort nebs Changed to by mouth Lasix Incentive spirometry Encourage out of bed as tolerated   #Hypertension Continue propranolol  #Hyperlipidemia continue WelChol  #GERD continue Protonix and Pepcid home medications  #Anxiety continue home medication Celexa  #Frequent urination-urine cultures no growth  #Morbid obesity-out of bed to chair Lifestyle changes advised once patient is medically stable  DVT prophylaxis with Lovenox subcutaneous  Disposition SNF-follow-up with social worker for placement  All the records are reviewed and case discussed with Care Management/Social Workerr. Management plans discussed with the patient,and she is  in agreement.  CODE STATUS: fc  TOTAL TIME TAKING CARE OF THIS PATIENT: 36 minutes.   POSSIBLE D/C IN 1-2 DAYS, DEPENDING ON CLINICAL CONDITION.  Note: This dictation was prepared with Dragon dictation along with smaller phrase technology. Any transcriptional errors that result from this process are unintentional.   Nicholes Mango M.D on 10/09/2016 at 4:01 PM  Between 7am to 6pm - Pager - [phone] After 6pm go to [email or web address] - password [email or web address] [facility]  Office  [phone]  CC: Primary care physician; Patient, No Pcp Per

## 2016-10-09 NOTE — Progress Notes (Addendum)
Per patient, she still feels some burning sensation when urinating. Also noted for some red streak on BM when perineal care rendered. Notified MD regarding burning urination.  Obtained an order from MD to collect urin sample for urinalysis. Urine obtained and sent to lab. Needs attended.

## 2016-10-09 NOTE — Evaluation (Signed)
Occupational Therapy Evaluation Patient Details Name: Loretta Wade MRN: [id number] DOB: 1950-06-05 Today's Date: 10/09/2016    History of Present Illness Pt is a 66 yo F, admitted to acute care on 7/31 with COPD exacerbation. Prior to admission, pt modI with ADL's, requiring RW for amb and 24/7 2L supplemental O2. PMH: anemia, CHF, edema, fibromyalgia, HTN, nocturia, and OA.    Clinical Impression   Pt seen for OT evaluation this date. Pt was living by herself, requiring 2WW for household mobility. Family assisted PRN with bathing, cooking, cleaning, groceries, and driving pt to appointments. Pt presents with SOB with exertion, poor activity tolerance, decreased strength, and increased assistance needed to perform self care tasks due to these impairments. Pt educated in pursed lip breathing with extensive verbal/visual cues and practice to support proper technique in order to minimize SOB and increase O2 sats after bed mobility (dropped to 92%, increased back up to 96% with proper PLB technique). Recommend STR following hospitalization to maximize return to PLOF and minimize risk of falls and increased caregiver burden.      Follow Up Recommendations  SNF    Equipment Recommendations  3 in 1 bedside commode;Tub/shower bench;Toilet rise with handles (bariatric)    Recommendations for Other Services       Precautions / Restrictions Precautions Precautions: Fall Restrictions Weight Bearing Restrictions: No      Mobility Bed Mobility Overal bed mobility: Needs Assistance Bed Mobility: Supine to Sit;Sit to Supine     Supine to sit: Supervision;HOB elevated Sit to supine: Supervision   General bed mobility comments: pt able to perform bed mobility with close supervision/SBA and VC for pursed lip breathing, with additional time/effort to perform and heavy reliance of bed rails and HOB elevated  Transfers                 General transfer comment: unsafe to attempt this  session    Balance Overall balance assessment: Needs assistance Sitting-balance support: Bilateral upper extremity supported;Feet supported Sitting balance-[name] Scale: Fair Sitting balance - Comments: close supervision for sitting EOB, noted slightly dizziness throughout session                                   ADL either performed or assessed with clinical judgement   ADL Overall ADL's : Needs assistance/impaired Eating/Feeding: Bed level;Set up   Grooming: Bed level;Set up   Upper Body Bathing: Minimal assistance;Sitting   Lower Body Bathing: Moderate assistance;Maximal assistance;Sitting/lateral leans   Upper Body Dressing : Minimal assistance;Sitting   Lower Body Dressing: Maximal assistance;Moderate assistance;Sitting/lateral leans               Functional mobility during ADLs:  (unsafe to attempt this session) General ADL Comments: pt generally mod-max assist for LB ADL tasks and min assist for UB bathing/dressing      Vision Baseline Vision/History: Wears glasses Wears Glasses: Reading only (reports they are broken)) Patient Visual Report: No change from baseline Vision Assessment?: No apparent visual deficits     Perception     Praxis      Pertinent Vitals/Pain Pain Assessment: 0-10 Pain Score: 8  Pain Location: pt reports chronic pain in lumbar region of back and chronic pain from fibromyalgia, "it hurts everywhere" Pain Descriptors / Indicators: Aching;Constant Pain Intervention(s): Limited activity within patient's tolerance;Monitored during session;Premedicated before session     Hand Dominance Right   Extremity/Trunk Assessment Upper Extremity Assessment Upper  Extremity Assessment: Generalized weakness (grossly 4-/5 bilaterally)   Lower Extremity Assessment Lower Extremity Assessment: Defer to PT evaluation;Generalized weakness       Communication Communication Communication: No difficulties   Cognition Arousal/Alertness:  Awake/alert Behavior During Therapy: WFL for tasks assessed/performed Overall Cognitive Status: Within Functional Limits for tasks assessed                                     General Comments       Exercises Other Exercises Other Exercises: pt educated in pursed lip breathing with max verbal cues and visual cues with additional instruction in sequencing to support proper technique, pt with strong tendency to breathe through mouth. Demonstrated positive impact of PLB technique with pulse ox, as pt O2 sats initially 92% increasing to 96% with PLBx5 breaths.    Shoulder Instructions      Home Living Family/patient expects to be discharged to:: Private residence Living Arrangements: Alone Available Help at Discharge: Family;Available PRN/intermittently (family stop by when they can, work during the day) Type of Home: Apartment Home Access: Level entry     Home Layout: One level     Bathroom Shower/Tub: Occupational psychologist: [place]: Research scientist (life sciences);Wheelchair - Scientist, product/process development - 2 wheels;Tub bench;Hand held shower head;Grab bars - tub/shower (pt reports that 2WW is broken, reports tub bench is too low and cannot adjust height) Adaptive Equipment: Reacher        Prior Functioning/Environment Level of Independence: Needs assistance  Gait / Transfers Assistance Needed: pt ambulating with RW for household distances ADL's / Homemaking Assistance Needed: pt reports family assists with bathing, cleaning, cooking, groceries, and driving her to appointments (does not drive); pt reports indep with home O2 mgt (2L O2)            OT Problem List: Decreased strength;Cardiopulmonary status limiting activity;Pain;Decreased safety awareness;Decreased activity tolerance;Obesity;Decreased knowledge of use of DME or AE;Impaired balance (sitting and/or standing)      OT Treatment/Interventions: Self-care/ADL training;Therapeutic  exercise;Therapeutic activities;Energy conservation;DME and/or AE instruction;Patient/family education    OT Goals(Current goals can be found in the care plan section) Acute Rehab OT Goals Patient Stated Goal: to get stronger OT Goal Formulation: With patient Time For Goal Achievement: 10/23/16 Potential to Achieve Goals: Good  OT Frequency: Min 1X/week   Barriers to D/C: Decreased caregiver support          Co-evaluation              AM-PAC PT "6 Clicks" Daily Activity     Outcome Measure Help from another person eating meals?: None Help from another person taking care of personal grooming?: None Help from another person toileting, which includes using toliet, bedpan, or urinal?: A Lot Help from another person bathing (including washing, rinsing, drying)?: A Lot Help from another person to put on and taking off regular upper body clothing?: A Little Help from another person to put on and taking off regular lower body clothing?: A Lot 6 Click Score: 17   End of Session Nurse Communication: Other (comment) (CNA notified of external urinary catheter placement)  Activity Tolerance: Patient tolerated treatment well Patient left: in bed;with call bell/phone within reach;with bed alarm set;with family/visitor present  OT Visit Diagnosis: Other abnormalities of gait and mobility (R26.89);Muscle weakness (generalized) (M62.81);Pain Pain - part of body:  (all over)  Time: 3414-4360 OT Time Calculation (min): 31 min Charges:  OT General Charges $OT Visit: 1 Procedure OT Evaluation $OT Eval Moderate Complexity: 1 Procedure OT Treatments $Therapeutic Activity: 8-22 mins G-Codes:     Jeni Salles, MPH, MS, OTR/L ascom [phone] 10/09/16, 10:22 AM

## 2016-10-10 DIAGNOSIS — K625 Hemorrhage of anus and rectum: Secondary | ICD-10-CM

## 2016-10-10 LAB — CREATININE, SERUM
Creatinine, Ser: 0.79 mg/dL (ref 0.44–1.00)
GFR calc Af Amer: >60 mL/min (ref >60)
GFR calc non Af Amer: >60 mL/min (ref >60)

## 2016-10-10 LAB — URINALYSIS, COMPLETE (UACMP) WITH MICROSCOPIC
BACTERIA UA: NONE SEEN
BILIRUBIN URINE: NEGATIVE
GLUCOSE, UA: NEGATIVE mg/dL
HGB URINE DIPSTICK: NEGATIVE
KETONES UR: NEGATIVE mg/dL
LEUKOCYTES UA: NEGATIVE
NITRITE: NEGATIVE
PROTEIN: NEGATIVE mg/dL
Specific Gravity, Urine: 1.024 (ref 1.005–1.030)
pH: 6 (ref 5.0–8.0)

## 2016-10-10 LAB — HEMOGLOBIN AND HEMATOCRIT, BLOOD
HCT: 41.8 % (ref 35.0–47.0)
HEMATOCRIT: 42.7 % (ref 35.0–47.0)
HEMOGLOBIN: 13.5 g/dL (ref 12.0–16.0)
Hemoglobin: 13.6 g/dL (ref 12.0–16.0)

## 2016-10-10 LAB — CBC
HEMATOCRIT: 39.2 % (ref 35.0–47.0)
HEMOGLOBIN: 12.8 g/dL (ref 12.0–16.0)
MCH: 30.9 pg (ref 26.0–34.0)
MCHC: 32.6 g/dL (ref 32.0–36.0)
MCV: 94.9 fL (ref 80.0–100.0)
Platelets: 255 10*3/uL (ref 150–440)
RBC: 4.13 MIL/uL (ref 3.80–5.20)
RDW: 14.4 % (ref 11.5–14.5)
WBC: 11.4 10*3/uL — ABNORMAL HIGH (ref 3.6–11.0)

## 2016-10-10 MED ORDER — PREDNISONE 50 MG PO TABS
50.0000 mg | ORAL_TABLET | Freq: Every day | ORAL | Status: DC
  Administered 2016-10-11: 50 mg via ORAL
  Filled 2016-10-10: qty 1

## 2016-10-10 NOTE — Clinical Social Work Note (Signed)
[employer] Josem Kaufmann is pending. Updated clinicals have been sent to facility for auth. Pt will discharge to [facility] once [employer] Auth has been obtained. MD is aware. CSW will continue to follow.   [name], MSW, LCSW Clinical Social Worker  [phone]

## 2016-10-10 NOTE — Progress Notes (Signed)
Occupational Therapy Treatment Patient Details Name: Loretta Wade MRN: [id number] DOB: Nov 16, 1950 Today's Date: 10/10/2016    History of present illness Pt. is a 66 y.o. female who was admitted to [facility] with COPD exacerbation.  Pt. PMHx includes: Anemia, CHF, edema, fibromyalgia, nocturia, and OA. Pt. has 2L O2 at home.    OT comments  Pt. is a 66 y.o. female who was admitted to [facility] with COPD exacerbation. Pt. SO2 was 94%, HR 78. Pt. presents with weakness, limited activity tolerance, has 2LO2 at home, and limited functional mobility which continues to impact her ability to complete ADL, and IADL tasks. Pt. education was provided about energy conservation/work simplification techniques for ADLs/IADLs, pursed lip breathing, and DME. Pt. was provided with a visual handout. Pt. requested information about COPD. Pt. was provided with an educational handout about COPD printed by nursing. Pt. continues to be appropriate for SNF level of care, and would benefit from follow-up OT services upon discharge.     Follow Up Recommendations  SNF    Equipment Recommendations  3 in 1 bedside commode;Tub/shower bench;Toilet rise with handles (bariatric)    Recommendations for Other Services      Precautions / Restrictions                                                       ADL either performed or assessed with clinical judgement   ADL Overall ADL's : Needs assistance/impaired Eating/Feeding: Bed level;Set up   Grooming: Set up;Bed level   Upper Body Bathing: Minimal assistance   Lower Body Bathing: Maximal assistance   Upper Body Dressing : Minimal assistance   Lower Body Dressing: Maximal assistance                 General ADL Comments: Pt. education was provided about energy conservation techniques, work simplification strategies for ADL, and IADL tasks. Pt. ewas provided with a visual handout out  Pt. education was porvided verbally, and through visual  demonstration.     Vision Baseline Vision/History: Wears glasses Wears Glasses: Reading only Patient Visual Report: No change from baseline Vision Assessment?: No apparent visual deficits   Perception     Praxis      Cognition Arousal/Alertness: Awake/alert Behavior During Therapy: WFL for tasks assessed/performed Overall Cognitive Status: Within Functional Limits for tasks assessed                                          Exercises     Shoulder Instructions       General Comments      Pertinent Vitals/ Pain       Pain Assessment: No/denies pain  Home Living                                          Prior Functioning/Environment              Frequency  Min 1X/week        Progress Toward Goals  OT Goals(current goals can now be found in the care plan section)  Progress towards OT goals: Progressing toward goals  Acute Rehab OT Goals  Patient Stated Goal: to get stronger OT Goal Formulation: With patient Potential to Achieve Goals: Good  Plan      Co-evaluation                 AM-PAC PT "6 Clicks" Daily Activity     Outcome Measure   Help from another person eating meals?: None Help from another person taking care of personal grooming?: None Help from another person toileting, which includes using toliet, bedpan, or urinal?: A Lot Help from another person bathing (including washing, rinsing, drying)?: A Lot Help from another person to put on and taking off regular upper body clothing?: A Little Help from another person to put on and taking off regular lower body clothing?: A Lot 6 Click Score: 17    End of Session    OT Visit Diagnosis: Other abnormalities of gait and mobility (R26.89)   Activity Tolerance Patient tolerated treatment well   Patient Left in bed;with call bell/phone within reach;with bed alarm set   Nurse Communication          Time: 8242-3536 OT Time Calculation (min): 26  min  Charges: OT General Charges $OT Visit: 1 Procedure OT Treatments $Self Care/Home Management : 23-37 mins  Harrel Carina, MS, OTR/L  Harrel Carina, MS, OTR/L 10/10/2016, 10:18 AM

## 2016-10-10 NOTE — Progress Notes (Signed)
Loretta Bellows MD, MRCP(U.K) [address]  [place]  [place], [place] [address]  Main: [place] is being followed for rectal bleeding   Subjective: Patient requested to see me , had some blood streaks yesterday , no blood in stool seen today    Objective: Vital signs in last 24 hours: Vitals:   10/10/16 0529 10/10/16 0759 10/10/16 0840 10/10/16 1249  BP: 121/63  (!) 148/60 131/69  Pulse: 67  70 72  Resp: 14  17 16   Temp: 97.7 F (36.5 C)  98.4 F (36.9 C) 98.1 F (36.7 C)  TempSrc: Oral  Oral Oral  SpO2: 96% 96% 94% 95%  Weight:      Height:       Weight change:   Intake/Output Summary (Last 24 hours) at 10/10/16 1614 Last data filed at 10/10/16 1200  Gross per 24 hour  Intake              960 ml  Output              400 ml  Net              560 ml     Exam: Heart:: Regular rate and rhythm, S1S2 present or without murmur or extra heart sounds Lungs: air entery b/l equal and decreased no added sounds Abdomen: soft, nontender, normal bowel sounds   Lab Results: @LABTEST2 @ Micro Results: Recent Results (from the past 240 hour(s))  Culture, blood (routine x 2)     Status: None   Collection Time: 10/03/16  8:26 AM  Result Value Ref Range Status   Specimen Description BLOOD LEFT HAND  Final   Special Requests   Final    BOTTLES DRAWN AEROBIC AND ANAEROBIC Blood Culture adequate volume   Culture NO GROWTH 5 DAYS  Final   Report Status 10/08/2016 FINAL  Final  Culture, blood (routine x 2)     Status: None   Collection Time: 10/03/16  8:26 AM  Result Value Ref Range Status   Specimen Description BLOOD LEFT FA  Final   Special Requests   Final    BOTTLES DRAWN AEROBIC AND ANAEROBIC Blood Culture adequate volume   Culture NO GROWTH 5 DAYS  Final   Report Status 10/08/2016 FINAL  Final  Urine culture     Status: None   Collection Time: 10/03/16  8:26 AM  Result Value Ref Range Status   Specimen Description URINE, RANDOM  Final   Special Requests NONE  Final   Culture   Final    NO GROWTH Performed at [facility], [address], [place], [place] [address]    Report Status 10/04/2016 FINAL  Final  MRSA PCR Screening     Status: None   Collection Time: 10/03/16  4:25 PM  Result Value Ref Range Status   MRSA by PCR NEGATIVE NEGATIVE Final    Comment:        The GeneXpert MRSA Assay (FDA approved for NASAL specimens only), is one component of a comprehensive MRSA colonization surveillance program. It is not intended to diagnose MRSA infection nor to guide or monitor treatment for MRSA infections.    Studies/Results: No results found. Medications: I have reviewed the patient's current medications. Scheduled Meds: . budesonide (PULMICORT) nebulizer solution  0.25 mg Nebulization BID  . cholecalciferol  1,000 Units Oral Daily  . ciprofloxacin-dexamethasone  4 drop Left EAR BID  . citalopram  20 mg Oral  Daily  . colesevelam  625 mg Oral Q breakfast  . docusate sodium  100 mg Oral BID  . enoxaparin (LOVENOX) injection  40 mg Subcutaneous Q12H  . fluticasone  2 spray Each Nare Daily  . furosemide  40 mg Oral BID  . guaiFENesin  600 mg Oral BID  . magnesium oxide  400 mg Oral Daily  . mouth rinse  15 mL Mouth Rinse BID  . metoprolol tartrate  25 mg Oral BID  . nystatin  5 mL Oral QID  . nystatin   Topical TID  . oxybutynin  5 mg Oral Daily  . pantoprazole  40 mg Oral BID  . [START ON 10/11/2016] predniSONE  50 mg Oral Q breakfast  . pregabalin  150 mg Oral BID  . rOPINIRole  0.75 mg Oral QHS  . sodium chloride flush  3 mL Intravenous Q12H  . umeclidinium-vilanterol  1 puff Inhalation Daily  . cyanocobalamin  500 mcg Oral Daily   Continuous Infusions: PRN Meds:.acetaminophen, diphenhydrAMINE **AND** phenylephrine **AND** acetaminophen, albuterol, ALPRAZolam, chlorpheniramine-HYDROcodone, guaiFENesin-dextromethorphan, HYDROcodone-acetaminophen, ipratropium-albuterol, loperamide, nitroGLYCERIN,  ondansetron (ZOFRAN) IV   Assessment: Active Problems:   COPD exacerbation ([place])    Plan: 1. She probably has some minimal bleeding from internal hemorrhoids. Her CBC has been stable. Explained to the patient with a recent COPD exaceberation the risks of anesthesia right now to do a colonoscopy exceed the benefits and hence wouldn't recommend presently. She would need to be seen I clinic with Dr Tiffany Kocher in a few weeks to determine the timing of her colonoscopy. IF she were to have an overt bleed with drop in Hb , then the benefits of anesthesia may exceed the risks and will then have to proceed with evaluation.   Suggest monitor CBC as an outpatient .   I will sign off.  Please call me if any further GI concerns or questions.  We would like to thank you for the opportunity to participate in the care of Loretta Wade.    LOS: 7 days   Loretta Wade 10/10/2016, 4:14 PM\

## 2016-10-10 NOTE — Progress Notes (Signed)
[place] [place] NAME: Loretta Wade    MR#:  [id number]  DATE OF BIRTH:  1950/10/31  SUBJECTIVE:  Pt had Another bowel movement with streak of blood last night reporting lower abdominal pain. Wants to see GI doctor regarding the blood in the stool and wants to talk to her pulmonologist Dr. Raul Del  REVIEW OF SYSTEMS:  CONSTITUTIONAL: No fever, fatigue or weakness.  EYES: No blurred or double vision.  EARS, NOSE, AND THROAT: No tinnitus or ear pain.  RESPIRATORY: Improving cough, shortness of breath and wheezing CARDIOVASCULAR: No chest pain, orthopnea, positive edema.  GASTROINTESTINAL: No nausea, vomiting, diarrhea .reports lower abdominal pain   GENITOURINARY: No dysuria, hematuria.  ENDOCRINE: No polyuria, nocturia,  HEMATOLOGY: No anemia, easy bruising . SKIN: No rash or lesion. MUSCULOSKELETAL: No joint pain or arthritis.   NEUROLOGIC: No tingling, numbness, weakness.  PSYCHIATRY: No anxiety or depression.   DRUG ALLERGIES:  No Known Allergies  VITALS:  Blood pressure 131/69, pulse 72, temperature 98.1 F (36.7 C), temperature source Oral, resp. rate 16, height 5\' 4"  (1.626 m), weight (!) 163.7 kg (361 lb), SpO2 95 %.  PHYSICAL EXAMINATION:  GENERAL:  66 y.o.-year-old patient lying in the bed with no acute distress. Morbidly obese EYES: Pupils equal, round, reactive to light and accommodation. No scleral icterus. Extraocular muscles intact.  HEENT: Head atraumatic, normocephalic. Oropharynx and nasopharynx clear.  NECK:  Supple, no jugular venous distention. No thyroid enlargement, no tenderness.  LUNGS: Moderate breath sounds bilaterally, No wheezing   no rales,rhonchi or crepitation. No use of accessory muscles of respiration.  CARDIOVASCULAR: S1, S2 normal. No murmurs, rubs, or gallops.  ABDOMEN: Soft, nontender, nondistended. Bowel sounds present. No organomegaly or mass.  EXTREMITIES: 1+ pedal edema, cyanosis,  or clubbing.  NEUROLOGIC: Cranial nerves II through XII are intact. Muscle strength 5/5 in all extremities. Sensation intact. Gait not checked. No pronator drift. No dysarthria PSYCHIATRIC: The patient is alert and oriented x 3.  SKIN: No obvious rash, lesion, or ulcer.    LABORATORY PANEL:   CBC  Recent Labs Lab 10/10/16 0342  WBC 11.4*  HGB 12.8  HCT 39.2  PLT 255   ------------------------------------------------------------------------------------------------------------------  Chemistries   Recent Labs Lab 10/07/16 0506 10/10/16 0342  NA 140  --   K 4.4  --   CL 95*  --   CO2 36*  --   GLUCOSE 152*  --   BUN 21*  --   CREATININE 0.82 0.79  CALCIUM 9.0  --    ------------------------------------------------------------------------------------------------------------------  Cardiac Enzymes  Recent Labs Lab 10/05/16 1601  TROPONINI <0.03   ------------------------------------------------------------------------------------------------------------------  RADIOLOGY:  No results found.  EKG:   Orders placed or performed during the hospital encounter of 10/03/16  . EKG 12-Lead  . EKG 12-Lead  . ED EKG within 10 minutes  . ED EKG within 10 minutes  . EKG 12-Lead  . EKG 12-Lead    ASSESSMENT AND PLAN:    Loretta Wade  is a 66 y.o. female with a known history of COPD, lives on 2 L of oxygen via nasal cannula, fibromyalgia has been having shortness of breath for 2 weeks which has been progressively getting worse associated with cough and wheezing.   #GI bleed-Probably from internal hemorrhoids Patient had another episode of stool with blood streaked last night  GI is not considering any procedures as patient is but patient prefers stocking to gastroenterology   hemodynamically stable, Ordered stool for  Hemoccult PPI, Discussed with gastric neurology Dr. Vicente Males  # CVA ruled out with negative CT head Negative CT head, carotid Dopplers   2-D  echocardiogram-55% ejection fraction no cardiac source of emboli Neuro checks -no neuro deficits and discontinue neuro checks  Appreciate neurology recommendations . Stroke ruled out. Outpatient potential tendon release for contractures is advised PT and OT consult-skilled nursing facility  Resume aspirin which is her home medication   # Acute on chronic hypoxic respiratory failure secondary to COPD exacerbation Clinically improving taper IV Solu-Medrol and change to by mouth prednisone tomorrow  CT chest is negative for pulmonary embolism INebulizer treatments with duo nebs every 6 hours and albuterol as needed Continue Azithromycin Continue Mucinex Continue Pulmicort nebs Changed to by mouth Lasix Incentive spirometry Encourage out of bed as tolerated  patient wants to see her pulmonology Dr. Raul Del consult placed on patient's request,   #Hypertension Continue propranolol  #Hyperlipidemia continue WelChol  #GERD continue Protonix and Pepcid home medications  #Anxiety continue home medication Celexa  #Frequent urination-urine cultures no growth  #Morbid obesity-out of bed to chair Lifestyle changes advised once patient is medically stable  DVT prophylaxis with Lovenox subcutaneous  Disposition SNF-insurance authorization f/u with SW  All the records are reviewed and case discussed with Care Management/Social Workerr. Management plans discussed with the patient,and she is  in agreement.  CODE STATUS: fc  TOTAL TIME TAKING CARE OF THIS PATIENT: 36 minutes.   POSSIBLE D/C IN 1-2 DAYS, DEPENDING ON CLINICAL CONDITION.  Note: This dictation was prepared with Dragon dictation along with smaller phrase technology. Any transcriptional errors that result from this process are unintentional.   Nicholes Mango M.D on 10/10/2016 at 3:22 PM  Between 7am to 6pm - Pager - [phone] After 6pm go to [email or web address] - password [email or web address] [facility]   Office  [phone]  CC: Primary care physician; Patient, No Pcp Per

## 2016-10-11 LAB — CBC
HCT: 41 % (ref 35.0–47.0)
Hemoglobin: 13.2 g/dL (ref 12.0–16.0)
MCH: 31 pg (ref 26.0–34.0)
MCHC: 32.3 g/dL (ref 32.0–36.0)
MCV: 96 fL (ref 80.0–100.0)
PLATELETS: 261 10*3/uL (ref 150–440)
RBC: 4.27 MIL/uL (ref 3.80–5.20)
RDW: 14.4 % (ref 11.5–14.5)
WBC: 11.7 10*3/uL — AB (ref 3.6–11.0)

## 2016-10-11 LAB — OCCULT BLOOD X 1 CARD TO LAB, STOOL: FECAL OCCULT BLD: NEGATIVE

## 2016-10-11 LAB — POTASSIUM: POTASSIUM: 3.7 mmol/L (ref 3.5–5.1)

## 2016-10-11 LAB — MAGNESIUM: Magnesium: 2.3 mg/dL (ref 1.7–2.4)

## 2016-10-11 MED ORDER — CIPROFLOXACIN-DEXAMETHASONE 0.3-0.1 % OT SUSP: 4.0000 [drp] | Freq: Two times a day (BID) | OTIC | 0 refills | Status: DC

## 2016-10-11 MED ORDER — HYDROCOD POLST-CPM POLST ER 10-8 MG/5ML PO SUER: 5.0000 mL | Freq: Two times a day (BID) | ORAL | 0 refills | Status: DC | PRN

## 2016-10-11 MED ORDER — HYDROCORTISONE ACETATE 25 MG RE SUPP: 25.0000 mg | Freq: Two times a day (BID) | RECTAL | 0 refills | Status: DC

## 2016-10-11 MED ORDER — CHLORPHEN-PE-ACETAMINOPHEN 2-5-325 MG PO TABS: 2.0000 | ORAL_TABLET | Freq: Every day | ORAL | 0 refills | Status: DC | PRN

## 2016-10-11 MED ORDER — ALPRAZOLAM 0.5 MG PO TABS: 0.5000 mg | ORAL_TABLET | Freq: Every day | ORAL | 0 refills | Status: DC | PRN

## 2016-10-11 MED ORDER — HYDROCORTISONE ACETATE 25 MG RE SUPP
25.0000 mg | Freq: Two times a day (BID) | RECTAL | Status: DC
  Administered 2016-10-11: 12:00:00 25 mg via RECTAL
  Filled 2016-10-11 (×3): qty 1

## 2016-10-11 MED ORDER — ALUM & MAG HYDROXIDE-SIMETH 200-200-20 MG/5ML PO SUSP
30.0000 mL | ORAL | Status: DC | PRN
  Administered 2016-10-11: 05:00:00 30 mL via ORAL
  Filled 2016-10-11: qty 30

## 2016-10-11 MED ORDER — PREDNISONE 10 MG (21) PO TBPK: ORAL_TABLET | ORAL | 0 refills | Status: DC

## 2016-10-11 MED ORDER — ORAL CARE MOUTH RINSE: 15.0000 mL | Freq: Two times a day (BID) | OROMUCOSAL | 0 refills | Status: DC

## 2016-10-11 MED ORDER — GUAIFENESIN ER 600 MG PO TB12: 600.0000 mg | ORAL_TABLET | Freq: Two times a day (BID) | ORAL | 0 refills | Status: DC

## 2016-10-11 NOTE — Discharge Summary (Signed)
[place] [facility] [place] NAME: Loretta Wade    MR#:  [id number]  DATE OF BIRTH:  [birth year]  DATE OF ADMISSION:  10/03/2016 ADMITTING PHYSICIAN: Nicholes Mango, MD  DATE OF DISCHARGE: No discharge date for patient encounter.  PRIMARY CARE PHYSICIAN: Patient, No Pcp Per     ADMISSION DIAGNOSIS:  COPD exacerbation ([place]) [J44.1]  DISCHARGE DIAGNOSIS:  Active Problems:   COPD exacerbation ([place])   SECONDARY DIAGNOSIS:   Past Medical History:  Diagnosis Date  . Anemia   . CHF (congestive heart failure) ([place])   . COPD (chronic obstructive pulmonary disease) ([facility])    on 2L home o2  . Edema   . Esophageal reflux   . Fatigue   . Fibromyalgia    Following with pain management  . Hypertension   . Nocturia   . OA (osteoarthritis)   . Sleep apnea    not on CPAP    .pro HOSPITAL COURSE:   The patient is a 66 year old Caucasian female with medical history significant for history of COPD, chronic history failure, obesity, hypertension, obstructive sleep apnea, who presented to the hospital with 2 day history of progressively worsening shortness of breath, cough and wheezing. She also had generalized body aches and frequent urination. CT angiogram of the chest revealed low lung volumes, no PE. Labs revealed hyperglycemia, but no other significant abnormalities. Patient was admitted to the hospital, started on steroids, inhalation therapy, and improved clinically. While in the hospital, she was noted to have bright red blood per rectum, streaks. She was seen by gastroenterologist, who felt that it was likely in the hemorrhoids, outpatient follow-up was recommended, no bilirubin drop was noted or active bleeding. Anusol was prescribed. Patient requested to see pulmonologist, consultation of Dr. Leane Platt is still pending. Patient was seen by physical therapist and recommended skilled nursing facility placement for rehabilitation where she  is going to be discharged today after pulmonologist evaluation. Discussion by problem:  #GI bleed-suspected from internal hemorrhoids, as per Dr. Vicente Males, gastroenterologist, recommended to follow-up with Dr. Vira Agar as outpatient. Hemoglobin is stable. Anusol is prescribed.  Hemodynamically stable, Repeated Hemoccult is negative. Continue PPI  # CVA ruled out with negative CT head Negative CT head, carotid Dopplers   2-D echocardiogram-55% ejection fraction no cardiac source of emboli Neuro checks -no neuro deficits and discontinue neuro checks  Appreciate neurology recommendations . Stroke ruled out. Outpatient potential tendon release for contractures was advised. PT and OT consult-skilled nursing facility. Resume aspirin which is her home medication, watch for rectal bleed and hold aspirin if rectal bleeding recurs.    # Acute on chronic hypoxic respiratory failure secondary to COPD exacerbation Clinically improved,  taper steroid doses CT chest was negative for pulmonary embolism INebulizer treatments with duo nebs every 6 hours   Completed Azithromycin therapy Continue Mucinex Incentive spirometry Encourage out of bed as tolerated  patient wants to see her pulmonology Dr. Raul Del consult placed on patient's request, recommendations are pending   #Hypertension Continue propranolol, good control  #Hyperlipidemia continue WelChol  #GERD continue PPI and Pepcid home medications  #Anxiety continue home medication Celexa  #Frequent urination-urine cultures no growth  #Morbid obesity-out of bed to chair Lifestyle changes advised once patient is medically stable   DISCHARGE CONDITIONS:   Stable  CONSULTS OBTAINED:  Treatment Team:  Leotis Pain, MD Erby Pian, MD Jonathon Bellows, MD  DRUG ALLERGIES:  No Known Allergies  DISCHARGE MEDICATIONS:   Current Discharge  Medication List    START taking these medications   Details    ciprofloxacin-dexamethasone (CIPRODEX) OTIC suspension Place 4 drops into the left ear 2 (two) times daily. Qty: 7.5 mL, Refills: 0    guaiFENesin (MUCINEX) 600 MG 12 hr tablet Take 1 tablet (600 mg total) by mouth 2 (two) times daily. Qty: 20 tablet, Refills: 0    hydrocortisone (ANUSOL-HC) 25 MG suppository Place 1 suppository (25 mg total) rectally 2 (two) times daily. Qty: 12 suppository, Refills: 0    mouth rinse LIQD solution 15 mLs by Mouth Rinse route 2 (two) times daily. Qty: 354 mL, Refills: 0    predniSONE (STERAPRED UNI-PAK 21 TAB) 10 MG (21) TBPK tablet Please take 6 pills in the morning on the day 1, then taper by one pill daily until finished, thank you Qty: 21 tablet, Refills: 0      CONTINUE these medications which have CHANGED   Details  ALPRAZolam (XANAX) 0.5 MG tablet Take 1 tablet (0.5 mg total) by mouth daily as needed for anxiety. Qty: 30 tablet, Refills: 0   Associated Diagnoses: Anxiety    Chlorphen-Phenyleph-APAP (CORICIDIN D COLD/FLU/SINUS) 2-5-325 MG TABS Take 2 tablets by mouth daily as needed (for nasal congestion/cough). Qty: 80 each, Refills: 0    chlorpheniramine-HYDROcodone (TUSSIONEX) 10-8 MG/5ML SUER Take 5 mLs by mouth every 12 (twelve) hours as needed for cough. Qty: 115 mL, Refills: 0      CONTINUE these medications which have NOT CHANGED   Details  acetaminophen (TYLENOL) 325 MG tablet Take 2 tablets (650 mg total) by mouth every 6 (six) hours as needed for mild pain (or Fever >/= 101). Qty: 30 tablet, Refills: 0    albuterol (PROVENTIL) (2.5 MG/3ML) 0.083% nebulizer solution USE ONE TREATMENT EVERY 6 HOURS AS NEEDED FOR WHEEZING OR SHORTNESS OF BREATH Qty: 360 mL, Refills: 0    ANORO ELLIPTA 62.5-25 MCG/INH AEPB Inhale 1 puff into the lungs daily.    cholecalciferol (VITAMIN D) 1000 UNITS tablet Take 1,000 Units by mouth daily.     citalopram (CELEXA) 20 MG tablet TAKE ONE (1) TABLET BY MOUTH EVERY DAY Qty: 30 tablet, Refills: 0     colesevelam (WELCHOL) 625 MG tablet TAKE ONE TABLET TWICE A DAY WITH FOOD Qty: 60 tablet, Refills: 0   Associated Diagnoses: Dyslipidemia    cyanocobalamin 500 MCG tablet Take 500 mcg by mouth daily.    dexlansoprazole (DEXILANT) 60 MG capsule TAKE ONE (1) CAPSULE EACH DAY Qty: 30 capsule, Refills: 0    fluticasone (FLONASE) 50 MCG/ACT nasal spray USE TWO SPRAYS IN EACH NOSTRIL EVERY DAY Qty: 16 g, Refills: 0   Associated Diagnoses: Chronic nasal congestion    furosemide (LASIX) 40 MG tablet Take 1 tablet (40 mg total) by mouth 2 (two) times daily. Qty: 30 tablet, Refills: 60    guaiFENesin-dextromethorphan (ROBITUSSIN DM) 100-10 MG/5ML syrup Take 5-10 mLs by mouth every 4 (four) hours as needed for cough.     loperamide (IMODIUM) 2 MG capsule Take 2-4 mg by mouth as needed for diarrhea or loose stools.    Magnesium 250 MG TABS Take 250 mg by mouth daily.    nystatin (MYCOSTATIN) 100000 UNIT/ML suspension Take 5 mLs (500,000 Units total) by mouth 4 (four) times daily. Qty: 180 mL, Refills: 0   Associated Diagnoses: Oral thrush    nystatin ointment (MYCOSTATIN) Apply 1 application topically 3 (three) times daily. Qty: 60 g, Refills: 0   Associated Diagnoses: Candidal intertrigo    oxybutynin (DITROPAN)  5 MG tablet TAKE ONE (1) TABLET BY MOUTH EVERY DAY Qty: 30 tablet, Refills: 0   Associated Diagnoses: Overflow stress incontinence of urine in female    pregabalin (LYRICA) 150 MG capsule Take 1 capsule (150 mg total) by mouth 2 (two) times daily. Qty: 60 capsule, Refills: 0   Associated Diagnoses: Fibromyalgia    propranolol ER (INDERAL LA) 80 MG 24 hr capsule TAKE ONE CAPSULE BY MOUTH DAILY Qty: 30 capsule, Refills: 0    ranitidine (ZANTAC) 150 MG tablet Take 1 tablet (150 mg total) by mouth 2 (two) times daily. Qty: 60 tablet, Refills: 0   Associated Diagnoses: Gastroesophageal reflux disease, esophagitis presence not specified    !! rOPINIRole (REQUIP) 0.25 MG tablet  Take 3 tablets (0.75 mg total) by mouth at bedtime. Qty: 90 tablet, Refills: 0   Associated Diagnoses: RLS (restless legs syndrome)    !! rOPINIRole (REQUIP) 0.25 MG tablet TAKE THREE TABLETS BY MOUTH EVERY NIGHT AT BEDTIME Qty: 270 tablet, Refills: 0   Associated Diagnoses: RLS (restless legs syndrome)     !! - Potential duplicate medications found. Please discuss with provider.    STOP taking these medications     pantoprazole (PROTONIX) 40 MG tablet      predniSONE (DELTASONE) 10 MG tablet      HYDROcodone-acetaminophen (NORCO/VICODIN) 5-325 MG tablet          DISCHARGE INSTRUCTIONS:    The patient is to follow-up with primary care physician within one week after discharge, gastroenterologist  If you experience worsening of your admission symptoms, develop shortness of breath, life threatening emergency, suicidal or homicidal thoughts you must seek medical attention immediately by calling 911 or calling your MD immediately  if symptoms less severe.  You Must read complete instructions/literature along with all the possible adverse reactions/side effects for all the Medicines you take and that have been prescribed to you. Take any new Medicines after you have completely understood and accept all the possible adverse reactions/side effects.   Please note  You were cared for by a hospitalist during your hospital stay. If you have any questions about your discharge medications or the care you received while you were in the hospital after you are discharged, you can call the unit and asked to speak with the hospitalist on call if the hospitalist that took care of you is not available. Once you are discharged, your primary care physician will handle any further medical issues. Please note that NO REFILLS for any discharge medications will be authorized once you are discharged, as it is imperative that you return to your primary care physician (or establish a relationship with a primary  care physician if you do not have one) for your aftercare needs so that they can reassess your need for medications and monitor your lab values.    Today   CHIEF COMPLAINT:   Chief Complaint  Patient presents with  . Urinary Frequency  . Shortness of Breath  . Chest Pain    HISTORY OF PRESENT ILLNESS:     VITAL SIGNS:  Blood pressure (!) 129/54, pulse 82, temperature 98.1 F (36.7 C), temperature source Oral, resp. rate 17, height 5\' 4"  (1.626 m), weight (!) 163.7 kg (361 lb), SpO2 96 %.  I/O:   Intake/Output Summary (Last 24 hours) at 10/11/16 0917 Last data filed at 10/10/16 2000  Gross per 24 hour  Intake              720 ml  Output  600 ml  Net              120 ml    PHYSICAL EXAMINATION:  GENERAL:  66 y.o.-year-old patient lying in the bed with no acute distress.  EYES: Pupils equal, round, reactive to light and accommodation. No scleral icterus. Extraocular muscles intact.  HEENT: Head atraumatic, normocephalic. Oropharynx and nasopharynx clear.  NECK:  Supple, no jugular venous distention. No thyroid enlargement, no tenderness.  LUNGS: Normal breath sounds bilaterally, no wheezing, rales,rhonchi or crepitation. No use of accessory muscles of respiration.  CARDIOVASCULAR: S1, S2 normal. No murmurs, rubs, or gallops.  ABDOMEN: Soft, non-tender, non-distended. Bowel sounds present. No organomegaly or mass.  EXTREMITIES: No pedal edema, cyanosis, or clubbing.  NEUROLOGIC: Cranial nerves II through XII are intact. Muscle strength 5/5 in all extremities. Sensation intact. Gait not checked.  PSYCHIATRIC: The patient is alert and oriented x 3.  SKIN: No obvious rash, lesion, or ulcer.   DATA REVIEW:   CBC  Recent Labs Lab 10/11/16 0254  WBC 11.7*  HGB 13.2  HCT 41.0  PLT 261    Chemistries   Recent Labs Lab 10/07/16 0506 10/10/16 0342 10/11/16 0254  NA 140  --   --   K 4.4  --  3.7  CL 95*  --   --   CO2 36*  --   --   GLUCOSE 152*  --    --   BUN 21*  --   --   CREATININE 0.82 0.79  --   CALCIUM 9.0  --   --   MG  --   --  2.3    Cardiac Enzymes  Recent Labs Lab 10/05/16 1601  TROPONINI <0.03    Microbiology Results  Results for orders placed or performed during the hospital encounter of 10/03/16  Culture, blood (routine x 2)     Status: None   Collection Time: 10/03/16  8:26 AM  Result Value Ref Range Status   Specimen Description BLOOD LEFT HAND  Final   Special Requests   Final    BOTTLES DRAWN AEROBIC AND ANAEROBIC Blood Culture adequate volume   Culture NO GROWTH 5 DAYS  Final   Report Status 10/08/2016 FINAL  Final  Culture, blood (routine x 2)     Status: None   Collection Time: 10/03/16  8:26 AM  Result Value Ref Range Status   Specimen Description BLOOD LEFT FA  Final   Special Requests   Final    BOTTLES DRAWN AEROBIC AND ANAEROBIC Blood Culture adequate volume   Culture NO GROWTH 5 DAYS  Final   Report Status 10/08/2016 FINAL  Final  Urine culture     Status: None   Collection Time: 10/03/16  8:26 AM  Result Value Ref Range Status   Specimen Description URINE, RANDOM  Final   Special Requests NONE  Final   Culture   Final    NO GROWTH Performed at [facility], [place] [address], [place], [place] [address]    Report Status 10/04/2016 FINAL  Final  MRSA PCR Screening     Status: None   Collection Time: 10/03/16  4:25 PM  Result Value Ref Range Status   MRSA by PCR NEGATIVE NEGATIVE Final    Comment:        The GeneXpert MRSA Assay (FDA approved for NASAL specimens only), is one component of a comprehensive MRSA colonization surveillance program. It is not intended to diagnose MRSA infection nor to guide or  monitor treatment for MRSA infections.     RADIOLOGY:  No results found.  EKG:   Orders placed or performed during the hospital encounter of 10/03/16  . EKG 12-Lead  . EKG 12-Lead  . ED EKG within 10 minutes  . ED EKG within 10 minutes  . EKG 12-Lead  . EKG  12-Lead      Management plans discussed with the patient, family and they are in agreement.  CODE STATUS:     Code Status Orders        Start     Ordered   10/03/16 1144  Full code  Continuous     10/03/16 1143    Code Status History    Date Active Date Inactive Code Status Order ID Comments User Context   11/01/2015  9:21 AM 11/01/2015  1:03 PM Full Code [id number]  Harrie Foreman, MD Inpatient   07/11/2015  4:44 PM 07/14/2015  8:37 PM Full Code [id number]  Gladstone Lighter, MD Inpatient   05/12/2015  6:04 PM 05/16/2015  7:58 PM Full Code [id number]  Demetrios Loll, MD Inpatient   04/12/2015  5:08 PM 04/15/2015  6:04 PM Full Code [id number]  Hower, Aaron Mose, MD ED   01/28/2015  8:08 AM 02/08/2015  2:34 PM Full Code [id number]  Loletha Grayer, MD ED   11/13/2014  1:02 PM 11/19/2014  4:39 PM Full Code [id number]  Theodoro Grist, MD Inpatient   11/04/2014  5:50 PM 11/08/2014  4:37 PM Full Code [id number]  Vaughan Basta, MD Inpatient      TOTAL TIME TAKING CARE OF THIS PATIENT: 40 minutes.    Theodoro Grist M.D on 10/11/2016 at 9:17 AM  Between 7am to 6pm - Pager - [phone]  After 6pm go to [email or web address] - password [email or web address] [place] [facility]  Office  [phone]  CC: Primary care physician; Patient, No Pcp Per

## 2016-10-11 NOTE — Progress Notes (Signed)
EMS contacted for transport

## 2016-10-11 NOTE — Clinical Social Work Note (Signed)
Pt is ready for discharge today and will go to [facility]. [employer] Josem Kaufmann has been obtained. Pt stated that she updated her family of discharge plan. Pt is aware and agreeable to discharge plan. RN will call report. [facility] will provide transportation. CSW is signing off as no further needs identified.   [name], MSW, LCSW  Clinical Social Worker  [phone]

## 2016-10-11 NOTE — Clinical Social Work Placement (Signed)
   CLINICAL SOCIAL WORK PLACEMENT  NOTE  Date:  10/11/2016  Patient Details  Name: Loretta Wade MRN: [id number] Date of Birth: [birth year]  Clinical Social Work is seeking post-discharge placement for this patient at the [place] level of care (*CSW will initial, date and re-position this form in  chart as items are completed):  Yes   Patient/family provided with [place] Work Department's list of facilities offering this level of care within the geographic area requested by the patient (or if unable, by the patient's family).  Yes   Patient/family informed of their freedom to choose among providers that offer the needed level of care, that participate in Medicare, Medicaid or managed care program needed by the patient, have an available bed and are willing to accept the patient.  Yes   Patient/family informed of [place]'s ownership interest in [facility] and [facility], as well as of the fact that they are under no obligation to receive care at these facilities.  PASRR submitted to EDS on       PASRR number received on       Existing PASRR number confirmed on 10/07/16     FL2 transmitted to all facilities in geographic area requested by pt/family on 10/07/16     FL2 transmitted to all facilities within larger geographic area on       Patient informed that his/her managed care company has contracts with or will negotiate with certain facilities, including the following:        Yes   Patient/family informed of bed offers received.  Patient chooses bed at [facility]     Physician recommends and patient chooses bed at      Patient to be transferred to [facility] on 10/11/16.  Patient to be transferred to facility by [facility]     Patient family notified on 10/11/16 of transfer.  Name of family member notified:  Pt notified her family.     PHYSICIAN       Additional Comment:     _______________________________________________ [name], LCSW 10/11/2016, 11:31 AM

## 2016-10-11 NOTE — Progress Notes (Signed)
Called report to Fenton Foy LPN at [facility], patient is [facility]

## 2016-10-11 NOTE — Progress Notes (Signed)
Per Dr Raul Del patient is ok to be discharged to [facility]  at this time

## 2016-10-11 NOTE — Consult Note (Signed)
[facility] Inpatient Consult   10/11/2016  Loretta Wade [facility] 02-11-1951 [id number]   Patient screened for potential [facility] Care Management services. Patient is on the [facility] registry as a benefit of their [employer] . Electronic medical record reveals patient's discharge plan is SNF. [facility] Care Management services not appropriate at this time. If patient's post hospital needs change please place a [facility] Care Management consult. For questions please contact:   Nevyn Bossman RN, BSN [facility] Liaison  [phone]) Business Mobile [phone]) Toll free office

## 2016-10-19 ENCOUNTER — Other Ambulatory Visit: Payer: Self-pay | Admitting: *Deleted

## 2016-10-19 NOTE — Patient Outreach (Signed)
[facility]) Care Management  10/19/2016  Loretta Wade May 28, 1950 [id number]   Met with Loretta Wade, SW at facility, he states patient lives alone, only wants to be at facility 20 days or less.   Met with patient, she has had 2 admits and has history of COPD and HF.  RNCM reviewed [facility] care management program.  Patient accepted brochure and will review and RNCM will check on patient at next facility visit.  Plan to follow up with patient at next facility visit. Royetta Crochet. Laymond Purser, RN, BSN, [place] [phone]) Business Cell  [phone]) Toll Free Office

## 2016-10-20 ENCOUNTER — Other Ambulatory Visit
Admission: RE | Admit: 2016-10-20 | Discharge: 2016-10-20 | Disposition: A | Discharge status: Home / Self Care | Payer: Medicare PPO | Source: Skilled Nursing Facility | Attending: Family Medicine | Admitting: Family Medicine

## 2016-10-20 DIAGNOSIS — I509 Heart failure, unspecified: Secondary | ICD-10-CM | POA: Insufficient documentation

## 2016-10-20 LAB — BRAIN NATRIURETIC PEPTIDE: B Natriuretic Peptide: 44 pg/mL (ref 0.0–100.0)

## 2016-10-26 ENCOUNTER — Other Ambulatory Visit: Payer: Self-pay | Admitting: *Deleted

## 2016-10-26 NOTE — Patient Outreach (Signed)
[facility]) Care Management  10/26/2016  Kayren Holck [place] Jul 13, 1950 [id number]   Met with patient at bedside to follow up on [facility] program services.  Patient will discharge on 8/28.  Patient reports she will have home care services and feels this will be enough. She does remember having [place] [place] in the past and felt the program helped her and she appreciates the service and verbalizes she understands [facility] care management program.   Patient declines services at this time.  Patient has [facility] information and RNCM contact for future needs and reference.   Plan to sign off. Royetta Crochet. Laymond Purser, RN, BSN, [name] [phone]) Business Cell  [phone]) Toll Free Office

## 2016-11-01 ENCOUNTER — Emergency Department
Admission: EM | Admit: 2016-11-01 | Discharge: 2016-11-02 | Disposition: A | Discharge status: Home / Self Care | Payer: Medicare PPO | Attending: Nurse Practitioner | Admitting: Nurse Practitioner

## 2016-11-01 DIAGNOSIS — R1084 Generalized abdominal pain: Secondary | ICD-10-CM

## 2016-11-01 DIAGNOSIS — G8929 Other chronic pain: Secondary | ICD-10-CM

## 2016-11-01 DIAGNOSIS — Z79899 Other long term (current) drug therapy: Secondary | ICD-10-CM | POA: Diagnosis not present

## 2016-11-01 DIAGNOSIS — I119 Hypertensive heart disease without heart failure: Secondary | ICD-10-CM | POA: Insufficient documentation

## 2016-11-01 DIAGNOSIS — M549 Dorsalgia, unspecified: Secondary | ICD-10-CM

## 2016-11-01 DIAGNOSIS — M545 Low back pain: Secondary | ICD-10-CM | POA: Insufficient documentation

## 2016-11-01 DIAGNOSIS — J449 Chronic obstructive pulmonary disease, unspecified: Secondary | ICD-10-CM | POA: Diagnosis not present

## 2016-11-01 DIAGNOSIS — M7918 Myalgia, other site: Secondary | ICD-10-CM

## 2016-11-01 DIAGNOSIS — Z87891 Personal history of nicotine dependence: Secondary | ICD-10-CM | POA: Diagnosis not present

## 2016-11-01 DIAGNOSIS — R109 Unspecified abdominal pain: Secondary | ICD-10-CM | POA: Diagnosis present

## 2016-11-01 LAB — COMPREHENSIVE METABOLIC PANEL
ALT: 23 U/L (ref 14–54)
AST: 21 U/L (ref 15–41)
Albumin: 3 g/dL — ABNORMAL LOW (ref 3.5–5.0)
Alkaline Phosphatase: 76 U/L (ref 38–126)
Anion gap: 6 (ref 5–15)
BILIRUBIN TOTAL: 0.6 mg/dL (ref 0.3–1.2)
BUN: 11 mg/dL (ref 6–20)
CALCIUM: 9.2 mg/dL (ref 8.9–10.3)
CHLORIDE: 102 mmol/L (ref 101–111)
CO2: 36 mmol/L — ABNORMAL HIGH (ref 22–32)
CREATININE: 0.66 mg/dL (ref 0.44–1.00)
Glucose, Bld: 136 mg/dL — ABNORMAL HIGH (ref 65–99)
Potassium: 3.6 mmol/L (ref 3.5–5.1)
Sodium: 144 mmol/L (ref 135–145)
TOTAL PROTEIN: 5.9 g/dL — AB (ref 6.5–8.1)

## 2016-11-01 LAB — URINALYSIS, COMPLETE (UACMP) WITH MICROSCOPIC
Bilirubin Urine: NEGATIVE
GLUCOSE, UA: NEGATIVE mg/dL
Hgb urine dipstick: NEGATIVE
KETONES UR: NEGATIVE mg/dL
Nitrite: POSITIVE — AB
PH: 8 (ref 5.0–8.0)
Protein, ur: 30 mg/dL — AB
SPECIFIC GRAVITY, URINE: 1.021 (ref 1.005–1.030)

## 2016-11-01 LAB — CBC
HEMATOCRIT: 34.5 % — AB (ref 35.0–47.0)
Hemoglobin: 11.2 g/dL — ABNORMAL LOW (ref 12.0–16.0)
MCH: 31.8 pg (ref 26.0–34.0)
MCHC: 32.5 g/dL (ref 32.0–36.0)
MCV: 97.8 fL (ref 80.0–100.0)
PLATELETS: 202 10*3/uL (ref 150–440)
RBC: 3.53 MIL/uL — AB (ref 3.80–5.20)
RDW: 14.9 % — ABNORMAL HIGH (ref 11.5–14.5)
WBC: 4.3 10*3/uL (ref 3.6–11.0)

## 2016-11-01 LAB — LIPASE, BLOOD: Lipase: 17 U/L (ref 11–51)

## 2016-11-01 MED ORDER — PREGABALIN 75 MG PO CAPS
150.0000 mg | ORAL_CAPSULE | Freq: Two times a day (BID) | ORAL | Status: DC
  Administered 2016-11-01 – 2016-11-02 (×2): 150 mg via ORAL
  Filled 2016-11-01: qty 2

## 2016-11-01 MED ORDER — PREGABALIN 75 MG PO CAPS
150.0000 mg | ORAL_CAPSULE | Freq: Every day | ORAL | Status: DC
  Filled 2016-11-01: qty 2

## 2016-11-01 MED ORDER — ROPINIROLE HCL 0.5 MG PO TABS
0.7500 mg | ORAL_TABLET | Freq: Every day | ORAL | Status: DC
  Filled 2016-11-01: qty 1

## 2016-11-01 MED ORDER — FUROSEMIDE 40 MG PO TABS
40.0000 mg | ORAL_TABLET | Freq: Two times a day (BID) | ORAL | Status: DC
  Administered 2016-11-01 – 2016-11-02 (×2): 40 mg via ORAL
  Filled 2016-11-01 (×2): qty 1

## 2016-11-01 MED ORDER — ROPINIROLE HCL 0.25 MG PO TABS
0.7500 mg | ORAL_TABLET | Freq: Every day | ORAL | Status: DC
  Administered 2016-11-01: 0.75 mg via ORAL
  Filled 2016-11-01 (×2): qty 3

## 2016-11-01 MED ORDER — CITALOPRAM HYDROBROMIDE 20 MG PO TABS
20.0000 mg | ORAL_TABLET | Freq: Every day | ORAL | Status: DC
  Administered 2016-11-02: 20 mg via ORAL
  Filled 2016-11-01 (×2): qty 1

## 2016-11-01 MED ORDER — CEPHALEXIN 500 MG PO CAPS
500.0000 mg | ORAL_CAPSULE | Freq: Once | ORAL | Status: AC
Start: 2016-11-01 — End: 2016-11-01
  Administered 2016-11-01: 500 mg via ORAL
  Filled 2016-11-01: qty 1

## 2016-11-01 MED ORDER — OXYCODONE-ACETAMINOPHEN 5-325 MG PO TABS
2.0000 | ORAL_TABLET | Freq: Once | ORAL | Status: AC
  Administered 2016-11-01: 2 via ORAL
  Filled 2016-11-01: qty 2

## 2016-11-01 MED ORDER — PROPRANOLOL HCL ER 80 MG PO CP24
80.0000 mg | ORAL_CAPSULE | Freq: Every day | ORAL | Status: DC
  Administered 2016-11-02: 80 mg via ORAL
  Filled 2016-11-01 (×2): qty 1

## 2016-11-01 NOTE — Clinical Social Work Note (Signed)
CSW informed by Cyril Mourning with PT that their recommendation is for STR with conversion to LTC. PT stated that patient initially refused to stand but after much coaxing decided to try and was able to. CSW has initiated a bed search. Will await bed offers and then [facility]. [employer] Kaufmann will not be in today and will have to follow up tomorrow. Shela Leff MSW,LCSW [phone]

## 2016-11-01 NOTE — ED Notes (Signed)
Social work consult completed. Social worker spoke to This Therapist, sports.

## 2016-11-01 NOTE — ED Provider Notes (Addendum)
[facility] Emergency Department Provider Note  ____________________________________________  Time seen: Approximately 3:37 PM  I have reviewed the triage vital signs and the nursing notes.   HISTORY  Chief Complaint Back Pain    HPI Loretta Wade is a 66 y.o. female with a history of morbid obesity and immobility, COPD, and multiple other medical comorbidities presenting for acute on chronic abdominal and back pain. The patient had a recent admission for COPD exacerbation and was discharged to [facility]. She was discharged home yesterday, but has been unable to perform any of her ADLs. Today, she reports a diffuse nonfocal abdominal pain and a low back pain most prominent on the right buttock that she has had for months to years. It is slightly worse than usual. It is not different in character. There are no other associated symptoms including nausea vomiting or diarrhea, new numbness tingling or weakness. She thinks that she tried some of the pain medication prescribed to her and that it helped "a little." The patient denies any fever or chills. Her home health nurse was concerned that she was "not strong enough" to be living independently at this time.   Past Medical History:  Diagnosis Date  . Anemia   . CHF (congestive heart failure) ([place])   . COPD (chronic obstructive pulmonary disease) (HCC)    on 2L home o2  . Edema   . Esophageal reflux   . Fatigue   . Fibromyalgia    Following with pain management  . Hypertension   . Nocturia   . OA (osteoarthritis)   . Sleep apnea    not on CPAP    Patient Active Problem List   Diagnosis Date Noted  . Candidal intertrigo 02/08/2016  . Dysuria 02/08/2016  . Requires assistance with activities of daily living (ADL) 02/08/2016  . Oral thrush 11/10/2015  . Tachypnea 11/01/2015  . Acute non-recurrent maxillary sinusitis 10/29/2015  . Fall 07/11/2015  . Dyslipidemia 06/17/2015  . Overflow stress  incontinence of urine in female 06/17/2015  . Chronic pain 06/10/2015  . Chronic low back pain (Location of Tertiary source of pain) (Bilateral) (R>L) 06/10/2015  . Chronic shoulder pain (Location of Secondary source of pain) (Left) 06/10/2015  . Costochondritis (Location of Primary Source of Pain) 06/10/2015  . Long term current use of opiate analgesic 06/10/2015  . Long term prescription opiate use 06/10/2015  . Opiate use 06/10/2015  . Encounter for therapeutic drug level monitoring 06/10/2015  . Encounter for pain management planning 06/10/2015  . Chronic feet pain (Bilateral) (R>L) 06/10/2015  . Osteoarthritis 06/10/2015  . Osteoarthritis of shoulder (Left) 06/10/2015  . COPD exacerbation ([place]) 04/12/2015  . Poor mobility 04/12/2015  . Fibromyalgia 03/23/2015  . GERD (gastroesophageal reflux disease) 03/23/2015  . MRSA infection 11/17/2014  . Diastolic CHF, acute on chronic (HCC) 11/12/2014  . Morbid obesity with BMI of 50.0-59.9, adult ([place]) 11/12/2014  . Neck pain, chronic 11/12/2014  . COPD (chronic obstructive pulmonary disease) ([place]) 11/04/2014  . Urinary tract infection 10/02/2014    Past Surgical History:  Procedure Laterality Date  . ABDOMINAL HYSTERECTOMY    . CHOLECYSTECTOMY    . TOTAL KNEE ARTHROPLASTY     right  . VESICOVAGINAL FISTULA CLOSURE W/ TAH      Current Outpatient Rx  . Order #: [id number] Class: Normal  . Order #: [id number] Class: Normal  . Order #: [id number] Class: Historical Med  . Order #: [id number] Class: Print  . Order #: [id number] Class: Historical Med  . Order #:  [id number] Class: Normal  . Order #: [id number] Class: Normal  . Order #: [id number] Class: Normal  . Order #: [id number] Class: Historical Med  . Order #: [id number] Class: Normal  . Order #: [id number] Class: Normal  . Order #: [id number] Class: Normal  . Order #: [id number] Class: Normal  . Order #: [id number] Class: Historical Med  . Order #: [id number] Class: Normal  . Order #: [id number] Class:  Historical Med  . Order #: [id number] Class: Historical Med  . Order #: [id number] Class: Normal  . Order #: [id number] Class: Normal  . Order #: [id number] Class: Normal  . Order #: [id number] Class: Normal  . Order #: [id number] Class: Print  . Order #: [id number] Class: Normal  . Order #: [id number] Class: Normal  . Order #: [id number] Class: Normal  . Order #: [id number] Class: Print  . Order #: [id number] Class: Print  . Order #: [id number] Class: Normal  . Order #: [id number] Class: Normal    Allergies Gabapentin  Family History  Problem Relation Age of Onset  . Hypertension Mother   . Diabetes Unknown   . Breast cancer Daughter     Social History Social History  Substance Use Topics  . Smoking status: Former Smoker    Quit date: 11/04/1983  . Smokeless tobacco: Never Used     Comment: quit several years ago - almost 30 years ago  . Alcohol use No    Review of Systems Constitutional: No fever/chills.No lightheadedness or syncope. Eyes: No visual changes. No blurred or double vision. ENT: No sore throat. No congestion or rhinorrhea. Cardiovascular: Denies chest pain. Denies palpitations. Respiratory: Denies shortness of breath.  No cough. Gastrointestinal: Diffuse nonfocal abdominal pain.  No nausea, no vomiting.  No diarrhea.  No constipation. Genitourinary: Negative for dysuria. Musculoskeletal: Acute on chronic back pain, most prominent over the right buttock. Skin: Negative for rash. Neurological: Negative for headaches. No focal numbness, tingling or weakness.     ____________________________________________   PHYSICAL EXAM:  VITAL SIGNS: ED Triage Vitals  Enc Vitals Group     BP 11/01/16 1356 136/65     Pulse Rate 11/01/16 1356 92     Resp 11/01/16 1356 20     Temp 11/01/16 1356 98.7 F (37.1 C)     Temp Source 11/01/16 1356 Oral     SpO2 11/01/16 1356 94 %     Weight 11/01/16 1358 (!) 354 lb (160.6 kg)     Height 11/01/16 1358 5\' 4"  (1.626 m)     Head Circumference  --      [facility] --      Pain Score 11/01/16 1356 10     Pain Loc --      Pain Edu? --      Excl. in [place]? --     Constitutional: Alert and oriented. Chronically ill appearing and in no acute distress. Answers questions appropriately. The patient is in the bed and has very poor mobility, she is no event able to roll over on her side. Eyes: Conjunctivae are normal.  EOMI. No scleral icterus. Head: Atraumatic. Nose: No congestion/rhinnorhea. Mouth/Throat: Mucous membranes are moist.  Neck: No stridor.  Supple.  No meningismus. Cardiovascular: Normal rate, regular rhythm. No murmurs, rubs or gallops.  Respiratory: Mild tachypnea without accessory muscle use or retractions. The patient has diffuse wheezing in the lung fields bilaterally. She is on exogenous oxygen and satting in the mid 90s on this. Speaks in full sentences without difficulty.  Gastrointestinal: Morbidly obese. Soft, and nondistended. Mild diffuse tenderness to palpation without focality.  No guarding or rebound.  No peritoneal signs. Musculoskeletal:  No LE edema. No ttp in the calves or palpable cords.  Negative Homan's sign. Neurologic:  A&Ox3.  Speech is clear.  Face and smile are symmetric.  EOMI.  Moves all extremities well. Skin:  Skin is warm, dry and intact. No rash noted. Psychiatric: Mood and affect are normal. Speech and behavior are normal.  Normal judgement.  ____________________________________________   LABS (all labs ordered are listed, but only abnormal results are displayed)  Labs Reviewed  CBC - Abnormal; Notable for the following:       Result Value   RBC 3.53 (*)    Hemoglobin 11.2 (*)    HCT 34.5 (*)    RDW 14.9 (*)    All other components within normal limits  COMPREHENSIVE METABOLIC PANEL - Abnormal; Notable for the following:    CO2 36 (*)    Glucose, Bld 136 (*)    Total Protein 5.9 (*)    Albumin 3.0 (*)    All other components within normal limits  LIPASE, BLOOD  URINALYSIS, COMPLETE  (UACMP) WITH MICROSCOPIC   ____________________________________________  EKG  Not indicated ____________________________________________  RADIOLOGY  No results found.  ____________________________________________   PROCEDURES  Procedure(s) performed: None  Procedures  Critical Care performed: No ____________________________________________   INITIAL IMPRESSION / ASSESSMENT AND PLAN / ED COURSE  Pertinent labs & imaging results that were available during my care of the patient were reviewed by me and considered in my medical decision making (see chart for details).  66 y.o. female with a history of morbid obesity and immobility presenting with acute on chronic pain, and concern that she is not able to care for herself. On my examination, I do not see any evidence for acute intra-abdominal infection although her exam can be limited due to her morbid obesity. I will send basic laboratory studies as well as a urinalysis. In addition, her back pain is chronic in nature and she has no evidence for cauda equina syndrome or acute cord compression. I'll plan to treat the patient's pain, and have placed a social worker consult for basement as this patient is unable to care for herself at home.  A physical therapy consult has been placed as well.  ----------------------------------------- 5:18 PM on 11/01/2016 -----------------------------------------  Patient continues to be hemodynamically stable and her pain is improved. A physical therapy consult has recommended rehabilitation, and her social worker has low dose now that her insurance company is reviewing her case, and is unlikely to have a final decision before tomorrow. We'll plan to board her overnight in the emergency department. She'll be signed out to the oncoming physician.   ____________________________________________  FINAL CLINICAL IMPRESSION(S) / ED DIAGNOSES  Final diagnoses:  Right buttock pain  Diffuse abdominal  pain         NEW MEDICATIONS STARTED DURING THIS VISIT:  New Prescriptions   No medications on file      Eula Listen, MD 11/01/16 1559    Eula Listen, MD 11/01/16 1718

## 2016-11-01 NOTE — NC FL2 (Signed)
[place] LEVEL OF CARE SCREENING TOOL     IDENTIFICATION  Patient Name: Loretta Wade Birthdate: 09/23/1950 Sex: female Admission Date (Current Location): 11/01/2016  [place] and [place] Number:  Engineering geologist and Address:  [facility], [address], [place], [place] [address]      Provider Number: [id number]  Attending Physician Name and Address:  Eula Listen, MD  Relative Name and Phone Number:       Current Level of Care: Hospital Recommended Level of Care: [place] Prior Approval Number:    Date Approved/Denied:   PASRR Number:    Discharge Plan: SNF    Current Diagnoses: Patient Active Problem List   Diagnosis Date Noted  . Candidal intertrigo 02/08/2016  . Dysuria 02/08/2016  . Requires assistance with activities of daily living (ADL) 02/08/2016  . Oral thrush 11/10/2015  . Tachypnea 11/01/2015  . Acute non-recurrent maxillary sinusitis 10/29/2015  . Fall 07/11/2015  . Dyslipidemia 06/17/2015  . Overflow stress incontinence of urine in female 06/17/2015  . Chronic pain 06/10/2015  . Chronic low back pain (Location of Tertiary source of pain) (Bilateral) (R>L) 06/10/2015  . Chronic shoulder pain (Location of Secondary source of pain) (Left) 06/10/2015  . Costochondritis (Location of Primary Source of Pain) 06/10/2015  . Long term current use of opiate analgesic 06/10/2015  . Long term prescription opiate use 06/10/2015  . Opiate use 06/10/2015  . Encounter for therapeutic drug level monitoring 06/10/2015  . Encounter for pain management planning 06/10/2015  . Chronic feet pain (Bilateral) (R>L) 06/10/2015  . Osteoarthritis 06/10/2015  . Osteoarthritis of shoulder (Left) 06/10/2015  . COPD exacerbation ([place]) 04/12/2015  . Poor mobility 04/12/2015  . Fibromyalgia 03/23/2015  . GERD (gastroesophageal reflux disease) 03/23/2015  . MRSA infection 11/17/2014  . Diastolic CHF, acute  on chronic (HCC) 11/12/2014  . Morbid obesity with BMI of 50.0-59.9, adult ([place]) 11/12/2014  . Neck pain, chronic 11/12/2014  . COPD (chronic obstructive pulmonary disease) ([place]) 11/04/2014  . Urinary tract infection 10/02/2014    Orientation RESPIRATION BLADDER Height & Weight     Self, Time, Situation, Place  O2, Normal (3 liters) Continent Weight: (!) 354 lb (160.6 kg) Height:  5\' 4"  (162.6 cm)  BEHAVIORAL SYMPTOMS/MOOD NEUROLOGICAL BOWEL NUTRITION STATUS   (none)  (none) Continent Diet (regular)  AMBULATORY STATUS COMMUNICATION OF NEEDS Skin   Extensive Assist Verbally Normal                       Personal Care Assistance Level of Assistance  Bathing, Dressing Bathing Assistance: Limited assistance Feeding assistance: Limited assistance Dressing Assistance: Limited assistance     Functional Limitations Info   (none)          SPECIAL CARE FACTORS FREQUENCY  PT (By licensed PT)                    Contractures Contractures Info: Not present    Additional Factors Info  Allergies Code Status Info: gabapentin       Isolation Precautions Info: mrsa     Current Medications (11/01/2016):  This is the current hospital active medication list Current Facility-Administered Medications  Medication Dose Route Frequency Provider Last Rate Last Dose  . oxyCODONE-acetaminophen (PERCOCET/ROXICET) 5-325 MG per tablet 2 tablet  2 tablet Oral Once Eula Listen, MD       Current Outpatient Prescriptions  Medication Sig Dispense Refill  . acetaminophen (TYLENOL) 325 MG tablet  Take 2 tablets (650 mg total) by mouth every 6 (six) hours as needed for mild pain (or Fever >/= 101). 30 tablet 0  . albuterol (PROVENTIL) (2.5 MG/3ML) 0.083% nebulizer solution USE ONE TREATMENT EVERY 6 HOURS AS NEEDED FOR WHEEZING OR SHORTNESS OF BREATH 360 mL 0  . ANORO ELLIPTA 62.5-25 MCG/INH AEPB Inhale 1 puff into the lungs daily.    . chlorpheniramine-HYDROcodone (TUSSIONEX) 10-8  MG/5ML SUER Take 5 mLs by mouth every 12 (twelve) hours as needed for cough. 115 mL 0  . cholecalciferol (VITAMIN D) 1000 UNITS tablet Take 1,000 Units by mouth daily.     . ciprofloxacin-dexamethasone (CIPRODEX) OTIC suspension Place 4 drops into the left ear 2 (two) times daily. 7.5 mL 0  . citalopram (CELEXA) 20 MG tablet TAKE ONE (1) TABLET BY MOUTH EVERY DAY 30 tablet 0  . colesevelam (WELCHOL) 625 MG tablet TAKE ONE TABLET TWICE A DAY WITH FOOD 60 tablet 0  . cyanocobalamin 500 MCG tablet Take 500 mcg by mouth daily.    Marland Kitchen dexlansoprazole (DEXILANT) 60 MG capsule TAKE ONE (1) CAPSULE EACH DAY 30 capsule 0  . fluticasone (FLONASE) 50 MCG/ACT nasal spray USE TWO SPRAYS IN EACH NOSTRIL EVERY DAY 16 g 0  . furosemide (LASIX) 40 MG tablet Take 1 tablet (40 mg total) by mouth 2 (two) times daily. 30 tablet 60  . guaiFENesin (MUCINEX) 600 MG 12 hr tablet Take 1 tablet (600 mg total) by mouth 2 (two) times daily. 20 tablet 0  . guaiFENesin-dextromethorphan (ROBITUSSIN DM) 100-10 MG/5ML syrup Take 5-10 mLs by mouth every 4 (four) hours as needed for cough.     . hydrocortisone (ANUSOL-HC) 25 MG suppository Place 1 suppository (25 mg total) rectally 2 (two) times daily. 12 suppository 0  . loperamide (IMODIUM) 2 MG capsule Take 2-4 mg by mouth as needed for diarrhea or loose stools.    . Magnesium 250 MG TABS Take 250 mg by mouth daily.    Marland Kitchen mouth rinse LIQD solution 15 mLs by Mouth Rinse route 2 (two) times daily. 354 mL 0  . nystatin (MYCOSTATIN) 100000 UNIT/ML suspension Take 5 mLs (500,000 Units total) by mouth 4 (four) times daily. 180 mL 0  . nystatin ointment (MYCOSTATIN) Apply 1 application topically 3 (three) times daily. 60 g 0  . oxybutynin (DITROPAN) 5 MG tablet TAKE ONE (1) TABLET BY MOUTH EVERY DAY 30 tablet 0  . pregabalin (LYRICA) 150 MG capsule Take 1 capsule (150 mg total) by mouth 2 (two) times daily. 60 capsule 0  . propranolol ER (INDERAL LA) 80 MG 24 hr capsule TAKE ONE CAPSULE BY  MOUTH DAILY 30 capsule 0  . ranitidine (ZANTAC) 150 MG tablet Take 1 tablet (150 mg total) by mouth 2 (two) times daily. 60 tablet 0  . rOPINIRole (REQUIP) 0.25 MG tablet TAKE THREE TABLETS BY MOUTH EVERY NIGHT AT BEDTIME 270 tablet 0  . ALPRAZolam (XANAX) 0.5 MG tablet Take 1 tablet (0.5 mg total) by mouth daily as needed for anxiety. (Patient not taking: Reported on 11/01/2016) 30 tablet 0  . Chlorphen-Phenyleph-APAP (CORICIDIN D COLD/FLU/SINUS) 2-5-325 MG TABS Take 2 tablets by mouth daily as needed (for nasal congestion/cough). (Patient not taking: Reported on 11/01/2016) 80 each 0  . predniSONE (STERAPRED UNI-PAK 21 TAB) 10 MG (21) TBPK tablet Please take 6 pills in the morning on the day 1, then taper by one pill daily until finished, thank you (Patient not taking: Reported on 11/01/2016) 21 tablet 0  . rOPINIRole (  REQUIP) 0.25 MG tablet Take 3 tablets (0.75 mg total) by mouth at bedtime. 90 tablet 0     Discharge Medications: Please see discharge summary for a list of discharge medications.  Relevant Imaging Results:  Relevant Lab Results:   Additional Information ss: [id number]  Shela Leff, LCSW

## 2016-11-01 NOTE — ED Notes (Signed)
External female cath placed. No skin breakdown noted upon catheter placement. Pt cleaned prior to in and out cath.

## 2016-11-01 NOTE — ED Notes (Signed)
Pt given meal tray.

## 2016-11-01 NOTE — Clinical Social Work Note (Signed)
CSW consulted to see patient for placement. Patient was discharged from [facility] day before yesterday. CSW spoke with Broadus John, their admission's coordinator, and patient was ambulating independently 15 feet to the bathroom and moving around slowly but with the use of a walker. Patient refused to work with PT the last week at the facility and had fired 3 of their nurses from her care. Broadus John stated that she made the comment to their staff "why should I do anything for myself when I have people who can do it for me."   CSW spoke with patient this afternoon and discussed the above with her. Patient stated that she refused therapy because she was in too much pain. CSW explained that refusal of therapy no matter what the cause, will not be covered by insurance. CSW explained that even if PT recommends STR again, that [facility] more than likely will deny her because of her last week at [facility]. CSW discussed long term care with patient and she is not interested in long term care. She does not want to be on medicaid and have to turn over her check to a facility. CSW advised that she may need to return home with home health and then a home health social worker can work on long term placement if patient changes her mind. PT evaluation pending. CSW updated patient's ED nurse, Larene Beach. Shela Leff MSW,LcSW [phone]

## 2016-11-01 NOTE — Evaluation (Signed)
Physical Therapy Evaluation Patient Details Name: Loretta Wade MRN: [id number] DOB: 01/09/1951 Today's Date: 11/01/2016   History of Present Illness  66 y.o. female who was brought to ED with inability to walk per HHPT, after recent stay in SNF for COPD exacerbation related weakness. PMHx: Anemia, CHF, edema, fibromyalgia, nocturia, and OA. Pt. has 2L O2 at home.   Clinical Impression  Pt is up to stand with 2 person assistance but finally was able to scoot up the side of the mat table, and from there could assist back to bed.  She requires mod assist for both mainly due to trunk weakness, but can contribute to all mobility with proper cueing.  Her plan is to continue rehab in [place] and progress therapy to SNF with an eye on her needs for LTC vs ALF.    Follow Up Recommendations SNF    Equipment Recommendations  Rolling walker with 5" wheels    Recommendations for Other Services       Precautions / Restrictions Precautions Precautions: Fall Precaution Comments: telemetry Restrictions Weight Bearing Restrictions: No Other Position/Activity Restrictions: monitor O2 sats and pulse      Mobility  Bed Mobility Overal bed mobility: Needs Assistance Bed Mobility: Supine to Sit;Sit to Supine     Supine to sit: Min assist;Mod assist Sit to supine: Mod assist (for legs)      Transfers Overall transfer level: Needs assistance Equipment used: Rolling walker (2 wheeled) Transfers: Sit to/from Stand Sit to Stand: Min assist;Min guard;+2 physical assistance;+2 safety/equipment;From elevated surface         General transfer comment: pt was able to do for herself to initiate on last effort  Ambulation/Gait             General Gait Details: Pt did not take steps due to back pain complaints  Stairs            Wheelchair Mobility    Modified Rankin (Stroke Patients Only)       Balance Overall balance assessment: Needs assistance Sitting-balance support:  Bilateral upper extremity supported;Feet supported Sitting balance-[name] Scale: Fair Sitting balance - Comments: closely supervised but pt could demonstrate control of sitting     Standing balance-[name] Scale: Poor Standing balance comment: but pt could control with walker for short times and with cues for upright standing                             Pertinent Vitals/Pain Pain Assessment: Faces Faces Pain Scale: Hurts even more Pain Location: back pain which is recent per pt Pain Descriptors / Indicators: Aching;Sore Pain Intervention(s): Limited activity within patient's tolerance;Monitored during session;Repositioned    Home Living Family/patient expects to be discharged to:: Unsure Living Arrangements: Alone Available Help at Discharge: Family;Available PRN/intermittently (mostly working) Type of Home: Apartment Home Access: Level entry     Home Layout: One level Home Equipment: Adaptive equipment;Wheelchair - Scientist, product/process development - 2 wheels;Tub bench;Hand held shower head;Grab bars - tub/shower      Prior Function Level of Independence: Needs assistance   Gait / Transfers Assistance Needed: pt ambulating with RW for household distances  ADL's / Homemaking Assistance Needed: assist her bathing and care of home, cooking        Hand Dominance   Dominant Hand: Right    Extremity/Trunk Assessment   Upper Extremity Assessment Upper Extremity Assessment: Generalized weakness    Lower Extremity Assessment Lower Extremity Assessment: Generalized weakness  Cervical / Trunk Assessment Cervical / Trunk Assessment: Other exceptions (has a great deal of abdominal adipose restricting mobility) Cervical / Trunk Exceptions: stiffness in lumbar spine  Communication   Communication: No difficulties  Cognition Arousal/Alertness: Awake/alert Behavior During Therapy: WFL for tasks assessed/performed Overall Cognitive Status: Within Functional Limits for  tasks assessed                                        General Comments General comments (skin integrity, edema, etc.): Pt is up to stand with assistance, noted her struggle with postural control and complaints of back pain.  Pain is not worse with effort    Exercises     Assessment/Plan    PT Assessment Patient needs continued PT services  PT Problem List Decreased strength;Decreased range of motion;Decreased activity tolerance;Decreased balance;Decreased mobility;Decreased coordination;Decreased knowledge of use of DME;Decreased safety awareness;Cardiopulmonary status limiting activity;Obesity;Decreased skin integrity;Pain       PT Treatment Interventions DME instruction;Gait training;Functional mobility training;Therapeutic activities;Therapeutic exercise;Balance training;Neuromuscular re-education;Patient/family education;Manual techniques    PT Goals (Current goals can be found in the Care Plan section)  Acute Rehab PT Goals Patient Stated Goal: to get stronger PT Goal Formulation: With patient Time For Goal Achievement: 11/15/16 Potential to Achieve Goals: Good    Frequency Min 2X/week   Barriers to discharge Decreased caregiver support home alone many times a day    Co-evaluation               AM-PAC PT "6 Clicks" Daily Activity  Outcome Measure Difficulty turning over in bed (including adjusting bedclothes, sheets and blankets)?: Unable Difficulty moving from lying on back to sitting on the side of the bed? : Unable Difficulty sitting down on and standing up from a chair with arms (e.g., wheelchair, bedside commode, etc,.)?: Unable Help needed moving to and from a bed to chair (including a wheelchair)?: A Lot Help needed walking in hospital room?: A Lot Help needed climbing 3-5 steps with a railing? : Total 6 Click Score: 8    End of Session Equipment Utilized During Treatment: Oxygen Activity Tolerance: Patient limited by fatigue;Other  (comment) (ck of O2 sats with mobility reveals decline to 89% briefly ) Patient left: in bed;with call bell/phone within reach Nurse Communication: Mobility status PT Visit Diagnosis: Unsteadiness on feet (R26.81);Muscle weakness (generalized) (M62.81);Difficulty in walking, not elsewhere classified (R26.2)    Time: 6834-1962 PT Time Calculation (min) (ACUTE ONLY): 41 min   Charges:   PT Evaluation $PT Eval Moderate Complexity: 1 Mod PT Treatments $Therapeutic Activity: 23-37 mins   PT G Codes:   PT G-Codes **NOT FOR INPATIENT CLASS** Functional Assessment Tool Used: AM-PAC 6 Clicks Basic Mobility;Clinical judgement Functional Limitation: Mobility: Walking and moving around Mobility: Walking and Moving Around Current Status (I2979): At least 60 percent but less than 80 percent impaired, limited or restricted Mobility: Walking and Moving Around Goal Status [phone]): At least 1 percent but less than 20 percent impaired, limited or restricted   Ramond Dial 11/01/2016, 4:56 PM   Mee Hives, PT MS Acute Rehab Dept. Number: [place] [facility] [place]

## 2016-11-01 NOTE — ED Triage Notes (Signed)
Pt presents to ED via [facility] from home for lower R back pain that radiates towards front. Also c/o abd pain lower L and R. Pt was DC from [facility] yesterday after being there 20 days for COPD exacerbation. Pt is on 3 L  chronically. Pt also just started antibiotics for UTI. Alert, oriented. Has home health nurse that called EMS because of pain. EMS states pt normally can transfer from bed to wheelchair but couldn't today.

## 2016-11-01 NOTE — Clinical Social Work Note (Signed)
Patient's only bed offer is from [facility]. [facility] states that they will not accept her back because she was not participating and had "fired" three of their nurses. CSW has fowarded PT notes to [place] at [facility] so he can work on Teacher, music prior [place]. Shela Leff MSW,LCSW [phone]

## 2016-11-02 ENCOUNTER — Encounter: Payer: Self-pay | Admitting: Emergency Medicine

## 2016-11-02 MED ORDER — TRAMADOL HCL 50 MG PO TABS
50.0000 mg | ORAL_TABLET | Freq: Four times a day (QID) | ORAL | Status: DC | PRN
  Administered 2016-11-02: 50 mg via ORAL
  Filled 2016-11-02: qty 1

## 2016-11-02 MED ORDER — TRAMADOL HCL 50 MG PO TABS
50.0000 mg | ORAL_TABLET | Freq: Once | ORAL | Status: AC
  Administered 2016-11-02: 50 mg via ORAL

## 2016-11-02 MED ORDER — TRAMADOL HCL 50 MG PO TABS
ORAL_TABLET | ORAL | Status: AC
  Filled 2016-11-02: qty 1

## 2016-11-02 MED ORDER — TRAMADOL HCL 50 MG PO TABS: 50.0000 mg | ORAL_TABLET | Freq: Four times a day (QID) | ORAL | 0 refills | Status: DC | PRN

## 2016-11-02 MED ORDER — OXYBUTYNIN CHLORIDE 5 MG PO TABS
5.0000 mg | ORAL_TABLET | Freq: Every day | ORAL | Status: DC
  Administered 2016-11-02: 5 mg via ORAL
  Filled 2016-11-02: qty 1

## 2016-11-02 MED ORDER — CEPHALEXIN 500 MG PO CAPS: 500.0000 mg | ORAL_CAPSULE | Freq: Four times a day (QID) | ORAL | 0 refills | Status: AC

## 2016-11-02 NOTE — ED Notes (Signed)
Pt is aware that we are waiting on transport home.

## 2016-11-02 NOTE — ED Provider Notes (Signed)
Patient refuses to go anywhere but [facility]. [facility] does not have a bed for her. Per the case manager we will have to send the patient home.   Nena Polio, MD 11/02/16 [phone]

## 2016-11-02 NOTE — ED Notes (Signed)
Pt updated that she had another hour before she could have another pain pill. Nurse also informed pt that we are waiting on EMS to arrive for her tx home. Pt is in NAD at this time and is denying any further needs.

## 2016-11-02 NOTE — Clinical Social Work Note (Signed)
CSW received call from patient's nurse, Minette Brine, stating that patient is now stating to her that she does not want [facility] and that she is not going. CSW advised that patient will have to go home and to contact the ED RN CM and she can arrange home health. Shela Leff MSW,LCSW [phone]

## 2016-11-02 NOTE — ED Notes (Signed)
Nurse spoke with [place] from social work. Pt is still currently waiting on insurance approval for placement.

## 2016-11-02 NOTE — Care Management Note (Signed)
Case Management Note  Patient Details  Name: Loretta Wade MRN: [id number] Date of Birth: [birth year]  Subjective/Objective:   Spoke to pt in her room. She does not want to go to the bed CSW was able to obtain for her at [place]. She only wants to go to [facility] because she liked it there. At this time it was suggested thjat I set up [facility] for the patient. She is already active with [facility] for SN, and they have been notified that the patient is discharging to home. They will see her tomorrow per Carlean Purl  At the agency. The RN for the pt. and the MD are made aware.             Action/Plan:   Expected Discharge Date:                  Expected Discharge Plan:     In-House Referral:     Discharge planning Services     Post Acute Care Choice:    Choice offered to:     DME Arranged:    DME Agency:     HH Arranged:    HH Agency:     Status of Service:     If discussed at [facility] of Stay Meetings, dates discussed:    Additional Comments:  Beau Fanny, RN 11/02/2016, 2:15 PM

## 2016-11-02 NOTE — Clinical Social Work Note (Signed)
Doug with [facility] states that we can send patient on a 5 day LOG while waiting on the [place]. If within 5 days of patient being at the facility, if patient is approved by insurance then she can continue with her therapy in the facility. If insurance denies, patient will have the choice of returning home or pay privately with the facility. CSW has informed patient of this information and she is in agreement. CSW has informed patient's ED nurse, Minette Brine, and patient will transport via EMS. Shela Leff MSW,LcSW [phone]

## 2016-11-02 NOTE — ED Notes (Signed)
Report given to [facility] at [facility].

## 2016-11-02 NOTE — Clinical Social Work Note (Signed)
[place] is working with [employer] this morning to obtain prior auth for placement. Shela Leff MSW,LCSW [phone]

## 2016-11-02 NOTE — ED Notes (Signed)
Pt is refusing to go to [facility] for furtherance of assistance. Brayton Layman was notified and case worker in the ED spoke with pt. Pt is going to be sent home. Pt already has home health care in place per case worker.

## 2016-11-02 NOTE — ED Notes (Signed)
Patient is resting comfortably at this time with no signs of distress present. Equal and unlabored respirations remain. Will continue to monitor.

## 2016-11-02 NOTE — Discharge Instructions (Signed)
Continue to take all your medications as you have been directed. Return for any further problems. Be sure to follow up with your doctor.

## 2016-11-02 NOTE — ED Notes (Signed)
Nurse introduced herself to pt. Pt is currently eating. Pt in NAD at this time.

## 2016-11-02 NOTE — ED Notes (Signed)
Pt repositioned in bed and skin was checked. Pt is dry and skin appears intact. Pt placed on new pad. Pt in NAD at this time.

## 2016-11-02 NOTE — ED Notes (Signed)
Answered call light. Pt wants to know if she has been given all her meds. RN notified.

## 2016-11-02 NOTE — ED Notes (Signed)
Pt brought food. Pt repositioned in bed and helped with food set-up.

## 2016-11-24 ENCOUNTER — Encounter: Payer: Self-pay | Admitting: Emergency Medicine

## 2016-11-24 ENCOUNTER — Emergency Department: Payer: Medicare PPO

## 2016-11-24 ENCOUNTER — Inpatient Hospital Stay
Admission: EM | Admit: 2016-11-24 | Discharge: 2016-11-28 | DRG: 191 | Disposition: A | Discharge status: Home Health | Payer: Medicare PPO | Attending: Internal Medicine | Admitting: Internal Medicine

## 2016-11-24 DIAGNOSIS — R001 Bradycardia, unspecified: Secondary | ICD-10-CM | POA: Diagnosis present

## 2016-11-24 DIAGNOSIS — J9612 Chronic respiratory failure with hypercapnia: Secondary | ICD-10-CM | POA: Diagnosis present

## 2016-11-24 DIAGNOSIS — Z803 Family history of malignant neoplasm of breast: Secondary | ICD-10-CM

## 2016-11-24 DIAGNOSIS — I5032 Chronic diastolic (congestive) heart failure: Secondary | ICD-10-CM | POA: Diagnosis present

## 2016-11-24 DIAGNOSIS — Z23 Encounter for immunization: Secondary | ICD-10-CM

## 2016-11-24 DIAGNOSIS — Z8249 Family history of ischemic heart disease and other diseases of the circulatory system: Secondary | ICD-10-CM

## 2016-11-24 DIAGNOSIS — K210 Gastro-esophageal reflux disease with esophagitis: Secondary | ICD-10-CM | POA: Diagnosis present

## 2016-11-24 DIAGNOSIS — J441 Chronic obstructive pulmonary disease with (acute) exacerbation: Principal | ICD-10-CM | POA: Diagnosis present

## 2016-11-24 DIAGNOSIS — Z87891 Personal history of nicotine dependence: Secondary | ICD-10-CM

## 2016-11-24 DIAGNOSIS — M797 Fibromyalgia: Secondary | ICD-10-CM | POA: Diagnosis present

## 2016-11-24 DIAGNOSIS — K219 Gastro-esophageal reflux disease without esophagitis: Secondary | ICD-10-CM | POA: Diagnosis present

## 2016-11-24 DIAGNOSIS — R079 Chest pain, unspecified: Secondary | ICD-10-CM | POA: Diagnosis present

## 2016-11-24 DIAGNOSIS — Z9981 Dependence on supplemental oxygen: Secondary | ICD-10-CM

## 2016-11-24 DIAGNOSIS — Z6841 Body Mass Index (BMI) 40.0 and over, adult: Secondary | ICD-10-CM

## 2016-11-24 DIAGNOSIS — E662 Morbid (severe) obesity with alveolar hypoventilation: Secondary | ICD-10-CM | POA: Diagnosis present

## 2016-11-24 DIAGNOSIS — R0603 Acute respiratory distress: Secondary | ICD-10-CM | POA: Diagnosis not present

## 2016-11-24 DIAGNOSIS — I110 Hypertensive heart disease with heart failure: Secondary | ICD-10-CM | POA: Diagnosis present

## 2016-11-24 DIAGNOSIS — Z96651 Presence of right artificial knee joint: Secondary | ICD-10-CM | POA: Diagnosis present

## 2016-11-24 HISTORY — DX: Other chronic pain: G89.29

## 2016-11-24 HISTORY — DX: Dorsalgia, unspecified: M54.9

## 2016-11-24 LAB — CBC WITH DIFFERENTIAL/PLATELET
Basophils Absolute: 0.1 10*3/uL (ref 0–0.1)
Basophils Relative: 1 %
EOS ABS: 0.2 10*3/uL (ref 0–0.7)
Eosinophils Relative: 3 %
HCT: 34.5 % — ABNORMAL LOW (ref 35.0–47.0)
HEMOGLOBIN: 11.3 g/dL — AB (ref 12.0–16.0)
LYMPHS ABS: 2 10*3/uL (ref 1.0–3.6)
Lymphocytes Relative: 26 %
MCH: 31.5 pg (ref 26.0–34.0)
MCHC: 32.8 g/dL (ref 32.0–36.0)
MCV: 96.2 fL (ref 80.0–100.0)
MONOS PCT: 17 %
Monocytes Absolute: 1.3 10*3/uL — ABNORMAL HIGH (ref 0.2–0.9)
Neutro Abs: 4.2 10*3/uL (ref 1.4–6.5)
Neutrophils Relative %: 53 %
Platelets: 200 10*3/uL (ref 150–440)
RBC: 3.59 MIL/uL — ABNORMAL LOW (ref 3.80–5.20)
RDW: 15 % — ABNORMAL HIGH (ref 11.5–14.5)
WBC: 7.7 10*3/uL (ref 3.6–11.0)

## 2016-11-24 LAB — BASIC METABOLIC PANEL
Anion gap: 8 (ref 5–15)
BUN: 13 mg/dL (ref 6–20)
CHLORIDE: 102 mmol/L (ref 101–111)
CO2: 32 mmol/L (ref 22–32)
Calcium: 9 mg/dL (ref 8.9–10.3)
Creatinine, Ser: 0.74 mg/dL (ref 0.44–1.00)
GFR calc non Af Amer: >60 mL/min (ref >60)
Glucose, Bld: 135 mg/dL — ABNORMAL HIGH (ref 65–99)
POTASSIUM: 4.3 mmol/L (ref 3.5–5.1)
SODIUM: 142 mmol/L (ref 135–145)

## 2016-11-24 LAB — TROPONIN I: Troponin I: <0.03 ng/mL (ref <0.03)

## 2016-11-24 LAB — LACTIC ACID, PLASMA: Lactic Acid, Venous: 1 mmol/L (ref 0.5–1.9)

## 2016-11-24 MED ORDER — ALBUTEROL SULFATE (2.5 MG/3ML) 0.083% IN NEBU
INHALATION_SOLUTION | RESPIRATORY_TRACT | Status: AC
  Filled 2016-11-24: qty 6

## 2016-11-24 MED ORDER — MAGNESIUM SULFATE 2 GM/50ML IV SOLN
2.0000 g | Freq: Once | INTRAVENOUS | Status: AC
  Administered 2016-11-24: 2 g via INTRAVENOUS
  Filled 2016-11-24: qty 50

## 2016-11-24 MED ORDER — IPRATROPIUM-ALBUTEROL 0.5-2.5 (3) MG/3ML IN SOLN
RESPIRATORY_TRACT | Status: AC
  Filled 2016-11-24: qty 6

## 2016-11-24 MED ORDER — IPRATROPIUM-ALBUTEROL 0.5-2.5 (3) MG/3ML IN SOLN
3.0000 mL | Freq: Once | RESPIRATORY_TRACT | Status: AC
  Administered 2016-11-24: via RESPIRATORY_TRACT
  Filled 2016-11-24: qty 9

## 2016-11-24 MED ORDER — CEFEPIME HCL 2 G IJ SOLR
2.0000 g | Freq: Once | INTRAMUSCULAR | Status: AC
  Administered 2016-11-24: 2 g via INTRAVENOUS
  Filled 2016-11-24: qty 2

## 2016-11-24 MED ORDER — VANCOMYCIN HCL IN DEXTROSE 1-5 GM/200ML-% IV SOLN
1000.0000 mg | Freq: Once | INTRAVENOUS | Status: AC
  Administered 2016-11-25: 1000 mg via INTRAVENOUS
  Filled 2016-11-24: qty 200

## 2016-11-24 MED ORDER — ALBUTEROL SULFATE (2.5 MG/3ML) 0.083% IN NEBU
2.5000 mg | INHALATION_SOLUTION | Freq: Once | RESPIRATORY_TRACT | Status: AC
  Administered 2016-11-24: via RESPIRATORY_TRACT

## 2016-11-24 NOTE — ED Triage Notes (Signed)
Pt bib [facility] from home d/t respiratory distress. Pt called PCP who recommended waiting to be seen in the morning. Pt states SHOB since discharge x2 weeks, worsening today. Pt WOB increased, tight, decreased air movement in lungs.

## 2016-11-24 NOTE — ED Notes (Signed)
ED Provider at bedside. 

## 2016-11-24 NOTE — ED Provider Notes (Signed)
[facility] Emergency Department Provider Note    First MD Initiated Contact with Patient 11/24/16 2222     (approximate)  I have reviewed the triage vital signs and the nursing notes.   HISTORY  Chief Complaint Respiratory Distress    HPI Loretta Wade is a 66 y.o. female with history of congestive heart failure as well as COPD on 2 L nasal cannula at home presents with worsening shortness of breath and productive cough starting yesterday progressing to respiratory distress at this evening. Patient having difficulty speaking in more than 2-3 word sentences. Is having a productive cough. Was just discharged from hospitalization 2 weeks ago.  Denies any nausea or vomiting. No measured fevers at home. Has had some dysuria.   Past Medical History:  Diagnosis Date  . Anemia   . CHF (congestive heart failure) ([place])   . Chronic back pain   . COPD (chronic obstructive pulmonary disease) (HCC)    on 2L home o2  . Edema   . Esophageal reflux   . Fatigue   . Fibromyalgia    Following with pain management  . Hypertension   . Nocturia   . OA (osteoarthritis)   . Sleep apnea    not on CPAP   Family History  Problem Relation Age of Onset  . Hypertension Mother   . Diabetes Unknown   . Breast cancer Daughter    Past Surgical History:  Procedure Laterality Date  . ABDOMINAL HYSTERECTOMY    . CHOLECYSTECTOMY    . TOTAL KNEE ARTHROPLASTY     right  . VESICOVAGINAL FISTULA CLOSURE W/ TAH     Patient Active Problem List   Diagnosis Date Noted  . Candidal intertrigo 02/08/2016  . Dysuria 02/08/2016  . Requires assistance with activities of daily living (ADL) 02/08/2016  . Oral thrush 11/10/2015  . Tachypnea 11/01/2015  . Acute non-recurrent maxillary sinusitis 10/29/2015  . Fall 07/11/2015  . Dyslipidemia 06/17/2015  . Overflow stress incontinence of urine in female 06/17/2015  . Chronic pain 06/10/2015  . Chronic low back pain (Location of  Tertiary source of pain) (Bilateral) (R>L) 06/10/2015  . Chronic shoulder pain (Location of Secondary source of pain) (Left) 06/10/2015  . Costochondritis (Location of Primary Source of Pain) 06/10/2015  . Long term current use of opiate analgesic 06/10/2015  . Long term prescription opiate use 06/10/2015  . Opiate use 06/10/2015  . Encounter for therapeutic drug level monitoring 06/10/2015  . Encounter for pain management planning 06/10/2015  . Chronic feet pain (Bilateral) (R>L) 06/10/2015  . Osteoarthritis 06/10/2015  . Osteoarthritis of shoulder (Left) 06/10/2015  . COPD exacerbation ([place]) 04/12/2015  . Poor mobility 04/12/2015  . Fibromyalgia 03/23/2015  . GERD (gastroesophageal reflux disease) 03/23/2015  . MRSA infection 11/17/2014  . Diastolic CHF, acute on chronic (HCC) 11/12/2014  . Morbid obesity with BMI of 50.0-59.9, adult ([place]) 11/12/2014  . Neck pain, chronic 11/12/2014  . COPD (chronic obstructive pulmonary disease) ([place]) 11/04/2014  . Urinary tract infection 10/02/2014      Prior to Admission medications   Medication Sig Start Date End Date Taking? Authorizing Provider  acetaminophen (TYLENOL) 325 MG tablet Take 2 tablets (650 mg total) by mouth every 6 (six) hours as needed for mild pain (or Fever >/= 101). 11/17/14   Gladstone Lighter, MD  albuterol (PROVENTIL) (2.5 MG/3ML) 0.083% nebulizer solution USE ONE TREATMENT EVERY 6 HOURS AS NEEDED FOR WHEEZING OR SHORTNESS OF BREATH 05/16/16   Keith Rake Asad A,  MD  ALPRAZolam (XANAX) 0.5 MG tablet Take 1 tablet (0.5 mg total) by mouth daily as needed for anxiety. Patient not taking: Reported on 11/01/2016 10/11/16   Theodoro Grist, MD  [facility] ELLIPTA 62.5-25 MCG/INH AEPB Inhale 1 puff into the lungs daily. 10/06/15   [provider]  Chlorphen-Phenyleph-APAP (CORICIDIN D COLD/FLU/SINUS) 2-5-325 MG TABS Take 2 tablets by mouth daily as needed (for nasal congestion/cough). Patient not taking: Reported on 11/01/2016 10/11/16    Theodoro Grist, MD  chlorpheniramine-HYDROcodone (TUSSIONEX) 10-8 MG/5ML SUER Take 5 mLs by mouth every 12 (twelve) hours as needed for cough. 10/11/16   Theodoro Grist, MD  cholecalciferol (VITAMIN D) 1000 UNITS tablet Take 1,000 Units by mouth daily.     [provider]  ciprofloxacin-dexamethasone (CIPRODEX) OTIC suspension Place 4 drops into the left ear 2 (two) times daily. 10/11/16   Theodoro Grist, MD  citalopram (CELEXA) 20 MG tablet TAKE ONE (1) TABLET BY MOUTH EVERY DAY 05/16/16   Roselee Nova, MD  colesevelam [facility]) 625 MG tablet TAKE ONE TABLET TWICE A DAY WITH FOOD 05/16/16   Roselee Nova, MD  cyanocobalamin 500 MCG tablet Take 500 mcg by mouth daily.    [provider]  dexlansoprazole (DEXILANT) 60 MG capsule TAKE ONE (1) CAPSULE EACH DAY 05/16/16   Roselee Nova, MD  fluticasone [facility]) 50 MCG/ACT nasal spray USE TWO SPRAYS IN EACH NOSTRIL EVERY DAY 07/06/16   Ancil Boozer, Drue Stager, MD  furosemide (LASIX) 40 MG tablet Take 1 tablet (40 mg total) by mouth 2 (two) times daily. 02/08/15   Dustin Flock, MD  guaiFENesin (MUCINEX) 600 MG 12 hr tablet Take 1 tablet (600 mg total) by mouth 2 (two) times daily. 10/11/16   Theodoro Grist, MD  guaiFENesin-dextromethorphan (ROBITUSSIN DM) 100-10 MG/5ML syrup Take 5-10 mLs by mouth every 4 (four) hours as needed for cough.     [provider]  hydrocortisone (ANUSOL-HC) 25 MG suppository Place 1 suppository (25 mg total) rectally 2 (two) times daily. 10/11/16   Theodoro Grist, MD  loperamide (IMODIUM) 2 MG capsule Take 2-4 mg by mouth as needed for diarrhea or loose stools.    [provider]  Magnesium 250 MG TABS Take 250 mg by mouth daily.    [provider]  mouth rinse LIQD solution 15 mLs by Mouth Rinse route 2 (two) times daily. 10/11/16   Theodoro Grist, MD  nystatin (MYCOSTATIN) 100000 UNIT/ML suspension Take 5 mLs (500,000 Units total) by mouth 4 (four) times daily. 11/10/15   Roselee Nova, MD  nystatin ointment (MYCOSTATIN) Apply 1 application topically 3 (three) times daily. 02/25/16   Steele Sizer, MD  oxybutynin (DITROPAN) 5 MG tablet TAKE ONE (1) TABLET BY MOUTH EVERY DAY 05/16/16   Roselee Nova, MD  predniSONE (STERAPRED UNI-PAK 21 TAB) 10 MG (21) TBPK tablet Please take 6 pills in the morning on the day 1, then taper by one pill daily until finished, thank you Patient not taking: Reported on 11/01/2016 10/11/16   Theodoro Grist, MD  pregabalin (LYRICA) 150 MG capsule Take 1 capsule (150 mg total) by mouth 2 (two) times daily. 05/16/16   Roselee Nova, MD  propranolol ER (INDERAL LA) 80 MG 24 hr capsule TAKE ONE CAPSULE BY MOUTH DAILY 06/06/16   Roselee Nova, MD  ranitidine (ZANTAC) 150 MG tablet Take 1 tablet (150 mg total) by mouth 2 (two) times daily. 05/16/16   Roselee Nova,  MD  rOPINIRole (REQUIP) 0.25 MG tablet TAKE THREE TABLETS BY MOUTH EVERY NIGHT AT BEDTIME 11/15/15   Roselee Nova, MD  rOPINIRole (REQUIP) 0.25 MG tablet Take 3 tablets (0.75 mg total) by mouth at bedtime. 05/16/16   Roselee Nova, MD  traMADol (ULTRAM) 50 MG tablet Take 1 tablet (50 mg total) by mouth every 6 (six) hours as needed. 11/02/16 11/02/17  Nena Polio, MD    Allergies Gabapentin    Social History Social History  Substance Use Topics  . Smoking status: Former Smoker    Quit date: 11/04/1983  . Smokeless tobacco: Never Used     Comment: quit several years ago - almost 30 years ago  . Alcohol use No    Review of Systems Patient denies headaches, rhinorrhea, blurry vision, numbness, shortness of breath, chest pain, edema, cough, abdominal pain, nausea, vomiting, diarrhea, dysuria, fevers, rashes or hallucinations unless otherwise stated above in HPI. ____________________________________________   PHYSICAL EXAM:  VITAL SIGNS: Vitals:   11/24/16 2209  BP: (!) 142/71  Pulse: 78  Resp: (!) 28  Temp: 98.3 F (36.8 C)  SpO2: 96%    Constitutional:  Alert, acutely ill appearing in mod respiratory distress. Eyes: Conjunctivae are normal.  Head: Atraumatic. Nose: No congestion/rhinnorhea. Mouth/Throat: Mucous membranes are moist.   Neck: No stridor. Painless ROM.  Cardiovascular: Normal rate, regular rhythm. Grossly normal heart sounds.  Good peripheral circulation. Respiratory: tachypnea with diminished air movement bilaterally Gastrointestinal: obese, Soft and nontender. No distention. No abdominal bruits. No CVA tenderness. Genitourinary:  Musculoskeletal: No lower extremity tenderness, ++ bilatearl edema.  No joint effusions. Neurologic: . No gross focal neurologic deficits are appreciated. No facial droop Skin:  Skin is warm, dry and intact. No rash noted. Psychiatric: unable to assess due to resp distress  ____________________________________________   LABS (all labs ordered are listed, but only abnormal results are displayed)  Results for orders placed or performed during the hospital encounter of 11/24/16 (from the past 24 hour(s))  Basic metabolic panel     Status: Abnormal   Collection Time: 11/24/16 10:09 PM  Result Value Ref Range   Sodium 142 135 - 145 mmol/L   Potassium 4.3 3.5 - 5.1 mmol/L   Chloride 102 101 - 111 mmol/L   CO2 32 22 - 32 mmol/L   Glucose, Bld 135 (H) 65 - 99 mg/dL   BUN 13 6 - 20 mg/dL   Creatinine, Ser 0.74 0.44 - 1.00 mg/dL   Calcium 9.0 8.9 - 10.3 mg/dL   GFR calc non Af Amer >60 >60 mL/min   GFR calc Af Amer >60 >60 mL/min   Anion gap 8 5 - 15  Troponin I     Status: None   Collection Time: 11/24/16 10:09 PM  Result Value Ref Range   Troponin I <0.03 <0.03 ng/mL  CBC WITH DIFFERENTIAL     Status: Abnormal   Collection Time: 11/24/16 10:09 PM  Result Value Ref Range   WBC 7.7 3.6 - 11.0 K/uL   RBC 3.59 (L) 3.80 - 5.20 MIL/uL   Hemoglobin 11.3 (L) 12.0 - 16.0 g/dL   HCT 34.5 (L) 35.0 - 47.0 %   MCV 96.2 80.0 - 100.0 fL   MCH 31.5 26.0 - 34.0 pg   MCHC 32.8 32.0 - 36.0 g/dL   RDW  15.0 (H) 11.5 - 14.5 %   Platelets 200 150 - 440 K/uL   Neutrophils Relative % 53 %   Neutro Abs 4.2  1.4 - 6.5 K/uL   Lymphocytes Relative 26 %   Lymphs Abs 2.0 1.0 - 3.6 K/uL   Monocytes Relative 17 %   Monocytes Absolute 1.3 (H) 0.2 - 0.9 K/uL   Eosinophils Relative 3 %   Eosinophils Absolute 0.2 0 - 0.7 K/uL   Basophils Relative 1 %   Basophils Absolute 0.1 0 - 0.1 K/uL  Lactic acid, plasma     Status: None   Collection Time: 11/24/16 10:23 PM  Result Value Ref Range   Lactic Acid, Venous 1.0 0.5 - 1.9 mmol/L   ____________________________________________  EKG My review and personal interpretation at Time: 22:06   Indication: sob  Rate: 80  Rhythm: sinus Axis: normal Other: resp variation, limited interpretation 2/2 baseline artifact, no stemi noted ____________________________________________  RADIOLOGY  I personally reviewed all radiographic images ordered to evaluate for the above acute complaints and reviewed radiology reports and findings.  These findings were personally discussed with the patient.  Please see medical record for radiology report.  ____________________________________________   PROCEDURES  Procedure(s) performed:  Procedures    Critical Care performed: yes CRITICAL CARE Performed by: Merlyn Lot   Total critical care time: 35 minutes  Critical care time was exclusive of separately billable procedures and treating other patients.  Critical care was necessary to treat or prevent imminent or life-threatening deterioration.  Critical care was time spent personally by me on the following activities: development of treatment plan with patient and/or surrogate as well as nursing, discussions with consultants, evaluation of patient's response to treatment, examination of patient, obtaining history from patient or surrogate, ordering and performing treatments and interventions, ordering and review of laboratory studies, ordering and review of  radiographic studies, pulse oximetry and re-evaluation of patient's condition.  ____________________________________________   INITIAL IMPRESSION / ASSESSMENT AND PLAN / ED COURSE  Pertinent labs & imaging results that were available during my care of the patient were reviewed by me and considered in my medical decision making (see chart for details).  DDX: Asthma, copd, CHF, pna, ptx, malignancy, Pe, anemia   Loretta Wade is a 66 y.o. who presents to the ED with acute respiratory distress. Patient placed on BiPAP due to significant work of breathing with diminished breath sounds. Presentation quite tenuous initially due to her significant work of breathing, morbid obesity and multiple comorbidities.  Will start IV mag, continuous nebulizers, CXR.  The patient will be placed on continuous pulse oximetry and telemetry for monitoring.  Laboratory evaluation will be sent to evaluate for the above complaints.     Clinical Course as of Nov 25 2347  Fri Nov 24, 2016  2317 patient reassessed with improvement in respirations. Now pulling much better tidal volumes on BiPAP. We'll continue with current plan of therapy and admission ICU.  [PR]  2348 patient continues to improve on BiPAP. Pulling tidal volumes in the 500. Also improving on continuous nebulizer. Spoke with Dr. Jannifer Franklin of hospitalist group who kindly agrees to admit patient for further evaluation and management.  [PR]    Clinical Course User Index [PR] Merlyn Lot, MD     ____________________________________________   FINAL CLINICAL IMPRESSION(S) / ED DIAGNOSES  Final diagnoses:  Acute respiratory distress  COPD exacerbation ([place])      NEW MEDICATIONS STARTED DURING THIS VISIT:  New Prescriptions   No medications on file     Note:  This document was prepared using Dragon voice recognition software and may include unintentional dictation errors.    Merlyn Lot,  MD 11/24/16 2351

## 2016-11-25 ENCOUNTER — Encounter: Payer: Self-pay | Admitting: *Deleted

## 2016-11-25 DIAGNOSIS — R0603 Acute respiratory distress: Secondary | ICD-10-CM | POA: Diagnosis present

## 2016-11-25 DIAGNOSIS — J9612 Chronic respiratory failure with hypercapnia: Secondary | ICD-10-CM | POA: Diagnosis present

## 2016-11-25 DIAGNOSIS — R079 Chest pain, unspecified: Secondary | ICD-10-CM | POA: Diagnosis present

## 2016-11-25 DIAGNOSIS — Z9981 Dependence on supplemental oxygen: Secondary | ICD-10-CM | POA: Diagnosis not present

## 2016-11-25 DIAGNOSIS — R001 Bradycardia, unspecified: Secondary | ICD-10-CM | POA: Diagnosis present

## 2016-11-25 DIAGNOSIS — Z8249 Family history of ischemic heart disease and other diseases of the circulatory system: Secondary | ICD-10-CM | POA: Diagnosis not present

## 2016-11-25 DIAGNOSIS — J441 Chronic obstructive pulmonary disease with (acute) exacerbation: Principal | ICD-10-CM

## 2016-11-25 DIAGNOSIS — Z23 Encounter for immunization: Secondary | ICD-10-CM | POA: Diagnosis not present

## 2016-11-25 DIAGNOSIS — I110 Hypertensive heart disease with heart failure: Secondary | ICD-10-CM | POA: Diagnosis present

## 2016-11-25 DIAGNOSIS — Z6841 Body Mass Index (BMI) 40.0 and over, adult: Secondary | ICD-10-CM | POA: Diagnosis not present

## 2016-11-25 DIAGNOSIS — Z803 Family history of malignant neoplasm of breast: Secondary | ICD-10-CM | POA: Diagnosis not present

## 2016-11-25 DIAGNOSIS — Z96651 Presence of right artificial knee joint: Secondary | ICD-10-CM | POA: Diagnosis present

## 2016-11-25 DIAGNOSIS — M797 Fibromyalgia: Secondary | ICD-10-CM | POA: Diagnosis present

## 2016-11-25 DIAGNOSIS — E662 Morbid (severe) obesity with alveolar hypoventilation: Secondary | ICD-10-CM | POA: Diagnosis present

## 2016-11-25 DIAGNOSIS — Z87891 Personal history of nicotine dependence: Secondary | ICD-10-CM | POA: Diagnosis not present

## 2016-11-25 DIAGNOSIS — I5032 Chronic diastolic (congestive) heart failure: Secondary | ICD-10-CM | POA: Diagnosis present

## 2016-11-25 DIAGNOSIS — K210 Gastro-esophageal reflux disease with esophagitis: Secondary | ICD-10-CM | POA: Diagnosis present

## 2016-11-25 LAB — URINALYSIS, COMPLETE (UACMP) WITH MICROSCOPIC
Bilirubin Urine: NEGATIVE
GLUCOSE, UA: NEGATIVE mg/dL
HGB URINE DIPSTICK: NEGATIVE
Ketones, ur: NEGATIVE mg/dL
Nitrite: NEGATIVE
Protein, ur: NEGATIVE mg/dL
SPECIFIC GRAVITY, URINE: 1.026 (ref 1.005–1.030)
pH: 5 (ref 5.0–8.0)

## 2016-11-25 LAB — MRSA PCR SCREENING: MRSA BY PCR: NEGATIVE

## 2016-11-25 LAB — BASIC METABOLIC PANEL
Anion gap: 7 (ref 5–15)
BUN: 14 mg/dL (ref 6–20)
CALCIUM: 9.2 mg/dL (ref 8.9–10.3)
CO2: 32 mmol/L (ref 22–32)
CREATININE: 0.64 mg/dL (ref 0.44–1.00)
Chloride: 102 mmol/L (ref 101–111)
GLUCOSE: 170 mg/dL — AB (ref 65–99)
Potassium: 4.3 mmol/L (ref 3.5–5.1)
Sodium: 141 mmol/L (ref 135–145)

## 2016-11-25 LAB — BLOOD GAS, VENOUS
Acid-Base Excess: 7.9 mmol/L — ABNORMAL HIGH (ref 0.0–2.0)
Bicarbonate: 35.9 mmol/L — ABNORMAL HIGH (ref 20.0–28.0)
Delivery systems: POSITIVE
FIO2: 0.4
MECHANICAL RATE: 10
Mode: POSITIVE
O2 SAT: 92.5 %
PATIENT TEMPERATURE: 37
PCO2 VEN: 68 mmHg — AB (ref 44.0–60.0)
PEEP/CPAP: 5 cmH2O
PH VEN: 7.33 (ref 7.250–7.430)
PO2 VEN: 70 mmHg — AB (ref 32.0–45.0)
Pressure support: 12 cmH2O
RATE: 33 resp/min

## 2016-11-25 LAB — CBC
HCT: 35 % (ref 35.0–47.0)
Hemoglobin: 11.4 g/dL — ABNORMAL LOW (ref 12.0–16.0)
MCH: 30.9 pg (ref 26.0–34.0)
MCHC: 32.5 g/dL (ref 32.0–36.0)
MCV: 95.2 fL (ref 80.0–100.0)
PLATELETS: 195 10*3/uL (ref 150–440)
RBC: 3.68 MIL/uL — ABNORMAL LOW (ref 3.80–5.20)
RDW: 15.3 % — ABNORMAL HIGH (ref 11.5–14.5)
WBC: 6.6 10*3/uL (ref 3.6–11.0)

## 2016-11-25 LAB — PROCALCITONIN: Procalcitonin: <0.1 ng/mL

## 2016-11-25 LAB — PHOSPHORUS: Phosphorus: 2.8 mg/dL (ref 2.5–4.6)

## 2016-11-25 LAB — MAGNESIUM: Magnesium: 2.2 mg/dL (ref 1.7–2.4)

## 2016-11-25 LAB — GLUCOSE, CAPILLARY: GLUCOSE-CAPILLARY: 183 mg/dL — AB (ref 65–99)

## 2016-11-25 MED ORDER — PROPRANOLOL HCL ER 80 MG PO CP24
80.0000 mg | ORAL_CAPSULE | Freq: Every day | ORAL | Status: DC
  Administered 2016-11-25: 80 mg via ORAL
  Filled 2016-11-25 (×2): qty 1

## 2016-11-25 MED ORDER — CHLORHEXIDINE GLUCONATE 0.12 % MT SOLN
15.0000 mL | Freq: Two times a day (BID) | OROMUCOSAL | Status: DC
  Administered 2016-11-25 – 2016-11-26 (×4): 15 mL via OROMUCOSAL
  Filled 2016-11-25 (×2): qty 15

## 2016-11-25 MED ORDER — GUAIFENESIN ER 600 MG PO TB12
600.0000 mg | ORAL_TABLET | Freq: Two times a day (BID) | ORAL | Status: DC
  Administered 2016-11-25 – 2016-11-28 (×7): 600 mg via ORAL
  Filled 2016-11-25 (×7): qty 1

## 2016-11-25 MED ORDER — HYDROCODONE-ACETAMINOPHEN 5-325 MG PO TABS
1.0000 | ORAL_TABLET | ORAL | Status: DC | PRN
  Administered 2016-11-25 – 2016-11-26 (×4): 2 via ORAL
  Administered 2016-11-27 (×2): 1 via ORAL
  Administered 2016-11-27 – 2016-11-28 (×4): 2 via ORAL
  Filled 2016-11-25 (×6): qty 2
  Filled 2016-11-25: qty 1
  Filled 2016-11-25 (×2): qty 2
  Filled 2016-11-25: qty 1

## 2016-11-25 MED ORDER — CITALOPRAM HYDROBROMIDE 20 MG PO TABS
20.0000 mg | ORAL_TABLET | Freq: Every day | ORAL | Status: DC
  Administered 2016-11-25 – 2016-11-28 (×4): 20 mg via ORAL
  Filled 2016-11-25 (×4): qty 1

## 2016-11-25 MED ORDER — UMECLIDINIUM-VILANTEROL 62.5-25 MCG/INH IN AEPB
1.0000 | INHALATION_SPRAY | Freq: Every day | RESPIRATORY_TRACT | Status: DC
  Administered 2016-11-25: 1 via RESPIRATORY_TRACT
  Filled 2016-11-25: qty 14

## 2016-11-25 MED ORDER — PREGABALIN 75 MG PO CAPS
150.0000 mg | ORAL_CAPSULE | Freq: Two times a day (BID) | ORAL | Status: DC
  Administered 2016-11-25 – 2016-11-28 (×7): 150 mg via ORAL
  Filled 2016-11-25 (×7): qty 2

## 2016-11-25 MED ORDER — ROPINIROLE HCL 0.5 MG PO TABS
0.7500 mg | ORAL_TABLET | Freq: Every day | ORAL | Status: DC
  Administered 2016-11-25: 0.75 mg via ORAL
  Filled 2016-11-25 (×2): qty 1

## 2016-11-25 MED ORDER — METHYLPREDNISOLONE SODIUM SUCC 125 MG IJ SOLR
60.0000 mg | Freq: Four times a day (QID) | INTRAMUSCULAR | Status: DC
  Administered 2016-11-25 (×2): 60 mg via INTRAVENOUS
  Filled 2016-11-25 (×2): qty 2

## 2016-11-25 MED ORDER — PANTOPRAZOLE SODIUM 40 MG PO TBEC
40.0000 mg | DELAYED_RELEASE_TABLET | Freq: Every day | ORAL | Status: DC
  Administered 2016-11-25 – 2016-11-28 (×4): 40 mg via ORAL
  Filled 2016-11-25 (×4): qty 1

## 2016-11-25 MED ORDER — DEXTROSE 5 % IV SOLN
2.0000 g | Freq: Three times a day (TID) | INTRAVENOUS | Status: DC
  Administered 2016-11-25 – 2016-11-26 (×3): 2 g via INTRAVENOUS
  Filled 2016-11-25 (×5): qty 2

## 2016-11-25 MED ORDER — ONDANSETRON HCL 4 MG/2ML IJ SOLN: 4.0000 mg | Freq: Four times a day (QID) | INTRAMUSCULAR | Status: DC | PRN

## 2016-11-25 MED ORDER — ACETAMINOPHEN 325 MG PO TABS
650.0000 mg | ORAL_TABLET | Freq: Four times a day (QID) | ORAL | Status: DC | PRN
  Administered 2016-11-25 (×2): 650 mg via ORAL
  Filled 2016-11-25 (×2): qty 2

## 2016-11-25 MED ORDER — ACETAMINOPHEN 650 MG RE SUPP: 650.0000 mg | Freq: Four times a day (QID) | RECTAL | Status: DC | PRN

## 2016-11-25 MED ORDER — PNEUMOCOCCAL VAC POLYVALENT 25 MCG/0.5ML IJ INJ
0.5000 mL | INJECTION | INTRAMUSCULAR | Status: AC
  Administered 2016-11-26: 0.5 mL via INTRAMUSCULAR
  Filled 2016-11-25: qty 0.5

## 2016-11-25 MED ORDER — OXYBUTYNIN CHLORIDE 5 MG PO TABS
5.0000 mg | ORAL_TABLET | Freq: Every day | ORAL | Status: DC
  Administered 2016-11-25 – 2016-11-28 (×4): 5 mg via ORAL
  Filled 2016-11-25 (×5): qty 1

## 2016-11-25 MED ORDER — INFLUENZA VAC SPLIT QUAD 0.5 ML IM SUSY
0.5000 mL | PREFILLED_SYRINGE | INTRAMUSCULAR | Status: AC
  Administered 2016-11-27: 10:00:00 0.5 mL via INTRAMUSCULAR
  Filled 2016-11-25 (×2): qty 0.5

## 2016-11-25 MED ORDER — HYDROCOD POLST-CPM POLST ER 10-8 MG/5ML PO SUER
5.0000 mL | Freq: Two times a day (BID) | ORAL | Status: DC | PRN
  Administered 2016-11-25 – 2016-11-28 (×6): 5 mL via ORAL
  Filled 2016-11-25 (×6): qty 5

## 2016-11-25 MED ORDER — METHYLPREDNISOLONE SODIUM SUCC 40 MG IJ SOLR
40.0000 mg | Freq: Two times a day (BID) | INTRAMUSCULAR | Status: DC
  Administered 2016-11-25 – 2016-11-28 (×6): 40 mg via INTRAVENOUS
  Filled 2016-11-25 (×6): qty 1

## 2016-11-25 MED ORDER — ENOXAPARIN SODIUM 40 MG/0.4ML ~~LOC~~ SOLN
40.0000 mg | Freq: Two times a day (BID) | SUBCUTANEOUS | Status: DC
  Administered 2016-11-25 – 2016-11-28 (×7): 40 mg via SUBCUTANEOUS
  Filled 2016-11-25 (×7): qty 0.4

## 2016-11-25 MED ORDER — HYDROCODONE-ACETAMINOPHEN 5-325 MG PO TABS
1.0000 | ORAL_TABLET | Freq: Once | ORAL | Status: AC
  Administered 2016-11-25: 1 via ORAL
  Filled 2016-11-25: qty 1

## 2016-11-25 MED ORDER — ORAL CARE MOUTH RINSE
15.0000 mL | Freq: Two times a day (BID) | OROMUCOSAL | Status: DC
  Administered 2016-11-25 – 2016-11-27 (×4): 15 mL via OROMUCOSAL

## 2016-11-25 MED ORDER — ONDANSETRON HCL 4 MG PO TABS: 4.0000 mg | ORAL_TABLET | Freq: Four times a day (QID) | ORAL | Status: DC | PRN

## 2016-11-25 MED ORDER — VANCOMYCIN HCL 10 G IV SOLR
1250.0000 mg | Freq: Three times a day (TID) | INTRAVENOUS | Status: DC
  Administered 2016-11-25: 1250 mg via INTRAVENOUS
  Filled 2016-11-25 (×3): qty 1250

## 2016-11-25 MED ORDER — FUROSEMIDE 40 MG PO TABS
40.0000 mg | ORAL_TABLET | Freq: Two times a day (BID) | ORAL | Status: DC
  Administered 2016-11-25 – 2016-11-28 (×7): 40 mg via ORAL
  Filled 2016-11-25 (×3): qty 1
  Filled 2016-11-25: qty 2
  Filled 2016-11-25 (×2): qty 1
  Filled 2016-11-25: qty 2

## 2016-11-25 MED ORDER — FLUTICASONE PROPIONATE 50 MCG/ACT NA SUSP
2.0000 | Freq: Every day | NASAL | Status: DC
  Administered 2016-11-25 – 2016-11-28 (×4): 2 via NASAL
  Filled 2016-11-25 (×2): qty 16

## 2016-11-25 NOTE — Clinical Social Work Note (Signed)
CSW received consult for possible SNF placement. CSW will follow pending PT recommendation.  Rasheena Talmadge Martha Bhavana Kady, MSW, LCSWA [phone] 

## 2016-11-25 NOTE — H&P (Signed)
[facility] [place] at [facility]   PATIENT NAME: Loretta Wade    MR#:  [id number]  DATE OF BIRTH:  12-20-1950  DATE OF ADMISSION:  11/24/2016  PRIMARY CARE PHYSICIAN: Annita Brod, MD   REQUESTING/REFERRING PHYSICIAN: Roxan Hockey, MD  CHIEF COMPLAINT:   Chief Complaint  Patient presents with  . Respiratory Distress    HISTORY OF PRESENT ILLNESS:  Loretta Wade  is a 66 y.o. female who presents with Respiratory distress. Patient states that she's been having increasing shortness of breath and wheezing has been getting progressively worse over the past couple of weeks. She has significant history of COPD. Workup here initially does not show any pneumonia or exacerbation of her heart failure. Hospitalists were called for admission for COPD exacerbation. Patient did require BiPAP  PAST MEDICAL HISTORY:   Past Medical History:  Diagnosis Date  . Anemia   . CHF (congestive heart failure) (HCC)   . Chronic back pain   . COPD (chronic obstructive pulmonary disease) (HCC)    on 2L home o2  . Edema   . Esophageal reflux   . Fatigue   . Fibromyalgia    Following with pain management  . Hypertension   . Nocturia   . OA (osteoarthritis)   . Sleep apnea    not on CPAP    PAST SURGICAL HISTORY:   Past Surgical History:  Procedure Laterality Date  . ABDOMINAL HYSTERECTOMY    . CHOLECYSTECTOMY    . TOTAL KNEE ARTHROPLASTY     right  . VESICOVAGINAL FISTULA CLOSURE W/ TAH      SOCIAL HISTORY:   Social History  Substance Use Topics  . Smoking status: Former Smoker    Quit date: 11/04/1983  . Smokeless tobacco: Never Used     Comment: quit several years ago - almost 30 years ago  . Alcohol use No    FAMILY HISTORY:   Family History  Problem Relation Age of Onset  . Hypertension Mother   . Diabetes Unknown   . Breast cancer Daughter     DRUG ALLERGIES:   Allergies  Allergen Reactions  . Gabapentin     Pt reports dizziness and  abnormal urine labs.    MEDICATIONS AT HOME:   Prior to Admission medications   Medication Sig Start Date End Date Taking? Authorizing Provider  acetaminophen (TYLENOL) 325 MG tablet Take 2 tablets (650 mg total) by mouth every 6 (six) hours as needed for mild pain (or Fever >/= 101). 11/17/14   Enid Baas, MD  albuterol (PROVENTIL) (2.5 MG/3ML) 0.083% nebulizer solution USE ONE TREATMENT EVERY 6 HOURS AS NEEDED FOR WHEEZING OR SHORTNESS OF BREATH 05/16/16   Ellyn Hack, MD  ANORO ELLIPTA 62.5-25 MCG/INH AEPB Inhale 1 puff into the lungs daily. 10/06/15   [provider]  chlorpheniramine-HYDROcodone (TUSSIONEX) 10-8 MG/5ML SUER Take 5 mLs by mouth every 12 (twelve) hours as needed for cough. 10/11/16   Katharina Caper, MD  cholecalciferol (VITAMIN D) 1000 UNITS tablet Take 1,000 Units by mouth daily.     [provider]  ciprofloxacin-dexamethasone (CIPRODEX) OTIC suspension Place 4 drops into the left ear 2 (two) times daily. 10/11/16   Katharina Caper, MD  citalopram (CELEXA) 20 MG tablet TAKE ONE (1) TABLET BY MOUTH EVERY DAY 05/16/16   Ellyn Hack, MD  colesevelam [facility]) 625 MG tablet TAKE ONE TABLET TWICE A DAY WITH FOOD 05/16/16   Ellyn Hack, MD  cyanocobalamin 500 MCG  tablet Take 500 mcg by mouth daily.    [provider]  dexlansoprazole (DEXILANT) 60 MG capsule TAKE ONE (1) CAPSULE EACH DAY 05/16/16   Ellyn Hack, MD  fluticasone [facility]) 50 MCG/ACT nasal spray USE TWO SPRAYS IN EACH NOSTRIL EVERY DAY 07/06/16   Carlynn Purl, Danna Hefty, MD  furosemide (LASIX) 40 MG tablet Take 1 tablet (40 mg total) by mouth 2 (two) times daily. 02/08/15   Auburn Bilberry, MD  guaiFENesin (MUCINEX) 600 MG 12 hr tablet Take 1 tablet (600 mg total) by mouth 2 (two) times daily. 10/11/16   Katharina Caper, MD  guaiFENesin-dextromethorphan (ROBITUSSIN DM) 100-10 MG/5ML syrup Take 5-10 mLs by mouth every 4 (four) hours as needed for cough.     [provider]   hydrocortisone (ANUSOL-HC) 25 MG suppository Place 1 suppository (25 mg total) rectally 2 (two) times daily. 10/11/16   Katharina Caper, MD  loperamide (IMODIUM) 2 MG capsule Take 2-4 mg by mouth as needed for diarrhea or loose stools.    [provider]  Magnesium 250 MG TABS Take 250 mg by mouth daily.    [provider]  mouth rinse LIQD solution 15 mLs by Mouth Rinse route 2 (two) times daily. 10/11/16   Katharina Caper, MD  nystatin (MYCOSTATIN) 100000 UNIT/ML suspension Take 5 mLs (500,000 Units total) by mouth 4 (four) times daily. 11/10/15   Ellyn Hack, MD  nystatin ointment (MYCOSTATIN) Apply 1 application topically 3 (three) times daily. 02/25/16   Alba Cory, MD  oxybutynin (DITROPAN) 5 MG tablet TAKE ONE (1) TABLET BY MOUTH EVERY DAY 05/16/16   Ellyn Hack, MD  pregabalin (LYRICA) 150 MG capsule Take 1 capsule (150 mg total) by mouth 2 (two) times daily. 05/16/16   Ellyn Hack, MD  propranolol ER (INDERAL LA) 80 MG 24 hr capsule TAKE ONE CAPSULE BY MOUTH DAILY 06/06/16   Ellyn Hack, MD  ranitidine (ZANTAC) 150 MG tablet Take 1 tablet (150 mg total) by mouth 2 (two) times daily. 05/16/16   Ellyn Hack, MD  rOPINIRole (REQUIP) 0.25 MG tablet TAKE THREE TABLETS BY MOUTH EVERY NIGHT AT BEDTIME 11/15/15   Ellyn Hack, MD  rOPINIRole (REQUIP) 0.25 MG tablet Take 3 tablets (0.75 mg total) by mouth at bedtime. 05/16/16   Ellyn Hack, MD  traMADol (ULTRAM) 50 MG tablet Take 1 tablet (50 mg total) by mouth every 6 (six) hours as needed. 11/02/16 11/02/17  Arnaldo Natal, MD    REVIEW OF SYSTEMS:  Review of Systems  Constitutional: Negative for chills, fever, malaise/fatigue and weight loss.  HENT: Negative for ear pain, hearing loss and tinnitus.   Eyes: Negative for blurred vision, double vision, pain and redness.  Respiratory: Positive for cough, shortness of breath and wheezing. Negative for hemoptysis.   Cardiovascular: Negative for  chest pain, palpitations, orthopnea and leg swelling.  Gastrointestinal: Negative for abdominal pain, constipation, diarrhea, nausea and vomiting.  Genitourinary: Negative for dysuria, frequency and hematuria.  Musculoskeletal: Negative for back pain, joint pain and neck pain.  Skin:       No acne, rash, or lesions  Neurological: Negative for dizziness, tremors, focal weakness and weakness.  Endo/Heme/Allergies: Negative for polydipsia. Does not bruise/bleed easily.  Psychiatric/Behavioral: Negative for depression. The patient is not nervous/anxious and does not have insomnia.      VITAL SIGNS:   Vitals:   11/24/16 2209  BP: (!) 142/71  Pulse: 78  Resp: (!) 28  Temp: 98.3 F (36.8 C)  TempSrc: Oral  SpO2: 96%  Weight: (!) 156.5 kg (345 lb)  Height: 5\' 4"  (1.626 m)   Wt Readings from Last 3 Encounters:  11/24/16 (!) 156.5 kg (345 lb)  11/01/16 (!) 160.6 kg (354 lb)  10/03/16 (!) 163.7 kg (361 lb)    PHYSICAL EXAMINATION:  Physical Exam  Vitals reviewed. Constitutional: She is oriented to person, place, and time. She appears well-developed and well-nourished. No distress.  HENT:  Head: Normocephalic and atraumatic.  Mouth/Throat: Oropharynx is clear and moist.  Eyes: Pupils are equal, round, and reactive to light. Conjunctivae and EOM are normal. No scleral icterus.  Neck: Normal range of motion. Neck supple. No JVD present. No thyromegaly present.  Cardiovascular: Normal rate, regular rhythm and intact distal pulses.  Exam reveals no gallop and no friction rub.   No murmur heard. Respiratory: She is in respiratory distress. She has wheezes. She has no rales.  GI: Soft. Bowel sounds are normal. She exhibits no distension. There is no tenderness.  Musculoskeletal: Normal range of motion. She exhibits no edema.  No arthritis, no gout  Lymphadenopathy:    She has no cervical adenopathy.  Neurological: She is alert and oriented to person, place, and time. No cranial nerve  deficit.  No dysarthria, no aphasia  Skin: Skin is warm and dry. No rash noted. No erythema.  Psychiatric: She has a normal mood and affect. Her behavior is normal. Judgment and thought content normal.    LABORATORY PANEL:   CBC  Recent Labs Lab 11/24/16 2209  WBC 7.7  HGB 11.3*  HCT 34.5*  PLT 200   ------------------------------------------------------------------------------------------------------------------  Chemistries   Recent Labs Lab 11/24/16 2209  NA 142  K 4.3  CL 102  CO2 32  GLUCOSE 135*  BUN 13  CREATININE 0.74  CALCIUM 9.0   ------------------------------------------------------------------------------------------------------------------  Cardiac Enzymes  Recent Labs Lab 11/24/16 2209  TROPONINI <0.03   ------------------------------------------------------------------------------------------------------------------  RADIOLOGY:  Dg Chest Port 1 View  Result Date: 11/24/2016 CLINICAL DATA:  Short of breath. EXAM: PORTABLE CHEST 1 VIEW COMPARISON:  Chest radiograph 10/03/2016 FINDINGS: Exam is lordotic. Cardiac silhouette is enlarged. Low lung volumes. No pulmonary edema. Lung bases poorly evaluated. IMPRESSION: Lung bases poorly evaluated. Low lung volumes. No acute findings identified. Electronically Signed   By: Genevive Bi M.D.   On: 11/24/2016 22:44    EKG:   Orders placed or performed during the hospital encounter of 11/24/16  . EKG 12-Lead  . EKG 12-Lead  . ED EKG  . ED EKG    IMPRESSION AND PLAN:  Principal Problem:   COPD exacerbation (HCC) - continue BiPAP, treat with IV Solu-Medrol and antibiotics, when necessary duo nebs Active Problems:   Chronic diastolic CHF (congestive heart failure) (HCC) - stable, not in exacerbation, continue home meds   Fibromyalgia - continue home medications   GERD (gastroesophageal reflux disease) - home dose PPI  All the records are reviewed and case discussed with ED provider. Management  plans discussed with the patient and/or family.  DVT PROPHYLAXIS: SubQ lovenox  GI PROPHYLAXIS: PPI  ADMISSION STATUS: Inpatient  CODE STATUS: Full Code Status History    Date Active Date Inactive Code Status Order ID Comments User Context   10/03/2016 11:43 AM 10/12/2016 12:56 AM Full Code [id number]  Ramonita Lab, MD Inpatient   11/01/2015  9:21 AM 11/01/2015  1:03 PM Full Code [id number]  Arnaldo Natal, MD Inpatient   07/11/2015  4:44 PM 07/14/2015  8:37 PM Full Code [id number]  Enid Baas, MD Inpatient   05/12/2015  6:04 PM 05/16/2015  7:58 PM Full Code [id number]  Shaune Pollack, MD Inpatient   04/12/2015  5:08 PM 04/15/2015  6:04 PM Full Code [id number]  Hower, Cletis Athens, MD ED   01/28/2015  8:08 AM 02/08/2015  2:34 PM Full Code [id number]  Alford Highland, MD ED   11/13/2014  1:02 PM 11/19/2014  4:39 PM Full Code [id number]  Katharina Caper, MD Inpatient   11/04/2014  5:50 PM 11/08/2014  4:37 PM Full Code [id number]  Altamese Dilling, MD Inpatient      TOTAL TIME TAKING CARE OF THIS PATIENT: 45 minutes.   Ritaj Dullea FIELDING 11/25/2016, 12:40 AM  [employer]  [phone]  CC: Primary care physician; Annita Brod, MD  Note:  This document was prepared using Dragon voice recognition software and may include unintentional dictation errors.

## 2016-11-25 NOTE — Progress Notes (Signed)
Chaplain received an OR for prayer. [place] met with pt who states she wanted prayer for comfort and healing. Pt told [place] that her cousin passed away today here at the hospital. Pt did not show signs of shock, but she appeared concerned about the loss. Following pt's request, [place] offered prayers and pastoral presence. [place] will follow up pt as needed.   11/25/16 1900  Clinical Encounter Type  Visited With Patient  Visit Type Initial  Referral From Nurse  Consult/Referral To Chaplain  Spiritual Encounters  Spiritual Needs Prayer

## 2016-11-25 NOTE — Progress Notes (Signed)
Lovenox changed to 40 mg BID for BMI >40 and CrCl >30. 

## 2016-11-25 NOTE — Progress Notes (Signed)
Patient taken off of BiPap and placed on nasal cannula at 3l. Tol well at this time, will continue to monitor.

## 2016-11-25 NOTE — Progress Notes (Signed)
eLink Physician-Brief Progress Note Patient Name: Loretta Wade DOB: 1950/10/06 MRN: [id number]   Date of Service  11/25/2016  HPI/Events of Note  66 yo female with PMH of COPD. Admitted with AECOPD. Now on BiPAP. PCCM asked to assume care in stepdown unit. VSS.   eICU Interventions  No new orders.      Intervention Category Evaluation Type: New Patient Evaluation  Lysle Dingwall 11/25/2016, 1:47 AM

## 2016-11-25 NOTE — Progress Notes (Signed)
Pharmacy Antibiotic Note  Loretta Wade is a 65 y.o. female admitted on 11/24/2016 with pneumonia.  Pharmacy has been consulted for vancomycin and cefepime dosing.  Plan: DW 96kg  Vd 67L kei 0.093 hr-1  T1/2 7 hours Vancomycin 1250 mg q 8 hours ordered. Not candidate for stacked dosing. Level before 5th dose. Goal trough 15-20.  Cefepime 2 grams q 8 hours ordered.  Height: 5\' 4"  (162.6 cm) Weight: (!) 345 lb (156.5 kg) IBW/kg (Calculated) : 54.7  Temp (24hrs), Avg:98.3 F (36.8 C), Min:98.3 F (36.8 C), Max:98.3 F (36.8 C)   Recent Labs Lab 11/24/16 2209 11/24/16 2223  WBC 7.7  --   CREATININE 0.74  --   LATICACIDVEN  --  1.0    Estimated Creatinine Clearance: 107 mL/min (by C-G formula based on SCr of 0.74 mg/dL).    Allergies  Allergen Reactions  . Gabapentin     Pt reports dizziness and abnormal urine labs.    Antimicrobials this admission: Vancomycin, cefepime  >>    >>   Dose adjustments this admission:   Microbiology results: 9/21 BCx: pending 9/21 UCx: pendng 7/31 MRSA PCR: (-)      9/22 CXR: no acute findings  9/21 UA: pending  Thank you for allowing pharmacy to be a part of this patient's care.  Kierstan Auer S 11/25/2016 12:53 AM

## 2016-11-25 NOTE — Care Management Note (Addendum)
Case Management Note  Patient Details  Name: KHAMRYN CALDERONE MRN: [id number] Date of Birth: Sep 13, 1950  Subjective/Objective:    Ms Doughman is currently still open to [facility] for HH=PT. Per Marita [place] at [facility], Ms Christley has called [facility] to cancel her home PT visits for the past 3 weeks per c/o weakness. Marita Kansas will update the [facility] home health team that Ms Biddinger is Inpatient at [facility]. Consult to CM for "Trilogy for Obesity Hypoventilation". Call to [facility] at [facility] for guidance about how to initiate process to qualify for a Trilogy. Current discharge plan is to discharge to a SNF. A Non-Invasive Home Ventilation Referral Form from [facility] is on Ms Milby's chart for MD to review and sign. CM will fax a copy of a current ABG to [place] when it is available.                   Action/Plan:   Expected Discharge Date:                  Expected Discharge Plan:     In-House Referral:     Discharge planning Services     Post Acute Care Choice:    Choice offered to:     DME Arranged:    DME Agency:     HH Arranged:    HH Agency:     Status of Service:     If discussed at [employer] of Stay Meetings, dates discussed:    Additional Comments:  Letonya Mangels A, RN 11/25/2016, 3:58 PM

## 2016-11-25 NOTE — Consult Note (Signed)
PULMONARY / CRITICAL CARE MEDICINE   Name: Loretta Wade MRN: [id number] DOB: Apr 17, 1950    ADMISSION DATE:  11/24/2016   CONSULTATION DATE: 11/25/16  REFERRING MD:  Dr. Jannifer Franklin  REASON: ACUTE RESPIRATORY FAILURE   CHIEF COMPLAINT:  ACUTE RESPIRATORY DISTRESS  HISTORY OF PRESENT ILLNESS:  This is a 66 year old female with a past medical history as indicated below who was recently discharged from the hospital after hospitalization for acute COPD exacerbation who presents today with complaints of worsening respiratory distress and a productive cough. The shortness of breath has been going on since discharge and just got worse over the last couple of days. The cough started yesterday. Patient is somnolent and unable to characterize the cough. She denies fever and chills.  At the ED, she was placed on BiPAP. How workup was unremarkable, hence she was admitted to stepdown status for COPD exacerbation. She reports mild improvement in respiratory distress with BiPAP. Patient uses home oxygen- 2 L nasal cannula  PAST MEDICAL HISTORY :  She  has a past medical history of Anemia; CHF (congestive heart failure) ([place]); Chronic back pain; COPD (chronic obstructive pulmonary disease) ([place]); Edema; Esophageal reflux; Fatigue; Fibromyalgia; Hypertension; Nocturia; OA (osteoarthritis); and Sleep apnea.  PAST SURGICAL HISTORY: She  has a past surgical history that includes Cholecystectomy; Total knee arthroplasty; Vesicovaginal fistula closure w/ TAH; and Abdominal hysterectomy.  Allergies  Allergen Reactions  . Gabapentin     Pt reports dizziness and abnormal urine labs.    No current facility-administered medications on file prior to encounter.    Current Outpatient Prescriptions on File Prior to Encounter  Medication Sig  . acetaminophen (TYLENOL) 325 MG tablet Take 2 tablets (650 mg total) by mouth every 6 (six) hours as needed for mild pain (or Fever >/= 101).  Marland Kitchen albuterol (PROVENTIL) (2.5  MG/3ML) 0.083% nebulizer solution USE ONE TREATMENT EVERY 6 HOURS AS NEEDED FOR WHEEZING OR SHORTNESS OF BREATH  . ANORO ELLIPTA 62.5-25 MCG/INH AEPB Inhale 1 puff into the lungs daily.  . chlorpheniramine-HYDROcodone (TUSSIONEX) 10-8 MG/5ML SUER Take 5 mLs by mouth every 12 (twelve) hours as needed for cough.  . cholecalciferol (VITAMIN D) 1000 UNITS tablet Take 1,000 Units by mouth daily.   . ciprofloxacin-dexamethasone (CIPRODEX) OTIC suspension Place 4 drops into the left ear 2 (two) times daily.  . citalopram (CELEXA) 20 MG tablet TAKE ONE (1) TABLET BY MOUTH EVERY DAY  . colesevelam (WELCHOL) 625 MG tablet TAKE ONE TABLET TWICE A DAY WITH FOOD  . cyanocobalamin 500 MCG tablet Take 500 mcg by mouth daily.  Marland Kitchen dexlansoprazole (DEXILANT) 60 MG capsule TAKE ONE (1) CAPSULE EACH DAY  . fluticasone (FLONASE) 50 MCG/ACT nasal spray USE TWO SPRAYS IN EACH NOSTRIL EVERY DAY  . furosemide (LASIX) 40 MG tablet Take 1 tablet (40 mg total) by mouth 2 (two) times daily.  Marland Kitchen guaiFENesin (MUCINEX) 600 MG 12 hr tablet Take 1 tablet (600 mg total) by mouth 2 (two) times daily.  Marland Kitchen guaiFENesin-dextromethorphan (ROBITUSSIN DM) 100-10 MG/5ML syrup Take 5-10 mLs by mouth every 4 (four) hours as needed for cough.   . hydrocortisone (ANUSOL-HC) 25 MG suppository Place 1 suppository (25 mg total) rectally 2 (two) times daily.  Marland Kitchen loperamide (IMODIUM) 2 MG capsule Take 2-4 mg by mouth as needed for diarrhea or loose stools.  . Magnesium 250 MG TABS Take 250 mg by mouth daily.  Marland Kitchen mouth rinse LIQD solution 15 mLs by Mouth Rinse route 2 (two) times daily.  Marland Kitchen nystatin (MYCOSTATIN)  100000 UNIT/ML suspension Take 5 mLs (500,000 Units total) by mouth 4 (four) times daily.  Marland Kitchen nystatin ointment (MYCOSTATIN) Apply 1 application topically 3 (three) times daily.  Marland Kitchen oxybutynin (DITROPAN) 5 MG tablet TAKE ONE (1) TABLET BY MOUTH EVERY DAY  . pregabalin (LYRICA) 150 MG capsule Take 1 capsule (150 mg total) by mouth 2 (two) times  daily.  . propranolol ER (INDERAL LA) 80 MG 24 hr capsule TAKE ONE CAPSULE BY MOUTH DAILY  . ranitidine (ZANTAC) 150 MG tablet Take 1 tablet (150 mg total) by mouth 2 (two) times daily.  Marland Kitchen rOPINIRole (REQUIP) 0.25 MG tablet TAKE THREE TABLETS BY MOUTH EVERY NIGHT AT BEDTIME  . rOPINIRole (REQUIP) 0.25 MG tablet Take 3 tablets (0.75 mg total) by mouth at bedtime.  . traMADol (ULTRAM) 50 MG tablet Take 1 tablet (50 mg total) by mouth every 6 (six) hours as needed.    FAMILY HISTORY:  Her indicated that her mother is alive. She indicated that the status of her daughter is unknown. She indicated that the status of her unknown relative is unknown.    SOCIAL HISTORY: She  reports that she quit smoking about 33 years ago. She has never used smokeless tobacco. She reports that she does not drink alcohol or use drugs.  REVIEW OF SYSTEMS:   Unable to obtain as patient is on BiPAP and very somnolent  SUBJECTIVE:   VITAL SIGNS: BP (!) 146/75   Pulse 62   Temp 98 F (36.7 C) (Axillary)   Resp 17   Ht 5\' 4"  (1.626 m)   Wt (!) 387 lb 5.6 oz (175.7 kg)   SpO2 94%   BMI 66.49 kg/m   HEMODYNAMICS:    VENTILATOR SETTINGS: FiO2 (%):  [40 %] 40 %  INTAKE / OUTPUT: No intake/output data recorded.  PHYSICAL EXAMINATION: General:  Moderate respiratory distress Neuro:  Awakens to voice and touch, follows basic commands, somnolent HEENT: PERRLA, oral mucosa dry Cardiovascular:  Apical pulse regular, S1, S2, no murmur, regurg or gallop, +2 edema, +2 pulses bilaterally Lungs:  Increased work of breathing, Diminished breath sounds in all lung fields,xpiratory wheezes Abdomen:  Obese, normal bowel sounds Musculoskeletal:  No deformities, positive range of motion Skin:  Warm and dry  LABS:  BMET  Recent Labs Lab 11/24/16 2209  NA 142  K 4.3  CL 102  CO2 32  BUN 13  CREATININE 0.74  GLUCOSE 135*    Electrolytes  Recent Labs Lab 11/24/16 2209  CALCIUM 9.0    CBC  Recent  Labs Lab 11/24/16 2209  WBC 7.7  HGB 11.3*  HCT 34.5*  PLT 200    Coag's No results for input(s): APTT, INR in the last 168 hours.  Sepsis Markers  Recent Labs Lab 11/24/16 2223  LATICACIDVEN 1.0    ABG No results for input(s): PHART, PCO2ART, PO2ART in the last 168 hours.  Liver Enzymes No results for input(s): AST, ALT, ALKPHOS, BILITOT, ALBUMIN in the last 168 hours.  Cardiac Enzymes  Recent Labs Lab 11/24/16 2209  TROPONINI <0.03    Glucose  Recent Labs Lab 11/25/16 0138  GLUCAP 183*    Imaging Dg Chest Port 1 View  Result Date: 11/24/2016 CLINICAL DATA:  Short of breath. EXAM: PORTABLE CHEST 1 VIEW COMPARISON:  Chest radiograph 10/03/2016 FINDINGS: Exam is lordotic. Cardiac silhouette is enlarged. Low lung volumes. No pulmonary edema. Lung bases poorly evaluated. IMPRESSION: Lung bases poorly evaluated. Low lung volumes. No acute findings identified. Electronically Signed  By: Suzy Bouchard M.D.   On: 11/24/2016 22:44   CULTURES: Blood cultures 2. Urine cultures  ANTIBIOTICS: Cefepime Vancomycin  SIGNIFICANT EVENTS: 11/24/2016: Admitted  LINES/TUBES: Peripheral IVs  DISCUSSION: 66 year old female with multiple comorbidities presenting with acute respiratory failure, acute COPD exacerbation,and questionable HCAP  ASSESSMENT Acute hypoxemic respiratory failure. Acute COPD exacerbation Questionable hospital-acquired pneumonia History of CHF History of chronic pain Plan Continuous BiPAP and titrate to nasal cannula as tolerated IV steroids Follow-up cultures Trend pro-calcitonin and adjust antibiotics Monitor fever curve and CBC Nothing by mouth while on BiPAP. Resume home medications once of BiPAP Social service consult for placement. GI and DVT prophylaxis   FAMILY  - Updates: no family at bedside. We'll updated when available - Inter-disciplinary family meet or Palliative Care meeting due by: day [place]. [facility]  [facility] Pul[facility] Pager [phone] or [phone] 11/25/2016, 5:41 AM   STAFF NOTE: I. Dr. Ashby Dawes, have personally reviewed the patient's available data including medical history , events of notes, physican examination and test results as part of my evaluation. I have discussed with the  Care with the NP and other care providers including  pharmacist, ICU RN, RRT, dietary.  Physical Exam Lungs - Decreased air entry bilaterally. Pt minimally responsive.   Pt is a 66 yo morbidly obese female, discharged on 8/30 from ED. She was not accepted back at [facility], she was offered bed at [facility] but refused, instead went home. Returned to ED yesterday eve with resp distress and placed on bipap for AECOPD.  Currently the patient remains on bipap.  I personally reviewed films which showed reduced lung volumes from obesity. Review of ABG shows 7.33/68/70/35.9; c/w chronic hypercapnic resp failure. Procalcitonin negative, doubt pneumonia. Continue steroids for AECOPD. Wean down bipap as tolerated.      Marda Stalker, MD.   Board Certified in Internal Medicine, Pulmonary Medicine, [place], and Sleep Medicine.  [facility] Office Number: [phone] Pager: [phone]  Patricia Pesa, M.D.  Merton Border, M.D  11/25/2016

## 2016-11-25 NOTE — Progress Notes (Signed)
[facility] at [place] NAME: Loretta Wade    MR#:  [id number]  DATE OF BIRTH:  1950/04/30  SUBJECTIVE:  CHIEF COMPLAINT:   Chief Complaint  Patient presents with  . Respiratory Distress    Came with respi distress, requiring bipap. When seen around 12- came off bipap, on nasal canula. Denies cough or sputum.  REVIEW OF SYSTEMS:  CONSTITUTIONAL: No fever, fatigue or weakness.  EYES: No blurred or double vision.  EARS, NOSE, AND THROAT: No tinnitus or ear pain.  RESPIRATORY: No cough,positive for shortness of breath, wheezing, no hemoptysis.  CARDIOVASCULAR: No chest pain, orthopnea, edema.  GASTROINTESTINAL: No nausea, vomiting, diarrhea or abdominal pain.  GENITOURINARY: No dysuria, hematuria.  ENDOCRINE: No polyuria, nocturia,  HEMATOLOGY: No anemia, easy bruising or bleeding SKIN: No rash or lesion. MUSCULOSKELETAL: No joint pain or arthritis.   NEUROLOGIC: No tingling, numbness, weakness.  PSYCHIATRY: No anxiety or depression.   ROS  DRUG ALLERGIES:   Allergies  Allergen Reactions  . Gabapentin     Pt reports dizziness and abnormal urine labs.    VITALS:  Blood pressure (!) 113/49, pulse (!) 57, temperature 98.6 F (37 C), temperature source Oral, resp. rate 18, height 5\' 4"  (1.626 m), weight (!) 175.7 kg (387 lb 5.6 oz), SpO2 98 %.  PHYSICAL EXAMINATION:  GENERAL:  66 y.o.-year-old morbidly obese patient lying in the bed with no acute distress.  EYES: Pupils equal, round, reactive to light and accommodation. No scleral icterus. Extraocular muscles intact.  HEENT: Head atraumatic, normocephalic. Oropharynx and nasopharynx clear.  NECK:  Supple, no jugular venous distention. No thyroid enlargement, no tenderness.  LUNGS: Normal breath sounds bilaterally, no wheezing, some crepitation. No use of accessory muscles of respiration. On nasal canula oxygen now. CARDIOVASCULAR: S1, S2 normal. No murmurs, rubs, or gallops.   ABDOMEN: Soft, nontender, nondistended. Bowel sounds present. No organomegaly or mass.  EXTREMITIES: No pedal edema, cyanosis, or clubbing.  NEUROLOGIC: Cranial nerves II through XII are intact. Muscle strength 5/5 in all extremities. Sensation intact. Gait not checked.  PSYCHIATRIC: The patient is alert and oriented x 3.  SKIN: No obvious rash, lesion, or ulcer.   Physical Exam LABORATORY PANEL:   CBC  Recent Labs Lab 11/25/16 0423  WBC 6.6  HGB 11.4*  HCT 35.0  PLT 195   ------------------------------------------------------------------------------------------------------------------  Chemistries   Recent Labs Lab 11/25/16 0423  NA 141  K 4.3  CL 102  CO2 32  GLUCOSE 170*  BUN 14  CREATININE 0.64  CALCIUM 9.2  MG 2.2   ------------------------------------------------------------------------------------------------------------------  Cardiac Enzymes  Recent Labs Lab 11/24/16 2209  TROPONINI <0.03   ------------------------------------------------------------------------------------------------------------------  RADIOLOGY:  Dg Chest Port 1 View  Result Date: 11/24/2016 CLINICAL DATA:  Short of breath. EXAM: PORTABLE CHEST 1 VIEW COMPARISON:  Chest radiograph 10/03/2016 FINDINGS: Exam is lordotic. Cardiac silhouette is enlarged. Low lung volumes. No pulmonary edema. Lung bases poorly evaluated. IMPRESSION: Lung bases poorly evaluated. Low lung volumes. No acute findings identified. Electronically Signed   By: Suzy Bouchard M.D.   On: 11/24/2016 22:44    ASSESSMENT AND PLAN:   Principal Problem:   COPD exacerbation ([place]) Active Problems:   Chronic diastolic CHF (congestive heart failure) (HCC)   Fibromyalgia   GERD (gastroesophageal reflux disease)  * COPD exacerbation (HCC) - Obesity hypoventilation may playing a role.  continue BiPAP, Now improved on nasal canula May need Bipap or trilogy on discharge. treat with IV Solu-Medrol and antibiotics,  duo nebs  *  Chronic diastolic CHF (congestive heart failure) (HCC) - stable, not in exacerbation, continue home meds  * Fibromyalgia - continue home medications  * GERD (gastroesophageal reflux disease) - home dose PPI   All the records are reviewed and case discussed with Care Management/Social Workerr. Management plans discussed with the patient, family and they are in agreement.  CODE STATUS: full.  TOTAL TIME TAKING CARE OF THIS PATIENT: 35 minutes.    POSSIBLE D/C IN 1-2 DAYS, DEPENDING ON CLINICAL CONDITION.   Vaughan Basta M.D on 11/25/2016   Between 7am to 6pm - Pager - [phone]  After 6pm go to [email or web address] - password [email or web address] [facility]  Office  [phone]  CC: Primary care physician; Clovia Cuff, MD  Note: This dictation was prepared with Dragon dictation along with smaller phrase technology. Any transcriptional errors that result from this process are unintentional.

## 2016-11-26 LAB — BASIC METABOLIC PANEL
ANION GAP: 7 (ref 5–15)
BUN: 19 mg/dL (ref 6–20)
CALCIUM: 9.2 mg/dL (ref 8.9–10.3)
CO2: 33 mmol/L — AB (ref 22–32)
Chloride: 101 mmol/L (ref 101–111)
Creatinine, Ser: 0.76 mg/dL (ref 0.44–1.00)
GFR calc Af Amer: >60 mL/min (ref >60)
GFR calc non Af Amer: >60 mL/min (ref >60)
GLUCOSE: 175 mg/dL — AB (ref 65–99)
POTASSIUM: 4.3 mmol/L (ref 3.5–5.1)
Sodium: 141 mmol/L (ref 135–145)

## 2016-11-26 LAB — CBC
HEMATOCRIT: 33.6 % — AB (ref 35.0–47.0)
Hemoglobin: 10.9 g/dL — ABNORMAL LOW (ref 12.0–16.0)
MCH: 30.8 pg (ref 26.0–34.0)
MCHC: 32.5 g/dL (ref 32.0–36.0)
MCV: 94.6 fL (ref 80.0–100.0)
Platelets: 202 10*3/uL (ref 150–440)
RBC: 3.55 MIL/uL — ABNORMAL LOW (ref 3.80–5.20)
RDW: 15 % — AB (ref 11.5–14.5)
WBC: 11.9 10*3/uL — AB (ref 3.6–11.0)

## 2016-11-26 LAB — URINE CULTURE

## 2016-11-26 LAB — PROCALCITONIN: Procalcitonin: <0.1 ng/mL

## 2016-11-26 LAB — MAGNESIUM: Magnesium: 2.1 mg/dL (ref 1.7–2.4)

## 2016-11-26 MED ORDER — POLYETHYLENE GLYCOL 3350 17 G PO PACK
17.0000 g | PACK | Freq: Every day | ORAL | Status: DC
  Administered 2016-11-26: 17 g via ORAL
  Filled 2016-11-26 (×2): qty 1

## 2016-11-26 MED ORDER — DOCUSATE SODIUM 100 MG PO CAPS
100.0000 mg | ORAL_CAPSULE | Freq: Two times a day (BID) | ORAL | Status: DC
  Administered 2016-11-26 – 2016-11-27 (×2): 100 mg via ORAL
  Filled 2016-11-26 (×5): qty 1

## 2016-11-26 MED ORDER — IPRATROPIUM-ALBUTEROL 0.5-2.5 (3) MG/3ML IN SOLN
3.0000 mL | RESPIRATORY_TRACT | Status: DC
  Administered 2016-11-26: 08:00:00 3 mL via RESPIRATORY_TRACT
  Filled 2016-11-26: qty 3

## 2016-11-26 MED ORDER — IPRATROPIUM-ALBUTEROL 0.5-2.5 (3) MG/3ML IN SOLN
3.0000 mL | Freq: Four times a day (QID) | RESPIRATORY_TRACT | Status: DC
  Administered 2016-11-26 – 2016-11-28 (×8): 3 mL via RESPIRATORY_TRACT
  Filled 2016-11-26 (×9): qty 3

## 2016-11-26 NOTE — Progress Notes (Signed)
Pt lying in bed AAOx4 pt was on 3lnc throughout shift placed on bipap at HS tolerated well. Pt has c/o pain generalized this shift and was given pain meds as ordered tolerated well. Report given to [facility] on receiving floor

## 2016-11-26 NOTE — Progress Notes (Signed)
[facility] at [place] NAME: Parrish Daddario    MR#:  [id number]  DATE OF BIRTH:  [birth year]  SUBJECTIVE:  CHIEF COMPLAINT:   Chief Complaint  Patient presents with  . Respiratory Distress    Came with respi distress, requiring bipap.  came off bipap, on nasal canula. Denies cough or sputum. Transferred to medical floor, stable now.  REVIEW OF SYSTEMS:  CONSTITUTIONAL: No fever, fatigue or weakness.  EYES: No blurred or double vision.  EARS, NOSE, AND THROAT: No tinnitus or ear pain.  RESPIRATORY: No cough,positive for shortness of breath, wheezing, no hemoptysis.  CARDIOVASCULAR: No chest pain, orthopnea, edema.  GASTROINTESTINAL: No nausea, vomiting, diarrhea or abdominal pain.  GENITOURINARY: No dysuria, hematuria.  ENDOCRINE: No polyuria, nocturia,  HEMATOLOGY: No anemia, easy bruising or bleeding SKIN: No rash or lesion. MUSCULOSKELETAL: No joint pain or arthritis.   NEUROLOGIC: No tingling, numbness, weakness.  PSYCHIATRY: No anxiety or depression.   ROS  DRUG ALLERGIES:   Allergies  Allergen Reactions  . Gabapentin     Pt reports dizziness and abnormal urine labs.    VITALS:  Blood pressure (!) 117/50, pulse (!) 53, temperature 97.9 F (36.6 C), temperature source Oral, resp. rate 20, height 5\' 4"  (1.626 m), weight (!) 177.5 kg (391 lb 6.4 oz), SpO2 97 %.  PHYSICAL EXAMINATION:  GENERAL:  66 y.o.-year-old morbidly obese patient lying in the bed with no acute distress.  EYES: Pupils equal, round, reactive to light and accommodation. No scleral icterus. Extraocular muscles intact.  HEENT: Head atraumatic, normocephalic. Oropharynx and nasopharynx clear.  NECK:  Supple, no jugular venous distention. No thyroid enlargement, no tenderness.  LUNGS: Normal breath sounds bilaterally, no wheezing, some crepitation. No use of accessory muscles of respiration. On nasal canula oxygen now. CARDIOVASCULAR: S1, S2 normal. No murmurs,  rubs, or gallops.  ABDOMEN: Soft, nontender, nondistended. Bowel sounds present. No organomegaly or mass.  EXTREMITIES: No pedal edema, cyanosis, or clubbing.  NEUROLOGIC: Cranial nerves II through XII are intact. Muscle strength 5/5 in all extremities. Sensation intact. Gait not checked.  PSYCHIATRIC: The patient is alert and oriented x 3.  SKIN: No obvious rash, lesion, or ulcer.   Physical Exam LABORATORY PANEL:   CBC  Recent Labs Lab 11/26/16 0440  WBC 11.9*  HGB 10.9*  HCT 33.6*  PLT 202   ------------------------------------------------------------------------------------------------------------------  Chemistries   Recent Labs Lab 11/26/16 0440  NA 141  K 4.3  CL 101  CO2 33*  GLUCOSE 175*  BUN 19  CREATININE 0.76  CALCIUM 9.2  MG 2.1   ------------------------------------------------------------------------------------------------------------------  Cardiac Enzymes  Recent Labs Lab 11/24/16 2209  TROPONINI <0.03   ------------------------------------------------------------------------------------------------------------------  RADIOLOGY:  Dg Chest Port 1 View  Result Date: 11/24/2016 CLINICAL DATA:  Short of breath. EXAM: PORTABLE CHEST 1 VIEW COMPARISON:  Chest radiograph 10/03/2016 FINDINGS: Exam is lordotic. Cardiac silhouette is enlarged. Low lung volumes. No pulmonary edema. Lung bases poorly evaluated. IMPRESSION: Lung bases poorly evaluated. Low lung volumes. No acute findings identified. Electronically Signed   By: Suzy Bouchard M.D.   On: 11/24/2016 22:44    ASSESSMENT AND PLAN:   Principal Problem:   COPD exacerbation ([place]) Active Problems:   Chronic diastolic CHF (congestive heart failure) (HCC)   Fibromyalgia   GERD (gastroesophageal reflux disease)  * COPD exacerbation (HCC) - Obesity hypoventilation may playing a role.  continue BiPAP, Now improved on nasal canula May need Bipap or trilogy on discharge. treat with IV  Solu-Medrol and antibiotics,  duo nebs Suspected infection and pneumonia   *  Chronic diastolic CHF (congestive heart failure) (HCC) - stable, not in exacerbation, continue home meds  * Fibromyalgia - continue home medications  * GERD (gastroesophageal reflux disease) - home dose PPI * bradycardia, HR in 50's- Hold inderal.  All the records are reviewed and case discussed with Care Management/Social Workerr. Management plans discussed with the patient, family and they are in agreement.  CODE STATUS: full.  TOTAL TIME TAKING CARE OF THIS PATIENT: 35 minutes.    POSSIBLE D/C IN 1-2 DAYS, DEPENDING ON CLINICAL CONDITION. May need trilogy, ordered by Pulm. Also may need rehab, called PT for placement.  Vaughan Basta M.D on 11/26/2016   Between 7am to 6pm - Pager - [phone]  After 6pm go to [email or web address] - password [email or web address] [facility]  Office  [phone]  CC: Primary care physician; Clovia Cuff, MD  Note: This dictation was prepared with Dragon dictation along with smaller phrase technology. Any transcriptional errors that result from this process are unintentional.

## 2016-11-27 ENCOUNTER — Encounter: Payer: Self-pay | Admitting: Student

## 2016-11-27 LAB — TROPONIN I: Troponin I: <0.03 ng/mL (ref <0.03)

## 2016-11-27 LAB — BASIC METABOLIC PANEL
Anion gap: 8 (ref 5–15)
BUN: 19 mg/dL (ref 6–20)
CHLORIDE: 97 mmol/L — AB (ref 101–111)
CO2: 35 mmol/L — AB (ref 22–32)
CREATININE: 0.73 mg/dL (ref 0.44–1.00)
Calcium: 9.2 mg/dL (ref 8.9–10.3)
GFR calc Af Amer: >60 mL/min (ref >60)
GFR calc non Af Amer: >60 mL/min (ref >60)
Glucose, Bld: 112 mg/dL — ABNORMAL HIGH (ref 65–99)
Potassium: 4.2 mmol/L (ref 3.5–5.1)
SODIUM: 140 mmol/L (ref 135–145)

## 2016-11-27 LAB — CBC
HCT: 35.1 % (ref 35.0–47.0)
Hemoglobin: 11.5 g/dL — ABNORMAL LOW (ref 12.0–16.0)
MCH: 30.8 pg (ref 26.0–34.0)
MCHC: 32.7 g/dL (ref 32.0–36.0)
MCV: 94 fL (ref 80.0–100.0)
PLATELETS: 206 10*3/uL (ref 150–440)
RBC: 3.73 MIL/uL — ABNORMAL LOW (ref 3.80–5.20)
RDW: 15.3 % — AB (ref 11.5–14.5)
WBC: 10.3 10*3/uL (ref 3.6–11.0)

## 2016-11-27 LAB — PROCALCITONIN

## 2016-11-27 MED ORDER — FUROSEMIDE 10 MG/ML IJ SOLN
60.0000 mg | Freq: Once | INTRAMUSCULAR | Status: AC
  Administered 2016-11-27: 10:00:00 60 mg via INTRAVENOUS
  Filled 2016-11-27: qty 8

## 2016-11-27 NOTE — Evaluation (Signed)
Physical Therapy Evaluation Patient Details Name: Loretta Wade MRN: [id number] DOB: [birth year] Today's Date: 11/27/2016   History of Present Illness  Pt is a 66 y/o F who presented with SOB and wheezing.  Pt was admitted with COPD exacerbation.  Pt's PMH includes COPD on 2L home O2, CHF, chronic back pain, fibromyalgia, R TKA.      Clinical Impression  Pt admitted with above diagnosis. Pt currently with functional limitations due to the deficits listed below (see PT Problem List). Ms. Geerdes presents with increased WOB and SOB with any activity.  She lives at home alone.  She currently requires mod +2 assist for supine<>sit and was unable to attempt sit>stand this session due to fatigue, weakness, and SOB. SpO2 in mid 90s at rest and down as low as 92% with activity.  Pt on 3L O2 via [place] throughout entire session.  Pt will benefit from skilled PT to increase their independence and safety with mobility to allow discharge to the venue listed below.      Follow Up Recommendations SNF    Equipment Recommendations  Other (comment) (TBD at next venue of care)    Recommendations for Other Services       Precautions / Restrictions Precautions Precautions: Fall;Other (comment) Precaution Comments: On 2L O2 Restrictions Weight Bearing Restrictions: No      Mobility  Bed Mobility Overal bed mobility: Needs Assistance Bed Mobility: Supine to Sit;Sit to Supine     Supine to sit: Mod assist;+2 for physical assistance;HOB elevated Sit to supine: Mod assist;+2 for physical assistance   General bed mobility comments: Cues for sequencing and assist to elevate trunk and to advance LEs to EOB.  Pt relies heavily on bed rail.  Increased WOB and SOB with supine>sit with cues for pursed lip breathing.    Transfers                 General transfer comment: Unable to assess due to weakness, fatigue, and increased WOB  Ambulation/Gait                Stairs             Wheelchair Mobility    Modified Rankin (Stroke Patients Only)       Balance Overall balance assessment: Needs assistance Sitting-balance support: Bilateral upper extremity supported;Feet supported Sitting balance-[name] Scale: Poor Sitting balance - Comments: Pt relies on having at least 1UE supported while sitting EOB with intermittent min>mod assist to prevent posterior LOB.  Postural control: Posterior lean                                   Pertinent Vitals/Pain Pain Assessment: 0-10 Pain Score: 8  Pain Location: "all over" Pain Descriptors / Indicators: Sharp Pain Intervention(s): Limited activity within patient's tolerance;Monitored during session    Home Living Family/patient expects to be discharged to:: Private residence (Independent living) Living Arrangements: Alone Available Help at Discharge: Other (Comment) (pt reports no family/friends available for assist) Type of Home: Apartment Home Access: Level entry     Home Layout: One level Home Equipment: Wheelchair - manual;Hospital bed;Grab bars - tub/shower (Pt's [phone] is broken )      Prior Function Level of Independence: Needs assistance   Gait / Transfers Assistance Needed: Pt denies any recent falls.  Pt was ambulating up to ~10 ft limited by SOB. Pt uses walker to ambulate in her house but using WC  at times due to SOB.  Pt able to self propel WC with BUEs and BLEs.    ADL's / Homemaking Assistance Needed: Pt independent with dressing but it has been difficult. Pt has assist from family for sponge bathing.   Family assists with cooking,otherwise pt eating microwave dinners.  Family does the driving to medical appointments.          Hand Dominance   Dominant Hand: Right    Extremity/Trunk Assessment   Upper Extremity Assessment Upper Extremity Assessment: LUE deficits/detail;RUE deficits/detail RUE Deficits / Details: RUE strength grossly 3/5 LUE Deficits / Details: L upper arm pain that  began 3 days prior, no MOI, no fall.  Likely strained L bicep musculature with pain and limited strength and ROM with L shoulder flexion and elbow flexion.      Lower Extremity Assessment Lower Extremity Assessment:  (BLE strength grossly 3-/5)       Communication   Communication: No difficulties  Cognition Arousal/Alertness: Awake/alert Behavior During Therapy: WFL for tasks assessed/performed Overall Cognitive Status: Within Functional Limits for tasks assessed                                        General Comments General comments (skin integrity, edema, etc.): SpO2 in mid 90s at rest and down as low as 92% with activity.  Pt on 3L O2 via [place] throughout entire session.     Exercises General Exercises - Lower Extremity Ankle Circles/Pumps: AROM;Both;10 reps;Supine Quad Sets: Strengthening;Both;10 reps;Supine Straight Leg Raises: Strengthening;Both;10 reps;Supine;AAROM   Assessment/Plan    PT Assessment Patient needs continued PT services  PT Problem List Decreased strength;Decreased range of motion;Decreased activity tolerance;Decreased balance;Decreased mobility;Decreased knowledge of use of DME;Decreased safety awareness;Cardiopulmonary status limiting activity;Pain;Obesity       PT Treatment Interventions DME instruction;Gait training;Functional mobility training;Therapeutic activities;Therapeutic exercise;Balance training;Neuromuscular re-education;Patient/family education;Wheelchair mobility training;Modalities    PT Goals (Current goals can be found in the Care Plan section)  Acute Rehab PT Goals Patient Stated Goal: to get stronger PT Goal Formulation: With patient Time For Goal Achievement: 12/11/16 Potential to Achieve Goals: Fair    Frequency Min 2X/week   Barriers to discharge Decreased caregiver support Lives alone    Co-evaluation               AM-PAC PT "6 Clicks" Daily Activity  Outcome Measure Difficulty turning over in bed  (including adjusting bedclothes, sheets and blankets)?: Unable Difficulty moving from lying on back to sitting on the side of the bed? : Unable Difficulty sitting down on and standing up from a chair with arms (e.g., wheelchair, bedside commode, etc,.)?: Unable Help needed moving to and from a bed to chair (including a wheelchair)?: A Lot Help needed walking in hospital room?: Total Help needed climbing 3-5 steps with a railing? : Total 6 Click Score: 7    End of Session Equipment Utilized During Treatment: Oxygen Activity Tolerance: Patient limited by fatigue;Other (comment) (SOB and increased WOB) Patient left: in bed;with call bell/phone within reach;with bed alarm set;Other (comment) (in upright position for improved pulmonary function) Nurse Communication: Mobility status;Other (comment) (SpO2) PT Visit Diagnosis: Muscle weakness (generalized) (M62.81);Difficulty in walking, not elsewhere classified (R26.2);Unsteadiness on feet (R26.81)    Time: 7001-7494 PT Time Calculation (min) (ACUTE ONLY): 39 min   Charges:   PT Evaluation $PT Eval Moderate Complexity: 1 Mod PT Treatments $Therapeutic Activity: 23-37 mins  PT G Codes:   PT G-Codes **NOT FOR INPATIENT CLASS** Functional Assessment Tool Used: AM-PAC 6 Clicks Basic Mobility;Clinical judgement Functional Limitation: Mobility: Walking and moving around Mobility: Walking and Moving Around Current Status (M2111): At least 80 percent but less than 100 percent impaired, limited or restricted Mobility: Walking and Moving Around Goal Status [phone]): At least 40 percent but less than 60 percent impaired, limited or restricted    Collie Siad PT, DPT 11/27/2016, 12:39 PM

## 2016-11-27 NOTE — Plan of Care (Signed)
Problem: Education: Goal: Knowledge of [facility] Education information/materials will improve Outcome: Progressing VSS, free of falls during shift.  Reports generalized 8/10 pain, cough, received PRN PO Norco 5-325mg  x1, Tussionex 10-8mg  x1.  No other complaints overnight.  Concerned re: d/c'ed Anoro inhaler, [facility] MD paged, no new orders/response.  Concerned re: L arm pain, LUQ abd pain, pt states MD aware, but concerned that no imaging done.  Will pass on concerns to day RN.  This RN encouraged pt to discuss with MD in AM.  Bed in low position, call bell within reach.  WCTM.

## 2016-11-27 NOTE — Progress Notes (Signed)
Pt refused bipap

## 2016-11-27 NOTE — Progress Notes (Signed)
[facility] at [place] NAME: Loretta Wade    MR#:  [id number]  DATE OF BIRTH:  Mar 07, 1950  SUBJECTIVE:  CHIEF COMPLAINT:   Chief Complaint  Patient presents with  . Respiratory Distress    Came with respi distress, requiring bipap.  came off bipap, on nasal canula. Denies cough or sputum. Transferred to medical floor, stable now.   C/o some chest tightness on left side today.  REVIEW OF SYSTEMS:  CONSTITUTIONAL: No fever, fatigue or weakness.  EYES: No blurred or double vision.  EARS, NOSE, AND THROAT: No tinnitus or ear pain.  RESPIRATORY: No cough,positive for shortness of breath, wheezing, no hemoptysis.  CARDIOVASCULAR: No chest pain, orthopnea, edema.  GASTROINTESTINAL: No nausea, vomiting, diarrhea or abdominal pain.  GENITOURINARY: No dysuria, hematuria.  ENDOCRINE: No polyuria, nocturia,  HEMATOLOGY: No anemia, easy bruising or bleeding SKIN: No rash or lesion. MUSCULOSKELETAL: No joint pain or arthritis.   NEUROLOGIC: No tingling, numbness, weakness.  PSYCHIATRY: No anxiety or depression.   ROS  DRUG ALLERGIES:   Allergies  Allergen Reactions  . Gabapentin     Pt reports dizziness and abnormal urine labs.    VITALS:  Blood pressure (!) 157/63, pulse 66, temperature 98.1 F (36.7 C), temperature source Oral, resp. rate 20, height 5\' 4"  (1.626 m), weight (!) 177.5 kg (391 lb 6.4 oz), SpO2 97 %.  PHYSICAL EXAMINATION:  GENERAL:  66 y.o.-year-old morbidly obese patient lying in the bed with no acute distress.  EYES: Pupils equal, round, reactive to light and accommodation. No scleral icterus. Extraocular muscles intact.  HEENT: Head atraumatic, normocephalic. Oropharynx and nasopharynx clear.  NECK:  Supple, no jugular venous distention. No thyroid enlargement, no tenderness.  LUNGS: Normal breath sounds bilaterally, no wheezing, some crepitation. No use of accessory muscles of respiration. On nasal canula oxygen  now. CARDIOVASCULAR: S1, S2 normal. No murmurs, rubs, or gallops.  ABDOMEN: Soft, nontender, nondistended. Bowel sounds present. No organomegaly or mass.  EXTREMITIES: No pedal edema, cyanosis, or clubbing.  NEUROLOGIC: Cranial nerves II through XII are intact. Muscle strength 5/5 in all extremities. Sensation intact. Gait not checked.  PSYCHIATRIC: The patient is alert and oriented x 3.  SKIN: No obvious rash, lesion, or ulcer.   Physical Exam LABORATORY PANEL:   CBC  Recent Labs Lab 11/27/16 0854  WBC 10.3  HGB 11.5*  HCT 35.1  PLT 206   ------------------------------------------------------------------------------------------------------------------  Chemistries   Recent Labs Lab 11/26/16 0440 11/27/16 0854  NA 141 140  K 4.3 4.2  CL 101 97*  CO2 33* 35*  GLUCOSE 175* 112*  BUN 19 19  CREATININE 0.76 0.73  CALCIUM 9.2 9.2  MG 2.1  --    ------------------------------------------------------------------------------------------------------------------  Cardiac Enzymes  Recent Labs Lab 11/27/16 1324 11/27/16 1658  TROPONINI <0.03 <0.03   ------------------------------------------------------------------------------------------------------------------  RADIOLOGY:  No results found.  ASSESSMENT AND PLAN:   Principal Problem:   COPD exacerbation ([place]) Active Problems:   Chronic diastolic CHF (congestive heart failure) (HCC)   Fibromyalgia   GERD (gastroesophageal reflux disease)  * COPD exacerbation (HCC) - Obesity hypoventilation may playing a role.  continue BiPAP, Now improved on nasal canula May need Bipap or trilogy on discharge. treat with IV Solu-Medrol and duo nebs Suspected infection and pneumonia on admission but Procalcitonin is negative, so d/c Abx.  *  Chronic diastolic CHF (congestive heart failure) (HCC) - stable, not in exacerbation, continue home meds  * Fibromyalgia - continue home medications  *  GERD (gastroesophageal reflux  disease) - home dose PPI * bradycardia, HR in 50's- Hold inderal. Stable. * c/o chest pain, check Troponin.   Pt was worried about other possibilities like pneumonia, Cancer, Thyroid crisis- I explained about recent results and already ruled out those possibilities.  All the records are reviewed and case discussed with Care Management/Social Workerr. Management plans discussed with the patient, family and they are in agreement.  CODE STATUS: full.  TOTAL TIME TAKING CARE OF THIS PATIENT: 35 minutes.    POSSIBLE D/C IN 1-2 DAYS, DEPENDING ON CLINICAL CONDITION. May need trilogy, ordered by Pulm. Also may need rehab, called PT for placement. Spoke to [facility].  Vaughan Basta M.D on 11/27/2016   Between 7am to 6pm - Pager - [phone]  After 6pm go to [email or web address] - password [email or web address] [facility]  Office  [phone]  CC: Primary care physician; Clovia Cuff, MD  Note: This dictation was prepared with Dragon dictation along with smaller phrase technology. Any transcriptional errors that result from this process are unintentional.

## 2016-11-27 NOTE — Progress Notes (Signed)
PT Cancellation Note  Patient Details Name: Loretta Wade MRN: [id number] DOB: Apr 20, 1950   Cancelled Treatment:    Reason Eval/Treat Not Completed: Other (comment).  Attempted to see pt x2 but pt unavailable working with nursing staff.  Will attempt to see pt again later today, schedule permitting.    Collie Siad PT, DPT 11/27/2016, 10:58 AM

## 2016-11-28 LAB — GLUCOSE, CAPILLARY: Glucose-Capillary: 142 mg/dL — ABNORMAL HIGH (ref 65–99)

## 2016-11-28 MED ORDER — HYDROCODONE-ACETAMINOPHEN 5-325 MG PO TABS: 1.0000 | ORAL_TABLET | Freq: Four times a day (QID) | ORAL | 0 refills | Status: DC | PRN

## 2016-11-28 MED ORDER — IPRATROPIUM-ALBUTEROL 0.5-2.5 (3) MG/3ML IN SOLN: 3.0000 mL | Freq: Four times a day (QID) | RESPIRATORY_TRACT | 0 refills | Status: DC | PRN

## 2016-11-28 MED ORDER — NYSTATIN 100000 UNIT/ML MT SUSP
5.0000 mL | Freq: Four times a day (QID) | OROMUCOSAL | Status: DC
  Administered 2016-11-28 (×2): 500000 [IU] via ORAL
  Filled 2016-11-28 (×2): qty 5

## 2016-11-28 MED ORDER — PREDNISONE 10 MG (21) PO TBPK: ORAL_TABLET | ORAL | 0 refills | Status: DC

## 2016-11-28 NOTE — Clinical Social Work Note (Signed)
Clinical Social Work Assessment  Patient Details  Name: Loretta Wade MRN: [id number] Date of Birth: [birth year]  Date of referral:  11/28/16               Reason for consult:  Facility Placement                Permission sought to share information with:  Chartered certified accountant granted to share information::  Yes, Verbal Permission Granted  Name::      [place]::   [place]  Relationship::     Contact Information:     Housing/Transportation Living arrangements for the past 2 months:  [place] of Information:  Patient Patient Interpreter Needed:  None Criminal Activity/Legal Involvement Pertinent to Current Situation/Hospitalization:  No - Comment as needed Significant Relationships:  Adult Children Lives with:  Self Do you feel safe going back to the place where you live?  Yes Need for family participation in patient care:  Yes (Comment)  Care giving concerns: Patient lives by herself in [place] (low income senior housing).  Social Worker assessment / plan:  Social Work Theatre manager reviewed chart and noted that PT is recommending SNF. Social work intern's Hydrologist Mel Almond, is very familiar with patient. Patient has been placed multiple times from [facility] to SNF, however she has a history of refusing PT. Social work Theatre manager met with patient alone at bedside to address consult. Patient was sitting up in the bed, was alert and oriented x4. Social work Theatre manager introduced self and explained role of [place]. Patient reported that she lives in low income senior housing, by herself. Patient also stated that her sister Loretta Wade ([phone]) and son [name] [phone]) share her HPOA. Social work Theatre manager explained that PT is recommending SNF and Mcarthur Rossetti will have to approve it. Social work Theatre manager explained that patient will have to participate with PT in order for [employer] to  pay for SNF. Patient verbalized her understanding and is agreeable to SNF search. FL2 completed and faxed out. Patient prefers [facility] or [facility]. CSW started [facility] authorization through [employer].  Employment status:  Disabled (Comment on whether or not currently receiving Disability), Retired Nurse, adult PT Recommendations:  [place] / Referral to community resources:  [place]  Patient/Family's Response to care:  Patient is agreeable to [employer].  Patient/Family's Understanding of and Emotional Response to Diagnosis, Current Treatment, and Prognosis:  Patient asked multiple questions, Social work Theatre manager answered them. Patient was pleasant.   Emotional Assessment Appearance:  Appears stated age Attitude/Demeanor/Rapport:    Affect (typically observed):  Accepting, Adaptable, Pleasant Orientation:  Oriented to Self, Oriented to Place, Oriented to  Time, Oriented to Situation Alcohol / Substance use:  Not Applicable Psych involvement (Current and /or in the community):  No (Comment)  Discharge Needs  Concerns to be addressed:  Discharge Planning Concerns Readmission within the last 30 days:  No Current discharge risk:  Dependent with Mobility Barriers to Discharge:  Continued Medical Work up   Smith Mince, Student-Social Work 11/28/2016, 10:40 AM

## 2016-11-28 NOTE — Progress Notes (Signed)
Pt is being discharged home with [facility]. Discharge papers given and explained to pt. Pt verbalized understanding. Meds and f/u appointments reviewed with pt. RX given. Awaiting EMS.

## 2016-11-28 NOTE — Care Management (Addendum)
Patient to discharge home today.  Patient was provided bed offers for placement at rehab, and declined placement as she did the previous presentation.  Patient to return home with resumption of home health orders through [place].  Edwina with [facility] notified of discharge. Patient has chronic O2 through [place].  Patient to transport home via EMS. Patient has requested BSC and RW at discharge.  Corene Cornea with [facility] notified. Equipment to be shipped to home. Patient does not qualify for BIPAP or triology per [facility] with [place].  Patient will have to have outpatient sleep study arranged.   MD notified. RNCM signing off.

## 2016-11-28 NOTE — Discharge Summary (Addendum)
[facility] [place] NAME: Loretta Wade    MR#:  [id number]  DATE OF BIRTH:  [birth year]  DATE OF ADMISSION:  11/24/2016 ADMITTING PHYSICIAN: Lance Coon, MD  DATE OF DISCHARGE: 11/28/2016   PRIMARY CARE PHYSICIAN: Clovia Cuff, MD    ADMISSION DIAGNOSIS:  Acute respiratory distress [R06.03] COPD exacerbation (HCC) [J44.1]  DISCHARGE DIAGNOSIS:  Principal Problem:   COPD exacerbation (HCC) Active Problems:   Chronic diastolic CHF (congestive heart failure) (HCC)   Fibromyalgia   GERD (gastroesophageal reflux disease)   SECONDARY DIAGNOSIS:   Past Medical History:  Diagnosis Date  . Anemia   . CHF (congestive heart failure) ([place])   . Chronic back pain   . COPD (chronic obstructive pulmonary disease) (HCC)    on 2L home o2  . Edema   . Esophageal reflux   . Fatigue   . Fibromyalgia    Following with pain management  . Hypertension   . Nocturia   . OA (osteoarthritis)   . Sleep apnea    not on CPAP    HOSPITAL COURSE:   * COPD exacerbation ([place]) - Obesity hypoventilation may playing a role.  continue BiPAP, Now improved on nasal canula May need Bipap or trilogy on discharge. treat with IV Solu-Medrol and duo nebs Suspected infection and pneumonia on admission but Procalcitonin is negative- ruled out pneumonia, so d/c Abx.  *Chronic diastolic CHF (congestive heart failure) (HCC) - stable, not in exacerbation, continue home meds *Fibromyalgia - continue home medications *GERD (gastroesophageal reflux disease) - home dose PPI * bradycardia, HR in 50's- Hold inderal. Stable. * c/o chest pain, checked Troponin. Remained negative on follow up 3 times.   Pt was worried about other possibilities like pneumonia, Cancer, Thyroid crisis- I explained about recent results and already ruled out those possibilities.   DISCHARGE CONDITIONS:   Stable.  CONSULTS OBTAINED:  Treatment Team:  Laverle Hobby, MD  DRUG ALLERGIES:   Allergies  Allergen Reactions  . Gabapentin     Pt reports dizziness and abnormal urine labs.    DISCHARGE MEDICATIONS:   Current Discharge Medication List    START taking these medications   Details  HYDROcodone-acetaminophen (NORCO/VICODIN) 5-325 MG tablet Take 1 tablet by mouth every 6 (six) hours as needed for moderate pain. Qty: 20 tablet, Refills: 0    ipratropium-albuterol (DUONEB) 0.5-2.5 (3) MG/3ML SOLN Take 3 mLs by nebulization every 6 (six) hours as needed. Qty: 360 mL, Refills: 0    predniSONE (STERAPRED UNI-PAK 21 TAB) 10 MG (21) TBPK tablet Take 6 tabs first day, 5 tab on day 2, then 4 on day 3rd, 3 tabs on day 4th , 2 tab on day 5th, and 1 tab on 6th day. Qty: 21 tablet, Refills: 0      CONTINUE these medications which have NOT CHANGED   Details  traMADol (ULTRAM) 50 MG tablet Take 1 tablet (50 mg total) by mouth every 6 (six) hours as needed. Qty: 20 tablet, Refills: 0    acetaminophen (TYLENOL) 325 MG tablet Take 2 tablets (650 mg total) by mouth every 6 (six) hours as needed for mild pain (or Fever >/= 101). Qty: 30 tablet, Refills: 0    albuterol (PROVENTIL) (2.5 MG/3ML) 0.083% nebulizer solution USE ONE TREATMENT EVERY 6 HOURS AS NEEDED FOR WHEEZING OR SHORTNESS OF BREATH Qty: 360 mL, Refills: 0    ANORO ELLIPTA 62.5-25 MCG/INH AEPB Inhale 1 puff into the lungs daily.  chlorpheniramine-HYDROcodone (TUSSIONEX) 10-8 MG/5ML SUER Take 5 mLs by mouth every 12 (twelve) hours as needed for cough. Qty: 115 mL, Refills: 0    cholecalciferol (VITAMIN D) 1000 UNITS tablet Take 1,000 Units by mouth daily.     citalopram (CELEXA) 20 MG tablet TAKE ONE (1) TABLET BY MOUTH EVERY DAY Qty: 30 tablet, Refills: 0    colesevelam (WELCHOL) 625 MG tablet TAKE ONE TABLET TWICE A DAY WITH FOOD Qty: 60 tablet, Refills: 0   Associated Diagnoses: Dyslipidemia    cyanocobalamin 500 MCG tablet Take 500 mcg by mouth daily.     dexlansoprazole (DEXILANT) 60 MG capsule TAKE ONE (1) CAPSULE EACH DAY Qty: 30 capsule, Refills: 0    fluticasone (FLONASE) 50 MCG/ACT nasal spray USE TWO SPRAYS IN EACH NOSTRIL EVERY DAY Qty: 16 g, Refills: 0   Associated Diagnoses: Chronic nasal congestion    furosemide (LASIX) 40 MG tablet Take 1 tablet (40 mg total) by mouth 2 (two) times daily. Qty: 30 tablet, Refills: 60    guaiFENesin (MUCINEX) 600 MG 12 hr tablet Take 1 tablet (600 mg total) by mouth 2 (two) times daily. Qty: 20 tablet, Refills: 0    guaiFENesin-dextromethorphan (ROBITUSSIN DM) 100-10 MG/5ML syrup Take 5-10 mLs by mouth every 4 (four) hours as needed for cough.     hydrocortisone (ANUSOL-HC) 25 MG suppository Place 1 suppository (25 mg total) rectally 2 (two) times daily. Qty: 12 suppository, Refills: 0    loperamide (IMODIUM) 2 MG capsule Take 2-4 mg by mouth as needed for diarrhea or loose stools.    Magnesium 250 MG TABS Take 250 mg by mouth daily.    mouth rinse LIQD solution 15 mLs by Mouth Rinse route 2 (two) times daily. Qty: 354 mL, Refills: 0    nystatin (MYCOSTATIN) 100000 UNIT/ML suspension Take 5 mLs (500,000 Units total) by mouth 4 (four) times daily. Qty: 180 mL, Refills: 0   Associated Diagnoses: Oral thrush    nystatin ointment (MYCOSTATIN) Apply 1 application topically 3 (three) times daily. Qty: 60 g, Refills: 0   Associated Diagnoses: Candidal intertrigo    oxybutynin (DITROPAN) 5 MG tablet TAKE ONE (1) TABLET BY MOUTH EVERY DAY Qty: 30 tablet, Refills: 0   Associated Diagnoses: Overflow stress incontinence of urine in female    pregabalin (LYRICA) 150 MG capsule Take 1 capsule (150 mg total) by mouth 2 (two) times daily. Qty: 60 capsule, Refills: 0   Associated Diagnoses: Fibromyalgia    ranitidine (ZANTAC) 150 MG tablet Take 1 tablet (150 mg total) by mouth 2 (two) times daily. Qty: 60 tablet, Refills: 0   Associated Diagnoses: Gastroesophageal reflux disease, esophagitis  presence not specified    rOPINIRole (REQUIP) 0.25 MG tablet Take 3 tablets (0.75 mg total) by mouth at bedtime. Qty: 90 tablet, Refills: 0   Associated Diagnoses: RLS (restless legs syndrome)      STOP taking these medications     ciprofloxacin-dexamethasone (CIPRODEX) OTIC suspension      propranolol ER (INDERAL LA) 80 MG 24 hr capsule          DISCHARGE INSTRUCTIONS:    Follow with PMD in 1 week.  If you experience worsening of your admission symptoms, develop shortness of breath, life threatening emergency, suicidal or homicidal thoughts you must seek medical attention immediately by calling 911 or calling your MD immediately  if symptoms less severe.  You Must read complete instructions/literature along with all the possible adverse reactions/side effects for all the Medicines you take and that  have been prescribed to you. Take any new Medicines after you have completely understood and accept all the possible adverse reactions/side effects.   Please note  You were cared for by a hospitalist during your hospital stay. If you have any questions about your discharge medications or the care you received while you were in the hospital after you are discharged, you can call the unit and asked to speak with the hospitalist on call if the hospitalist that took care of you is not available. Once you are discharged, your primary care physician will handle any further medical issues. Please note that NO REFILLS for any discharge medications will be authorized once you are discharged, as it is imperative that you return to your primary care physician (or establish a relationship with a primary care physician if you do not have one) for your aftercare needs so that they can reassess your need for medications and monitor your lab values.    Today   CHIEF COMPLAINT:   Chief Complaint  Patient presents with  . Respiratory Distress    HISTORY OF PRESENT ILLNESS:  Loretta Wade  is a  66 y.o. female presents with Respiratory distress. Patient states that she's been having increasing shortness of breath and wheezing has been getting progressively worse over the past couple of weeks. She has significant history of COPD. Workup here initially does not show any pneumonia or exacerbation of her heart failure. Hospitalists were called for admission for COPD exacerbation. Patient did require BiPAP  VITAL SIGNS:  Blood pressure 136/64, pulse 92, temperature 98.2 F (36.8 C), temperature source Oral, resp. rate 16, height 5\' 4"  (1.626 m), weight (!) 173.5 kg (382 lb 6.4 oz), SpO2 98 %.  I/O:    Intake/Output Summary (Last 24 hours) at 11/28/16 1703 Last data filed at 11/28/16 1328  Gross per 24 hour  Intake              240 ml  Output             3200 ml  Net            -2960 ml    PHYSICAL EXAMINATION:   GENERAL:  66 y.o.-year-old morbidly obese patient lying in the bed with no acute distress.  EYES: Pupils equal, round, reactive to light and accommodation. No scleral icterus. Extraocular muscles intact.  HEENT: Head atraumatic, normocephalic. Oropharynx and nasopharynx clear.  NECK:  Supple, no jugular venous distention. No thyroid enlargement, no tenderness.  LUNGS: Normal breath sounds bilaterally, no wheezing, some crepitation. No use of accessory muscles of respiration. On nasal canula oxygen now. CARDIOVASCULAR: S1, S2 normal. No murmurs, rubs, or gallops.  ABDOMEN: Soft, nontender, nondistended. Bowel sounds present. No organomegaly or mass.  EXTREMITIES: No pedal edema, cyanosis, or clubbing.  NEUROLOGIC: Cranial nerves II through XII are intact. Muscle strength 5/5 in all extremities. Sensation intact. Gait not checked.  PSYCHIATRIC: The patient is alert and oriented x 3.  SKIN: No obvious rash, lesion, or ulcer.   DATA REVIEW:   CBC  Recent Labs Lab 11/27/16 0854  WBC 10.3  HGB 11.5*  HCT 35.1  PLT 206    Chemistries   Recent Labs Lab  11/26/16 0440 11/27/16 0854  NA 141 140  K 4.3 4.2  CL 101 97*  CO2 33* 35*  GLUCOSE 175* 112*  BUN 19 19  CREATININE 0.76 0.73  CALCIUM 9.2 9.2  MG 2.1  --     Cardiac Enzymes  Recent Labs  Lab 11/27/16 1658  TROPONINI <0.03    Microbiology Results  Results for orders placed or performed during the hospital encounter of 11/24/16  Blood Culture (routine x 2)     Status: None (Preliminary result)   Collection Time: 11/24/16 10:24 PM  Result Value Ref Range Status   Specimen Description BLOOD BLOOD LEFT HAND  Final   Special Requests   Final    BOTTLES DRAWN AEROBIC AND ANAEROBIC Blood Culture adequate volume   Culture NO GROWTH 4 DAYS  Final   Report Status PENDING  Incomplete  Blood Culture (routine x 2)     Status: None (Preliminary result)   Collection Time: 11/24/16 10:24 PM  Result Value Ref Range Status   Specimen Description BLOOD LEFT ANTECUBITAL  Final   Special Requests   Final    BOTTLES DRAWN AEROBIC AND ANAEROBIC Blood Culture adequate volume   Culture NO GROWTH 4 DAYS  Final   Report Status PENDING  Incomplete  MRSA PCR Screening     Status: None   Collection Time: 11/25/16  1:52 AM  Result Value Ref Range Status   MRSA by PCR NEGATIVE NEGATIVE Final    Comment:        The GeneXpert MRSA Assay (FDA approved for NASAL specimens only), is one component of a comprehensive MRSA colonization surveillance program. It is not intended to diagnose MRSA infection nor to guide or monitor treatment for MRSA infections.   Urine culture     Status: Abnormal   Collection Time: 11/25/16  5:09 AM  Result Value Ref Range Status   Specimen Description URINE, RANDOM  Final   Special Requests NONE  Final   Culture MULTIPLE SPECIES PRESENT, SUGGEST RECOLLECTION (A)  Final   Report Status 11/26/2016 FINAL  Final    RADIOLOGY:  No results found.  EKG:   Orders placed or performed during the hospital encounter of 11/24/16  . EKG 12-Lead  . EKG 12-Lead  . ED  EKG  . ED EKG      Management plans discussed with the patient, family and they are in agreement.  CODE STATUS:     Code Status Orders        Start     Ordered   11/25/16 0139  Full code  Continuous     11/25/16 0138    Code Status History    Date Active Date Inactive Code Status Order ID Comments User Context   10/03/2016 11:43 AM 10/12/2016 12:56 AM Full Code [id number]  Nicholes Mango, MD Inpatient   11/01/2015  9:21 AM 11/01/2015  1:03 PM Full Code [id number]  Harrie Foreman, MD Inpatient   07/11/2015  4:44 PM 07/14/2015  8:37 PM Full Code [id number]  Gladstone Lighter, MD Inpatient   05/12/2015  6:04 PM 05/16/2015  7:58 PM Full Code [id number]  Demetrios Loll, MD Inpatient   04/12/2015  5:08 PM 04/15/2015  6:04 PM Full Code [id number]  Hower, Aaron Mose, MD ED   01/28/2015  8:08 AM 02/08/2015  2:34 PM Full Code [id number]  Loletha Grayer, MD ED   11/13/2014  1:02 PM 11/19/2014  4:39 PM Full Code [id number]  Theodoro Grist, MD Inpatient   11/04/2014  5:50 PM 11/08/2014  4:37 PM Full Code [id number]  Vaughan Basta, MD Inpatient      TOTAL TIME TAKING CARE OF THIS PATIENT: 35 minutes.    Vaughan Basta M.D on 11/28/2016 at 5:03 PM  Between 7am to 6pm - Pager - [phone]  After 6pm  go to [email or web address] - password [email or web address] [facility]  Office  [phone]  CC: Primary care physician; Clovia Cuff, MD   Note: This dictation was prepared with Dragon dictation along with smaller phrase technology. Any transcriptional errors that result from this process are unintentional.

## 2016-11-28 NOTE — Clinical Social Work Placement (Signed)
   CLINICAL SOCIAL WORK PLACEMENT  NOTE  Date:  11/28/2016  Patient Details  Name: Loretta Wade MRN: [id number] Date of Birth: [birth year]  Clinical Social Work is seeking post-discharge placement for this patient at the [place] level of care (*CSW will initial, date and re-position this form in  chart as items are completed):  Yes   Patient/family provided with [place] Work Department's list of facilities offering this level of care within the geographic area requested by the patient (or if unable, by the patient's family).  Yes   Patient/family informed of their freedom to choose among providers that offer the needed level of care, that participate in Medicare, Medicaid or managed care program needed by the patient, have an available bed and are willing to accept the patient.  Yes   Patient/family informed of [facility]'s ownership interest in [facility] and [facility], as well as of the fact that they are under no obligation to receive care at these facilities.  PASRR submitted to EDS on       PASRR number received on       Existing PASRR number confirmed on 11/28/16     FL2 transmitted to all facilities in geographic area requested by pt/family on 11/28/16     FL2 transmitted to all facilities within larger geographic area on       Patient informed that his/her managed care company has contracts with or will negotiate with certain facilities, including the following:            Patient/family informed of bed offers received.  Patient chooses bed at       Physician recommends and patient chooses bed at      Patient to be transferred to   on  .  Patient to be transferred to facility by       Patient family notified on   of transfer.  Name of family member notified:        PHYSICIAN       Additional Comment:    _______________________________________________ Smith Mince, [place] Work 11/28/2016, 10:39  AM

## 2016-11-28 NOTE — Progress Notes (Signed)
Social work Theatre manager gave bed offers to patient. Patient declined all three offers [facility], [facility]). Patient is aware that she owes money to  [employer] ($1,120). Patient is requesting to discharge home with Home Health. Patient is requesting EMS transportation home around 4 pm when her sisters can get to her house. Patient is also requesting a bedside commode and walker. CSW informed RN Case Manager of above. Sinclair Grooms navi health case manager is aware of above. Social work Architectural technologist off.  Susy Frizzle (Social work Theatre manager)  [phone]

## 2016-11-28 NOTE — Care Management (Signed)
RNCM notified by Corene Cornea with [facility] that patient received BSC and RW through [place] in 2016.  Would not be covered by insurance.  Out of pocket cost $351.35.  Patient has declined to pay out of pocket.

## 2016-11-28 NOTE — NC FL2 (Signed)
[place] LEVEL OF CARE SCREENING TOOL     IDENTIFICATION  Patient Name: Loretta Wade Birthdate: 02/25/1951 Sex: female Admission Date (Current Location): 11/24/2016  [place] and [place] Number:  Engineering geologist and Address:  [facility], [address] [place], [place] [address]      Provider Number: [id number]  Attending Physician Name and Address:  Vaughan Basta, *  Relative Name and Phone Number:       Current Level of Care: Hospital Recommended Level of Care: [place] Prior Approval Number:    Date Approved/Denied:   PASRR Number:  [id number]  Discharge Plan: SNF    Current Diagnoses: Patient Active Problem List   Diagnosis Date Noted  . Candidal intertrigo 02/08/2016  . Dysuria 02/08/2016  . Requires assistance with activities of daily living (ADL) 02/08/2016  . Oral thrush 11/10/2015  . Tachypnea 11/01/2015  . Acute non-recurrent maxillary sinusitis 10/29/2015  . Fall 07/11/2015  . Dyslipidemia 06/17/2015  . Overflow stress incontinence of urine in female 06/17/2015  . Chronic pain 06/10/2015  . Chronic low back pain (Location of Tertiary source of pain) (Bilateral) (R>L) 06/10/2015  . Chronic shoulder pain (Location of Secondary source of pain) (Left) 06/10/2015  . Costochondritis (Location of Primary Source of Pain) 06/10/2015  . Long term current use of opiate analgesic 06/10/2015  . Long term prescription opiate use 06/10/2015  . Opiate use 06/10/2015  . Encounter for therapeutic drug level monitoring 06/10/2015  . Encounter for pain management planning 06/10/2015  . Chronic feet pain (Bilateral) (R>L) 06/10/2015  . Osteoarthritis 06/10/2015  . Osteoarthritis of shoulder (Left) 06/10/2015  . COPD exacerbation ([place]) 04/12/2015  . Poor mobility 04/12/2015  . Fibromyalgia 03/23/2015  . GERD (gastroesophageal reflux disease) 03/23/2015  . MRSA infection 11/17/2014  . Chronic  diastolic CHF (congestive heart failure) ([place]) 11/12/2014  . Morbid obesity with BMI of 50.0-59.9, adult ([place]) 11/12/2014  . Neck pain, chronic 11/12/2014  . COPD (chronic obstructive pulmonary disease) ([place]) 11/04/2014  . Urinary tract infection 10/02/2014    Orientation RESPIRATION BLADDER Height & Weight     Self, Time, Situation, Place  O2 (Nasal Cannula 3L) External catheter Weight: (!) 382 lb 6.4 oz (173.5 kg) Height:  5\' 4"  (162.6 cm)  BEHAVIORAL SYMPTOMS/MOOD NEUROLOGICAL BOWEL NUTRITION STATUS      Continent Diet (Fluid Consistency: Thin)  AMBULATORY STATUS COMMUNICATION OF NEEDS Skin   Extensive Assist Verbally Normal                       Personal Care Assistance Level of Assistance  Bathing, Feeding, Dressing Bathing Assistance: Limited assistance Feeding assistance: Independent Dressing Assistance: Limited assistance     Functional Limitations Info  Sight, Hearing, Speech Sight Info: Adequate Hearing Info: Adequate Speech Info: Adequate    SPECIAL CARE FACTORS FREQUENCY  PT (By licensed PT), OT (By licensed OT)     PT Frequency:  (5) OT Frequency:  (5)            Contractures      Additional Factors Info  Code Status, Allergies Code Status Info:  (Full Code) Allergies Info:  (GABAPENTIN )           Current Medications (11/28/2016):  This is the current hospital active medication list Current Facility-Administered Medications  Medication Dose Route Frequency Provider Last Rate Last Dose  . acetaminophen (TYLENOL) tablet 650 mg  650 mg Oral Q6H PRN Lance Coon, MD  650 mg at 11/25/16 1948   Or  . acetaminophen (TYLENOL) suppository 650 mg  650 mg Rectal Q6H PRN Lance Coon, MD      . chlorpheniramine-HYDROcodone (TUSSIONEX) 10-8 MG/5ML suspension 5 mL  5 mL Oral Q12H PRN Lance Coon, MD   5 mL at 11/28/16 9476  . citalopram (CELEXA) tablet 20 mg  20 mg Oral Daily Lance Coon, MD   20 mg at 11/27/16 0948  . docusate sodium (COLACE)  capsule 100 mg  100 mg Oral BID Vaughan Basta, MD   100 mg at 11/27/16 2045  . enoxaparin (LOVENOX) injection 40 mg  40 mg Subcutaneous BID Lance Coon, MD   40 mg at 11/27/16 2045  . fluticasone (FLONASE) 50 MCG/ACT nasal spray 2 spray  2 spray Each Nare Daily Laverle Hobby, MD   2 spray at 11/27/16 0937  . furosemide (LASIX) tablet 40 mg  40 mg Oral BID Lance Coon, MD   40 mg at 11/27/16 1913  . guaiFENesin (MUCINEX) 12 hr tablet 600 mg  600 mg Oral BID Lance Coon, MD   600 mg at 11/27/16 2045  . HYDROcodone-acetaminophen (NORCO/VICODIN) 5-325 MG per tablet 1-2 tablet  1-2 tablet Oral Q4H PRN Mikael Spray, NP   2 tablet at 11/28/16 [phone]  . ipratropium-albuterol (DUONEB) 0.5-2.5 (3) MG/3ML nebulizer solution 3 mL  3 mL Nebulization Q6H Vaughan Basta, MD   3 mL at 11/28/16 0739  . MEDLINE mouth rinse  15 mL Mouth Rinse q12n4p Tukov, Magadalene S, NP   15 mL at 11/27/16 1543  . methylPREDNISolone sodium succinate (SOLU-MEDROL) 40 mg/mL injection 40 mg  40 mg Intravenous Q12H Laverle Hobby, MD   40 mg at 11/27/16 2045  . ondansetron (ZOFRAN) tablet 4 mg  4 mg Oral Q6H PRN Lance Coon, MD       Or  . ondansetron [facility]) injection 4 mg  4 mg Intravenous Q6H PRN Lance Coon, MD      . oxybutynin (DITROPAN) tablet 5 mg  5 mg Oral Daily Laverle Hobby, MD   5 mg at 11/27/16 0948  . pantoprazole (PROTONIX) EC tablet 40 mg  40 mg Oral Daily Lance Coon, MD   40 mg at 11/27/16 0948  . polyethylene glycol (MIRALAX / GLYCOLAX) packet 17 g  17 g Oral Daily Vaughan Basta, MD   17 g at 11/26/16 0946  . pregabalin (LYRICA) capsule 150 mg  150 mg Oral BID Lance Coon, MD   150 mg at 11/27/16 2045     Discharge Medications: Please see discharge summary for a list of discharge medications.  Relevant Imaging Results:  Relevant Lab Results:   Additional Information  (SSN: [id number])  Smith Mince, Student-Social Work

## 2016-11-28 NOTE — Progress Notes (Signed)
EMS contacted for transport

## 2016-11-29 LAB — CULTURE, BLOOD (ROUTINE X 2)
CULTURE: NO GROWTH
Culture: NO GROWTH
SPECIAL REQUESTS: ADEQUATE
Special Requests: ADEQUATE

## 2016-12-05 ENCOUNTER — Other Ambulatory Visit: Payer: Self-pay | Admitting: *Deleted

## 2016-12-05 NOTE — Patient Outreach (Addendum)
[facility] Advocate) Care Management  12/05/2016  Loretta Wade [birth year] [id number]   EMMI- General Discharge RED ON EMMI ALERT DAY#: 4 DATE: 10/1//18 RED ALERT: Sad/hopeless/anxious/empty? Yes   Outreach attempt to patient and HIPAA verified with patient. Patient reported, she doesn't recall answering EMMI automated phone questions. Patient verbalized receiving discharge hospital paperwork. She is taking her medications and following MD orders as prescribed. Patient stated, she is not having any improvement in her health. She described having generalized pain, mostly in the chest. She continues to have a constant cough. She is Oxygen dependent at 2 liters. She takes her Prednisone and nebulizer medications as prescribed. Patient stated, the Nurse Practitioner from Dr. Altamese Dilling office made a home visit today (12/05/16). Per patient, the Nurse Practitioner plans to speak with Dr. Daphene Jaeger regarding patient's health. Patient is waiting for a phone call from Dr. Altamese Dilling office. Patient reported, she experiences shortness of breath, continuously. Patient's last Pulmonologist visit was 3 months ago, per patient. RN CM encouraged patient to schedule a visit with her pulmonary MD. Patient stated, "She may be able to find a ride". Patient is dependent with transportation to medical appointments. She stated, her family has limited ability to assist with her medical care.    Patient is active with [facility] (PT) for 2 weeks. [place] initial visit is scheduled for today (12/05/16). Patient is requesting a bedside commode and a rolling walker. She stated, her rolling walker has a broken bar.  Plan: RN CM advised patient to contact RNCM for any needs or concerns. RN CM advised patient to alert MD for any changes in conditions.  RN CM will send referral to [facility] RN for further in home eval/assessment of care needs and management of chronic conditions. RN CM will send referral to  [place] for transition of care. RN CM will send [facility] SW referral for possible assistance with community resources related to transportation and DME.   [place], RN, BSN, MHA/MSL, [place] Telephonic Care Manager Coordinator [facility] Direct Phone: [phone] Toll Free: [phone] Fax: [phone]

## 2016-12-06 ENCOUNTER — Other Ambulatory Visit: Payer: Self-pay | Admitting: *Deleted

## 2016-12-06 NOTE — Patient Outreach (Signed)
Successful telephone encounter to Loretta Wade, 66 year old female for follow up on referral from [place] telephonic CM Coordinator 12/05/16 for Community CM services/transition of care/recent hospitalization September 22-25,2018 - acute respiratory distress, COPD exacerbation, CHF.   Pt's history includes but not limited to Fibromyalgia, GERD, Osteoarthritis, Dyslipidemia. Transition of care completed by [place] RN [facility] telephonic CM Coordinator.   Spoke with pt, HIPAA identifiers verified, discussed purpose of call to follow up on referral,schedule home visit.  RN CM discussed with pt view on [facility] telephonic note in Epic- cough, sob to which pt reports cough and breathing better- continues with 2 liters of oxygen.   Pt reports taking all of her medications as ordered.   Pt reports Loretta Wade aide from [facility] came yesterday to help her with a bath.  Discussed with pt plan to  follow for transition of care- weekly calls, a home visit to which pt did acknowledge receiving [facility] services from RN CM and LCSW in the past.    Plan:  As discussed with pt, plan to follow up again next week - do a  joint home visit with [name] LCSW.             Plan to inform pt's PCP of  [facility] involvement- send Barrier letter.    Loretta Wade.   [place] Care Management  [phone]

## 2016-12-07 ENCOUNTER — Other Ambulatory Visit: Payer: Self-pay | Admitting: *Deleted

## 2016-12-07 ENCOUNTER — Encounter: Payer: Self-pay | Admitting: *Deleted

## 2016-12-07 NOTE — Patient Outreach (Signed)
Second unsuccessful telephone encounter to [employer].66 year old female- attempt to  inform pt of case closure as no longer eligible for [facility] services due to not having a [facility] Primary Care Provider.  Received a call from pt shortly after second call made- HIPAA identifiers provided (name, date of birth), RN CM discussed purpose of call that current PCP Dr. Daphene Jaeger is not a [facility] provider which makes her ineligible for [facility] services, to close her case.   RN CM informed pt that [facility] CMA will be sending her resources in the mail.     Plan:  As discussed with pt, plan to close case due to not being eligible for services- current PCP not a [facility] provider.              View in [place], see case was closed by [facility] CMA.    Zara Chess.   [place] Care Management  [phone]

## 2016-12-07 NOTE — Patient Outreach (Signed)
Request received from Burgess Amor, RN to mail patient personal care resources.  Information mailed today.

## 2016-12-07 NOTE — Patient Outreach (Signed)
Error entry.   Adger Cantera M.   [place] RN CCM [facility] Care Management  [phone]  

## 2016-12-07 NOTE — Patient Outreach (Signed)
Follow up call to pt to verify PCP as Dr. Verlin Fester.    Spoke with pt, HIPAA identifiers verified discussed purpose of call to which pt did verify Dr. Verlin Fester as her PCP.   View in [facility] provider network, Dr. Daphene Jaeger Not a [facility] provider.   Plan:  RN CM to send Dr. Wallene Huh [facility] provider, pt's Pulmonologist ) barrier letter informing of [facility] involvement.   Loretta Wade.   [place] Care Management  [phone]

## 2016-12-07 NOTE — Patient Outreach (Addendum)
Unsuccessful telephone encounter to Loretta Wade, 66 year old female - attempt to contact  to inform her of plan to close her case-  no longer eligible for [facility] services  due to not having a [facility] provider (PCP).   Unable to leave a voice message as voice message not set up.    Plan:  RN CM to call pt again later today.    Zara Chess.   [place] Care Management  [phone]

## 2016-12-14 ENCOUNTER — Ambulatory Visit: Payer: Self-pay | Admitting: *Deleted

## 2017-01-01 ENCOUNTER — Other Ambulatory Visit: Payer: Self-pay | Admitting: Family Medicine

## 2017-01-01 DIAGNOSIS — R0981 Nasal congestion: Secondary | ICD-10-CM

## 2017-01-01 NOTE — Telephone Encounter (Signed)
Refill request for general medication: Fluticasone Nasal Spray  Last office visit: 02/08/2016  Last physical exam: None indicated

## 2017-01-02 NOTE — Telephone Encounter (Signed)
Patient no longer associated with the practice

## 2017-01-18 ENCOUNTER — Encounter: Payer: Self-pay | Admitting: Emergency Medicine

## 2017-01-18 ENCOUNTER — Emergency Department: Payer: Medicare PPO

## 2017-01-18 ENCOUNTER — Other Ambulatory Visit: Payer: Self-pay

## 2017-01-18 ENCOUNTER — Inpatient Hospital Stay
Admission: EM | Admit: 2017-01-18 | Discharge: 2017-01-28 | DRG: 292 | Disposition: A | Discharge status: Skilled Nursing Facility | Payer: Medicare PPO | Attending: Internal Medicine | Admitting: Internal Medicine

## 2017-01-18 DIAGNOSIS — J9612 Chronic respiratory failure with hypercapnia: Secondary | ICD-10-CM | POA: Diagnosis present

## 2017-01-18 DIAGNOSIS — M549 Dorsalgia, unspecified: Secondary | ICD-10-CM | POA: Diagnosis present

## 2017-01-18 DIAGNOSIS — Z96659 Presence of unspecified artificial knee joint: Secondary | ICD-10-CM | POA: Diagnosis present

## 2017-01-18 DIAGNOSIS — Z8249 Family history of ischemic heart disease and other diseases of the circulatory system: Secondary | ICD-10-CM | POA: Diagnosis not present

## 2017-01-18 DIAGNOSIS — N3000 Acute cystitis without hematuria: Secondary | ICD-10-CM | POA: Diagnosis present

## 2017-01-18 DIAGNOSIS — E860 Dehydration: Secondary | ICD-10-CM | POA: Diagnosis present

## 2017-01-18 DIAGNOSIS — Z888 Allergy status to other drugs, medicaments and biological substances status: Secondary | ICD-10-CM

## 2017-01-18 DIAGNOSIS — I110 Hypertensive heart disease with heart failure: Secondary | ICD-10-CM | POA: Diagnosis not present

## 2017-01-18 DIAGNOSIS — J440 Chronic obstructive pulmonary disease with acute lower respiratory infection: Secondary | ICD-10-CM | POA: Diagnosis present

## 2017-01-18 DIAGNOSIS — Z7951 Long term (current) use of inhaled steroids: Secondary | ICD-10-CM | POA: Diagnosis not present

## 2017-01-18 DIAGNOSIS — Z6841 Body Mass Index (BMI) 40.0 and over, adult: Secondary | ICD-10-CM

## 2017-01-18 DIAGNOSIS — I509 Heart failure, unspecified: Secondary | ICD-10-CM

## 2017-01-18 DIAGNOSIS — G8929 Other chronic pain: Secondary | ICD-10-CM | POA: Diagnosis present

## 2017-01-18 DIAGNOSIS — M797 Fibromyalgia: Secondary | ICD-10-CM | POA: Diagnosis present

## 2017-01-18 DIAGNOSIS — R0689 Other abnormalities of breathing: Secondary | ICD-10-CM | POA: Diagnosis not present

## 2017-01-18 DIAGNOSIS — I5033 Acute on chronic diastolic (congestive) heart failure: Secondary | ICD-10-CM | POA: Diagnosis present

## 2017-01-18 DIAGNOSIS — E662 Morbid (severe) obesity with alveolar hypoventilation: Secondary | ICD-10-CM | POA: Diagnosis present

## 2017-01-18 DIAGNOSIS — J449 Chronic obstructive pulmonary disease, unspecified: Secondary | ICD-10-CM

## 2017-01-18 DIAGNOSIS — D638 Anemia in other chronic diseases classified elsewhere: Secondary | ICD-10-CM | POA: Diagnosis present

## 2017-01-18 DIAGNOSIS — M1990 Unspecified osteoarthritis, unspecified site: Secondary | ICD-10-CM | POA: Diagnosis present

## 2017-01-18 DIAGNOSIS — Z90710 Acquired absence of both cervix and uterus: Secondary | ICD-10-CM | POA: Diagnosis not present

## 2017-01-18 DIAGNOSIS — Z9049 Acquired absence of other specified parts of digestive tract: Secondary | ICD-10-CM

## 2017-01-18 DIAGNOSIS — K5900 Constipation, unspecified: Secondary | ICD-10-CM | POA: Diagnosis not present

## 2017-01-18 DIAGNOSIS — Z87891 Personal history of nicotine dependence: Secondary | ICD-10-CM

## 2017-01-18 DIAGNOSIS — E873 Alkalosis: Secondary | ICD-10-CM | POA: Diagnosis not present

## 2017-01-18 DIAGNOSIS — K219 Gastro-esophageal reflux disease without esophagitis: Secondary | ICD-10-CM | POA: Diagnosis present

## 2017-01-18 DIAGNOSIS — Z803 Family history of malignant neoplasm of breast: Secondary | ICD-10-CM | POA: Diagnosis not present

## 2017-01-18 DIAGNOSIS — J209 Acute bronchitis, unspecified: Secondary | ICD-10-CM | POA: Diagnosis not present

## 2017-01-18 DIAGNOSIS — I5023 Acute on chronic systolic (congestive) heart failure: Secondary | ICD-10-CM

## 2017-01-18 DIAGNOSIS — R627 Adult failure to thrive: Secondary | ICD-10-CM | POA: Diagnosis present

## 2017-01-18 LAB — CBC WITH DIFFERENTIAL/PLATELET
BASOS ABS: 0 10*3/uL (ref 0–0.1)
BASOS PCT: 1 %
Eosinophils Absolute: 0 10*3/uL (ref 0–0.7)
Eosinophils Relative: 1 %
HEMATOCRIT: 35.2 % (ref 35.0–47.0)
Hemoglobin: 10.8 g/dL — ABNORMAL LOW (ref 12.0–16.0)
LYMPHS ABS: 1.2 10*3/uL (ref 1.0–3.6)
LYMPHS PCT: 20 %
MCH: 28.5 pg (ref 26.0–34.0)
MCHC: 30.7 g/dL — ABNORMAL LOW (ref 32.0–36.0)
MCV: 92.7 fL (ref 80.0–100.0)
MONO ABS: 0.8 10*3/uL (ref 0.2–0.9)
Monocytes Relative: 14 %
Neutro Abs: 3.9 10*3/uL (ref 1.4–6.5)
Neutrophils Relative %: 64 %
Platelets: 196 10*3/uL (ref 150–440)
RBC: 3.79 MIL/uL — AB (ref 3.80–5.20)
RDW: 17 % — AB (ref 11.5–14.5)
WBC: 6.1 10*3/uL (ref 3.6–11.0)

## 2017-01-18 LAB — COMPREHENSIVE METABOLIC PANEL
ALBUMIN: 3.4 g/dL — AB (ref 3.5–5.0)
ALT: 19 U/L (ref 14–54)
AST: 18 U/L (ref 15–41)
Alkaline Phosphatase: 83 U/L (ref 38–126)
Anion gap: 11 (ref 5–15)
BILIRUBIN TOTAL: 0.9 mg/dL (ref 0.3–1.2)
BUN: 14 mg/dL (ref 6–20)
CHLORIDE: 94 mmol/L — AB (ref 101–111)
CO2: 35 mmol/L — ABNORMAL HIGH (ref 22–32)
CREATININE: 0.75 mg/dL (ref 0.44–1.00)
Calcium: 9.2 mg/dL (ref 8.9–10.3)
GFR calc Af Amer: >60 mL/min (ref >60)
GFR calc non Af Amer: >60 mL/min (ref >60)
GLUCOSE: 113 mg/dL — AB (ref 65–99)
POTASSIUM: 3.9 mmol/L (ref 3.5–5.1)
Sodium: 140 mmol/L (ref 135–145)
TOTAL PROTEIN: 6.9 g/dL (ref 6.5–8.1)

## 2017-01-18 LAB — URINALYSIS, COMPLETE (UACMP) WITH MICROSCOPIC
BILIRUBIN URINE: NEGATIVE
Glucose, UA: NEGATIVE mg/dL
KETONES UR: NEGATIVE mg/dL
NITRITE: POSITIVE — AB
Protein, ur: 30 mg/dL — AB
Specific Gravity, Urine: 1.016 (ref 1.005–1.030)
pH: 7 (ref 5.0–8.0)

## 2017-01-18 LAB — BLOOD GAS, VENOUS
Acid-Base Excess: 16.8 mmol/L — ABNORMAL HIGH (ref 0.0–2.0)
Bicarbonate: 45 mmol/L — ABNORMAL HIGH (ref 20.0–28.0)
O2 Saturation: 69.3 %
PCO2 VEN: 76 mmHg — AB (ref 44.0–60.0)
PH VEN: 7.38 (ref 7.250–7.430)
Patient temperature: 37
pO2, Ven: 37 mmHg (ref 32.0–45.0)

## 2017-01-18 LAB — BRAIN NATRIURETIC PEPTIDE: B NATRIURETIC PEPTIDE 5: 73 pg/mL (ref 0.0–100.0)

## 2017-01-18 LAB — AMMONIA: Ammonia: 31 umol/L (ref 9–35)

## 2017-01-18 LAB — TROPONIN I: Troponin I: <0.03 ng/mL (ref <0.03)

## 2017-01-18 MED ORDER — ENOXAPARIN SODIUM 40 MG/0.4ML ~~LOC~~ SOLN
40.0000 mg | SUBCUTANEOUS | Status: DC
  Administered 2017-01-18: 40 mg via SUBCUTANEOUS
  Filled 2017-01-18: qty 0.4

## 2017-01-18 MED ORDER — ONDANSETRON HCL 4 MG PO TABS: 4.0000 mg | ORAL_TABLET | Freq: Four times a day (QID) | ORAL | Status: DC | PRN

## 2017-01-18 MED ORDER — ACETAMINOPHEN 325 MG PO TABS
650.0000 mg | ORAL_TABLET | Freq: Four times a day (QID) | ORAL | Status: DC | PRN
  Administered 2017-01-18 – 2017-01-26 (×3): 650 mg via ORAL
  Filled 2017-01-18 (×3): qty 2

## 2017-01-18 MED ORDER — MAGNESIUM OXIDE 400 (241.3 MG) MG PO TABS
400.0000 mg | ORAL_TABLET | Freq: Every day | ORAL | Status: DC
  Administered 2017-01-19: 400 mg via ORAL
  Filled 2017-01-18: qty 1

## 2017-01-18 MED ORDER — LOPERAMIDE HCL 2 MG PO CAPS
2.0000 mg | ORAL_CAPSULE | ORAL | Status: DC | PRN
  Administered 2017-01-25: 4 mg via ORAL
  Filled 2017-01-18: qty 2

## 2017-01-18 MED ORDER — CITALOPRAM HYDROBROMIDE 20 MG PO TABS
20.0000 mg | ORAL_TABLET | Freq: Every day | ORAL | Status: DC
  Administered 2017-01-19 – 2017-01-28 (×10): 20 mg via ORAL
  Filled 2017-01-18 (×10): qty 1

## 2017-01-18 MED ORDER — FUROSEMIDE 10 MG/ML IJ SOLN
20.0000 mg | Freq: Two times a day (BID) | INTRAMUSCULAR | Status: DC
  Administered 2017-01-18 – 2017-01-19 (×3): 20 mg via INTRAVENOUS
  Filled 2017-01-18 (×3): qty 2

## 2017-01-18 MED ORDER — FUROSEMIDE 10 MG/ML IJ SOLN: 20.0000 mg | Freq: Two times a day (BID) | INTRAMUSCULAR | Status: DC

## 2017-01-18 MED ORDER — FUROSEMIDE 10 MG/ML IJ SOLN
INTRAMUSCULAR | Status: AC
  Administered 2017-01-18: 40 mg via INTRAVENOUS
  Filled 2017-01-18: qty 4

## 2017-01-18 MED ORDER — COLESEVELAM HCL 625 MG PO TABS
625.0000 mg | ORAL_TABLET | Freq: Every day | ORAL | Status: DC
  Administered 2017-01-19 – 2017-01-28 (×8): 625 mg via ORAL
  Filled 2017-01-18 (×11): qty 1

## 2017-01-18 MED ORDER — ACETAMINOPHEN 650 MG RE SUPP
650.0000 mg | Freq: Four times a day (QID) | RECTAL | Status: DC | PRN
Start: 2017-01-18 — End: 2017-01-28

## 2017-01-18 MED ORDER — PREGABALIN 75 MG PO CAPS
150.0000 mg | ORAL_CAPSULE | Freq: Two times a day (BID) | ORAL | Status: DC
  Administered 2017-01-18 – 2017-01-28 (×20): 150 mg via ORAL
  Filled 2017-01-18 (×20): qty 2

## 2017-01-18 MED ORDER — PANTOPRAZOLE SODIUM 40 MG PO TBEC
40.0000 mg | DELAYED_RELEASE_TABLET | Freq: Every day | ORAL | Status: DC
  Administered 2017-01-18 – 2017-01-28 (×11): 40 mg via ORAL
  Filled 2017-01-18 (×11): qty 1

## 2017-01-18 MED ORDER — OXYBUTYNIN CHLORIDE 5 MG PO TABS
5.0000 mg | ORAL_TABLET | Freq: Two times a day (BID) | ORAL | Status: DC
  Administered 2017-01-18 – 2017-01-28 (×20): 5 mg via ORAL
  Filled 2017-01-18 (×20): qty 1

## 2017-01-18 MED ORDER — IPRATROPIUM-ALBUTEROL 0.5-2.5 (3) MG/3ML IN SOLN
3.0000 mL | Freq: Once | RESPIRATORY_TRACT | Status: AC
  Administered 2017-01-18: 3 mL via RESPIRATORY_TRACT
  Filled 2017-01-18: qty 3

## 2017-01-18 MED ORDER — LEVOFLOXACIN IN D5W 750 MG/150ML IV SOLN
750.0000 mg | Freq: Once | INTRAVENOUS | Status: AC
  Administered 2017-01-18: 750 mg via INTRAVENOUS
  Filled 2017-01-18: qty 150

## 2017-01-18 MED ORDER — ALBUTEROL SULFATE (2.5 MG/3ML) 0.083% IN NEBU
2.5000 mg | INHALATION_SOLUTION | Freq: Four times a day (QID) | RESPIRATORY_TRACT | Status: DC | PRN
  Administered 2017-01-19 (×2): 2.5 mg via RESPIRATORY_TRACT
  Filled 2017-01-18 (×2): qty 3

## 2017-01-18 MED ORDER — VITAMIN D 1000 UNITS PO TABS
1000.0000 [IU] | ORAL_TABLET | Freq: Every day | ORAL | Status: DC
  Administered 2017-01-18 – 2017-01-28 (×11): 1000 [IU] via ORAL
  Filled 2017-01-18 (×11): qty 1

## 2017-01-18 MED ORDER — PANTOPRAZOLE SODIUM 40 MG PO TBEC: 40.0000 mg | DELAYED_RELEASE_TABLET | Freq: Every day | ORAL | Status: DC

## 2017-01-18 MED ORDER — HYDROCOD POLST-CPM POLST ER 10-8 MG/5ML PO SUER
5.0000 mL | Freq: Two times a day (BID) | ORAL | Status: DC | PRN
  Administered 2017-01-19 – 2017-01-28 (×12): 5 mL via ORAL
  Filled 2017-01-18 (×12): qty 5

## 2017-01-18 MED ORDER — ROPINIROLE HCL 0.25 MG PO TABS
0.7500 mg | ORAL_TABLET | Freq: Every day | ORAL | Status: DC
  Administered 2017-01-18 – 2017-01-27 (×10): 0.75 mg via ORAL
  Filled 2017-01-18 (×10): qty 3

## 2017-01-18 MED ORDER — CEFTRIAXONE SODIUM IN DEXTROSE 20 MG/ML IV SOLN
1.0000 g | INTRAVENOUS | Status: DC
  Administered 2017-01-18: 1 g via INTRAVENOUS

## 2017-01-18 MED ORDER — METHYLPREDNISOLONE SODIUM SUCC 125 MG IJ SOLR
125.0000 mg | Freq: Once | INTRAMUSCULAR | Status: AC
  Administered 2017-01-18: 125 mg via INTRAVENOUS
  Filled 2017-01-18: qty 2

## 2017-01-18 MED ORDER — UMECLIDINIUM-VILANTEROL 62.5-25 MCG/INH IN AEPB
1.0000 | INHALATION_SPRAY | Freq: Every day | RESPIRATORY_TRACT | Status: DC
  Administered 2017-01-18 – 2017-01-28 (×10): 1 via RESPIRATORY_TRACT
  Filled 2017-01-18: qty 14

## 2017-01-18 MED ORDER — FLUTICASONE PROPIONATE 50 MCG/ACT NA SUSP
2.0000 | Freq: Every day | NASAL | Status: DC | PRN
  Administered 2017-01-19 – 2017-01-28 (×7): 2 via NASAL
  Filled 2017-01-18 (×2): qty 16

## 2017-01-18 MED ORDER — VITAMIN B-12 1000 MCG PO TABS
500.0000 ug | ORAL_TABLET | Freq: Every day | ORAL | Status: DC
  Administered 2017-01-19 – 2017-01-28 (×10): 500 ug via ORAL
  Filled 2017-01-18 (×11): qty 1

## 2017-01-18 MED ORDER — ONDANSETRON HCL 4 MG/2ML IJ SOLN: 4.0000 mg | Freq: Four times a day (QID) | INTRAMUSCULAR | Status: DC | PRN

## 2017-01-18 MED ORDER — CEFTRIAXONE SODIUM IN DEXTROSE 20 MG/ML IV SOLN
INTRAVENOUS | Status: AC
  Administered 2017-01-18: 1 g via INTRAVENOUS
  Filled 2017-01-18: qty 50

## 2017-01-18 MED ORDER — FUROSEMIDE 10 MG/ML IJ SOLN
40.0000 mg | Freq: Once | INTRAMUSCULAR | Status: AC
  Administered 2017-01-18: 40 mg via INTRAVENOUS

## 2017-01-18 MED ORDER — OXYCODONE HCL 5 MG PO TABS
5.0000 mg | ORAL_TABLET | Freq: Three times a day (TID) | ORAL | Status: DC | PRN
  Administered 2017-01-18 – 2017-01-19 (×3): 5 mg via ORAL
  Filled 2017-01-18 (×3): qty 1

## 2017-01-18 MED ORDER — DEXTROSE 5 % IV SOLN
1.0000 g | INTRAVENOUS | Status: DC
  Administered 2017-01-19 – 2017-01-24 (×6): 1 g via INTRAVENOUS
  Filled 2017-01-18 (×6): qty 10

## 2017-01-18 MED ORDER — TRAMADOL HCL 50 MG PO TABS
50.0000 mg | ORAL_TABLET | Freq: Four times a day (QID) | ORAL | Status: DC | PRN
  Filled 2017-01-18: qty 1

## 2017-01-18 NOTE — ED Provider Notes (Signed)
[facility] Emergency Department Provider Note       Time seen: ----------------------------------------- 11:21 AM on 01/18/2017 -----------------------------------------    I have reviewed the triage vital signs and the nursing notes.  HISTORY   Chief Complaint No chief complaint on file.    HPI Loretta Wade is a 66 y.o. female with a history of chronic pain, CHF and COPD who presents to the ED for complaints including diarrhea, abdominal pain, possible UTI, incontinence and shortness of breath.  Patient is also having pain above and below her left breast.  EMS had expressed significant concerns about her living situation.  She is morbidly obese and could not get up today.  Typically she has a home health nurse that comes to check on her.  She has a history of COPD and is on 2 L of oxygen all the time.  Past Medical History:  Diagnosis Date  . Anemia   . CHF (congestive heart failure) ([place])   . Chronic back pain   . COPD (chronic obstructive pulmonary disease) ([facility])    on 2L home o2  . Edema   . Esophageal reflux   . Fatigue   . Fibromyalgia    Following with pain management  . Hypertension   . Nocturia   . OA (osteoarthritis)   . Sleep apnea    not on CPAP    Patient Active Problem List   Diagnosis Date Noted  . Candidal intertrigo 02/08/2016  . Dysuria 02/08/2016  . Requires assistance with activities of daily living (ADL) 02/08/2016  . Oral thrush 11/10/2015  . Tachypnea 11/01/2015  . Acute non-recurrent maxillary sinusitis 10/29/2015  . Fall 07/11/2015  . Dyslipidemia 06/17/2015  . Overflow stress incontinence of urine in female 06/17/2015  . Chronic pain 06/10/2015  . Chronic low back pain (Location of Tertiary source of pain) (Bilateral) (R>L) 06/10/2015  . Chronic shoulder pain (Location of Secondary source of pain) (Left) 06/10/2015  . Costochondritis (Location of Primary Source of Pain) 06/10/2015  . Long term current use  of opiate analgesic 06/10/2015  . Long term prescription opiate use 06/10/2015  . Opiate use 06/10/2015  . Encounter for therapeutic drug level monitoring 06/10/2015  . Encounter for pain management planning 06/10/2015  . Chronic feet pain (Bilateral) (R>L) 06/10/2015  . Osteoarthritis 06/10/2015  . Osteoarthritis of shoulder (Left) 06/10/2015  . COPD exacerbation ([place]) 04/12/2015  . Poor mobility 04/12/2015  . Fibromyalgia 03/23/2015  . GERD (gastroesophageal reflux disease) 03/23/2015  . MRSA infection 11/17/2014  . Chronic diastolic CHF (congestive heart failure) ([place]) 11/12/2014  . Morbid obesity with BMI of 50.0-59.9, adult ([place]) 11/12/2014  . Neck pain, chronic 11/12/2014  . COPD (chronic obstructive pulmonary disease) ([place]) 11/04/2014  . Urinary tract infection 10/02/2014    Past Surgical History:  Procedure Laterality Date  . ABDOMINAL HYSTERECTOMY    . CHOLECYSTECTOMY    . TOTAL KNEE ARTHROPLASTY     right  . VESICOVAGINAL FISTULA CLOSURE W/ TAH      Allergies Gabapentin  Social History Social History   Tobacco Use  . Smoking status: Former Smoker    Last attempt to quit: 11/04/1983    Years since quitting: 33.2  . Smokeless tobacco: Never Used  . Tobacco comment: quit several years ago - almost 30 years ago  Substance Use Topics  . Alcohol use: No    Alcohol/week: 0.0 oz  . Drug use: No    Review of Systems Constitutional: Negative for fever. Eyes: Negative  for vision changes ENT:  Negative for congestion, sore throat Cardiovascular: positive for chest pain Respiratory: Positive for shortness of breath Gastrointestinal: Positive for abdominal pain and diarrhea Genitourinary: Positive for dysuria Musculoskeletal: Negative for back pain. Skin: Negative for rash. Neurological: Positive for weakness  All systems negative/normal/unremarkable except as stated in the HPI  ____________________________________________   PHYSICAL EXAM:  VITAL  SIGNS: ED Triage Vitals  Enc Vitals Group     BP      Pulse      Resp      Temp      Temp src      SpO2      Weight      Height      Head Circumference      Peak Flow      Pain Score      Pain Loc      Pain Edu?      Excl. in [place]?     Constitutional: Alert and oriented.  Mild distress, super morbid obesity Eyes: Conjunctivae are normal. Normal extraocular movements. ENT   Head: Normocephalic and atraumatic.   Nose: No congestion/rhinnorhea.   Mouth/Throat: Mucous membranes are moist.   Neck: No stridor. Cardiovascular: Normal rate, regular rhythm. No murmurs, rubs, or gallops. Respiratory: Mild rhonchi, mild tachypnea Gastrointestinal: Soft and nontender. Normal bowel sounds Musculoskeletal: Nontender with normal range of motion in extremities.  Bilateral edema is noted, left chest wall tenderness is noted.  Neurologic:  Normal speech and language. No gross focal neurologic deficits are appreciated.  Skin:  Skin is warm, dry and intact. No rash noted. Psychiatric: Mood and affect are normal. Speech and behavior are normal.  ____________________________________________  EKG: Interpreted by me.  Sinus rhythm the rate of 58 bpm, normal PR interval, normal QRS size, normal QT.  ____________________________________________  ED COURSE:  Pertinent labs & imaging results that were available during my care of the patient were reviewed by me and considered in my medical decision making (see chart for details). Patient presents for chest pain, weakness, diarrhea, we will assess with labs and imaging as indicated.   Procedures ____________________________________________   LABS (pertinent positives/negatives)  Labs Reviewed  CBC WITH DIFFERENTIAL/PLATELET - Abnormal; Notable for the following components:      Result Value   RBC 3.79 (*)    Hemoglobin 10.8 (*)    MCHC 30.7 (*)    RDW 17.0 (*)    All other components within normal limits  COMPREHENSIVE METABOLIC  PANEL - Abnormal; Notable for the following components:   Chloride 94 (*)    CO2 35 (*)    Glucose, Bld 113 (*)    Albumin 3.4 (*)    All other components within normal limits  URINALYSIS, COMPLETE (UACMP) WITH MICROSCOPIC - Abnormal; Notable for the following components:   Color, Urine AMBER (*)    APPearance CLOUDY (*)    Hgb urine dipstick SMALL (*)    Protein, ur 30 (*)    Nitrite POSITIVE (*)    Leukocytes, UA LARGE (*)    Bacteria, UA MANY (*)    Squamous Epithelial / LPF 0-5 (*)    All other components within normal limits  BLOOD GAS, VENOUS - Abnormal; Notable for the following components:   pCO2, Ven 76 (*)    Bicarbonate 45.0 (*)    Acid-Base Excess 16.8 (*)    All other components within normal limits  URINE CULTURE  AMMONIA  TROPONIN I  CBG MONITORING, ED   CRITICAL  CARE Performed by: Earleen Newport   Total critical care time: 30 minutes  Critical care time was exclusive of separately billable procedures and treating other patients.  Critical care was necessary to treat or prevent imminent or life-threatening deterioration.  Critical care was time spent personally by me on the following activities: development of treatment plan with patient and/or surrogate as well as nursing, discussions with consultants, evaluation of patient's response to treatment, examination of patient, obtaining history from patient or surrogate, ordering and performing treatments and interventions, ordering and review of laboratory studies, ordering and review of radiographic studies, pulse oximetry and re-evaluation of patient's condition.  RADIOLOGY Images were viewed by me  Chest x-ray  IMPRESSION: Probable congestive heart failure with venous hypertension and interstitial edema. ____________________________________________  DIFFERENTIAL DIAGNOSIS   Dehydration, anemia, pneumonia, electrolyte abnormality, chronic pain, general debility, UTI  FINAL ASSESSMENT AND  PLAN  Weakness, UTI, hypercarbia, CHF   Plan: Patient had presented for weakness and multiple other complaints. Patient's labs did reveal UTI as well as likely acute on chronic hypercarbia.  She was started on duo nebs and steroids.. Patient's imaging also revealed some congestive heart failure.  She does require some supplemental oxygen.  I have ordered antibiotics for her UTI.  I will discussed with the hospitalist for admission.   Earleen Newport, MD   Note: This note was generated in part or whole with voice recognition software. Voice recognition is usually quite accurate but there are transcription errors that can and very often do occur. I apologize for any typographical errors that were not detected and corrected.     Earleen Newport, MD 01/18/17 1257

## 2017-01-18 NOTE — ED Triage Notes (Signed)
Patient presents to ED via [facility] from home with multiple complaints including diarrhea, abdominal pain, uti, incontinence and shortness of breath. Patient is A&O x4. EMS expressed concerns regarding patients living situation. Patient was found in a pool of urine. Patient states she is not able to care for herself at home. Hx of COPD. Patient wears 2L of O2 all the time.

## 2017-01-18 NOTE — Plan of Care (Signed)
  Progressing Activity: Ability to implement measures to reduce episodes of fatigue will improve 01/18/2017 1529 - Progressing by Darrelyn Hillock, RN Ability to tolerate increased activity will improve 01/18/2017 1529 - Progressing by Darrelyn Hillock, RN Will verbalize the importance of balancing activity with adequate rest periods 01/18/2017 1529 - Progressing by Darrelyn Hillock, RN Education: Knowledge of disease or condition will improve 01/18/2017 1529 - Progressing by Darrelyn Hillock, RN Knowledge of the prescribed therapeutic regimen will improve 01/18/2017 1529 - Progressing by Darrelyn Hillock, RN Coping: Ability to identify and develop effective coping behavior will improve 01/18/2017 1529 - Progressing by Darrelyn Hillock, RN Level of anxiety will decrease 01/18/2017 1529 - Progressing by Darrelyn Hillock, RN Health Behavior/Discharge Planning: Ability to manage health-related needs will improve 01/18/2017 1529 - Progressing by Darrelyn Hillock, RN Nutritional: Maintenance of adequate nutrition will improve 01/18/2017 1529 - Progressing by Darrelyn Hillock, RN Respiratory: Ability to maintain a clear airway will improve 01/18/2017 1529 - Progressing by Darrelyn Hillock, RN Levels of oxygenation will improve 01/18/2017 1529 - Progressing by Darrelyn Hillock, RN Ability to maintain adequate ventilation will improve 01/18/2017 1529 - Progressing by Darrelyn Hillock, RN Complications related to the disease process, condition or treatment will be avoided or minimized 01/18/2017 1529 - Progressing by Darrelyn Hillock, RN Pain Management: Expressions of feelings of enhanced comfort will increase 01/18/2017 1529 - Progressing by Darrelyn Hillock, RN Education: Ability to demonstrate management of disease process will improve 01/18/2017 1529 - Progressing by  Darrelyn Hillock, RN Ability to verbalize understanding of medication therapies will improve 01/18/2017 1529 - Progressing by Darrelyn Hillock, RN Activity: Capacity to carry out activities will improve 01/18/2017 1529 - Progressing by Darrelyn Hillock, RN Cardiac: Ability to achieve and maintain adequate cardiopulmonary perfusion will improve 01/18/2017 1529 - Progressing by Darrelyn Hillock, RN

## 2017-01-18 NOTE — ED Notes (Signed)
Called pharmacy to verify compatibility of rocephin and Levofloxacin before running. They are compatible.

## 2017-01-18 NOTE — ED Notes (Signed)
Pt placed on bedpan

## 2017-01-18 NOTE — ED Notes (Signed)
Patient transported to radiology

## 2017-01-18 NOTE — ED Notes (Signed)
Loretta Wade  [phone]

## 2017-01-18 NOTE — H&P (Signed)
Loretta Wade NAME: Loretta Wade    MR#:  [id number]  DATE OF BIRTH:  07-31-1950  DATE OF ADMISSION:  01/18/2017  PRIMARY CARE PHYSICIAN: Clovia Cuff, MD   REQUESTING/REFERRING PHYSICIAN: Dr. Jimmye Norman  CHIEF COMPLAINT:  Generalized weakness, shortness of breath, diarrhea for 2-3 days  HISTORY OF PRESENT ILLNESS:  Loretta Wade  is a 66 y.o. female with a known history of morbid obesity, chronic respiratory failure secondary to CHF and COPD on 2 L nasal cannula oxygen, chronic hypercarbia, sleep apnea not on CPAP comes to the emergency room with increasing weakness secondary to diarrhea.  Patient said about a week ago she had constipation.  She does not remember taking anything for treatment of it started having a runny stools.  She was unable to manage herself at home continued to feel generalized weakness came to the emergency room with increasing shortness of breath and was found to have mild congestive heart failure//vascular congestion She was also found to have UTI. Patient is being admitted for further eval check management. Her BNP was 73.  Chest x-ray showed fluid in the lingula right and mild vascular congestion.  She received 40 of Lasix in the ER. PAST MEDICAL HISTORY:   Past Medical History:  Diagnosis Date  . Anemia   . CHF (congestive heart failure) ([place])   . Chronic back pain   . COPD (chronic obstructive pulmonary disease) ([facility])    on 2L home o2  . Edema   . Esophageal reflux   . Fatigue   . Fibromyalgia    Following with pain management  . Hypertension   . Nocturia   . OA (osteoarthritis)   . Sleep apnea    not on CPAP    PAST SURGICAL HISTOIRY:   Past Surgical History:  Procedure Laterality Date  . ABDOMINAL HYSTERECTOMY    . CHOLECYSTECTOMY    . TOTAL KNEE ARTHROPLASTY     right  . VESICOVAGINAL FISTULA CLOSURE W/ TAH      SOCIAL HISTORY:   Social History   Tobacco Use  .  Smoking status: Former Smoker    Last attempt to quit: 11/04/1983    Years since quitting: 33.2  . Smokeless tobacco: Never Used  . Tobacco comment: quit several years ago - almost 30 years ago  Substance Use Topics  . Alcohol use: No    Alcohol/week: 0.0 oz    FAMILY HISTORY:   Family History  Problem Relation Age of Onset  . Hypertension Mother   . Diabetes Unknown   . Breast cancer Daughter     DRUG ALLERGIES:   Allergies  Allergen Reactions  . Gabapentin     Pt reports dizziness and abnormal urine labs.  . Tramadol Nausea And Vomiting    REVIEW OF SYSTEMS:  Review of Systems  Constitutional: Negative for chills, fever and weight loss.  HENT: Negative for ear discharge, ear pain and nosebleeds.   Eyes: Negative for blurred vision, pain and discharge.  Respiratory: Positive for shortness of breath. Negative for sputum production, wheezing and stridor.   Cardiovascular: Negative for chest pain, palpitations, orthopnea and PND.  Gastrointestinal: Positive for diarrhea. Negative for abdominal pain, nausea and vomiting.  Genitourinary: Positive for dysuria. Negative for frequency and urgency.  Musculoskeletal: Positive for back pain. Negative for joint pain.  Neurological: Positive for weakness. Negative for sensory change, speech change and focal weakness.  Psychiatric/Behavioral: Negative for depression and hallucinations. The patient is  not nervous/anxious.      MEDICATIONS AT HOME:   Prior to Admission medications   Medication Sig Start Date End Date Taking? Authorizing Provider  albuterol (PROVENTIL) (2.5 MG/3ML) 0.083% nebulizer solution USE ONE TREATMENT EVERY 6 HOURS AS NEEDED FOR WHEEZING OR SHORTNESS OF BREATH 05/16/16  Yes Roselee Nova, MD  cholecalciferol (VITAMIN D) 1000 UNITS tablet Take 1,000 Units by mouth daily.    Yes [provider]  citalopram (CELEXA) 20 MG tablet TAKE ONE (1) TABLET BY MOUTH EVERY DAY 05/16/16  Yes Roselee Nova, MD   colesevelam [facility]) 625 MG tablet TAKE ONE TABLET TWICE A DAY WITH FOOD 05/16/16  Yes Roselee Nova, MD  cyanocobalamin 500 MCG tablet Take 500 mcg by mouth daily.   Yes [provider]  dexlansoprazole (DEXILANT) 60 MG capsule TAKE ONE (1) CAPSULE EACH DAY 05/16/16  Yes Roselee Nova, MD  furosemide (LASIX) 40 MG tablet Take 1 tablet (40 mg total) by mouth 2 (two) times daily. 02/08/15  Yes Dustin Flock, MD  Magnesium 250 MG TABS Take 250 mg by mouth daily.   Yes [provider]  nystatin (MYCOSTATIN) 100000 UNIT/ML suspension Take 5 mLs (500,000 Units total) by mouth 4 (four) times daily. 11/10/15  Yes Roselee Nova, MD  oxybutynin (DITROPAN) 5 MG tablet TAKE ONE (1) TABLET BY MOUTH EVERY DAY Patient taking differently: 5 mg 2 (two) times daily.  05/16/16  Yes Roselee Nova, MD  predniSONE (STERAPRED UNI-PAK 21 TAB) 10 MG (21) TBPK tablet Take 6 tabs first day, 5 tab on day 2, then 4 on day 3rd, 3 tabs on day 4th , 2 tab on day 5th, and 1 tab on 6th day. 11/28/16  Yes Vaughan Basta, MD  pregabalin (LYRICA) 150 MG capsule Take 1 capsule (150 mg total) by mouth 2 (two) times daily. 05/16/16  Yes Roselee Nova, MD  rOPINIRole (REQUIP) 0.25 MG tablet Take 3 tablets (0.75 mg total) by mouth at bedtime. 05/16/16  Yes Roselee Nova, MD  acetaminophen (TYLENOL) 325 MG tablet Take 2 tablets (650 mg total) by mouth every 6 (six) hours as needed for mild pain (or Fever >/= 101). 11/17/14   Gladstone Lighter, MD  ANORO ELLIPTA 62.5-25 MCG/INH AEPB Inhale 1 puff into the lungs daily. 10/06/15   [provider]  chlorpheniramine-HYDROcodone (TUSSIONEX) 10-8 MG/5ML SUER Take 5 mLs by mouth every 12 (twelve) hours as needed for cough. 10/11/16   Theodoro Grist, MD  fluticasone (FLONASE) 50 MCG/ACT nasal spray USE TWO SPRAYS IN EACH NOSTRIL EVERY DAY 07/06/16   Ancil Boozer, Drue Stager, MD  guaiFENesin (MUCINEX) 600 MG 12 hr tablet Take 1 tablet (600 mg total) by mouth 2  (two) times daily. 10/11/16   Theodoro Grist, MD  ipratropium-albuterol (DUONEB) 0.5-2.5 (3) MG/3ML SOLN Take 3 mLs by nebulization every 6 (six) hours as needed. 11/28/16   Vaughan Basta, MD  loperamide (IMODIUM) 2 MG capsule Take 2-4 mg by mouth as needed for diarrhea or loose stools.    [provider]  mouth rinse LIQD solution 15 mLs by Mouth Rinse route 2 (two) times daily. Patient not taking: Reported on 01/18/2017 10/11/16   Theodoro Grist, MD      VITAL SIGNS:  Blood pressure (!) 150/57, pulse (!) 57, temperature 98.1 F (36.7 C), temperature source Oral, resp. rate 19, height 5\' 4"  (1.626 m), weight (!) 181.4 kg (400 lb), SpO2 96 %.  PHYSICAL EXAMINATION:  GENERAL:  66 y.o.-year-old patient lying in the bed with no acute distress. Morbidly obese EYES: Pupils equal, round, reactive to light and accommodation. No scleral icterus. Extraocular muscles intact.  HEENT: Head atraumatic, normocephalic. Oropharynx and nasopharynx clear.  NECK:  Supple, no jugular venous distention. No thyroid enlargement, no tenderness.  LUNGS: Normal breath sounds bilaterally, no wheezing, rales,rhonchi or crepitation. No use of accessory muscles of respiration. Decreased bs bases CARDIOVASCULAR: S1, S2 normal. No murmurs, rubs, or gallops.  ABDOMEN: Soft, nontender, nondistended. Bowel sounds present. No organomegaly or mass. obese EXTREMITIES: ++ pedal edema, no cyanosis, or clubbing.  NEUROLOGIC: Cranial nerves II through XII are intact. Muscle strength 5/5 in all extremities. Sensation intact. Gait not checked.  PSYCHIATRIC: The patient is alert and oriented x 3.  SKIN: No obvious rash, lesion, or ulcer.   LABORATORY PANEL:   CBC Recent Labs  Lab 01/18/17 1142  WBC 6.1  HGB 10.8*  HCT 35.2  PLT 196   ------------------------------------------------------------------------------------------------------------------  Chemistries  Recent Labs  Lab 01/18/17 1142  NA 140  K 3.9   CL 94*  CO2 35*  GLUCOSE 113*  BUN 14  CREATININE 0.75  CALCIUM 9.2  AST 18  ALT 19  ALKPHOS 83  BILITOT 0.9   ------------------------------------------------------------------------------------------------------------------  Cardiac Enzymes Recent Labs  Lab 01/18/17 1142  TROPONINI <0.03   ------------------------------------------------------------------------------------------------------------------  RADIOLOGY:  Dg Chest 2 View  Result Date: 01/18/2017 CLINICAL DATA:  Shortness of breath EXAM: CHEST  2 VIEW COMPARISON:  11/24/2016 and previous FINDINGS: Limited by patient's size. Probable venous hypertension and mild interstitial edema. Question small effusions. Upper lungs are clear. IMPRESSION: Probable congestive heart failure with venous hypertension and interstitial edema. Electronically Signed   By: Nelson Chimes M.D.   On: 01/18/2017 12:36    EKG:    IMPRESSION AND PLAN:   Loretta Wade  is a 66 y.o. female with a known history of morbid obesity, chronic respiratory failure secondary to CHF and COPD on 2 L nasal cannula oxygen, chronic hypercarbia, sleep apnea not on CPAP comes to the emergency room with increasing weakness secondary to diarrhea.  Patient said about a week ago she had constipation.  She does not remember taking anything for treatment of it started having a runny stools.  She was unable to manage herself at home continued to feel generalized weakness came to the emergency room   1.  Increasing shortness of breath with bilateral lower extremity pedal edema -Acute on chronic diastolic congestive heart failure mild -IV Lasix 20 mg twice daily--- change to oral tomorrow if patient has good urine output -Continue oxygen  2.  UTI -IV Rocephin  3.  Diarrhea -Appears patient had constipation a week earlier now getting diarrhea -Afebrile white count stable -PRN Lomotil  4.  Hypertension continue home meds  5.  Chronic hypercarbic respiratory  failure secondary to COPD/suspected obesity hypoventilation syndrome/history of sleep apnea not on CPAP -Patient has chronic hypercarbia continue to monitor.  Her mental status is stable.  Continue oxygenation -Nebs as needed  6.  Physical therapy For generalized weakness deconditioning  Worker Case management for discharge planning  All the records are reviewed and case discussed with ED provider. Management plans discussed with the patient, family and they are in agreement.  CODE STATUS:full  TOTAL TIME TAKING CARE OF THIS PATIENT: *50* minutes.    Fritzi Mandes M.D on 01/18/2017 at 4:06 PM  Between 7am to 6pm - Pager - [phone]  After 6pm go to [email or web address] -  password [email or web address] [facility]  [facility]  Office  [phone]  CC: Primary care physician; Clovia Cuff, MD

## 2017-01-18 NOTE — ED Notes (Signed)
Patient is congested. States she is unable to breath out of her nose. SpO2 85-90% on 4L nasal cannula. Patient placed on 5L but cannula is placed orally. Patient educated to keep her lips closed around cannula. SpO2 up to 94%. MD notified and aware.

## 2017-01-18 NOTE — ED Notes (Signed)
Cannula back in patients nostrils at this time. SpO2 98%.

## 2017-01-19 LAB — BASIC METABOLIC PANEL
ANION GAP: 10 (ref 5–15)
BUN: 15 mg/dL (ref 6–20)
CALCIUM: 8.9 mg/dL (ref 8.9–10.3)
CO2: 38 mmol/L — AB (ref 22–32)
Chloride: 93 mmol/L — ABNORMAL LOW (ref 101–111)
Creatinine, Ser: 0.72 mg/dL (ref 0.44–1.00)
GFR calc non Af Amer: >60 mL/min (ref >60)
Glucose, Bld: 110 mg/dL — ABNORMAL HIGH (ref 65–99)
Potassium: 3.9 mmol/L (ref 3.5–5.1)
SODIUM: 141 mmol/L (ref 135–145)

## 2017-01-19 LAB — URINE CULTURE: SPECIAL REQUESTS: NORMAL

## 2017-01-19 MED ORDER — ENOXAPARIN SODIUM 40 MG/0.4ML ~~LOC~~ SOLN
40.0000 mg | Freq: Two times a day (BID) | SUBCUTANEOUS | Status: DC
  Administered 2017-01-19 – 2017-01-28 (×19): 40 mg via SUBCUTANEOUS
  Filled 2017-01-19 (×19): qty 0.4

## 2017-01-19 MED ORDER — HYDROCORTISONE 1 % EX CREA
TOPICAL_CREAM | Freq: Three times a day (TID) | CUTANEOUS | Status: DC | PRN
  Filled 2017-01-19: qty 28

## 2017-01-19 MED ORDER — OXYCODONE HCL 5 MG PO TABS: 5.0000 mg | ORAL_TABLET | Freq: Three times a day (TID) | ORAL | Status: DC | PRN

## 2017-01-19 MED ORDER — MAGIC MOUTHWASH
5.0000 mL | Freq: Three times a day (TID) | ORAL | Status: DC
  Administered 2017-01-19 – 2017-01-28 (×24): 5 mL via ORAL
  Filled 2017-01-19 (×27): qty 5

## 2017-01-19 MED ORDER — WITCH HAZEL-GLYCERIN EX PADS
MEDICATED_PAD | CUTANEOUS | Status: DC | PRN
  Administered 2017-01-19: 02:00:00 via TOPICAL
  Filled 2017-01-19: qty 100

## 2017-01-19 MED ORDER — WITCH HAZEL-GLYCERIN EX PADS
MEDICATED_PAD | CUTANEOUS | Status: DC | PRN
  Filled 2017-01-19: qty 100

## 2017-01-19 MED ORDER — HYDROCODONE-ACETAMINOPHEN 5-325 MG PO TABS
1.0000 | ORAL_TABLET | Freq: Four times a day (QID) | ORAL | Status: DC | PRN
  Administered 2017-01-19 – 2017-01-28 (×30): 1 via ORAL
  Filled 2017-01-19 (×31): qty 1

## 2017-01-19 NOTE — Plan of Care (Signed)
Patient has complained of anal pain multiple times this shift. Assessed rectum did not see any protruding hemorrhoids. Applied Tucks for comfort. Administered pain medication times two with no relief. Patient has been placed on a bariatric, rotating bed. Patient appears to be more comfortable. Patient has been resting in between care.

## 2017-01-19 NOTE — Progress Notes (Signed)
[name] [facility] [place] NAME: Loretta Wade    MR#:  [id number]  DATE OF BIRTH:  18-Oct-1950  SUBJECTIVE:  CHIEF COMPLAINT:   Chief Complaint  Patient presents with  . Failure To Thrive   Came SOB, have UTI. REVIEW OF SYSTEMS:   Constitutional: Negative for chills, fever and weight loss.  HENT: Negative for ear discharge, ear pain and nosebleeds.   Eyes: Negative for blurred vision, pain and discharge.  Respiratory: Positive for shortness of breath. Negative for sputum production, wheezing and stridor.   Cardiovascular: Negative for chest pain, palpitations, orthopnea and PND.  Gastrointestinal: Positive for diarrhea. Negative for abdominal pain, nausea and vomiting.  Genitourinary: Positive for dysuria. Negative for frequency and urgency.  Musculoskeletal: Positive for back pain. Negative for joint pain.  Neurological: Positive for weakness. Negative for sensory change, speech change and focal weakness.  Psychiatric/Behavioral: Negative for depression and hallucinations. The patient is not nervous/anxious.      ROS  DRUG ALLERGIES:   Allergies  Allergen Reactions  . Gabapentin     Pt reports dizziness and abnormal urine labs.  . Tramadol Nausea And Vomiting    VITALS:  Blood pressure (!) 150/56, pulse 63, temperature 98.1 F (36.7 C), temperature source Oral, resp. rate 16, height 5\' 4"  (1.626 m), weight (!) 166.9 kg (368 lb), SpO2 100 %.  PHYSICAL EXAMINATION:   GENERAL:  66 y.o.-year-old patient lying in the bed with no acute distress. Morbidly obese EYES: Pupils equal, round, reactive to light and accommodation. No scleral icterus. Extraocular muscles intact.  HEENT: Head atraumatic, normocephalic. Oropharynx and nasopharynx clear.  NECK:  Supple, no jugular venous distention. No thyroid enlargement, no tenderness.  LUNGS: Normal breath sounds bilaterally, no wheezing, rales,rhonchi or crepitation. No use of accessory  muscles of respiration. Decreased bs bases CARDIOVASCULAR: S1, S2 normal. No murmurs, rubs, or gallops.  ABDOMEN: Soft, nontender, nondistended. Bowel sounds present. No organomegaly or mass. obese EXTREMITIES: ++ pedal edema, no cyanosis, or clubbing.  NEUROLOGIC: Cranial nerves II through XII are intact. Muscle strength 5/5 in all extremities. Sensation intact. Gait not checked.  PSYCHIATRIC: The patient is alert and oriented x 3.  SKIN: No obvious rash, lesion, or ulcer.    Physical Exam LABORATORY PANEL:   CBC Recent Labs  Lab 01/18/17 1142  WBC 6.1  HGB 10.8*  HCT 35.2  PLT 196   ------------------------------------------------------------------------------------------------------------------  Chemistries  Recent Labs  Lab 01/18/17 1142 01/19/17 0448  NA 140 141  K 3.9 3.9  CL 94* 93*  CO2 35* 38*  GLUCOSE 113* 110*  BUN 14 15  CREATININE 0.75 0.72  CALCIUM 9.2 8.9  AST 18  --   ALT 19  --   ALKPHOS 83  --   BILITOT 0.9  --    ------------------------------------------------------------------------------------------------------------------  Cardiac Enzymes Recent Labs  Lab 01/18/17 1142  TROPONINI <0.03   ------------------------------------------------------------------------------------------------------------------  RADIOLOGY:  Dg Chest 2 View  Result Date: 01/18/2017 CLINICAL DATA:  Shortness of breath EXAM: CHEST  2 VIEW COMPARISON:  11/24/2016 and previous FINDINGS: Limited by patient's size. Probable venous hypertension and mild interstitial edema. Question small effusions. Upper lungs are clear. IMPRESSION: Probable congestive heart failure with venous hypertension and interstitial edema. Electronically Signed   By: Nelson Chimes M.D.   On: 01/18/2017 12:36    ASSESSMENT AND PLAN:   Active Problems:   CHF (congestive heart failure) ([facility])  Loretta Wade  is a 66 y.o. female with a known  history of morbid obesity, chronic respiratory failure  secondary to CHF and COPD on 2 L nasal cannula oxygen, chronic hypercarbia, sleep apnea not on CPAP comes to the emergency room with increasing weakness secondary to diarrhea.  Patient said about a week ago she had constipation.  She does not remember taking anything for treatment of it started having a runny stools.  She was unable to manage herself at home continued to feel generalized weakness came to the emergency room   1.  Increasing shortness of breath with bilateral lower extremity pedal edema -Acute on chronic diastolic congestive heart failure mild -IV Lasix 20 mg twice daily--- change to oral tomorrow if patient has good urine output -Continue oxygen  2.  UTI -IV Rocephin  3.  Diarrhea -Appears patient had constipation a week earlier now getting diarrhea -Afebrile white count stable -PRN Lomotil  4.  Hypertension continue home meds  5.  Chronic hypercarbic respiratory failure secondary to COPD/suspected obesity hypoventilation syndrome/history of sleep apnea not on CPAP -Patient has chronic hypercarbia continue to monitor.  Her mental status is stable.  Continue oxygenation -Nebs as needed  6.  Physical therapy For generalized weakness deconditioning     All the records are reviewed and case discussed with Care Management/Social Workerr. Management plans discussed with the patient, family and they are in agreement.  CODE STATUS: Full.  TOTAL TIME TAKING CARE OF THIS PATIENT: 35 minutes.     POSSIBLE D/C IN 1-2 DAYS, DEPENDING ON CLINICAL CONDITION.   Vaughan Basta M.D on 01/19/2017   Between 7am to 6pm - Pager - [phone]  After 6pm go to [email or web address] - password [email or web address] [place] [facility]  Office  [phone]  CC: Primary care physician; Clovia Cuff, MD  Note: This dictation was prepared with Dragon dictation along with smaller phrase technology. Any transcriptional errors that result from this process are  unintentional.

## 2017-01-19 NOTE — Evaluation (Signed)
Physical Therapy Evaluation Patient Details Name: Loretta Wade MRN: [id number] DOB: 09/25/1950 Today's Date: 01/19/2017   History of Present Illness  Pt is a 66 y.o. female presenting to hospital with diarrhea, abdominal pain, possible UTI, incontinence, and SOB.  Pt admitted with acute on chronic diastolic CHF, UTI, diarrhea, htn, chronic hypercarbic respiratory failure secondary to COPD/suspected obesity hypoventilation.  PMH includes COPD (on 2 L home O2), CHF, chronic back pain, fibromyalgia, R TKA.  Clinical Impression  Prior to hospital admission, pt was ambulating very short distances (d/t SOB) and otherwise propelling self in manual w/c.  Pt lives alone in 1 level home with level entry.  Pt reports that she does not have much help at home.  Nursing came to give pt pain meds and elevated head of bed for pt to swallow pills.  After nursing left, pt c/o significant pain in her "bottom" and needed the bed to lay back down again to get comfortable and then pt assisted into sidelying with pillow support to improve comfort.  Currently pt is mod to max assist logrolling to L in bed.  Pt declined attempting to sit on edge of bed or bed level ex's d/t pain in her "bottom".  Overall pt appearing with generalized weakness and SOB with minimal activity performed in bed.  Pt would benefit from skilled PT to address noted impairments and functional limitations (see below for any additional details).  Upon hospital discharge, currently recommend pt discharge to STR (d/t noted generalized weakness and recent increased difficulty with functional mobility at home) but therapy will update discharge recommendations as appropriate as further mobility is assessed during hospitalization.    Follow Up Recommendations SNF    Equipment Recommendations  Rolling walker with 5" wheels;Wheelchair (measurements PT)(pt has manual w/c at home)    Recommendations for Other Services       Precautions / Restrictions  Precautions Precautions: Fall Restrictions Weight Bearing Restrictions: No      Mobility  Bed Mobility Overal bed mobility: Needs Assistance Bed Mobility: Rolling Rolling: Mod assist;Max assist         General bed mobility comments: pt able to initiate logroll with use of bed rail logrolling to L but required assist of therapist to finish logroll  Transfers                 General transfer comment: Pt declined OOB d/t significant pain in her bottom when sitting more upright in bed (less painful laying flatter in bed)  Ambulation/Gait             General Gait Details: Pt declined OOB d/t significant pain in her bottom when sitting more upright in bed (less painful laying flatter in bed)  Stairs            Wheelchair Mobility    Modified Rankin (Stroke Patients Only)       Balance                                             Pertinent Vitals/Pain Pain Assessment: 0-10 Pain Score: 8  Pain Descriptors / Indicators: Sore;Tender;Constant Pain Intervention(s): Limited activity within patient's tolerance;Monitored during session;Repositioned;Patient requesting pain meds-RN notified  Vitals (HR and O2 on 5 L via nasal cannula) stable and WFL throughout treatment session.    Home Living Family/patient expects to be discharged to:: Private residence Living Arrangements: Alone  Available Help at Discharge: (pt reports no friends/family available to assist) Type of Home: Apartment Home Access: Level entry     Home Layout: One level Home Equipment: Wheelchair - manual;Hospital bed;Grab bars - tub/shower(Pt's 4ww is broken)      Prior Function Level of Independence: Needs assistance   Gait / Transfers Assistance Needed: Pt denies any recent falls.  Pt was ambulating up to ~10 ft limited by SOB. Pt uses walker to ambulate in her house but using WC at times due to SOB.  Pt able to self propel WC with BUEs and BLEs.    ADL's / Homemaking  Assistance Needed: Pt independent with dressing but it has been difficult. Pt has minimal assist from family for sponge bathing.   Family assists some with cooking,otherwise pt eating microwave dinners.  Family does the driving to medical appointments if needed (pt reports MD has been coming to her home).  Comments: Baseline 2 L home O2 use.     Hand Dominance   Dominant Hand: Right    Extremity/Trunk Assessment   Upper Extremity Assessment Upper Extremity Assessment: Generalized weakness    Lower Extremity Assessment Lower Extremity Assessment: Generalized weakness       Communication   Communication: No difficulties  Cognition Arousal/Alertness: Awake/alert Behavior During Therapy: WFL for tasks assessed/performed Overall Cognitive Status: Within Functional Limits for tasks assessed                                        General Comments General comments (skin integrity, edema, etc.): Pt in bariatric rotating bed.  External catheter in place.  Nursing cleared pt for participation in physical therapy.  Pt agreeable to limited PT session.    Exercises     Assessment/Plan    PT Assessment Patient needs continued PT services  PT Problem List Decreased strength;Decreased activity tolerance;Decreased mobility;Cardiopulmonary status limiting activity;Pain;Obesity       PT Treatment Interventions DME instruction;Gait training;Functional mobility training;Therapeutic activities;Therapeutic exercise;Balance training;Patient/family education    PT Goals (Current goals can be found in the Care Plan section)  Acute Rehab PT Goals Patient Stated Goal: to have less pain PT Goal Formulation: With patient Time For Goal Achievement: 02/02/17 Potential to Achieve Goals: Fair    Frequency Min 2X/week   Barriers to discharge Decreased caregiver support      Co-evaluation               AM-PAC PT "6 Clicks" Daily Activity  Outcome Measure Difficulty  turning over in bed (including adjusting bedclothes, sheets and blankets)?: Unable Difficulty moving from lying on back to sitting on the side of the bed? : Unable Difficulty sitting down on and standing up from a chair with arms (e.g., wheelchair, bedside commode, etc,.)?: Unable Help needed moving to and from a bed to chair (including a wheelchair)?: A Lot Help needed walking in hospital room?: Total Help needed climbing 3-5 steps with a railing? : Total 6 Click Score: 7    End of Session Equipment Utilized During Treatment: Oxygen(5 L O2 via nasal cannula) Activity Tolerance: Patient limited by pain Patient left: in bed;with call bell/phone within reach Nurse Communication: Mobility status;Precautions;Patient requests pain meds PT Visit Diagnosis: Other abnormalities of gait and mobility (R26.89);Muscle weakness (generalized) (M62.81)    Time: 1125-1150 PT Time Calculation (min) (ACUTE ONLY): 25 min   Charges:   PT Evaluation $PT Eval Low Complexity: 1  Low     PT G Codes:   PT G-Codes **NOT FOR INPATIENT CLASS** Functional Assessment Tool Used: AM-PAC 6 Clicks Basic Mobility Functional Limitation: Mobility: Walking and moving around Mobility: Walking and Moving Around Current Status (C5852): At least 80 percent but less than 100 percent impaired, limited or restricted Mobility: Walking and Moving Around Goal Status [phone]): At least 20 percent but less than 40 percent impaired, limited or restricted    Leitha Bleak, PT 01/19/17, 12:16 PM [phone]

## 2017-01-19 NOTE — Evaluation (Signed)
Occupational Therapy Evaluation Patient Details Name: Loretta Wade MRN: [id number] DOB: 15-Jul-1950 Today's Date: 01/19/2017    History of Present Illness Pt is a 66 y.o. female who was admitted to the [facility] with diarrhea, abdominal pain, possible UTI, incontinence, and SOB.  Pt  has acute on chronic diastolic CHF, UTI, diarrhea, htn, chronic hypercarbic respiratory failure secondary to COPD/suspected obesity hypoventilation,  Pt. PMHx includes: COPD (on 2L home O2), CHF, chronic back pain, fibromyalgia, R TKA.   Clinical Impression   Pt. presents with weakness, decreased activity tolerance, is on 5LO2, and presents with limited functional mobility, obesity, and pain which hinder her ability to complete ADLs, and IADLs. Pt. Resides at home alone. Pt. Reports her grandson in in, and out, and is able to assist her on a limited basis. Pt. required assist with basic morning care including bathing skills. Pt. was able to assist with dressing. Pt. received meals on wheels, however became progressively weaker, and was unable to open the door for them so she stopped receiving the meals. Pt. reports that her sister has been making a plate for her. Pt. Is on 2L of O2 at home. Pt.'s family assist with driving pt. to appointments. Pt. education was provided verbally about positioning, and ADLs. Pt. Could benefit from skilled OT serivces for ADL training, A/E training, UE there. Ex., and pt. Education about energy conservation, work simplification, home modification, and DME. Pt. Would beneift from SNF level of care upon discharge, with follow-up OT services.     Follow Up Recommendations  SNF    Equipment Recommendations       Recommendations for Other Services       Precautions / Restrictions Precautions Precautions: Fall Restrictions Weight Bearing Restrictions: No             ADL either performed or assessed with clinical judgement   ADL Overall ADL's : Needs  assistance/impaired Eating/Feeding: Set up;Minimal assistance   Grooming: Set up;Minimal assistance   Upper Body Bathing: Total assistance   Lower Body Bathing: Total assistance   Upper Body Dressing : Total assistance   Lower Body Dressing: Total assistance   Toilet Transfer: Total assistance   Toileting- Clothing Manipulation and Hygiene: Total assistance               Vision         Perception     Praxis      Pertinent Vitals/Pain Pain Assessment: 0-10 Pain Score: 8  Pain Location: bottom Pain Descriptors / Indicators: Sore;Tender;Constant Pain Intervention(s): Limited activity within patient's tolerance;Monitored during session     Hand Dominance Right   Extremity/Trunk Assessment Upper Extremity Assessment Upper Extremity Assessment: Generalized weakness(reports left arm soreness/discomfort)           Communication Communication Communication: No difficulties   Cognition Arousal/Alertness: Awake/alert Behavior During Therapy: WFL for tasks assessed/performed Overall Cognitive Status: Within Functional Limits for tasks assessed                                     General Comments       Exercises     Shoulder Instructions      Home Living Family/patient expects to be discharged to:: Private residence Living Arrangements: Alone Available Help at Discharge: (Limited. grandson.) Type of Home: Apartment Home Access: Level entry     Home Layout: One level     Bathroom Shower/Tub: Walk-in shower  Bathroom Toilet: Programmer, systems: Yes   Home Equipment: Wheelchair - manual;Hospital bed;Grab bars - tub/shower          Prior Functioning/Environment Level of Independence: Needs assistance    ADL's / Homemaking Assistance Needed: Pt. required assist form family for sponge bathing. pt. was able to dress self. Pt. was receiving measls on wheels, however over the past week pt. became too weak to answer the  door. Pt.'s sister started bringing her a plate of food.     Comments: Baseline 2 L home O2 use.        OT Problem List: Decreased strength;Decreased activity tolerance;Pain;Obesity;Impaired UE functional use;Decreased knowledge of use of DME or AE      OT Treatment/Interventions: Self-care/ADL training;Therapeutic exercise;Patient/family education;DME and/or AE instruction;Therapeutic activities;Energy conservation    OT Goals(Current goals can be found in the care plan section) Acute Rehab OT Goals Patient Stated Goal: To have less pain, and to be able to do more for herself OT Goal Formulation: With patient Potential to Achieve Goals: Good  OT Frequency: Min 1X/week   Barriers to D/C:            Co-evaluation              AM-PAC PT "6 Clicks" Daily Activity     Outcome Measure Help from another person eating meals?: A Little Help from another person taking care of personal grooming?: A Little Help from another person toileting, which includes using toliet, bedpan, or urinal?: Total Help from another person bathing (including washing, rinsing, drying)?: Total Help from another person to put on and taking off regular upper body clothing?: Total Help from another person to put on and taking off regular lower body clothing?: Total 6 Click Score: 10   End of Session    Activity Tolerance: Patient tolerated treatment well Patient left: in bed;with call bell/phone within reach;with bed alarm set  OT Visit Diagnosis: Muscle weakness (generalized) (M62.81)                Time: 5397-6734 OT Time Calculation (min): 25 min Charges:  OT General Charges $OT Visit: 1 Visit OT Evaluation $OT Eval Moderate Complexity: 1 Mod G-Codes: OT G-codes **NOT FOR INPATIENT CLASS** Functional Limitation: Self care Self Care Current Status (L9379): At least 80 percent but less than 100 percent impaired, limited or restricted Self Care Goal Status (K2409): At least 20 percent but less  than 40 percent impaired, limited or restricted   Harrel Carina, MS, OTR/L  Harrel Carina, MS, OTR/L 01/19/2017, 4:02 PM

## 2017-01-19 NOTE — Care Management Important Message (Signed)
Important Message  Patient Details  Name: Loretta Wade MRN: [id number] Date of Birth: 24-Apr-1950   Medicare Important Message Given:  Yes Signed IM notice given    Katrina Stack, RN 01/19/2017, 1:16 PM

## 2017-01-19 NOTE — Care Management Note (Signed)
Case Management Note  Patient Details  Name: Loretta Wade MRN: [id number] Date of Birth: [birth year]  Subjective/Objective:                 Patient presents from home with shortness of breath- mild CHF.  she has chronic oxygen through [place] and currently open to [facility].  Physical therapy is recommending skilled nursing placement. Would be in agreement to placement short term and preference is [facility]   Action/Plan:  Obtained order for OT consult. Updated CSW of recommendation and patient in agreement Expected Discharge Date:                  Expected Discharge Plan:     In-House Referral:     Discharge planning Services     Post Acute Care Choice:    Choice offered to:     DME Arranged:    DME Agency:     HH Arranged:    HH Agency:     Status of Service:     If discussed at [facility] [employer], dates discussed:    Additional Comments:  Katrina Stack, RN 01/19/2017, 1:18 PM

## 2017-01-19 NOTE — Progress Notes (Signed)
Pharmacist - Prescriber Communication  Enoxaparin dose modified from 40 mg subcutaneously once daily to 40 mg subcutaneously twice daily due to BMI greater than 40.  Mardell Cragg A. [place], [place].D., BCPS Clinical Pharmacist 01/19/2017 10:35

## 2017-01-19 NOTE — NC FL2 (Signed)
[place] LEVEL OF CARE SCREENING TOOL     IDENTIFICATION  Patient Name: Loretta Wade Birthdate: Jul 26, 1950 Sex: female Admission Date (Current Location): 01/18/2017  [place] and [place] Number:  Engineering geologist and Address:  [facility], [address] [place], [place] [address]      Provider Number: [id number]  Attending Physician Name and Address:  Vaughan Basta, *  Relative Name and Phone Number:  Lewis Shock [phone] or Dasiah, [place] [phone] or Miles,Kim Niece [phone] or  Miles,Sherri Niece [phone]     Current Level of Care: Hospital Recommended Level of Care: [place] Prior Approval Number:    Date Approved/Denied:   PASRR Number: [id number] A Discharge Plan: SNF    Current Diagnoses: Patient Active Problem List   Diagnosis Date Noted  . CHF (congestive heart failure) ([place]) 01/18/2017  . Candidal intertrigo 02/08/2016  . Dysuria 02/08/2016  . Requires assistance with activities of daily living (ADL) 02/08/2016  . Oral thrush 11/10/2015  . Tachypnea 11/01/2015  . Acute non-recurrent maxillary sinusitis 10/29/2015  . Fall 07/11/2015  . Dyslipidemia 06/17/2015  . Overflow stress incontinence of urine in female 06/17/2015  . Chronic pain 06/10/2015  . Chronic low back pain (Location of Tertiary source of pain) (Bilateral) (R>L) 06/10/2015  . Chronic shoulder pain (Location of Secondary source of pain) (Left) 06/10/2015  . Costochondritis (Location of Primary Source of Pain) 06/10/2015  . Long term current use of opiate analgesic 06/10/2015  . Long term prescription opiate use 06/10/2015  . Opiate use 06/10/2015  . Encounter for therapeutic drug level monitoring 06/10/2015  . Encounter for pain management planning 06/10/2015  . Chronic feet pain (Bilateral) (R>L) 06/10/2015  . Osteoarthritis 06/10/2015  . Osteoarthritis of shoulder (Left) 06/10/2015  .  COPD exacerbation ([place]) 04/12/2015  . Poor mobility 04/12/2015  . Fibromyalgia 03/23/2015  . GERD (gastroesophageal reflux disease) 03/23/2015  . MRSA infection 11/17/2014  . Chronic diastolic CHF (congestive heart failure) ([place]) 11/12/2014  . Morbid obesity with BMI of 50.0-59.9, adult ([place]) 11/12/2014  . Neck pain, chronic 11/12/2014  . COPD (chronic obstructive pulmonary disease) ([place]) 11/04/2014  . Urinary tract infection 10/02/2014    Orientation RESPIRATION BLADDER Height & Weight     Self, Time, Place, Situation  O2(2L) Continent Weight: (!) 368 lb (166.9 kg) Height:  5\' 4"  (162.6 cm)  BEHAVIORAL SYMPTOMS/MOOD NEUROLOGICAL BOWEL NUTRITION STATUS      Continent Diet(Carb Modified)  AMBULATORY STATUS COMMUNICATION OF NEEDS Skin   Limited Assist Verbally Normal                       Personal Care Assistance Level of Assistance  Bathing, Feeding, Dressing Bathing Assistance: Limited assistance Feeding assistance: Independent Dressing Assistance: Limited assistance     Functional Limitations Info  Sight, Hearing, Speech Sight Info: Adequate Hearing Info: Adequate Speech Info: Adequate    SPECIAL CARE FACTORS FREQUENCY  PT (By licensed PT), OT (By licensed OT)     PT Frequency: 5x a week OT Frequency: 5x a week            Contractures Contractures Info: Not present    Additional Factors Info  Code Status, Allergies   Psychotropic Code Status Info: Full Code Allergies Info: GABAPENTIN, TRAMADOL   citalopram (CELEXA) tablet 20 mg          Current Medications (01/19/2017):  This is the current hospital active medication list Current Facility-Administered Medications  Medication Dose  Route Frequency Provider Last Rate Last Dose  . acetaminophen (TYLENOL) tablet 650 mg  650 mg Oral Q6H PRN Fritzi Mandes, MD   650 mg at 01/18/17 1539   Or  . acetaminophen (TYLENOL) suppository 650 mg  650 mg Rectal Q6H PRN Fritzi Mandes, MD      . albuterol (PROVENTIL)  (2.5 MG/3ML) 0.083% nebulizer solution 2.5 mg  2.5 mg Nebulization Q6H PRN Fritzi Mandes, MD   2.5 mg at 01/19/17 1429  . cefTRIAXone (ROCEPHIN) 1 g in dextrose 5 % 50 mL IVPB  1 g Intravenous Q24H Fritzi Mandes, MD   Stopped at 01/19/17 1134  . chlorpheniramine-HYDROcodone (TUSSIONEX) 10-8 MG/5ML suspension 5 mL  5 mL Oral Q12H PRN Fritzi Mandes, MD      . cholecalciferol (VITAMIN D) tablet 1,000 Units  1,000 Units Oral Daily Fritzi Mandes, MD   1,000 Units at 01/19/17 1038  . citalopram (CELEXA) tablet 20 mg  20 mg Oral Daily Fritzi Mandes, MD   20 mg at 01/19/17 1039  . colesevelam [facility]) tablet 625 mg  625 mg Oral Q breakfast Fritzi Mandes, MD   625 mg at 01/19/17 0836  . enoxaparin (LOVENOX) injection 40 mg  40 mg Subcutaneous BID Vaughan Basta, MD   40 mg at 01/19/17 1139  . fluticasone (FLONASE) 50 MCG/ACT nasal spray 2 spray  2 spray Each Nare Daily PRN Fritzi Mandes, MD   2 spray at 01/19/17 1610  . furosemide (LASIX) injection 20 mg  20 mg Intravenous Q12H Fritzi Mandes, MD   20 mg at 01/19/17 0835  . HYDROcodone-acetaminophen (NORCO/VICODIN) 5-325 MG per tablet 1 tablet  1 tablet Oral Q6H PRN Vaughan Basta, MD      . hydrocortisone cream 1 %   Topical TID PRN Vaughan Basta, MD       And  . witch hazel-glycerin (TUCKS) pad   Topical PRN Vaughan Basta, MD      . loperamide (IMODIUM) capsule 2-4 mg  2-4 mg Oral PRN Fritzi Mandes, MD      . magic mouthwash  5 mL Oral TID Vaughan Basta, MD   5 mL at 01/19/17 1534  . ondansetron (ZOFRAN) tablet 4 mg  4 mg Oral Q6H PRN Fritzi Mandes, MD       Or  . ondansetron [facility]) injection 4 mg  4 mg Intravenous Q6H PRN Fritzi Mandes, MD      . oxybutynin (DITROPAN) tablet 5 mg  5 mg Oral BID Fritzi Mandes, MD   5 mg at 01/19/17 1039  . pantoprazole (PROTONIX) EC tablet 40 mg  40 mg Oral Daily Fritzi Mandes, MD   40 mg at 01/19/17 1038  . pregabalin (LYRICA) capsule 150 mg  150 mg Oral BID Fritzi Mandes, MD   150 mg at 01/19/17 1039   . rOPINIRole (REQUIP) tablet 0.75 mg  0.75 mg Oral QHS Fritzi Mandes, MD   0.75 mg at 01/18/17 2132  . umeclidinium-vilanterol (ANORO ELLIPTA) 62.5-25 MCG/INH 1 puff  1 puff Inhalation Daily Fritzi Mandes, MD   1 puff at 01/19/17 [phone]  . vitamin B-12 (CYANOCOBALAMIN) tablet 500 mcg  500 mcg Oral Daily Fritzi Mandes, MD   500 mcg at 01/19/17 1039     Discharge Medications: Please see discharge summary for a list of discharge medications.  Relevant Imaging Results:  Relevant Lab Results:   Additional Information SSN [id number]  Ross Ludwig, [place]

## 2017-01-19 NOTE — Clinical Social Work Note (Signed)
Clinical Social Work Assessment  Patient Details  Name: Loretta Wade MRN: [id number] Date of Birth: [birth year]  Date of referral:  01/19/17               Reason for consult:  Facility Placement                Permission sought to share information with:  Facility Sport and exercise psychologist, Family Supports Permission granted to share information::  Yes, Verbal Permission Granted  Name::     Lewis Shock [phone] or Kahlan, [place]gebret[place] [phone] or Miles,Kim Niece [phone] or  Miles,Sherri Niece [phone]   Agency::  SNF admissions  Relationship::     Contact Information:     Housing/Transportation Living arrangements for the past 2 months:  [place] of Information:  Patient Patient Interpreter Needed:  None Criminal Activity/Legal Involvement Pertinent to Current Situation/Hospitalization:  No - Comment as needed Significant Relationships:  Other Family Members, Adult Children Lives with:  Self Do you feel safe going back to the place where you live?  No Need for family participation in patient care:  No (Coment)  Care giving concerns:  Patient feels that she needs some short term rehab before she is able to return back home.   Social Worker assessment / plan: Patient is a 66 year old female who is alert and oriented x4.  Patient states she has been to rehab at [facility] and [facility].  She would prefer to go to one of those facilities.  However she is open to other facilities if needed.  Patient was explained how insurance will pay for the stay at SNF and what the process is for looking for placement.  CSW discussed with patient that she said she lives alone, and does have family, who visit her but they do not provide care for her at home.  Patient did not express any other questions or concerns, she gave CSW permission to begin bed search process in [facility].  Employment status:  Disabled (Comment on whether or not currently  receiving Disability), Retired Nurse, adult PT Recommendations:  [place] / Referral to community resources:     Patient/Family's Response to care:  Patient is in agreement to going to SNF for short term rehab.  Patient/Family's Understanding of and Emotional Response to Diagnosis, Current Treatment, and Prognosis:  Patient is aware of current treatment plan and diagnosis.  Emotional Assessment Appearance:  Appears stated age Attitude/Demeanor/Rapport:    Affect (typically observed):  Appropriate, Calm Orientation:  Oriented to Self, Oriented to Place, Oriented to  Time, Oriented to Situation Alcohol / Substance use:  Not Applicable Psych involvement (Current and /or in the community):  No (Comment)  Discharge Needs  Concerns to be addressed:  Lack of Support Readmission within the last 30 days:  Yes(11-28-16 to patient's house.) Current discharge risk:  Lives alone Barriers to Discharge:  Continued Medical Work up, [employer]   Ross Ludwig, [place] 01/19/2017, 5:21 PM

## 2017-01-20 LAB — BASIC METABOLIC PANEL
Anion gap: 9 (ref 5–15)
BUN: 15 mg/dL (ref 6–20)
CALCIUM: 8.7 mg/dL — AB (ref 8.9–10.3)
CO2: 39 mmol/L — ABNORMAL HIGH (ref 22–32)
CREATININE: 0.75 mg/dL (ref 0.44–1.00)
Chloride: 95 mmol/L — ABNORMAL LOW (ref 101–111)
GFR calc Af Amer: >60 mL/min (ref >60)
GFR calc non Af Amer: >60 mL/min (ref >60)
Glucose, Bld: 96 mg/dL (ref 65–99)
Potassium: 3.4 mmol/L — ABNORMAL LOW (ref 3.5–5.1)
SODIUM: 143 mmol/L (ref 135–145)

## 2017-01-20 MED ORDER — DOCUSATE SODIUM 100 MG PO CAPS
100.0000 mg | ORAL_CAPSULE | Freq: Two times a day (BID) | ORAL | Status: DC | PRN
  Administered 2017-01-20 – 2017-01-25 (×2): 100 mg via ORAL
  Filled 2017-01-20 (×2): qty 1

## 2017-01-20 MED ORDER — SODIUM CHLORIDE 0.9% FLUSH
3.0000 mL | Freq: Two times a day (BID) | INTRAVENOUS | Status: DC
  Administered 2017-01-20 – 2017-01-27 (×17): 3 mL via INTRAVENOUS

## 2017-01-20 MED ORDER — FUROSEMIDE 10 MG/ML IJ SOLN
60.0000 mg | Freq: Two times a day (BID) | INTRAMUSCULAR | Status: DC
  Administered 2017-01-20 – 2017-01-23 (×7): 60 mg via INTRAVENOUS
  Filled 2017-01-20 (×7): qty 6

## 2017-01-20 MED ORDER — POTASSIUM CHLORIDE CRYS ER 20 MEQ PO TBCR
40.0000 meq | EXTENDED_RELEASE_TABLET | ORAL | Status: AC
  Administered 2017-01-20 (×2): 40 meq via ORAL
  Filled 2017-01-20 (×2): qty 2

## 2017-01-20 MED ORDER — ACETAZOLAMIDE 250 MG PO TABS
500.0000 mg | ORAL_TABLET | Freq: Once | ORAL | Status: AC
  Administered 2017-01-20: 500 mg via ORAL
  Filled 2017-01-20: qty 2

## 2017-01-20 MED ORDER — POLYETHYLENE GLYCOL 3350 17 G PO PACK
17.0000 g | PACK | Freq: Two times a day (BID) | ORAL | Status: AC
  Administered 2017-01-20 – 2017-01-21 (×4): 17 g via ORAL
  Filled 2017-01-20 (×4): qty 1

## 2017-01-20 NOTE — Progress Notes (Signed)
[place] [place] NAME: Loretta Wade    MR#:  [id number]  DATE OF BIRTH:  1950-06-14  SUBJECTIVE:  CHIEF COMPLAINT:   Chief Complaint  Patient presents with  . Failure To Thrive   Admitted for acute on chronic diastolic congestive heart failure and UTI with severe weakness.  Constipation  REVIEW OF SYSTEMS:   Constitutional: Negative for chills, fever and weight loss.  HENT: Negative for ear discharge, ear pain and nosebleeds.   Eyes: Negative for blurred vision, pain and discharge.  Respiratory: Positive for shortness of breath. Negative for sputum production, wheezing and stridor.   Cardiovascular: Negative for chest pain, palpitations, orthopnea and PND.  Gastrointestinal: Positive for diarrhea. Negative for abdominal pain, nausea and vomiting.  Genitourinary: Positive for dysuria. Negative for frequency and urgency.  Musculoskeletal: Positive for back pain. Negative for joint pain.  Neurological: Positive for weakness. Negative for sensory change, speech change and focal weakness.  Psychiatric/Behavioral: Negative for depression and hallucinations. The patient is not nervous/anxious.      ROS  DRUG ALLERGIES:   Allergies  Allergen Reactions  . Gabapentin     Pt reports dizziness and abnormal urine labs.  . Tramadol Nausea And Vomiting    VITALS:  Blood pressure (!) 125/49, pulse 60, temperature 98.9 F (37.2 C), temperature source Oral, resp. rate 18, height 5\' 4"  (1.626 m), weight (!) 166 kg (366 lb), SpO2 98 %.  PHYSICAL EXAMINATION:   GENERAL:  66 y.o.-year-old patient lying in the bed with no acute distress. Morbidly obese EYES: Pupils equal, round, reactive to light and accommodation. No scleral icterus. Extraocular muscles intact.  HEENT: Head atraumatic, normocephalic. Oropharynx and nasopharynx clear.  NECK:  Supple, no jugular venous distention. No thyroid enlargement, no tenderness.  LUNGS: Normal  breath sounds bilaterally, no wheezing, rales,rhonchi or crepitation. No use of accessory muscles of respiration. Decreased bs bases CARDIOVASCULAR: S1, S2 normal. No murmurs, rubs, or gallops.  ABDOMEN: Soft, nontender, nondistended. Bowel sounds present. No organomegaly or mass. obese EXTREMITIES: ++ pedal edema, no cyanosis, or clubbing.  NEUROLOGIC: Cranial nerves II through XII are intact. Muscle strength 5/5 in all extremities. Sensation intact. Gait not checked.  PSYCHIATRIC: The patient is alert and oriented x 3.  SKIN: No obvious rash, lesion, or ulcer.    Physical Exam LABORATORY PANEL:   CBC Recent Labs  Lab 01/18/17 1142  WBC 6.1  HGB 10.8*  HCT 35.2  PLT 196   ------------------------------------------------------------------------------------------------------------------  Chemistries  Recent Labs  Lab 01/18/17 1142  01/20/17 0440  NA 140   < > 143  K 3.9   < > 3.4*  CL 94*   < > 95*  CO2 35*   < > 39*  GLUCOSE 113*   < > 96  BUN 14   < > 15  CREATININE 0.75   < > 0.75  CALCIUM 9.2   < > 8.7*  AST 18  --   --   ALT 19  --   --   ALKPHOS 83  --   --   BILITOT 0.9  --   --    < > = values in this interval not displayed.   ------------------------------------------------------------------------------------------------------------------  Cardiac Enzymes Recent Labs  Lab 01/18/17 1142  TROPONINI <0.03   ------------------------------------------------------------------------------------------------------------------  RADIOLOGY:  Dg Chest 2 View  Result Date: 01/18/2017 CLINICAL DATA:  Shortness of breath EXAM: CHEST  2 VIEW COMPARISON:  11/24/2016 and previous FINDINGS: Limited by patient's  size. Probable venous hypertension and mild interstitial edema. Question small effusions. Upper lungs are clear. IMPRESSION: Probable congestive heart failure with venous hypertension and interstitial edema. Electronically Signed   By: Nelson Chimes M.D.   On:  01/18/2017 12:36    ASSESSMENT AND PLAN:   Active Problems:   CHF (congestive heart failure) (HCC)  Loretta Wade  is a 66 y.o. female with a known history of morbid obesity, chronic respiratory failure secondary to CHF and COPD on 2 L nasal cannula oxygen, chronic hypercarbia, sleep apnea not on CPAP comes to the emergency room with increasing weakness, shortness of breath  *Acute on chronic diastolic congestive heart failure - IV Lasix, - Input and Output - Counseled to limit fluids and Salt - Monitor Bun/Cr and Potassium - Echo-reviewed -Cardiology follow up after discharge   * UTI -IV Rocephin  *Constipation.  Add scheduled MiraLAX for 2 days.  *  Hypertension continue home meds  *  Chronic hypercarbic respiratory failure secondary to COPD/suspected obesity hypoventilation syndrome/history of sleep apnea not on CPAP -Patient has chronic hypercarbia continue to monitor.  Her mental status is stable.  Continue oxygenation -Nebs as needed  *  Physical therapy For generalized weakness deconditioning Skilled nursing facility at discharge  *Anemia of chronic disease is stable  All the records are reviewed and case discussed with Care Management/Social Workerr. Management plans discussed with the patient, family and they are in agreement.  CODE STATUS: Full.  TOTAL TIME TAKING CARE OF THIS PATIENT: 35 minutes.   POSSIBLE D/C IN 1-2 DAYS, DEPENDING ON CLINICAL CONDITION.  Neita Carp M.D on 01/20/2017   Between 7am to 6pm - Pager - [phone]  After 6pm go to [email or web address] - password [email or web address] [facility]  Office  [phone]  CC: Primary care physician; Clovia Cuff, MD  Note: This dictation was prepared with Dragon dictation along with smaller phrase technology. Any transcriptional errors that result from this process are unintentional.

## 2017-01-20 NOTE — Progress Notes (Signed)
Clinical Social Worker (CSW) met with patient and made her aware that only bed offer is [facility]. CSW made patient aware that [facility] can't make an offer. Patient reported that if [facility] is her only bed offer then she will go home. CSW will continue to follow and assist as needed.   [employer], LCSW [phone]

## 2017-01-20 NOTE — Progress Notes (Signed)
Patient has been complaining for approximately an hour that she is constipated and hasn't had a BM for over a week, despite reporting on admission that she was weak secondary to diarrhea.  Miralax given this morning.  Turned patient to do a rectal exam and found her having a large soft, brown stool.  Stool is not running, and does not have any hard portions.

## 2017-01-21 LAB — BASIC METABOLIC PANEL
Anion gap: 10 (ref 5–15)
BUN: 13 mg/dL (ref 6–20)
CO2: 36 mmol/L — ABNORMAL HIGH (ref 22–32)
CREATININE: 0.78 mg/dL (ref 0.44–1.00)
Calcium: 9 mg/dL (ref 8.9–10.3)
Chloride: 95 mmol/L — ABNORMAL LOW (ref 101–111)
GFR calc Af Amer: >60 mL/min (ref >60)
GLUCOSE: 99 mg/dL (ref 65–99)
POTASSIUM: 3.8 mmol/L (ref 3.5–5.1)
Sodium: 141 mmol/L (ref 135–145)

## 2017-01-21 MED ORDER — ACETAZOLAMIDE 250 MG PO TABS
500.0000 mg | ORAL_TABLET | Freq: Once | ORAL | Status: AC
  Administered 2017-01-21: 500 mg via ORAL
  Filled 2017-01-21 (×2): qty 2

## 2017-01-21 MED ORDER — POTASSIUM CHLORIDE CRYS ER 20 MEQ PO TBCR
40.0000 meq | EXTENDED_RELEASE_TABLET | Freq: Once | ORAL | Status: AC
  Administered 2017-01-21: 40 meq via ORAL
  Filled 2017-01-21: qty 2

## 2017-01-21 NOTE — Progress Notes (Signed)
Pt has slept well throughout the night with c/o pain x1, pt. Medicated with effective results.

## 2017-01-21 NOTE — Progress Notes (Signed)
[facility] [place] NAME: Jeanann Balinski    MR#:  [id number]  DATE OF BIRTH:  Jul 23, 1950  SUBJECTIVE:  CHIEF COMPLAINT:   Chief Complaint  Patient presents with  . Failure To Thrive   Continues to feel short of breath.  Shallow breathing.  Diuresing well.  No cough. Had mild confusion at home which has resolved.  REVIEW OF SYSTEMS:   Constitutional: Negative for chills, fever and weight loss.  HENT: Negative for ear discharge, ear pain and nosebleeds.   Eyes: Negative for blurred vision, pain and discharge.  Respiratory: Positive for shortness of breath. Negative for sputum production, wheezing and stridor.   Cardiovascular: Negative for chest pain, palpitations, orthopnea and PND.  Gastrointestinal: Positive for diarrhea. Negative for abdominal pain, nausea and vomiting.  Genitourinary: Positive for dysuria. Negative for frequency and urgency.  Musculoskeletal: Positive for back pain. Negative for joint pain.  Neurological: Positive for weakness. Negative for sensory change, speech change and focal weakness.  Psychiatric/Behavioral: Negative for depression and hallucinations. The patient is not nervous/anxious.      ROS  DRUG ALLERGIES:   Allergies  Allergen Reactions  . Gabapentin     Pt reports dizziness and abnormal urine labs.  . Tramadol Nausea And Vomiting    VITALS:  Blood pressure (!) 117/50, pulse 63, temperature 98.8 F (37.1 C), temperature source Oral, resp. rate 16, height 5\' 4"  (1.626 m), weight (!) 165.6 kg (365 lb), SpO2 97 %.  PHYSICAL EXAMINATION:   GENERAL:  66 y.o.-year-old patient lying in the bed with no acute distress. Morbidly obese EYES: Pupils equal, round, reactive to light and accommodation. No scleral icterus. Extraocular muscles intact.  HEENT: Head atraumatic, normocephalic. Oropharynx and nasopharynx clear.  NECK:  Supple, no jugular venous distention. No thyroid enlargement, no tenderness.   LUNGS: Normal breath sounds bilaterally, no wheezing, rales,rhonchi or crepitation. No use of accessory muscles of respiration. Decreased bs bases CARDIOVASCULAR: S1, S2 normal. No murmurs, rubs, or gallops.  ABDOMEN: Soft, nontender, nondistended. Bowel sounds present. No organomegaly or mass. obese EXTREMITIES: ++ pedal edema, no cyanosis, or clubbing.  NEUROLOGIC: Cranial nerves II through XII are intact. Muscle strength 5/5 in all extremities. Sensation intact. Gait not checked.  PSYCHIATRIC: The patient is alert and oriented x 3.  SKIN: No obvious rash, lesion, or ulcer.    Physical Exam LABORATORY PANEL:   CBC Recent Labs  Lab 01/18/17 1142  WBC 6.1  HGB 10.8*  HCT 35.2  PLT 196   ------------------------------------------------------------------------------------------------------------------  Chemistries  Recent Labs  Lab 01/18/17 1142  01/21/17 0501  NA 140   < > 141  K 3.9   < > 3.8  CL 94*   < > 95*  CO2 35*   < > 36*  GLUCOSE 113*   < > 99  BUN 14   < > 13  CREATININE 0.75   < > 0.78  CALCIUM 9.2   < > 9.0  AST 18  --   --   ALT 19  --   --   ALKPHOS 83  --   --   BILITOT 0.9  --   --    < > = values in this interval not displayed.   ------------------------------------------------------------------------------------------------------------------  Cardiac Enzymes Recent Labs  Lab 01/18/17 1142  TROPONINI <0.03   ------------------------------------------------------------------------------------------------------------------  RADIOLOGY:  No results found.  ASSESSMENT AND PLAN:   Active Problems:   CHF (congestive heart failure) ([place])  Meredeth Ide  is a 66 y.o. female with a known history of morbid obesity, chronic respiratory failure secondary to CHF and COPD on 2 L nasal cannula oxygen, chronic hypercarbia, sleep apnea not on CPAP comes to the emergency room with increasing weakness, shortness of breath  *Acute on chronic diastolic  congestive heart failure - IV Lasix - Input and Output - Counseled to limit fluids and Salt - Monitor Bun/Cr and Potassium - Echo-reviewed -Cardiology follow up after discharge Diamox due to alkalosis.  Will need further inpatient diuresis.  * UTI -IV Rocephin  *Constipation.  Added scheduled MiraLAX  *  Hypertension continue home meds  *  Chronic hypercarbic respiratory failure secondary to COPD/suspected obesity hypoventilation syndrome/history of sleep apnea not on CPAP -Patient has chronic hypercarbia continue to monitor.  Her mental status is stable.  Continue oxygenation -Nebs as needed  *  Physical therapy For generalized weakness deconditioning Skilled nursing facility at discharge.   *Anemia of chronic disease is stable  All the records are reviewed and case discussed with Care Management/Social Workerr. Management plans discussed with the patient, family and they are in agreement.  CODE STATUS: Full.  TOTAL TIME TAKING CARE OF THIS PATIENT: 35 minutes.   POSSIBLE D/C IN 1-2 DAYS, DEPENDING ON CLINICAL CONDITION.  Neita Carp M.D on 01/21/2017   Between 7am to 6pm - Pager - [phone]  After 6pm go to [email or web address] - password [email or web address] [facility]  Office  [phone]  CC: Primary care physician; Clovia Cuff, MD  Note: This dictation was prepared with Dragon dictation along with smaller phrase technology. Any transcriptional errors that result from this process are unintentional.

## 2017-01-22 LAB — BASIC METABOLIC PANEL
ANION GAP: 11 (ref 5–15)
BUN: 12 mg/dL (ref 6–20)
CHLORIDE: 93 mmol/L — AB (ref 101–111)
CO2: 34 mmol/L — ABNORMAL HIGH (ref 22–32)
Calcium: 9.3 mg/dL (ref 8.9–10.3)
Creatinine, Ser: 0.87 mg/dL (ref 0.44–1.00)
GFR calc non Af Amer: >60 mL/min (ref >60)
Glucose, Bld: 184 mg/dL — ABNORMAL HIGH (ref 65–99)
POTASSIUM: 4.2 mmol/L (ref 3.5–5.1)
SODIUM: 138 mmol/L (ref 135–145)

## 2017-01-22 LAB — MAGNESIUM: MAGNESIUM: 2.3 mg/dL (ref 1.7–2.4)

## 2017-01-22 MED ORDER — POLYETHYLENE GLYCOL 3350 17 G PO PACK
17.0000 g | PACK | Freq: Two times a day (BID) | ORAL | Status: AC
  Administered 2017-01-22 – 2017-01-23 (×3): 17 g via ORAL
  Filled 2017-01-22 (×4): qty 1

## 2017-01-22 MED ORDER — ACETAZOLAMIDE 250 MG PO TABS
500.0000 mg | ORAL_TABLET | Freq: Once | ORAL | Status: AC
  Administered 2017-01-22: 500 mg via ORAL
  Filled 2017-01-22: qty 2

## 2017-01-22 MED ORDER — POTASSIUM CHLORIDE CRYS ER 20 MEQ PO TBCR
40.0000 meq | EXTENDED_RELEASE_TABLET | Freq: Once | ORAL | Status: AC
  Administered 2017-01-22: 40 meq via ORAL
  Filled 2017-01-22: qty 2

## 2017-01-22 NOTE — Discharge Instructions (Signed)
Heart Failure Clinic appointment on February 06 2017 at 12:00pm with Darylene Price, [place]. Please call [phone] to reschedule.

## 2017-01-22 NOTE — Progress Notes (Signed)
Physical Therapy Treatment Patient Details Name: Loretta Wade MRN: [id number] DOB: 1950/07/16 Today's Date: 01/22/2017    History of Present Illness Pt is a 66 y.o. female who was admitted to the [facility] with diarrhea, abdominal pain, possible UTI, incontinence, and SOB.  Pt  has acute on chronic diastolic CHF, UTI, diarrhea, htn, chronic hypercarbic respiratory failure secondary to COPD/suspected obesity hypoventilation,  Pt. PMHx includes: COPD (on 2L home O2), CHF, chronic back pain, fibromyalgia, R TKA.    PT Comments    Pt declined OOB mobility d/t not feeling well (pt reports feeling "shaky" and just getting off of phone with MD; nursing aware); pt agreeable to in bed ex's.  Pt requiring pacing and rest breaks d/t weakness and SOB with ex's in bed.  Overall pt appearing motivated to participate in strengthening ex's during session and pt reported she would try to get up with therapy next session.    Follow Up Recommendations  SNF     Equipment Recommendations  Rolling walker with 5" wheels;Wheelchair (measurements PT)(pt has manual w/c at home)    Recommendations for Other Services       Precautions / Restrictions Precautions Precautions: Fall Restrictions Weight Bearing Restrictions: No    Mobility  Bed Mobility               General bed mobility comments: Pt declined OOB mobility (pt reports recently getting off phone with MD who was going to check some labs d/t pt not feeling well)  Transfers                    Ambulation/Gait                 Stairs            Wheelchair Mobility    Modified Rankin (Stroke Patients Only)       Balance                                            Cognition Arousal/Alertness: Awake/alert Behavior During Therapy: WFL for tasks assessed/performed Overall Cognitive Status: Within Functional Limits for tasks assessed                                         Exercises Total Joint Exercises Ankle Circles/Pumps: AROM;Strengthening;Both;10 reps;Supine Quad Sets: AROM;Strengthening;Both;10 reps;Supine Short Arc Quad: AROM;Strengthening;Both;10 reps;Supine Heel Slides: AAROM;Strengthening;Both;10 reps;Supine Hip ABduction/ADduction: AAROM;Strengthening;Both;10 reps;Supine Straight Leg Raises: AAROM;Strengthening;Both;10 reps;Supine General Exercises - Upper Extremity Shoulder Flexion: AROM;Strengthening;Right;10 reps;Supine(deferred L UE d/t chronic L shoulder pain with this motion) Elbow Flexion: AROM;Strengthening;Both;10 reps;Supine Elbow Extension: AROM;Strengthening;Both;10 reps;Supine Other Exercises Other Exercises: B hand grip squeezes x10 reps    General Comments General comments (skin integrity, edema, etc.): Pt in bariatric rotating bed.  External catheter in place.  Nursing cleared pt for participation in physical therapy.  Pt agreeable to above noted PT session.      Pertinent Vitals/Pain Pain Assessment: 0-10 Pain Score: 7  Pain Location: chronic pain "all over" Pain Descriptors / Indicators: Sore;Tender;Constant Pain Intervention(s): Limited activity within patient's tolerance;Monitored during session;Repositioned;Premedicated before session  Vitals (HR and O2 on 3 L via nasal cannula) stable and WFL throughout treatment session.    Home Living  Prior Function            PT Goals (current goals can now be found in the care plan section) Acute Rehab PT Goals Patient Stated Goal: to improve strength PT Goal Formulation: With patient Time For Goal Achievement: 02/02/17 Potential to Achieve Goals: Fair Progress towards PT goals: Progressing toward goals(with LE strengthening)    Frequency    Min 2X/week      PT Plan Current plan remains appropriate    Co-evaluation              AM-PAC PT "6 Clicks" Daily Activity  Outcome Measure  Difficulty turning over in bed (including  adjusting bedclothes, sheets and blankets)?: Unable Difficulty moving from lying on back to sitting on the side of the bed? : Unable Difficulty sitting down on and standing up from a chair with arms (e.g., wheelchair, bedside commode, etc,.)?: Unable Help needed moving to and from a bed to chair (including a wheelchair)?: A Lot Help needed walking in hospital room?: Total Help needed climbing 3-5 steps with a railing? : Total 6 Click Score: 7    End of Session Equipment Utilized During Treatment: Oxygen(3 L O2 via nasal cannula) Activity Tolerance: Patient limited by pain;Other (comment)(d/t not feeling well in general) Patient left: in bed;with call bell/phone within reach;with bed alarm set Nurse Communication: Mobility status;Precautions(Pt's pain status) PT Visit Diagnosis: Other abnormalities of gait and mobility (R26.89);Muscle weakness (generalized) (M62.81)     Time: 6440-3474 PT Time Calculation (min) (ACUTE ONLY): 25 min  Charges:  $Therapeutic Exercise: 23-37 mins                    G CodesLeitha Bleak, PT 01/22/17, 3:20 PM [phone]

## 2017-01-22 NOTE — Progress Notes (Signed)
[facility] [place] NAME: Loretta Wade    MR#:  [id number]  DATE OF BIRTH:  28-Sep-1950  SUBJECTIVE:  CHIEF COMPLAINT:   Chief Complaint  Patient presents with  . Failure To Thrive   Still feels weak and has SOB. Good diuresis -7.5 L this admission  REVIEW OF SYSTEMS:   Constitutional: Negative for chills, fever and weight loss.  HENT: Negative for ear discharge, ear pain and nosebleeds.   Eyes: Negative for blurred vision, pain and discharge.  Respiratory: Positive for shortness of breath. Negative for sputum production, wheezing and stridor.   Cardiovascular: Negative for chest pain, palpitations, orthopnea and PND.  Gastrointestinal: Positive for diarrhea. Negative for abdominal pain, nausea and vomiting.  Genitourinary: Positive for dysuria. Negative for frequency and urgency.  Musculoskeletal: Positive for back pain. Negative for joint pain.  Neurological: Positive for weakness. Negative for sensory change, speech change and focal weakness.  Psychiatric/Behavioral: Negative for depression and hallucinations. The patient is not nervous/anxious.      ROS  DRUG ALLERGIES:   Allergies  Allergen Reactions  . Gabapentin     Pt reports dizziness and abnormal urine labs.  . Tramadol Nausea And Vomiting    VITALS:  Blood pressure (!) 129/42, pulse 91, temperature 98 F (36.7 C), temperature source Oral, resp. rate 18, height 5\' 4"  (1.626 m), weight (!) 159.2 kg (351 lb), SpO2 94 %.  PHYSICAL EXAMINATION:   GENERAL:  66 y.o.-year-old patient lying in the bed with no acute distress. Morbidly obese EYES: Pupils equal, round, reactive to light and accommodation. No scleral icterus. Extraocular muscles intact.  HEENT: Head atraumatic, normocephalic. Oropharynx and nasopharynx clear.  NECK:  Supple, no jugular venous distention. No thyroid enlargement, no tenderness.  LUNGS: Normal breath sounds bilaterally, no wheezing,  rales,rhonchi or crepitation. No use of accessory muscles of respiration. Decreased bs bases CARDIOVASCULAR: S1, S2 normal. No murmurs, rubs, or gallops.  ABDOMEN: Soft, nontender, nondistended. Bowel sounds present. No organomegaly or mass. obese EXTREMITIES: ++ pedal edema, no cyanosis, or clubbing.  NEUROLOGIC: Cranial nerves II through XII are intact. Muscle strength 5/5 in all extremities. Sensation intact. Gait not checked.  PSYCHIATRIC: The patient is alert and oriented x 3.  SKIN: No obvious rash, lesion, or ulcer.    Physical Exam LABORATORY PANEL:   CBC Recent Labs  Lab 01/18/17 1142  WBC 6.1  HGB 10.8*  HCT 35.2  PLT 196   ------------------------------------------------------------------------------------------------------------------  Chemistries  Recent Labs  Lab 01/18/17 1142  01/21/17 0501  NA 140   < > 141  K 3.9   < > 3.8  CL 94*   < > 95*  CO2 35*   < > 36*  GLUCOSE 113*   < > 99  BUN 14   < > 13  CREATININE 0.75   < > 0.78  CALCIUM 9.2   < > 9.0  AST 18  --   --   ALT 19  --   --   ALKPHOS 83  --   --   BILITOT 0.9  --   --    < > = values in this interval not displayed.   ------------------------------------------------------------------------------------------------------------------  Cardiac Enzymes Recent Labs  Lab 01/18/17 1142  TROPONINI <0.03   ------------------------------------------------------------------------------------------------------------------  RADIOLOGY:  No results found.  ASSESSMENT AND PLAN:   Active Problems:   CHF (congestive heart failure) ([facility])  Keyoni Lapinski  is a 66 y.o. female with a known history of  morbid obesity, chronic respiratory failure secondary to CHF and COPD on 2 L nasal cannula oxygen, chronic hypercarbia, sleep apnea not on CPAP comes to the emergency room with increasing weakness, shortness of breath  *Acute on chronic diastolic congestive heart failure - IV Lasix - Input and  Output - Counseled to limit fluids and Salt - Monitor Bun/Cr and Potassium - Echo-reviewed -Cardiology follow up after discharge Diamox due to alkalosis.  Will need further inpatient diuresis for one more day  * UTI -IV Rocephin  *Constipation.  Added scheduled MiraLAX  *  Hypertension continue home meds  *  Chronic hypercarbic respiratory failure secondary to COPD/suspected obesity hypoventilation syndrome/history of sleep apnea not on CPAP -Patient has chronic hypercarbia continue to monitor.  Her mental status is stable.  Continue oxygenation -Nebs as needed  *Anemia of chronic disease is stable  All the records are reviewed and case discussed with Care Management/Social Worker Management plans discussed with the patient, family and they are in agreement.  CODE STATUS: Full.  TOTAL TIME TAKING CARE OF THIS PATIENT: 35 minutes.   D/C home tomorrow with home health  Patient has a bed available at [facility].  But she is choosing to go home with home health.  Neita Carp M.D on 01/22/2017   Between 7am to 6pm - Pager - [phone]  After 6pm go to [email or web address] - password [email or web address] [facility]  Office  [phone]  CC: Primary care physician; Clovia Cuff, MD  Note: This dictation was prepared with Dragon dictation along with smaller phrase technology. Any transcriptional errors that result from this process are unintentional.

## 2017-01-22 NOTE — Progress Notes (Signed)
OT Cancellation Note  Patient Details Name: Loretta Wade MRN: [id number] DOB: 1950-11-12   Cancelled Treatment:    Reason Eval/Treat Not Completed: Patient declined, no reason specified. Upon attempt, pt reporting pain, discomfort, just called RN who was going to inform the MD. Pt requesting OT come back next date. Will re-attempt next date as pt is available.  Jeni Salles, MPH, MS, OTR/L ascom [phone] 01/22/17, 2:22 PM

## 2017-01-22 NOTE — Progress Notes (Signed)
Patient 66 year old female with hx of MO, chronic respiratory failure sedondary to CHF and COPD on 2 liters oxygen via [place], chronic hypercarbia, OSA, not on CPAP.   Patient admitted with dx of acute on chronic CHF, UTI, HTN, chronic hypercarbic respiratory failure secondary to COPD, MO, OSA.  BNP was 75 on admission.    Patient stated she presented to the ER with worsening SOB, swelling in his feet and ankles, and generalized weakness.    CHF Education:  Educational session with patient completed. Patient lying in bed with HOB elevated watching TV.  ? ? Provided patient with "Living Better with Heart Failure" packet. Briefly reviewed definition of heart failure and signs and symptoms of an exacerbation. Discussed the meaning of EF with patient.?Patient's last echo was 10/07/2016 and EF was 55% at that time.  Explained to patient HF is a chronic condition which must be self-assessed/self-managed by patient with the help of the cardiologist, PCP, and / or HF Clinic in order to maximize her condition and keep her out of the hospital.     *Reviewed importance of and reason behind checking weight daily in the AM, after using the bathroom, but before getting dressed. Patient has scales.?Patient stated she does have scales at home, but has not been weighing herself. Note:  Disposition has not been decided.  Informed patient that if she should go to a rehabilitation facility, then she needs to be weighed daily at facility and if she returns home she needs to weigh herself at home every morning.  Patient verbalized understanding.    Reviewed the following information with patient:  *Discussed when to call the Dr= weight gain of >2lb overnight of 5lb in a week,  *Discussed yellow zone= call MD: weight gain of >2lb overnight of 5lb in a week, increased swelling, increased SOB when lying down, chest discomfort, dizziness, increased fatigue. ?? *Red Zone= call 911: struggle to breath, fainting or near fainting,  significant chest pain.  Patient was able to repeat this back to me.? ? *Reviewed low sodium diet-provided handout of recommended and not recommended foods. Reviewed reading labels with patient. Discussed fluid intake with patient as well. Patient currently on a fluid restriction of 1800 ml/24 hour. ?Patient did state that she does not add salt to his foods and eats foods low in salt as much as possible.  Patient stated different people / family members prepare her food or they will bring in food from outside.  Dietitian Consultation pending.    *Instructed patient to take medications as prescribed for heart failure. Explained briefly why pt is on the medications (either make you feel better, live longer or keep you out of the hospital) and discussed monitoring and side effects.  ? *Discussed exercise. Patient is limited with mobility at this time secondary to SOB.  Patient will need PT. ? ? *Smoking Cessation?- patient former smoker. ?Patient informed this RN she quit smoking 15 years ago.  ? ? Patient is not established patient of [facility].?Explained the role of the [facility] to patient.  [place] [facility] new patient appointment scheduled for February 06, 2017 at 12 noon.??? ? Roanna Epley, RN, BSN, [facility]? [facility]  Cardiovascular &?Pulmonary Nurse Navigator  Direct Line: [phone]  Department Phone #: [phone] Fax: [phone]? Email Address: [email or web address]

## 2017-01-22 NOTE — Plan of Care (Signed)
  Activity: Ability to tolerate increased activity will improve 01/22/2017 1440 - Not Progressing by Daron Offer, RN Note Patient refused O.T. earlier today d/t reports of "pain". Patient medicated for pain the hour prior. Will continue to monitor. Wenda Low [facility]

## 2017-01-22 NOTE — Care Management (Signed)
Informed during progression that patient is now choosing to go home rather than skilled nursing.  Spoke with patient and this is not the case.  She continues to desire to go to skilled nursing at discharge. Patient has external cath in place and not out of bed.  Discussed with primary nurse of the need to remove the cath to encourage mobility as it is activity limiting unless there ar other medical issues warranting continued use of the external cath

## 2017-01-23 LAB — BASIC METABOLIC PANEL
Anion gap: 11 (ref 5–15)
BUN: 13 mg/dL (ref 6–20)
CALCIUM: 9.5 mg/dL (ref 8.9–10.3)
CHLORIDE: 95 mmol/L — AB (ref 101–111)
CO2: 33 mmol/L — AB (ref 22–32)
CREATININE: 0.79 mg/dL (ref 0.44–1.00)
GFR calc non Af Amer: >60 mL/min (ref >60)
Glucose, Bld: 137 mg/dL — ABNORMAL HIGH (ref 65–99)
Potassium: 3.7 mmol/L (ref 3.5–5.1)
Sodium: 139 mmol/L (ref 135–145)

## 2017-01-23 LAB — AMMONIA: Ammonia: 32 umol/L (ref 9–35)

## 2017-01-23 MED ORDER — POLYETHYLENE GLYCOL 3350 17 G PO PACK: 17.0000 g | PACK | Freq: Every day | ORAL | 0 refills | Status: DC

## 2017-01-23 NOTE — Care Management (Signed)
CM has very firm conversation with patient regarding lack of motivation to participate with physical therapy.  Discussed insurance will not approve skilled nursing placement if she does not participate with therapy.  Discussed if insurance authorization is not obtained she would discharge home tomorrow with home health and be placed from home.  Patient verbalized understanding of this conversation.

## 2017-01-23 NOTE — Care Management Important Message (Signed)
Important Message  Patient Details  Name: KAMSIYOCHUKWU BUIST MRN: [id number] Date of Birth: February 27, 1951   Medicare Important Message Given:  Yes    Katrina Stack, RN 01/23/2017, 10:14 AM

## 2017-01-23 NOTE — Clinical Social Work Note (Signed)
CSW met with patient and she is agreeable to going to [facility] SNF now for short term rehab.  CSW faxed required information to patient's insurance company.  CSW is awaiting for approval for patient to go to SNF.    Jones Broom. [place], MSW, [place]  01/23/2017 1:33 PM

## 2017-01-23 NOTE — Clinical Social Work Note (Signed)
CSW spoke to [employer] company any they are requesting PT to see her again.  Patient has not been out of bed per PT note, and insurance company is requesting note that patient is participating and getting out of bed.  CSW contacted PT, who will try to see patient tomorrow.    Jones Broom. [place], MSW, [place]  01/23/2017 4:58 PM

## 2017-01-23 NOTE — Discharge Summary (Addendum)
[facility] [place] NAME: Loretta Wade    MR#:  [id number]  DATE OF BIRTH:  [birth year]  DATE OF ADMISSION:  01/18/2017 ADMITTING PHYSICIAN: Fritzi Mandes, MD  DATE OF DISCHARGE: 01/28/2017  PRIMARY CARE PHYSICIAN: Clovia Cuff, MD   ADMISSION DIAGNOSIS:  Hypercarbia [R06.89] Acute on chronic systolic congestive heart failure (HCC) [I50.23] Acute cystitis without hematuria [N30.00] Chronic obstructive pulmonary disease, unspecified COPD type ([place]) [J44.9]  DISCHARGE DIAGNOSIS:  Active Problems:   CHF (congestive heart failure) ([place])   SECONDARY DIAGNOSIS:   Past Medical History:  Diagnosis Date  . Anemia   . CHF (congestive heart failure) ([place])   . Chronic back pain   . COPD (chronic obstructive pulmonary disease) (HCC)    on 2L home o2  . Edema   . Esophageal reflux   . Fatigue   . Fibromyalgia    Following with pain management  . Hypertension   . Nocturia   . OA (osteoarthritis)   . Sleep apnea    not on CPAP     ADMITTING HISTORY  HISTORY OF PRESENT ILLNESS:  Loretta Wade  is a 66 y.o. female with a known history of morbid obesity, chronic respiratory failure secondary to CHF and COPD on 2 L nasal cannula oxygen, chronic hypercarbia, sleep apnea not on CPAP comes to the emergency room with increasing weakness secondary to diarrhea.  Patient said about a week ago she had constipation.  She does not remember taking anything for treatment of it started having a runny stools.  She was unable to manage herself at home continued to feel generalized weakness came to the emergency room with increasing shortness of breath and was found to have mild congestive heart failure//vascular congestion She was also found to have UTI. Patient is being admitted for further eval check management. Her BNP was 73.  Chest x-ray showed fluid in the lingula right and mild vascular congestion.  She received 40 of Lasix in the  ER.     HOSPITAL COURSE:   DeborahCoachmanis a30 y.o.femalewith a known history of morbid obesity, chronic respiratory failure secondary to CHF and COPD on 2 L nasal cannula oxygen, chronic hypercarbia,sleep apnea not on CPAP comes to the emergency room with increasing weakness, shortness of breath  *Acute on chronic diastolic congestive heart failure - IV Lasix with good diuresis. Change to PO lasix 40 BID - Input and Output - Counseled to limit fluids and Salt - Echo-reviewed. EF 55-60% -Cardiology follow up after discharge   *UTI -IV Rocephin. Finished abx in hospital  *Constipation.  Added scheduled MiraLAX. Resolved. Continue after discharge  * Hypertension continue home meds  * Chronic hypercarbic respiratory failure secondary to COPD/suspected obesity hypoventilation syndrome/history of sleep apnea not on CPAP -Patient has chronic hypercarbia continue to monitor. Her mental status is stable. Continue oxygenation -Nebs as needed  *Anemia of chronic disease is stable  Stable for discharge to SNF for PT/OT  Needs to be on daily fluids < 2 liters Low salt diet  CONSULTS OBTAINED:  PT OT  DRUG ALLERGIES:   Allergies  Allergen Reactions  . Gabapentin     Pt reports dizziness and abnormal urine labs.  . Tramadol Nausea And Vomiting    DISCHARGE MEDICATIONS:   Current Discharge Medication List    START taking these medications   Details  polyethylene glycol (MIRALAX / GLYCOLAX) packet Take 17 g daily by mouth. Qty: 14 each, Refills: 0  CONTINUE these medications which have NOT CHANGED   Details  albuterol (PROVENTIL) (2.5 MG/3ML) 0.083% nebulizer solution USE ONE TREATMENT EVERY 6 HOURS AS NEEDED FOR WHEEZING OR SHORTNESS OF BREATH Qty: 360 mL, Refills: 0    cholecalciferol (VITAMIN D) 1000 UNITS tablet Take 1,000 Units by mouth daily.     citalopram (CELEXA) 20 MG tablet TAKE ONE (1) TABLET BY MOUTH EVERY DAY Qty: 30 tablet,  Refills: 0    colesevelam (WELCHOL) 625 MG tablet TAKE ONE TABLET TWICE A DAY WITH FOOD Qty: 60 tablet, Refills: 0   Associated Diagnoses: Dyslipidemia    cyanocobalamin 500 MCG tablet Take 500 mcg by mouth daily.    dexlansoprazole (DEXILANT) 60 MG capsule TAKE ONE (1) CAPSULE EACH DAY Qty: 30 capsule, Refills: 0    furosemide (LASIX) 40 MG tablet Take 1 tablet (40 mg total) by mouth 2 (two) times daily. Qty: 30 tablet, Refills: 60    Magnesium 250 MG TABS Take 250 mg by mouth daily.    nystatin (MYCOSTATIN) 100000 UNIT/ML suspension Take 5 mLs (500,000 Units total) by mouth 4 (four) times daily. Qty: 180 mL, Refills: 0   Associated Diagnoses: Oral thrush    oxybutynin (DITROPAN) 5 MG tablet TAKE ONE (1) TABLET BY MOUTH EVERY DAY Qty: 30 tablet, Refills: 0   Associated Diagnoses: Overflow stress incontinence of urine in female    pregabalin (LYRICA) 150 MG capsule Take 1 capsule (150 mg total) by mouth 2 (two) times daily. Qty: 60 capsule, Refills: 0   Associated Diagnoses: Fibromyalgia    rOPINIRole (REQUIP) 0.25 MG tablet Take 3 tablets (0.75 mg total) by mouth at bedtime. Qty: 90 tablet, Refills: 0   Associated Diagnoses: RLS (restless legs syndrome)    acetaminophen (TYLENOL) 325 MG tablet Take 2 tablets (650 mg total) by mouth every 6 (six) hours as needed for mild pain (or Fever >/= 101). Qty: 30 tablet, Refills: 0    ANORO ELLIPTA 62.5-25 MCG/INH AEPB Inhale 1 puff into the lungs daily.    chlorpheniramine-HYDROcodone (TUSSIONEX) 10-8 MG/5ML SUER Take 5 mLs by mouth every 12 (twelve) hours as needed for cough. Qty: 115 mL, Refills: 0    fluticasone (FLONASE) 50 MCG/ACT nasal spray USE TWO SPRAYS IN EACH NOSTRIL EVERY DAY Qty: 16 g, Refills: 0   Associated Diagnoses: Chronic nasal congestion    guaiFENesin (MUCINEX) 600 MG 12 hr tablet Take 1 tablet (600 mg total) by mouth 2 (two) times daily. Qty: 20 tablet, Refills: 0    ipratropium-albuterol (DUONEB) 0.5-2.5  (3) MG/3ML SOLN Take 3 mLs by nebulization every 6 (six) hours as needed. Qty: 360 mL, Refills: 0    loperamide (IMODIUM) 2 MG capsule Take 2-4 mg by mouth as needed for diarrhea or loose stools.    mouth rinse LIQD solution 15 mLs by Mouth Rinse route 2 (two) times daily. Qty: 354 mL, Refills: 0      STOP taking these medications     predniSONE (STERAPRED UNI-PAK 21 TAB) 10 MG (21) TBPK tablet         Today   VITAL SIGNS:  Blood pressure 121/60, pulse 100, temperature 98.4 F (36.9 C), temperature source Oral, resp. rate 14, height 5\' 4"  (1.626 m), weight (!) 157.9 kg (348 lb), SpO2 96 %.  I/O:    Intake/Output Summary (Last 24 hours) at 01/27/2017 1349 Last data filed at 01/27/2017 1034 Gross per 24 hour  Intake 840 ml  Output 0 ml  Net 840 ml    PHYSICAL  EXAMINATION:  Physical Exam  GENERAL:  66 y.o.-year-old patient lying in the bed , morbidly obese LUNGS: Normal breath sounds bilaterally, no wheezing, rales,rhonchi or crepitation. No use of accessory muscles of respiration.  CARDIOVASCULAR: S1, S2 normal. No murmurs, rubs, or gallops.  ABDOMEN: Soft, non-tender, non-distended. Bowel sounds present. No organomegaly or mass.  NEUROLOGIC: Moves all 4 extremities. PSYCHIATRIC: The patient is alert and oriented x 3.  SKIN: No obvious rash, lesion, or ulcer.   DATA REVIEW:   CBC No results for input(s): WBC, HGB, HCT, PLT in the last 168 hours.  Chemistries  Recent Labs  Lab 01/22/17 1106 01/23/17 0249 01/25/17 0424  NA 138 139  --   K 4.2 3.7  --   CL 93* 95*  --   CO2 34* 33*  --   GLUCOSE 184* 137*  --   BUN 12 13  --   CREATININE 0.87 0.79 0.77  CALCIUM 9.3 9.5  --   MG 2.3  --   --     Follow-up Information    [facility] Follow up on 02/06/2017.   Specialty:  Cardiology Why:  at 12:00 noon Contact information: [address] [place] [phone]        Clovia Cuff, MD. Schedule an appointment as soon as possible for a visit in 1 week(s).   Specialty:  Internal Medicine Contact information: [address] [place] [address] [phone]           Follow up with PCP in 1 week.  Management plans discussed with the patient, family and they are in agreement.  CODE STATUS:     Code Status Orders  (From admission, onward)        Start     Ordered   01/18/17 1505  Full code  Continuous     01/18/17 1504    Code Status History    Date Active Date Inactive Code Status Order ID Comments User Context   11/25/2016 01:38 11/28/2016 21:27 Full Code [id number]  Lance Coon, MD Inpatient   10/03/2016 11:43 10/12/2016 00:56 Full Code [id number]  Nicholes Mango, MD Inpatient   11/01/2015 09:21 11/01/2015 13:03 Full Code [id number]  Harrie Foreman, MD Inpatient   07/11/2015 16:44 07/14/2015 20:37 Full Code [id number]  Gladstone Lighter, MD Inpatient   05/12/2015 18:04 05/16/2015 19:58 Full Code [id number]  Demetrios Loll, MD Inpatient   04/12/2015 17:08 04/15/2015 18:04 Full Code [id number]  Lytle Butte, MD ED   01/28/2015 08:08 02/08/2015 14:34 Full Code [id number]  Loletha Grayer, MD ED   11/13/2014 13:02 11/19/2014 16:39 Full Code [id number]  Theodoro Grist, MD Inpatient   11/04/2014 17:50 11/08/2014 16:37 Full Code [id number]  Vaughan Basta, MD Inpatient      TOTAL TIME TAKING CARE OF THIS PATIENT ON DAY OF DISCHARGE: more than 30 minutes.   Leia Alf Sudini M.D on 01/27/2017 at 1:50 PM  Between 7am to 6pm - Pager - [phone]  After 6pm go to [email or web address] - password [email or web address] [facility]  Office  [phone]  CC: Primary care physician; Clovia Cuff, MD  Note: This dictation was prepared with Dragon dictation along with smaller phrase technology. Any transcriptional errors that result from this process are unintentional.

## 2017-01-23 NOTE — Progress Notes (Signed)
Nutrition Education Note  RD consulted for nutrition education regarding new onset CHF.  66 y.o.femalewith a known history of morbid obesity, chronic respiratory failure secondary to CHF and COPD on 2 L nasal cannula oxygen, chronic hypercarbia,sleep apnea not on CPAP comes to the emergency room with increasing weakness, shortness of breath admitted for CHF  RD provided "Low Sodium Nutrition Therapy" handout from the Academy of [employer]. Reviewed patient's dietary recall. Provided examples on ways to decrease sodium intake in diet. Discouraged intake of processed foods and use of salt shaker. Encouraged fresh fruits and vegetables as well as whole grain sources of carbohydrates to maximize fiber intake.   RD discussed why it is important for patient to adhere to diet recommendations, and emphasized the role of fluids, foods to avoid, and importance of weighing self daily. Teach back method used.  Expect poor compliance.  Body mass index is 60.25 kg/m. Pt meets criteria for morbid obesity based on current BMI.  Current diet order is heart healthy, patient is consuming approximately 100% of meals at this time. Labs and medications reviewed. No further nutrition interventions warranted at this time. RD contact information provided. If additional nutrition issues arise, please re-consult RD.   Koleen Distance MS, RD, LDN Pager #- [phone] After Hours Pager: [phone]

## 2017-01-23 NOTE — Progress Notes (Signed)
Pt called RN, stated she coughed and has a pain running to her arm and she has a lump and it is cancer she can not go home with all these problems.  Went in and reassured pt that she did not have cancer under her arm that she was fine for dc today.  Will monitor.

## 2017-01-23 NOTE — Plan of Care (Signed)
  Progressing Education: Ability to verbalize understanding of medication therapies will improve 01/23/2017 1234 - Progressing by Etheleen Nicks, RN Note IV Lasix, daily weights, Intake and output   Not Progressing Activity: Ability to implement measures to reduce episodes of fatigue will improve 01/23/2017 1234 - Not Progressing by Etheleen Nicks, RN Note Pt working with Physical therapy, recommended SNF Ability to tolerate increased activity will improve 01/23/2017 1234 - Not Progressing by Etheleen Nicks, RN Respiratory: Levels of oxygenation will improve 01/23/2017 1234 - Not Progressing by Etheleen Nicks, RN Note Remains on 2LO2 per [place], chronic use for pt Pain Management: Expressions of feelings of enhanced comfort will increase 01/23/2017 1234 - Not Progressing by Etheleen Nicks, RN Note PRN medications Clinical Measurements: Will remain free from infection 01/23/2017 1234 - Not Progressing by Etheleen Nicks, RN Note Remains on IV antibiotics for UTI

## 2017-01-23 NOTE — Progress Notes (Signed)
Pt has called multiple times today with concerns RE: she isnt ready for discharge.  Stated earlier her legs were burning, lotion applied to her legs and Dr. Darvin Neighbours notified and spoke with pt.  Pt called again 2 hours later asking to speak with dr, she isnt ready for discharge, reassured pt that she was medically clear for dc, we did not have a reason to keep her over night.  Dr. Darvin Neighbours on floor, to go speak to pt.

## 2017-01-24 MED ORDER — PREDNISONE 50 MG PO TABS
50.0000 mg | ORAL_TABLET | Freq: Every day | ORAL | Status: DC
  Administered 2017-01-24 – 2017-01-28 (×5): 50 mg via ORAL
  Filled 2017-01-24 (×5): qty 1

## 2017-01-24 MED ORDER — FUROSEMIDE 40 MG PO TABS
40.0000 mg | ORAL_TABLET | Freq: Two times a day (BID) | ORAL | Status: DC
  Administered 2017-01-24 – 2017-01-28 (×8): 40 mg via ORAL
  Filled 2017-01-24 (×8): qty 1

## 2017-01-24 MED ORDER — IPRATROPIUM-ALBUTEROL 0.5-2.5 (3) MG/3ML IN SOLN
3.0000 mL | Freq: Four times a day (QID) | RESPIRATORY_TRACT | Status: DC
  Administered 2017-01-24 – 2017-01-28 (×16): 3 mL via RESPIRATORY_TRACT
  Filled 2017-01-24 (×15): qty 3

## 2017-01-24 NOTE — Care Management (Addendum)
At time of this note, have not received determination for skilled nursing stay for this patient.  Discussed with patient if [employer] denied the stay, CM has spoken with [facility] and agency knows to follow to resume services.  Patient says will require ems transport home and non emergent form completed. Patient says that there is no way she can go home and take care of herself.  CM discussed that through out this stay she has requested to go home not go to skilled nursing. Asked how was she going to care for herself and responded- "well then they said I could go to skilled nursing."   Two grandsons are in the room and CM explained the current situation. Patient says that she is not well enough to discharge now and she requests to appeal discharge. Provided patient with the contact information for [facility]  notified administration.  [facility] was closed for the day and patient left them a voicemail message

## 2017-01-24 NOTE — Clinical Social Work Note (Addendum)
CSW received phone call from [place] at [facility], she is requesting an updated PT note for patient from today.  CSW faxed requested PT note, awaiting for insurance authorization.  3:45pm  CSW attempted to call insurance company to get status on authorization, insurance company said they are still reviewing.  6:00pm  CSW was informed that patient is appealing her discharge, care manager aware.  Jones Broom. Norval Morton, MSW, [place]  01/24/2017 12:33 PM

## 2017-01-24 NOTE — Progress Notes (Signed)
[facility] [place] NAME: Loretta Wade    MR#:  [id number]  DATE OF BIRTH:  1951-02-11  SUBJECTIVE:  CHIEF COMPLAINT:   Chief Complaint  Patient presents with  . Failure To Thrive   SOB improved. Has some burning in legs. Feels weak  REVIEW OF SYSTEMS:   Constitutional: Negative for chills, fever and weight loss.  HENT: Negative for ear discharge, ear pain and nosebleeds.   Eyes: Negative for blurred vision, pain and discharge.  Respiratory: Positive for shortness of breath. Negative for sputum production, wheezing and stridor.   Cardiovascular: Negative for chest pain, palpitations, orthopnea and PND.  Gastrointestinal: Positive for diarrhea. Negative for abdominal pain, nausea and vomiting.  Genitourinary: Positive for dysuria. Negative for frequency and urgency.  Musculoskeletal: Positive for back pain. Negative for joint pain.  Neurological: Positive for weakness. Negative for sensory change, speech change and focal weakness.  Psychiatric/Behavioral: Negative for depression and hallucinations. The patient is not nervous/anxious.      ROS  DRUG ALLERGIES:   Allergies  Allergen Reactions  . Gabapentin     Pt reports dizziness and abnormal urine labs.  . Tramadol Nausea And Vomiting    VITALS:  Blood pressure 135/80, pulse (!) 116, temperature 98.4 F (36.9 C), temperature source Oral, resp. rate 20, height 5\' 4"  (1.626 m), weight (!) 157.9 kg (348 lb), SpO2 95 %.  PHYSICAL EXAMINATION:   GENERAL:  66 y.o.-year-old patient lying in the bed with no acute distress. Morbidly obese EYES: Pupils equal, round, reactive to light and accommodation. No scleral icterus. Extraocular muscles intact.  HEENT: Head atraumatic, normocephalic. Oropharynx and nasopharynx clear.  NECK:  Supple, no jugular venous distention. No thyroid enlargement, no tenderness.  LUNGS: Normal breath sounds bilaterally, no wheezing, rales,rhonchi or  crepitation. No use of accessory muscles of respiration. Decreased bs bases CARDIOVASCULAR: S1, S2 normal. No murmurs, rubs, or gallops.  ABDOMEN: Soft, nontender, nondistended. Bowel sounds present. No organomegaly or mass. obese EXTREMITIES:pedal edema, no cyanosis, or clubbing.  NEUROLOGIC: Cranial nerves II through XII are intact. Muscle strength 5/5 in all extremities. Sensation intact. Gait not checked.  PSYCHIATRIC: The patient is alert and oriented x 3.  SKIN: No obvious rash, lesion, or ulcer.    Physical Exam LABORATORY PANEL:   CBC Recent Labs  Lab 01/18/17 1142  WBC 6.1  HGB 10.8*  HCT 35.2  PLT 196   ------------------------------------------------------------------------------------------------------------------  Chemistries  Recent Labs  Lab 01/18/17 1142  01/22/17 1106 01/23/17 0249  NA 140   < > 138 139  K 3.9   < > 4.2 3.7  CL 94*   < > 93* 95*  CO2 35*   < > 34* 33*  GLUCOSE 113*   < > 184* 137*  BUN 14   < > 12 13  CREATININE 0.75   < > 0.87 0.79  CALCIUM 9.2   < > 9.3 9.5  MG  --   --  2.3  --   AST 18  --   --   --   ALT 19  --   --   --   ALKPHOS 83  --   --   --   BILITOT 0.9  --   --   --    < > = values in this interval not displayed.   ------------------------------------------------------------------------------------------------------------------  Cardiac Enzymes Recent Labs  Lab 01/18/17 1142  TROPONINI <0.03   ------------------------------------------------------------------------------------------------------------------  RADIOLOGY:  No results found.  ASSESSMENT AND PLAN:   Active Problems:   CHF (congestive heart failure) (HCC)  Loretta Wade  is a 66 y.o. female with a known history of morbid obesity, chronic respiratory failure secondary to CHF and COPD on 2 L nasal cannula oxygen, chronic hypercarbia, sleep apnea not on CPAP comes to the emergency room with increasing weakness, shortness of breath  *Acute on  chronic diastolic congestive heart failure - IV Lasix. Change to PO today - Input and Output - Counseled to limit fluids and Salt - Monitor Bun/Cr and Potassium - Echo-reviewed -Cardiology follow up after discharge Diamox given due to alkalosis. Scheduled nebs  * UTI -Finished Abx  *Constipation.  Added scheduled MiraLAX  *  Hypertension continue home meds  *  Chronic hypercarbic respiratory failure secondary to COPD/suspected obesity hypoventilation syndrome/history of sleep apnea not on CPAP -Patient has chronic hypercarbia continue to monitor.  Her mental status is stable.  Continue oxygenation -Nebs as needed  *Anemia of chronic disease is stable  All the records are reviewed and case discussed with Care Management/Social Worker Management plans discussed with the patient, family and they are in agreement.  CODE STATUS: Full.  TOTAL TIME TAKING CARE OF THIS PATIENT: 35 minutes.   Neita Carp M.D on 01/24/2017   Between 7am to 6pm - Pager - [phone]  After 6pm go to [email or web address] - password [email or web address] [facility]  Office  [phone]  CC: Primary care physician; Clovia Cuff, MD  Note: This dictation was prepared with Dragon dictation along with smaller phrase technology. Any transcriptional errors that result from this process are unintentional.

## 2017-01-24 NOTE — Evaluation (Addendum)
Physical Therapy Treatment Patient Details Name: Loretta Wade MRN: [id number] DOB: 10/13/1950 Today's Date: 01/24/2017   History of Present Illness  Pt is a 66 y.o. female who was admitted to the [facility] with diarrhea, abdominal pain, possible UTI, incontinence, and SOB.  Pt  has acute on chronic diastolic CHF, UTI, diarrhea, htn, chronic hypercarbic respiratory failure secondary to COPD/suspected obesity hypoventilation,  Pt. PMHx includes: COPD (on 2L home O2), CHF, chronic back pain, fibromyalgia, R TKA.  Clinical Impression  Pt very agreeable to PT session today and getting OOB.  Pt noted with increased effort and time but able to sit on edge of bed with CGA for safety (pt required sitting rest break d/t SOB).  Pt unable to stand with 1 assist from bed (and use of bariatric RW) but with bed height elevated about 2 inches pt stood with mod to max assist x1 using RW.  Pt able to stand for approximately 30 seconds with RW and able to take 2 steps with R LE and 2 steps with L LE in place before needing to sit down d/t SOB (O2 sats 94% or greater on 3 L O2 via nasal cannula; pt's HR was 110 bpm at rest beginning of session and increased to 153 bpm post ambulation trial; HR decreased down to 130's bpm resting sitting edge of bed and then back down to around 110 bpm end of session resting in bed).  Pt currently demonstrating SOB, generalized weakness, and deconditioning and would benefit from STR to increase strength and independence towards prior level of function.  Will continue to focus on strengthening and progressing functional mobility during hospitalization.    Follow Up Recommendations SNF    Equipment Recommendations  Rolling walker with 5" wheels;Wheelchair (measurements PT)(pt has manual w/c at home)    Recommendations for Other Services       Precautions / Restrictions Precautions Precautions: Fall Restrictions Weight Bearing Restrictions: No      Mobility  Bed  Mobility Overal bed mobility: Needs Assistance Bed Mobility: Supine to Sit;Sit to Supine     Supine to sit: Min guard     General bed mobility comments: CGA supine to sit with HOB elevated and increased effort and time to perform on own; sit to supine 3 assist for trunk and B LE's and to scoot up in bed  Transfers Overall transfer level: Needs assistance Equipment used: (Bariatric RW) Transfers: Sit to/from Stand Sit to Stand: Mod assist;Max assist         General transfer comment: 1st trial (bed in lowest normal position) pt unable to stand with 1 assist; 2nd trial pt stood (bed height elevated 2 inches) with mod to max assist x1 to initiate stand and come to full upright posture using RW (vc's for UE and LE placement)  Ambulation/Gait Ambulation/Gait assistance: Mod assist   Assistive device: (Bariatric RW)   Gait velocity: decreased   General Gait Details: pt able to take 2 steps on R and 2 steps on L using bariatric RW; decreased B foot clearance but able to clear floor each step  Stairs            Wheelchair Mobility    Modified Rankin (Stroke Patients Only)       Balance Overall balance assessment: Needs assistance Sitting-balance support: No upper extremity supported;Feet supported Sitting balance-[name] Scale: Good Sitting balance - Comments: sitting reaching within BOS   Standing balance support: Bilateral upper extremity supported(on bariatric RW) Standing balance-[name] Scale: Poor Standing balance  comment: requires UE support for static standing balance                             Pertinent Vitals/Pain Pain Assessment: 0-10 Pain Score: 5  Pain Location: chronic pain "all over" Pain Descriptors / Indicators: Sore;Tender;Constant Pain Intervention(s): Limited activity within patient's tolerance;Monitored during session;Repositioned    Home Living                        Prior Function                 Hand Dominance         Extremity/Trunk Assessment                Communication      Cognition Arousal/Alertness: Awake/alert Behavior During Therapy: WFL for tasks assessed/performed Overall Cognitive Status: Within Functional Limits for tasks assessed                                        General Comments General comments (skin integrity, edema, etc.): Pt in bariatric rotating bed.  Nursing cleared pt for participation in physical therapy.  Pt agreeable to PT session.    Exercises     Assessment/Plan    PT Assessment Patient needs continued PT services  PT Problem List Decreased strength;Decreased activity tolerance;Decreased mobility;Cardiopulmonary status limiting activity;Pain;Obesity       PT Treatment Interventions DME instruction;Gait training;Functional mobility training;Therapeutic activities;Therapeutic exercise;Balance training;Patient/family education    PT Goals (Current goals can be found in the Care Plan section)  Acute Rehab PT Goals Patient Stated Goal: to improve strength PT Goal Formulation: With patient Time For Goal Achievement: 02/02/17 Potential to Achieve Goals: Fair    Frequency Min 2X/week   Barriers to discharge Decreased caregiver support      Co-evaluation               AM-PAC PT "6 Clicks" Daily Activity  Outcome Measure Difficulty turning over in bed (including adjusting bedclothes, sheets and blankets)?: Unable Difficulty moving from lying on back to sitting on the side of the bed? : Unable Difficulty sitting down on and standing up from a chair with arms (e.g., wheelchair, bedside commode, etc,.)?: Unable Help needed moving to and from a bed to chair (including a wheelchair)?: Total Help needed walking in hospital room?: Total Help needed climbing 3-5 steps with a railing? : Total 6 Click Score: 6    End of Session Equipment Utilized During Treatment: Oxygen;Gait belt(3 L O2 via nasal cannula) Activity Tolerance:  Patient tolerated treatment well;Other (comment) Patient left: in bed;with call bell/phone within reach;with bed alarm set Nurse Communication: Mobility status;Precautions(Pt's HR and O2 status during session) PT Visit Diagnosis: Other abnormalities of gait and mobility (R26.89);Muscle weakness (generalized) (M62.81)    Time: 1505-6979 PT Time Calculation (min) (ACUTE ONLY): 38 min   Charges:     PT Treatments $Therapeutic Activity: 38-52 mins   PT G CodesLeitha Bleak, PT 01/24/17, 12:12 PM [phone]

## 2017-01-25 LAB — CREATININE, SERUM
CREATININE: 0.77 mg/dL (ref 0.44–1.00)
GFR calc non Af Amer: >60 mL/min (ref >60)

## 2017-01-25 MED ORDER — POLYETHYLENE GLYCOL 3350 17 G PO PACK
17.0000 g | PACK | Freq: Two times a day (BID) | ORAL | Status: DC
  Administered 2017-01-25: 17 g via ORAL
  Filled 2017-01-25 (×4): qty 1

## 2017-01-25 MED ORDER — LACTULOSE 10 GM/15ML PO SOLN
30.0000 g | Freq: Once | ORAL | Status: AC
  Administered 2017-01-25: 30 g via ORAL
  Filled 2017-01-25: qty 60

## 2017-01-25 NOTE — Progress Notes (Signed)
[place] at [place] NAME: Loretta Wade    MR#:  [id number]  DATE OF BIRTH:  12-24-1950  SUBJECTIVE:  CHIEF COMPLAINT:   Chief Complaint  Patient presents with  . Failure To Thrive   SOB improved. Feels weak. Cough.  REVIEW OF SYSTEMS:   Constitutional: Negative for chills, fever and weight loss.  HENT: Negative for ear discharge, ear pain and nosebleeds.   Eyes: Negative for blurred vision, pain and discharge.  Respiratory: Positive for shortness of breath. Negative for sputum production, wheezing and stridor.   Cardiovascular: Negative for chest pain, palpitations, orthopnea and PND.  Gastrointestinal: Positive for diarrhea. Negative for abdominal pain, nausea and vomiting.  Genitourinary: Positive for dysuria. Negative for frequency and urgency.  Musculoskeletal: Positive for back pain. Negative for joint pain.  Neurological: Positive for weakness. Negative for sensory change, speech change and focal weakness.  Psychiatric/Behavioral: Negative for depression and hallucinations. The patient is not nervous/anxious.    ROS  DRUG ALLERGIES:   Allergies  Allergen Reactions  . Gabapentin     Pt reports dizziness and abnormal urine labs.  . Tramadol Nausea And Vomiting    VITALS:  Blood pressure 134/60, pulse (!) 117, temperature 98.7 F (37.1 C), temperature source Oral, resp. rate 18, height 5\' 4"  (1.626 m), weight (!) 158.3 kg (349 lb), SpO2 90 %.  PHYSICAL EXAMINATION:   GENERAL:  66 y.o.-year-old patient lying in the bed with no acute distress. Morbidly obese EYES: Pupils equal, round, reactive to light and accommodation. No scleral icterus. Extraocular muscles intact.  HEENT: Head atraumatic, normocephalic. Oropharynx and nasopharynx clear.  NECK:  Supple, no jugular venous distention. No thyroid enlargement, no tenderness.  LUNGS: Normal breath sounds bilaterally, no wheezing, rales,rhonchi or crepitation. No use of  accessory muscles of respiration. Decreased bs bases CARDIOVASCULAR: S1, S2 normal. No murmurs, rubs, or gallops.  ABDOMEN: Soft, nontender, nondistended. Bowel sounds present. No organomegaly or mass. obese EXTREMITIES:pedal edema, no cyanosis, or clubbing.  NEUROLOGIC: Cranial nerves II through XII are intact. Muscle strength 5/5 in all extremities. Sensation intact. Gait not checked.  PSYCHIATRIC: The patient is alert and oriented x 3.  SKIN: No obvious rash, lesion, or ulcer.   Physical Exam LABORATORY PANEL:   CBC No results for input(s): WBC, HGB, HCT, PLT in the last 168 hours. ------------------------------------------------------------------------------------------------------------------  Chemistries  Recent Labs  Lab 01/22/17 1106 01/23/17 0249 01/25/17 0424  NA 138 139  --   K 4.2 3.7  --   CL 93* 95*  --   CO2 34* 33*  --   GLUCOSE 184* 137*  --   BUN 12 13  --   CREATININE 0.87 0.79 0.77  CALCIUM 9.3 9.5  --   MG 2.3  --   --    ------------------------------------------------------------------------------------------------------------------  Cardiac Enzymes No results for input(s): TROPONINI in the last 168 hours. ------------------------------------------------------------------------------------------------------------------  RADIOLOGY:  No results found.  ASSESSMENT AND PLAN:   Active Problems:   CHF (congestive heart failure) (HCC)  Loretta Wade  is a 66 y.o. female with a known history of morbid obesity, chronic respiratory failure secondary to CHF and COPD on 2 L nasal cannula oxygen, chronic hypercarbia, sleep apnea not on CPAP comes to the emergency room with increasing weakness, shortness of breath  *Acute on chronic diastolic congestive heart failure - IV Lasix. Change to PO today. Input and Output - Counseled to limit fluids and Salt - Echo-reviewed -Cardiology follow up after discharge Scheduled  nebs  * Acute bronchitis ON  prednisone and nebs  * UTI -Finished Abx  *Constipation.  Added scheduled MiraLAX Give a dose of lactulose today  *  Hypertension continue home meds  *  Chronic hypercarbic respiratory failure secondary to COPD/suspected obesity hypoventilation syndrome/history of sleep apnea not on CPAP -Patient has chronic hypercarbia continue to monitor.  Her mental status is stable.  Continue oxygenation -Nebs as needed  *Anemia of chronic disease is stable  All the records are reviewed and case discussed with Care Management/Social Worker Management plans discussed with the patient, family and they are in agreement.  CODE STATUS: Full.  TOTAL TIME TAKING CARE OF THIS PATIENT: 35 minutes.   Neita Carp M.D on 01/25/2017   Between 7am to 6pm - Pager - [phone]  After 6pm go to [email or web address] - password [email or web address] [facility]  Office  [phone]  CC: Primary care physician; Clovia Cuff, MD  Note: This dictation was prepared with Dragon dictation along with smaller phrase technology. Any transcriptional errors that result from this process are unintentional.

## 2017-01-25 NOTE — Progress Notes (Signed)
[place] at [place] NAME: Loretta Wade    MR#:  [id number]  DATE OF BIRTH:  1951/01/07  SUBJECTIVE:  CHIEF COMPLAINT:   Chief Complaint  Patient presents with  . Failure To Thrive   Feels weak and needs SNF. Refused SNF initially and now agreed. Waiting for insurance approval.  REVIEW OF SYSTEMS:   Constitutional: Negative for chills, fever and weight loss.  HENT: Negative for ear discharge, ear pain and nosebleeds.   Eyes: Negative for blurred vision, pain and discharge.  Respiratory: Positive for shortness of breath. Negative for sputum production, wheezing and stridor.   Cardiovascular: Negative for chest pain, palpitations, orthopnea and PND.  Gastrointestinal: Positive for diarrhea. Negative for abdominal pain, nausea and vomiting.  Genitourinary: Positive for dysuria. Negative for frequency and urgency.  Musculoskeletal: Positive for back pain. Negative for joint pain.  Neurological: Positive for weakness. Negative for sensory change, speech change and focal weakness.  Psychiatric/Behavioral: Negative for depression and hallucinations. The patient is not nervous/anxious.    ROS  DRUG ALLERGIES:   Allergies  Allergen Reactions  . Gabapentin     Pt reports dizziness and abnormal urine labs.  . Tramadol Nausea And Vomiting    VITALS:  Blood pressure 134/60, pulse (!) 117, temperature 98.7 F (37.1 C), temperature source Oral, resp. rate 18, height 5\' 4"  (1.626 m), weight (!) 158.3 kg (349 lb), SpO2 90 %.  PHYSICAL EXAMINATION:   GENERAL:  66 y.o.-year-old patient lying in the bed with no acute distress. Morbidly obese EYES: Pupils equal, round, reactive to light and accommodation. No scleral icterus. Extraocular muscles intact.  HEENT: Head atraumatic, normocephalic. Oropharynx and nasopharynx clear.  NECK:  Supple, no jugular venous distention. No thyroid enlargement, no tenderness.  LUNGS: Normal breath sounds  bilaterally, no wheezing, rales,rhonchi or crepitation. No use of accessory muscles of respiration. Decreased bs bases CARDIOVASCULAR: S1, S2 normal. No murmurs, rubs, or gallops.  ABDOMEN: Soft, nontender, nondistended. Bowel sounds present. No organomegaly or mass. obese EXTREMITIES: ++ pedal edema, no cyanosis, or clubbing.  NEUROLOGIC: Cranial nerves II through XII are intact. Muscle strength 5/5 in all extremities. Sensation intact. Gait not checked.  PSYCHIATRIC: The patient is alert and oriented x 3.  SKIN: No obvious rash, lesion, or ulcer.   Physical Exam LABORATORY PANEL:   CBC No results for input(s): WBC, HGB, HCT, PLT in the last 168 hours. ------------------------------------------------------------------------------------------------------------------  Chemistries  Recent Labs  Lab 01/22/17 1106 01/23/17 0249 01/25/17 0424  NA 138 139  --   K 4.2 3.7  --   CL 93* 95*  --   CO2 34* 33*  --   GLUCOSE 184* 137*  --   BUN 12 13  --   CREATININE 0.87 0.79 0.77  CALCIUM 9.3 9.5  --   MG 2.3  --   --    ------------------------------------------------------------------------------------------------------------------  Cardiac Enzymes No results for input(s): TROPONINI in the last 168 hours. ------------------------------------------------------------------------------------------------------------------  RADIOLOGY:  No results found.  ASSESSMENT AND PLAN:   Active Problems:   CHF (congestive heart failure) (HCC)  Loretta Wade  is a 66 y.o. female with a known history of morbid obesity, chronic respiratory failure secondary to CHF and COPD on 2 L nasal cannula oxygen, chronic hypercarbia, sleep apnea not on CPAP comes to the emergency room with increasing weakness, shortness of breath  * Acute on chronic diastolic congestive heart failure - Change to PO lasix - Input and Output - Counseled to  limit fluids and Salt - Monitor Bun/Cr and Potassium -  Echo-reviewed -Cardiology follow up after discharge Diamox due to alkalosis. Now stopped  Will need further inpatient diuresis for one more day  * UTI -IV Rocephin. Last dose today  *Constipation.  Added scheduled MiraLAX  *  Hypertension continue home meds  *  Chronic hypercarbic respiratory failure secondary to COPD/suspected obesity hypoventilation syndrome/history of sleep apnea not on CPAP -Patient has chronic hypercarbia continue to monitor.  Her mental status is stable.  Continue oxygenation -Nebs as needed  *Anemia of chronic disease is stable  All the records are reviewed and case discussed with Care Management/Social Worker Management plans discussed with the patient, family and they are in agreement.  CODE STATUS: Full.  TOTAL TIME TAKING CARE OF THIS PATIENT: 35 minutes.   Neita Carp M.D on 01/25/2017   Between 7am to 6pm - Pager - [phone]  After 6pm go to [email or web address] - password [email or web address] [facility]  Office  [phone]  CC: Primary care physician; Clovia Cuff, MD  Note: This dictation was prepared with Dragon dictation along with smaller phrase technology. Any transcriptional errors that result from this process are unintentional.

## 2017-01-25 NOTE — Care Management (Signed)
Spoke with attending regarding patient's discharge appeal and that patient will either discharge home or to skilled nursing. Patient is appealing her discharge regardless of going to skilled nursing or home with home health.  If patient is stable for discharge, the  discharge order needs to be present due to her appeal.  Patient can change her mind and withdraw but at present the appeal has been initiated

## 2017-01-26 MED ORDER — LACTULOSE 10 GM/15ML PO SOLN: 30.0000 g | Freq: Once | ORAL | Status: DC

## 2017-01-26 NOTE — Progress Notes (Signed)
Occupational Therapy Treatment Patient Details Name: Loretta Wade MRN: [id number] DOB: 11-10-1950 Today's Date: 01/26/2017    History of present illness Pt is a 66 y.o. female who was admitted to the [facility] with diarrhea, abdominal pain, possible UTI, incontinence, and SOB.  Pt  has acute on chronic diastolic CHF, UTI, diarrhea, htn, chronic hypercarbic respiratory failure secondary to COPD/suspected obesity hypoventilation,  Pt. PMHx includes: COPD (on 2L home O2), CHF, chronic back pain, fibromyalgia, R TKA.   OT comments  Pt seen for OT session this date. Pt verbalized understanding of all education provided this date in AE/DME, energy conservation, work simplification, home/routines modifications, and falls prevention strategies. Handout provided from previous date, which pt had not had chance to review on her own. Will continue to progress.   Follow Up Recommendations  SNF    Equipment Recommendations  Other (comment)(TBD at next venue of care)    Recommendations for Other Services      Precautions / Restrictions Precautions Precautions: Fall Restrictions Weight Bearing Restrictions: No       Mobility Bed Mobility               General bed mobility comments: deferred, up in recliner  Transfers                 General transfer comment: deferred, pt too fatigued after recent PT session    Balance Overall balance assessment: Needs assistance Sitting-balance support: No upper extremity supported;Feet supported Sitting balance-[name] Scale: Good                                     ADL either performed or assessed with clinical judgement   ADL Overall ADL's : Needs assistance/impaired Eating/Feeding: Set up;Sitting   Grooming: Set up;Sitting   Upper Body Bathing: Sitting;Moderate assistance   Lower Body Bathing: Sitting/lateral leans;Maximal assistance   Upper Body Dressing : Sitting;Minimal assistance   Lower Body Dressing:  Sitting/lateral leans;Maximal assistance                       Vision Baseline Vision/History: No visual deficits Patient Visual Report: No change from baseline     Perception     Praxis      Cognition Arousal/Alertness: Awake/alert Behavior During Therapy: WFL for tasks assessed/performed Overall Cognitive Status: Within Functional Limits for tasks assessed                                          Exercises Other Exercises Other Exercises: Pt verbalized understanding of all education provided this date in AE/DME, energy conservation, work simplification, home/routines modifications, and falls prevention strategies. Handout provided from previous date.   Shoulder Instructions       General Comments      Pertinent Vitals/ Pain       Pain Assessment: No/denies pain  Home Living                                          Prior Functioning/Environment              Frequency  Min 1X/week        Progress Toward Goals  OT Goals(current goals can now be  found in the care plan section)  Progress towards OT goals: Progressing toward goals  Acute Rehab OT Goals Patient Stated Goal: to improve strength OT Goal Formulation: With patient Potential to Achieve Goals: Good  Plan Discharge plan remains appropriate;Frequency remains appropriate    Co-evaluation                 AM-PAC PT "6 Clicks" Daily Activity     Outcome Measure   Help from another person eating meals?: A Little Help from another person taking care of personal grooming?: A Little Help from another person toileting, which includes using toliet, bedpan, or urinal?: A Lot Help from another person bathing (including washing, rinsing, drying)?: A Lot Help from another person to put on and taking off regular upper body clothing?: A Little Help from another person to put on and taking off regular lower body clothing?: A Lot 6 Click Score: 15    End of  Session    OT Visit Diagnosis: Muscle weakness (generalized) (M62.81)   Activity Tolerance Patient tolerated treatment well   Patient Left in chair;with call bell/phone within reach;with chair alarm set   Nurse Communication          Time: 0388-8280 OT Time Calculation (min): 18 min  Charges: OT General Charges $OT Visit: 1 Visit OT Treatments $Self Care/Home Management : 8-22 mins  Jeni Salles, MPH, MS, OTR/L ascom [phone] 01/26/17, 12:10 PM

## 2017-01-26 NOTE — Clinical Social Work Note (Signed)
Patient has appealed her discharge. RN CM is handling the appeal with [employer]. Insurance has yet to authorize. And due to appeal, patient will more than likely will require another PT note. Shela Leff MSW,LCSW

## 2017-01-26 NOTE — Care Management (Addendum)
Cm discussed that her insurance company approved her to go to skilled nursing facility.  Patient stated "that lady from that place about my appeal told me not to go anywhere until Ihear from her."  Discussed if discharge is upheld, she can proceed to the skilled nursing facility  or home.  She states she wants to go to the skilled nursing facility.  have updated [facility]

## 2017-01-26 NOTE — Progress Notes (Signed)
Physical Therapy Treatment Patient Details Name: Loretta Wade MRN: [id number] DOB: 1950/08/02 Today's Date: 01/26/2017    History of Present Illness Pt is a 66 y.o. female who was admitted to the [facility] with diarrhea, abdominal pain, possible UTI, incontinence, and SOB.  Pt  has acute on chronic diastolic CHF, UTI, diarrhea, htn, chronic hypercarbic respiratory failure secondary to COPD/suspected obesity hypoventilation,  Pt. PMHx includes: COPD (on 2L home O2), CHF, chronic back pain, fibromyalgia, R TKA.    PT Comments    Pt demonstrating improved bed mobility today (SBA supine to/from sit and logrolling in bed with heavy use of bedrails).  Pt initially required mod assist for 3/4th stand from bed but then pt required min assist next 2 attempts at standing with use of bariatric RW.  Pt needing to have bowel movement when sitting on edge of bed (no bariatric BSC available) so pt assisted back into bed to use bed pan but then after toileting pt got back out of bed again to attempt transfer to chair.  Once standing, pt able to take a few steps bed to recliner (with bariatric RW) with CGA x2 for safety; limited distance ambulating d/t SOB and fatigue (pt's O2 sats 94% or greater on 3 L O2 via nasal cannula and HR 114-138 bpm during session).  Pt currently demonstrating generalized weakness, SOB with activity (requiring some pacing with activities), and deconditioning and would benefit from STR to increase strength and functional independence to return to prior level of function.  Will continue to focus on strengthening and progressing functional mobility during hospitalization.   Follow Up Recommendations  SNF     Equipment Recommendations  Rolling walker with 5" wheels;Wheelchair (measurements PT)(pt has manual w/c at home)    Recommendations for Other Services       Precautions / Restrictions Precautions Precautions: Fall Restrictions Weight Bearing Restrictions: No    Mobility   Bed Mobility Overal bed mobility: Needs Assistance Bed Mobility: Supine to Sit;Sit to Supine Rolling: Supervision   Supine to sit: Supervision;HOB elevated Sit to supine: Supervision;HOB elevated   General bed mobility comments: pt able to sit up on edge of bed with increased time and effort (pt brought legs off of bed, short rest break d/t SOB, and then used bedrail to sit on edge of bed ; x2 trials supine to sit); pt able to swing LE's and trunk back into bed quickly d/t needing to use bed pan for bowel movement; pt able to logroll to R x4 trials and to L x1 trial with use of bed rail to get on/off bed pan and also for hygiene and changing linen on bed (linen soiled d/t incontinence)  Transfers Overall transfer level: Needs assistance Equipment used: (Bariatric RW) Transfers: Sit to/from Stand           General transfer comment: 1st trial pt with 3/4th stand with mod assist of therapist; 2nd trial pt able to stand with min assist but limited standing time d/t SOB; 3rd trial pt stood with min assist; cueing for LE placement required  Ambulation/Gait Ambulation/Gait assistance: Min guard;+2 physical assistance(2nd assist CGA for safety) Ambulation Distance (Feet): 3 Feet(bed to recliner) Assistive device: (Bariatric RW)   Gait velocity: decreased   General Gait Details: decreased B step length/foot clearance; increased B lateral sway; limited distance d/t SOB   Stairs            Wheelchair Mobility    Modified Rankin (Stroke Patients Only)  Balance Overall balance assessment: Needs assistance Sitting-balance support: No upper extremity supported;Feet supported Sitting balance-[name] Scale: Good Sitting balance - Comments: sitting reaching within BOS   Standing balance support: Bilateral upper extremity supported(on bariatric RW) Standing balance-[name] Scale: Poor Standing balance comment: requires UE support for static standing balance                             Cognition Arousal/Alertness: Awake/alert Behavior During Therapy: WFL for tasks assessed/performed Overall Cognitive Status: Within Functional Limits for tasks assessed                                        Exercises     General Comments General comments (skin integrity, edema, etc.): Pt in bariatric rotating bed.  Nursing cleared pt for participation in physical therapy.  Pt agreeable to PT session.      Pertinent Vitals/Pain Pain Assessment: No/denies pain Pain Score: 6  Pain Location: chronic pain "all over" Pain Descriptors / Indicators: Sore;Tender;Constant Pain Intervention(s): Limited activity within patient's tolerance;Monitored during session;Repositioned;Patient requesting pain meds-RN notified;RN gave pain meds during session    Home Living                      Prior Function            PT Goals (current goals can now be found in the care plan section) Acute Rehab PT Goals Patient Stated Goal: to improve strength PT Goal Formulation: With patient Time For Goal Achievement: 02/02/17 Potential to Achieve Goals: Good Progress towards PT goals: Progressing toward goals    Frequency    Min 2X/week      PT Plan Current plan remains appropriate    Co-evaluation              AM-PAC PT "6 Clicks" Daily Activity  Outcome Measure  Difficulty turning over in bed (including adjusting bedclothes, sheets and blankets)?: A Lot Difficulty moving from lying on back to sitting on the side of the bed? : A Lot Difficulty sitting down on and standing up from a chair with arms (e.g., wheelchair, bedside commode, etc,.)?: A Little Help needed moving to and from a bed to chair (including a wheelchair)?: A Little Help needed walking in hospital room?: Total Help needed climbing 3-5 steps with a railing? : Total 6 Click Score: 12    End of Session Equipment Utilized During Treatment: Oxygen;Gait belt(3 L O2 via nasal  cannula) Activity Tolerance: Patient tolerated treatment well Patient left: in chair;with call bell/phone within reach;with chair alarm set;with nursing/sitter in room Nurse Communication: Mobility status;Precautions PT Visit Diagnosis: Other abnormalities of gait and mobility (R26.89);Muscle weakness (generalized) (M62.81)     Time: 0814-4818 PT Time Calculation (min) (ACUTE ONLY): 70 min  Charges:  $Therapeutic Exercise: 8-22 mins $Therapeutic Activity: 53-67 mins                    G CodesLeitha Bleak, PT 01/26/17, 1:14 PM [phone]

## 2017-01-26 NOTE — Consult Note (Signed)
   [facility] CM Inpatient Consult   01/26/2017  [place] Nov 18, 1950 [id number]   Referral received and spoke with inpatient RNCM, Serafina Royals source].  Chart review reveals the patient's had previously been outreached by [facility] Nurse who states patient's primary care provider is Not a [facility] Provider, Clovia Cuff, [facility] [employer].  Notified Nann that patient has a non-[facility] provider. Nann states patient is now attempting a skilled facility for rehab.  For questions, please contact:  Natividad Brood, RN BSN [facility] Liaison  [phone] business mobile phone Toll free office [phone]

## 2017-01-26 NOTE — Progress Notes (Signed)
[place] at [place] NAME: Loretta Wade    MR#:  [id number]  DATE OF BIRTH:  1950/07/11  SUBJECTIVE:  CHIEF COMPLAINT:   Chief Complaint  Patient presents with  . Failure To Thrive   SOB improved. Some wheezing Feels weak. Worked with PT and sitting in a chair  REVIEW OF SYSTEMS:   Constitutional: Negative for chills, fever and weight loss.  HENT: Negative for ear discharge, ear pain and nosebleeds.   Eyes: Negative for blurred vision, pain and discharge.  Respiratory: Positive for shortness of breath. Negative for sputum production, wheezing and stridor.   Cardiovascular: Negative for chest pain, palpitations, orthopnea and PND.  Gastrointestinal: Positive for diarrhea. Negative for abdominal pain, nausea and vomiting.  Genitourinary: Positive for dysuria. Negative for frequency and urgency.  Musculoskeletal: Positive for back pain. Negative for joint pain.  Neurological: Positive for weakness. Negative for sensory change, speech change and focal weakness.  Psychiatric/Behavioral: Negative for depression and hallucinations. The patient is not nervous/anxious.    ROS  DRUG ALLERGIES:   Allergies  Allergen Reactions  . Gabapentin     Pt reports dizziness and abnormal urine labs.  . Tramadol Nausea And Vomiting    VITALS:  Blood pressure (!) 138/54, pulse (!) 102, temperature 98.4 F (36.9 C), temperature source Oral, resp. rate 17, height 5\' 4"  (1.626 m), weight (!) 158.8 kg (350 lb), SpO2 97 %.  PHYSICAL EXAMINATION:   GENERAL:  66 y.o.-year-old patient lying in the bed with no acute distress. Morbidly obese. EYES: Pupils equal, round, reactive to light and accommodation. No scleral icterus. Extraocular muscles intact.  HEENT: Head atraumatic, normocephalic. Oropharynx and nasopharynx clear.  NECK:  Supple, no jugular venous distention. No thyroid enlargement, no tenderness.  LUNGS: Normal breath sounds bilaterally, no  wheezing, rales,rhonchi or crepitation. No use of accessory muscles of respiration. Decreased at bases CARDIOVASCULAR: S1, S2 normal. No murmurs, rubs, or gallops.  ABDOMEN: Soft, nontender, nondistended. Bowel sounds present. No organomegaly or mass. obese EXTREMITIES:pedal edema, no cyanosis, or clubbing.  NEUROLOGIC: Cranial nerves II through XII are intact. Muscle strength 5/5 in all extremities. Sensation intact. Gait not checked.  PSYCHIATRIC: The patient is alert and oriented x 3.  SKIN: No obvious rash, lesion, or ulcer.   Physical Exam LABORATORY PANEL:   CBC No results for input(s): WBC, HGB, HCT, PLT in the last 168 hours. ------------------------------------------------------------------------------------------------------------------  Chemistries  Recent Labs  Lab 01/22/17 1106 01/23/17 0249 01/25/17 0424  NA 138 139  --   K 4.2 3.7  --   CL 93* 95*  --   CO2 34* 33*  --   GLUCOSE 184* 137*  --   BUN 12 13  --   CREATININE 0.87 0.79 0.77  CALCIUM 9.3 9.5  --   MG 2.3  --   --    ------------------------------------------------------------------------------------------------------------------  Cardiac Enzymes No results for input(s): TROPONINI in the last 168 hours. ------------------------------------------------------------------------------------------------------------------  RADIOLOGY:  No results found.  ASSESSMENT AND PLAN:   Active Problems:   CHF (congestive heart failure) (HCC)  Loretta Wade  is a 66 y.o. female with a known history of morbid obesity, chronic respiratory failure secondary to CHF and COPD on 2 L nasal cannula oxygen, chronic hypercarbia, sleep apnea not on CPAP comes to the emergency room with increasing weakness, shortness of breath  *Acute on chronic diastolic congestive heart failure - IV Lasix. Changed to PO today. Input and Output - Counseled to limit fluids  and Salt - Echo-reviewed -Cardiology follow up after  discharge Scheduled nebs  * Acute bronchitis On prednisone and nebs  * UTI -Finished Abx  *Constipation.  Added scheduled MiraLAX Give a dose of lactulose today  *  Hypertension continue home meds  *  Chronic hypercarbic respiratory failure secondary to COPD/suspected obesity hypoventilation syndrome/history of sleep apnea not on CPAP -Patient has chronic hypercarbia continue to monitor.  Her mental status is stable.  Continue oxygenation -Nebs as needed  *Anemia of chronic disease is stable  Remove purewick and encouraged her to get out of bed   All the records are reviewed and case discussed with Care Management/Social Worker Management plans discussed with the patient, family and they are in agreement.  CODE STATUS: Full.  TOTAL TIME TAKING CARE OF THIS PATIENT: 35 minutes.   Neita Carp M.D on 01/26/2017   Between 7am to 6pm - Pager - [phone]  After 6pm go to [email or web address] - password [email or web address] [facility]  Office  [phone]  CC: Primary care physician; Clovia Cuff, MD  Note: This dictation was prepared with Dragon dictation along with smaller phrase technology. Any transcriptional errors that result from this process are unintentional.

## 2017-01-26 NOTE — Care Management (Signed)
Discharge appeal is in clinical reveiw

## 2017-01-26 NOTE — Clinical Social Work Note (Signed)
CSW has spoken to Jeanerette at [facility] and they have given prior auth for patient to go to [facility]. The Josem Kaufmann is only good until Sunday 11/25. After this, the auth expires and they will require a new PT assessment to be submitted to them for review. CSW has explained all of this to patient and she has stated that she does not wish to discharge from the hospital and wishes to continue with the medicare appeals process even though she realizes that she may lose authorization. RN CM has been notified. Shela Leff MSW,LCSW [phone]

## 2017-01-26 NOTE — Care Management (Signed)
CM faxed all information requested for appeal to [facility] and received confirmation that fax was received 9:47 morning. Explained the review process with patient and if discharge is upheld, she will have until the following 12 noon before incurring out of pocket expense for her continued stay should she not discharge

## 2017-01-27 MED ORDER — CALCIUM CARBONATE ANTACID 500 MG PO CHEW
1.0000 | CHEWABLE_TABLET | Freq: Three times a day (TID) | ORAL | Status: DC | PRN
  Administered 2017-01-28: 200 mg via ORAL
  Filled 2017-01-27: qty 1

## 2017-01-27 MED ORDER — LORATADINE 10 MG PO TABS
10.0000 mg | ORAL_TABLET | Freq: Every day | ORAL | Status: DC
  Administered 2017-01-27 – 2017-01-28 (×2): 10 mg via ORAL
  Filled 2017-01-27 (×2): qty 1

## 2017-01-27 NOTE — Clinical Social Work Note (Addendum)
CSW is continuing to follow pending update on appeal. According to [facility] note from 01/26/17, the appeal is in the clinical process. CSW will facilitate discharge once appeal process is complete. The patient has a current authorization that will expire at 11:59 PM on 01/28/17. Should she not discharge by that time, a new authorization would need to be initiated.  2:56 PM CSW was informed by the attending MD that the patient's appeal has been denied. CSW will discuss imminent discharge with the patient.  Santiago Bumpers, MSW, Latanya Presser [phone]

## 2017-01-27 NOTE — Clinical Social Work Note (Signed)
CSW spoke with the patient about her imminent discharge. The patient reported that Rose at [facility] "told me I had the right to stay here until 12 noon tomorrow. And I am staying here until then because it is my right." The CSW inquired as to her reasoning for continued admission, and the patient stated "Well, for one, I am in pain." The CSW inquired as to what her decision will be at noon tomorrow if she continues to feel her current pain level. The patient stated "[facility] told me that I have up to 14 days to appeal. And I am not liable for paying until tomorrow at 12 noon." The CSW has advised the [facility] and the attending MD that the patient is declining discharge at this time. CSW will continue to follow.  Santiago Bumpers, MSW, Latanya Presser [phone]

## 2017-01-27 NOTE — Progress Notes (Signed)
Loretta Wade called back from [facility]. Ms Ow has lost her appeal of DC. Her last covered day is today. Her liability will begin at noon tomorrow, 11/26. She has been advised of this by Loretta Wade on the phone number that was given to her by Ms Nevels. Loretta Wade did note that she had to leave a voicemail.

## 2017-01-27 NOTE — Progress Notes (Signed)
Received a call from Kelliher at [facility] [phone] requesting confirmation that Ms Arnall does have a SNF bed available and that that is the planned disposition. I spoke with Greig Right, the social worker following this morning, who stated that there is a bed and a [facility] that expires tomorrow, 11/25.  Cecelia Byars took this information and indicated that she will process the appeal today and be back in touch with the patient and me this afternoon.

## 2017-01-28 NOTE — Progress Notes (Signed)
Pt to be discharged to [facility] care [facility] today. Iv and tele removed. Report called to briana at the facility. Transport by e.m.s.

## 2017-01-28 NOTE — Clinical Social Work Note (Signed)
Patient is set to discharge to [facility] today no later than 12 pm. CSW has updated the facility that she is cleared for discharge, and all paperwork has been received. CSW will deliver packet to the chart as able. CSW will continue to follow pending additional discharge needs.  Santiago Bumpers, MSW, Latanya Presser [phone]

## 2017-01-28 NOTE — Care Management Note (Signed)
Case Management Note  Patient Details  Name: Loretta Wade MRN: [id number] Date of Birth: 11-02-1950  Subjective/Objective:     Discharge events thus far:  01/27/17= Medicare Appeal was denied. 01/28/17=A bed at [facility] was authorized until 01/28/17.  01/28/17= [name] is refusing to leave until noon today. Also saying that she may appeal again.                Action/Plan:   Expected Discharge Date:  01/28/17               Expected Discharge Plan:     In-House Referral:     Discharge planning Services     Post Acute Care Choice:    Choice offered to:     DME Arranged:    DME Agency:     HH Arranged:    HH Agency:     Status of Service:     If discussed at [facility] [employer], dates discussed:    Additional Comments:  Mayla Biddy A, RN 01/28/2017, 9:54 AM

## 2017-02-06 ENCOUNTER — Ambulatory Visit: Payer: Medicare PPO | Admitting: Family

## 2017-03-07 ENCOUNTER — Encounter: Payer: Self-pay | Admitting: Emergency Medicine

## 2017-03-07 ENCOUNTER — Emergency Department
Admission: EM | Admit: 2017-03-07 | Discharge: 2017-03-07 | Disposition: A | Discharge status: Home / Self Care | Payer: Medicare PPO | Attending: Emergency Medicine | Admitting: Emergency Medicine

## 2017-03-07 ENCOUNTER — Other Ambulatory Visit: Payer: Self-pay

## 2017-03-07 ENCOUNTER — Emergency Department: Payer: Medicare PPO

## 2017-03-07 DIAGNOSIS — Z87891 Personal history of nicotine dependence: Secondary | ICD-10-CM | POA: Diagnosis not present

## 2017-03-07 DIAGNOSIS — I110 Hypertensive heart disease with heart failure: Secondary | ICD-10-CM | POA: Insufficient documentation

## 2017-03-07 DIAGNOSIS — I5032 Chronic diastolic (congestive) heart failure: Secondary | ICD-10-CM | POA: Diagnosis not present

## 2017-03-07 DIAGNOSIS — Z96651 Presence of right artificial knee joint: Secondary | ICD-10-CM | POA: Diagnosis not present

## 2017-03-07 DIAGNOSIS — Z79899 Other long term (current) drug therapy: Secondary | ICD-10-CM | POA: Diagnosis not present

## 2017-03-07 DIAGNOSIS — R0602 Shortness of breath: Secondary | ICD-10-CM

## 2017-03-07 DIAGNOSIS — J441 Chronic obstructive pulmonary disease with (acute) exacerbation: Secondary | ICD-10-CM | POA: Diagnosis not present

## 2017-03-07 LAB — BASIC METABOLIC PANEL
Anion gap: 8 (ref 5–15)
BUN: 11 mg/dL (ref 6–20)
CO2: 36 mmol/L — ABNORMAL HIGH (ref 22–32)
Calcium: 9.6 mg/dL (ref 8.9–10.3)
Chloride: 95 mmol/L — ABNORMAL LOW (ref 101–111)
Creatinine, Ser: 0.67 mg/dL (ref 0.44–1.00)
GFR calc Af Amer: >60 mL/min (ref >60)
Glucose, Bld: 148 mg/dL — ABNORMAL HIGH (ref 65–99)
POTASSIUM: 3.8 mmol/L (ref 3.5–5.1)
SODIUM: 139 mmol/L (ref 135–145)

## 2017-03-07 LAB — BLOOD GAS, VENOUS
Acid-Base Excess: 13 mmol/L — ABNORMAL HIGH (ref 0.0–2.0)
Bicarbonate: 42.1 mmol/L — ABNORMAL HIGH (ref 20.0–28.0)
O2 Saturation: 78.8 %
PATIENT TEMPERATURE: 37
PCO2 VEN: 78 mmHg — AB (ref 44.0–60.0)
pH, Ven: 7.34 (ref 7.250–7.430)

## 2017-03-07 LAB — CBC
HEMATOCRIT: 37.5 % (ref 35.0–47.0)
Hemoglobin: 12.1 g/dL (ref 12.0–16.0)
MCH: 29.9 pg (ref 26.0–34.0)
MCHC: 32.4 g/dL (ref 32.0–36.0)
MCV: 92.3 fL (ref 80.0–100.0)
PLATELETS: 205 10*3/uL (ref 150–440)
RBC: 4.06 MIL/uL (ref 3.80–5.20)
RDW: 17.6 % — AB (ref 11.5–14.5)
WBC: 6 10*3/uL (ref 3.6–11.0)

## 2017-03-07 LAB — BRAIN NATRIURETIC PEPTIDE: B NATRIURETIC PEPTIDE 5: 15 pg/mL (ref 0.0–100.0)

## 2017-03-07 LAB — LACTIC ACID, PLASMA
Lactic Acid, Venous: 2 mmol/L (ref 0.5–1.9)
Lactic Acid, Venous: 1.7 mmol/L (ref 0.5–1.9)

## 2017-03-07 LAB — TROPONIN I: Troponin I: <0.03 ng/mL (ref <0.03)

## 2017-03-07 MED ORDER — DOXYCYCLINE HYCLATE 100 MG PO CAPS: 100.0000 mg | ORAL_CAPSULE | Freq: Two times a day (BID) | ORAL | 0 refills | Status: DC

## 2017-03-07 MED ORDER — MAGNESIUM SULFATE 2 GM/50ML IV SOLN
2.0000 g | Freq: Once | INTRAVENOUS | Status: AC
  Administered 2017-03-07: 2 g via INTRAVENOUS
  Filled 2017-03-07: qty 50

## 2017-03-07 MED ORDER — ALBUTEROL SULFATE (2.5 MG/3ML) 0.083% IN NEBU
2.5000 mg | INHALATION_SOLUTION | Freq: Once | RESPIRATORY_TRACT | Status: AC
  Administered 2017-03-07: 2.5 mg via RESPIRATORY_TRACT
  Filled 2017-03-07: qty 3

## 2017-03-07 MED ORDER — PREDNISONE 20 MG PO TABS
40.0000 mg | ORAL_TABLET | ORAL | Status: AC
  Administered 2017-03-07: 40 mg via ORAL
  Filled 2017-03-07: qty 2

## 2017-03-07 MED ORDER — METHYLPREDNISOLONE SODIUM SUCC 125 MG IJ SOLR
125.0000 mg | Freq: Once | INTRAMUSCULAR | Status: AC
  Administered 2017-03-07: 125 mg via INTRAVENOUS
  Filled 2017-03-07: qty 2

## 2017-03-07 MED ORDER — DOXYCYCLINE HYCLATE 100 MG PO TABS
100.0000 mg | ORAL_TABLET | Freq: Once | ORAL | Status: AC
  Administered 2017-03-07: 100 mg via ORAL
  Filled 2017-03-07: qty 1

## 2017-03-07 MED ORDER — PREDNISONE 20 MG PO TABS: 40.0000 mg | ORAL_TABLET | Freq: Every day | ORAL | 0 refills | Status: DC

## 2017-03-07 NOTE — ED Notes (Signed)
Report to Tiffany at [facility].

## 2017-03-07 NOTE — ED Notes (Signed)
Pt given sandwich tray and coffee.  

## 2017-03-07 NOTE — ED Provider Notes (Signed)
 -----------------------------------------   11:38 AM on 03/07/2017 -----------------------------------------  Patient feels better. Oxygenation improved, high 90s on her chronic nasal cannula oxygen. Repeat auscultation shows symmetric air entry in all lung fields, normal work of breathing. Slight end expiratory wheezing. Chest x-ray not revealing of any severe findings. At this point it appears to be a COPD exacerbation, relatively uncomplicated. Discussed discharge home with prednisone and doxycycline, patient is agreeable to this plan. Return precautions. At this point not requiring hospitalization, unlikely bacterial infection, low suspicion of ACS PE dissection. No evidence of sepsis.   Carrie Mew, MD 03/07/17 1139

## 2017-03-07 NOTE — ED Notes (Signed)
Date and time results received: 03/07/17 7:20 AM   Test & Critical Value: pCO2 78  Name of Provider Notified: Dr. Charlesetta Ivory, Dr. Carrie Mew

## 2017-03-07 NOTE — ED Notes (Signed)
Pt left with [facility]. Brief changed prior to discharge. Pt alert and oriented X4, active, cooperative, pt in NAD. RR even and unlabored, color WNL.

## 2017-03-07 NOTE — ED Notes (Signed)
Pt brief changed and linens changed. Offered snack but denies.

## 2017-03-07 NOTE — ED Triage Notes (Signed)
Pt arrived via EMS from [place] where facility reported pt having respiratory distress. EMS reports she was 95% on her continuous 2L O2. Pt has bilateral wheezing. Pt received 2 nebulizer treatments before arrival. Pt reports she has been in bed for the past few days due to "not feeling good." Pt is 92% on her 2L on arrival to ED.

## 2017-03-07 NOTE — ED Notes (Signed)
[facility] called for transport back to [facility]

## 2017-03-07 NOTE — ED Notes (Signed)
Pt placed on 4L  ?

## 2017-03-07 NOTE — ED Provider Notes (Signed)
[facility] Emergency Department Provider Note   ____________________________________________   First MD Initiated Contact with Patient 03/07/17 0515     (approximate)  I have reviewed the triage vital signs and the nursing notes.   HISTORY  Chief Complaint Respiratory Distress    HPI Loretta Wade is a 67 y.o. female who comes into the hospital today stating that she could not catch her breath.  She reports that it started earlier this afternoon around 6.  She reports that she did some breathing treatments but it did not help.  The patient has had a nonproductive cough.  She has some mild chest tightness but denies nausea or vomiting.  She has had some dizziness but no swelling in her extremities.  The patient typically is on 2 L of O2.  According to EMS the patient had some hypoxia so they bumped her up to 3.  The patient has been admitted multiple times for COPD in the past 6 months.  The patient was sent by her nursing home for further evaluation of her shortness of breath.   Past Medical History:  Diagnosis Date  . Anemia   . CHF (congestive heart failure) ([place])   . Chronic back pain   . COPD (chronic obstructive pulmonary disease) (HCC)    on 2L home o2  . Edema   . Esophageal reflux   . Fatigue   . Fibromyalgia    Following with pain management  . Hypertension   . Nocturia   . OA (osteoarthritis)   . Sleep apnea    not on CPAP    Patient Active Problem List   Diagnosis Date Noted  . CHF (congestive heart failure) ([place]) 01/18/2017  . [place] intertrigo 02/08/2016  . Dysuria 02/08/2016  . Requires assistance with activities of daily living (ADL) 02/08/2016  . Oral thrush 11/10/2015  . Tachypnea 11/01/2015  . Acute non-recurrent maxillary sinusitis 10/29/2015  . Fall 07/11/2015  . Dyslipidemia 06/17/2015  . Overflow stress incontinence of urine in female 06/17/2015  . Chronic pain 06/10/2015  . Chronic low back pain (Location  of Tertiary source of pain) (Bilateral) (R>L) 06/10/2015  . Chronic shoulder pain (Location of Secondary source of pain) (Left) 06/10/2015  . Costochondritis (Location of Primary Source of Pain) 06/10/2015  . Long term current use of opiate analgesic 06/10/2015  . Long term prescription opiate use 06/10/2015  . Opiate use 06/10/2015  . Encounter for therapeutic drug level monitoring 06/10/2015  . Encounter for pain management planning 06/10/2015  . Chronic feet pain (Bilateral) (R>L) 06/10/2015  . Osteoarthritis 06/10/2015  . Osteoarthritis of shoulder (Left) 06/10/2015  . COPD exacerbation ([place]) 04/12/2015  . Poor mobility 04/12/2015  . Fibromyalgia 03/23/2015  . GERD (gastroesophageal reflux disease) 03/23/2015  . MRSA infection 11/17/2014  . Chronic diastolic CHF (congestive heart failure) ([place]) 11/12/2014  . Morbid obesity with BMI of 50.0-59.9, adult ([place]) 11/12/2014  . Neck pain, chronic 11/12/2014  . COPD (chronic obstructive pulmonary disease) ([place]) 11/04/2014  . Urinary tract infection 10/02/2014    Past Surgical History:  Procedure Laterality Date  . ABDOMINAL HYSTERECTOMY    . CHOLECYSTECTOMY    . TOTAL KNEE ARTHROPLASTY     right  . VESICOVAGINAL FISTULA CLOSURE W/ TAH      Prior to Admission medications   Medication Sig Start Date End Date Taking? Authorizing Provider  acetaminophen (TYLENOL) 325 MG tablet Take 2 tablets (650 mg total) by mouth every 6 (six) hours as needed for  mild pain (or Fever >/= 101). 11/17/14  Yes Gladstone Lighter, MD  ANORO ELLIPTA 62.5-25 MCG/INH AEPB Inhale 1 puff into the lungs daily. 10/06/15  Yes [provider]  cholecalciferol (VITAMIN D) 1000 UNITS tablet Take 1,000 Units by mouth daily.    Yes [provider]  citalopram (CELEXA) 20 MG tablet TAKE ONE (1) TABLET BY MOUTH EVERY DAY 05/16/16  Yes Roselee Nova, MD  colesevelam [facility]) 625 MG tablet TAKE ONE TABLET TWICE A DAY WITH FOOD 05/16/16  Yes Roselee Nova, MD  cyanocobalamin 500 MCG tablet Take 500 mcg by mouth daily.   Yes [provider]  diclofenac sodium (VOLTAREN) 1 % GEL Apply 4 g topically 4 (four) times daily.   Yes [provider]  fluticasone (FLONASE) 50 MCG/ACT nasal spray USE TWO SPRAYS IN EACH NOSTRIL EVERY DAY 07/06/16  Yes Sowles, Drue Stager, MD  furosemide (LASIX) 40 MG tablet Take 1 tablet (40 mg total) by mouth 2 (two) times daily. 02/08/15  Yes Dustin Flock, MD  guaiFENesin (MUCINEX) 600 MG 12 hr tablet Take 1 tablet (600 mg total) by mouth 2 (two) times daily. 10/11/16  Yes Theodoro Grist, MD  HYDROcodone-acetaminophen (NORCO/VICODIN) 5-325 MG tablet Take 1 tablet by mouth every 6 (six) hours as needed for moderate pain.   Yes [provider]  hydrocortisone cream 1 % Apply 1 application topically 2 (two) times daily. Apply to rash on upper back.   Yes [provider]  ipratropium-albuterol (DUONEB) 0.5-2.5 (3) MG/3ML SOLN Take 3 mLs by nebulization every 6 (six) hours as needed. 11/28/16  Yes Vaughan Basta, MD  loperamide (IMODIUM) 2 MG capsule Take 4 mg by mouth daily as needed for diarrhea or loose stools.    Yes [provider]  Magnesium 250 MG TABS Take 250 mg by mouth daily.   Yes [provider]  nystatin (MYCOSTATIN) 100000 UNIT/ML suspension Take 5 mLs (500,000 Units total) by mouth 4 (four) times daily. 11/10/15  Yes Roselee Nova, MD  omeprazole (PRILOSEC) 20 MG capsule Take 20 mg by mouth daily.   Yes [provider]  oxybutynin (DITROPAN-XL) 10 MG 24 hr tablet Take 10 mg by mouth daily.   Yes [provider]  polyethylene glycol (MIRALAX / GLYCOLAX) packet Take 17 g daily by mouth. 01/23/17  Yes Sudini, Alveta Heimlich, MD  pregabalin (LYRICA) 150 MG capsule Take 1 capsule (150 mg total) by mouth 2 (two) times daily. 05/16/16  Yes Roselee Nova, MD  Probiotic Product (ALIGN) 4 MG CAPS Take 4 mg by mouth daily.   Yes [provider]   rOPINIRole (REQUIP) 0.25 MG tablet Take 3 tablets (0.75 mg total) by mouth at bedtime. 05/16/16  Yes Keith Rake Asad A, MD  albuterol (PROVENTIL) (2.5 MG/3ML) 0.083% nebulizer solution USE ONE TREATMENT EVERY 6 HOURS AS NEEDED FOR WHEEZING OR SHORTNESS OF BREATH Patient not taking: Reported on 03/07/2017 05/16/16   Roselee Nova, MD  chlorpheniramine-HYDROcodone (TUSSIONEX) 10-8 MG/5ML SUER Take 5 mLs by mouth every 12 (twelve) hours as needed for cough. Patient not taking: Reported on 03/07/2017 10/11/16   Theodoro Grist, MD  dexlansoprazole (DEXILANT) 60 MG capsule TAKE ONE (1) CAPSULE EACH DAY 05/16/16   Roselee Nova, MD  oxybutynin (DITROPAN) 5 MG tablet TAKE ONE (1) TABLET BY MOUTH EVERY DAY Patient not taking: Reported on 03/07/2017 05/16/16   Roselee Nova, MD    Allergies Gabapentin; Tramadol; and Vioxx [rofecoxib]  Family History  Problem Relation Age of Onset  . Hypertension Mother   . Diabetes Unknown   . Breast cancer Daughter     Social History Social History   Tobacco Use  . Smoking status: Former Smoker    Last attempt to quit: 11/04/1983    Years since quitting: 33.3  . Smokeless tobacco: Never Used  . Tobacco comment: quit several years ago - almost 30 years ago  Substance Use Topics  . Alcohol use: No    Alcohol/week: 0.0 oz  . Drug use: No    Review of Systems  Constitutional: No fever/chills Eyes: No visual changes. ENT: No sore throat. Cardiovascular: chest tightness Respiratory:  Cough and shortness of breath. Gastrointestinal: No abdominal pain.  No nausea, no vomiting.   Genitourinary: Negative for dysuria. Musculoskeletal: Negative for back pain. Skin: Negative for rash. Neurological: Negative for headaches, focal weakness or numbness.   ____________________________________________   PHYSICAL EXAM:  VITAL SIGNS: ED Triage Vitals  Enc Vitals Group     BP 03/07/17 0515 (!) 149/69     Pulse Rate 03/07/17 0515 96     Resp 03/07/17 0515  (!) 24     Temp 03/07/17 0515 98.2 F (36.8 C)     Temp Source 03/07/17 0515 Oral     SpO2 03/07/17 0515 92 %     Weight 03/07/17 0516 (!) 335 lb (152 kg)     Height --      Head Circumference --      Peak Flow --      Pain Score --      Pain Loc --      Pain Edu? --      Excl. in [place]? --     Constitutional: Alert and oriented. Well appearing and in mild distress. Eyes: Conjunctivae are normal. PERRL. EOMI. Head: Atraumatic. Nose: No congestion/rhinnorhea. Mouth/Throat: Mucous membranes are moist.  Oropharynx non-erythematous. Cardiovascular: Normal rate, regular rhythm. Grossly normal heart sounds.  Good peripheral circulation. Respiratory: Normal respiratory effort.  Prolonged expiratory phase no retractions.  Diminished expiratory breath sounds. Gastrointestinal: Soft and nontender. No distention. Positive bowel sounds Musculoskeletal: No lower extremity tenderness nor edema.  Neurologic:  Normal speech and language.  Skin:  Skin is warm, dry and intact. Psychiatric: Mood and affect are normal.   ____________________________________________   LABS (all labs ordered are listed, but only abnormal results are displayed)  Labs Reviewed  BASIC METABOLIC PANEL - Abnormal; Notable for the following components:      Result Value   Chloride 95 (*)    CO2 36 (*)    Glucose, Bld 148 (*)    All other components within normal limits  CBC - Abnormal; Notable for the following components:   RDW 17.6 (*)    All other components within normal limits  LACTIC ACID, PLASMA - Abnormal; Notable for the following components:   Lactic Acid, Venous 2.0 (*)    All other components within normal limits  BLOOD GAS, VENOUS - Abnormal; Notable for the following components:   pCO2, Ven 78 (*)    Bicarbonate 42.1 (*)    Acid-Base Excess 13.0 (*)    All other components within normal limits  TROPONIN I  BRAIN NATRIURETIC PEPTIDE  LACTIC ACID, PLASMA    ____________________________________________  EKG  ED ECG REPORT I, Loney Hering, the attending physician, personally viewed and interpreted this ECG.   Date: 03/07/2017  EKG Time: 512  Rate: 100  Rhythm: sinus tachycardia  Axis: normal  Intervals:none  ST&T Change: none  ____________________________________________  RADIOLOGY  Dg Chest Portable 1 View  Result Date: 03/07/2017 CLINICAL DATA:  Acute onset of respiratory distress. Bilateral wheezing. EXAM: PORTABLE CHEST 1 VIEW COMPARISON:  Chest radiograph performed 01/18/2017 FINDINGS: The lungs are well-aerated. Mild vascular congestion is noted. There is no evidence of focal opacification, pleural effusion or pneumothorax. The cardiomediastinal silhouette is borderline enlarged. No acute osseous abnormalities are seen. IMPRESSION: Mild vascular congestion and borderline cardiomegaly. Lungs remain grossly clear. Electronically Signed   By: Garald Balding M.D.   On: 03/07/2017 05:28    ____________________________________________   PROCEDURES  Procedure(s) performed: None  Procedures  Critical Care performed: No  ____________________________________________   INITIAL IMPRESSION / ASSESSMENT AND PLAN / ED COURSE  As part of my medical decision making, I reviewed the following data within the electronic MEDICAL RECORD NUMBER Notes from prior ED visits and [place] Controlled Substance Database   This is a 67 year old female who comes into the hospital today with some shortness of breath.  My differential diagnosis includes COPD, CHF, pneumonia  The patient states that she does not use a CPAP machine at home when she is asleep.  We did check some blood work and the patient did receive some DuoNeb treatments prior to her coming into the hospital.  While the patient has some prolonged expiratory phase she also has some diminished breath sounds.  I will give the patient a dose of Solu-Medrol and magnesium.  I will also give  her some more albuterol.  The patient had some blood work and her CBC and BMP were unremarkable.  I also did a BNP which was negative.  The patient's blood gas shows a PCO2 of 78 but that is typical for the patient.  The patient's care was signed out to Dr. Joni Fears who will reassess the patient after her medication and determine her disposition.      ____________________________________________   FINAL CLINICAL IMPRESSION(S) / ED DIAGNOSES  Final diagnoses:  Shortness of breath     ED Discharge Orders    None       Note:  This document was prepared using Dragon voice recognition software and may include unintentional dictation errors.    Loney Hering, MD 03/07/17 [phone]

## 2017-03-07 NOTE — ED Notes (Signed)
ED Provider at bedside. 

## 2017-03-20 IMAGING — MR MR LUMBAR SPINE W/O CM
5 series · 38 of 48 positions shown · non-contrast
Comparison: CT abdomen and pelvis 11/13/2014.

CLINICAL DATA: Low back pain and bilateral leg pain for 6 months.
Bilateral leg weakness, worse on the left.

EXAM:
MRI LUMBAR SPINE WITHOUT CONTRAST
TECHNIQUE: Multiplanar, multisequence MR imaging of the lumbar spine was
performed. No intravenous contrast was administered.

[Series 2: T2 · sagittal · 4.0mm · 0.81mm/px · 6 of 17 slices shown (1 of 2)]
[im 1/17]
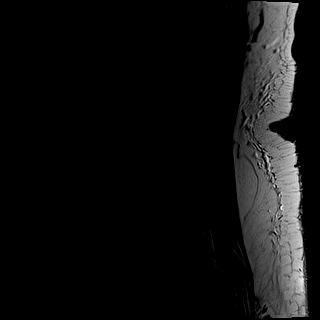
[im 4/17]
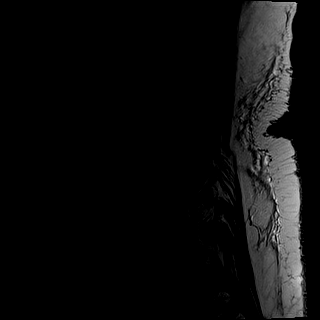
[im 7/17]
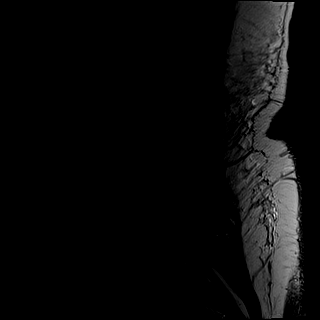
[im 10/17]
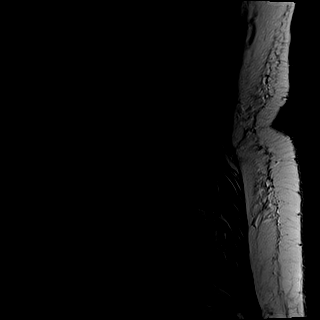
[im 13/17]
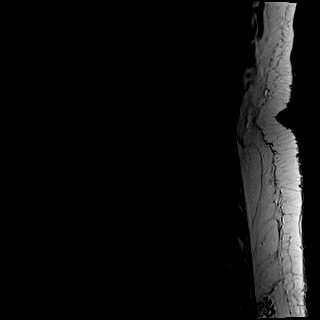
[im 17/17]
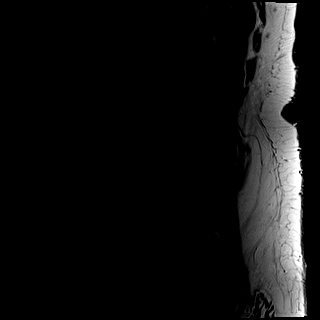

[Series 3: T1 · sagittal · 4.0mm · 0.81mm/px · 7 of 17 slices shown (1 of 2)]
[im 1/17]
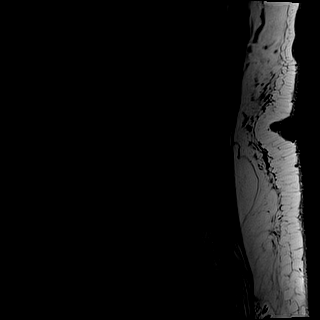
[im 3/17]
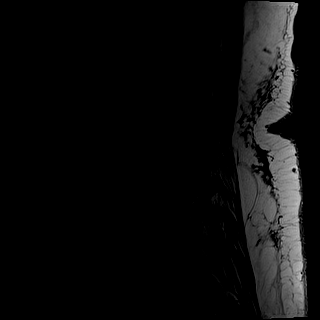
[im 6/17]
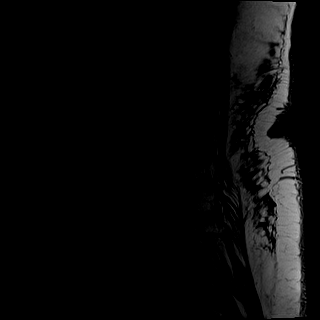
[im 9/17]
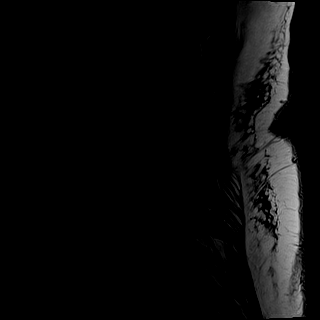
[im 11/17]
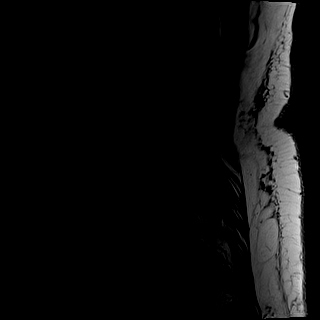
[im 14/17]
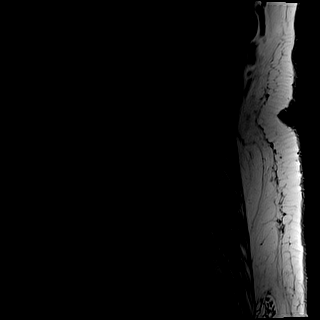
[im 17/17]
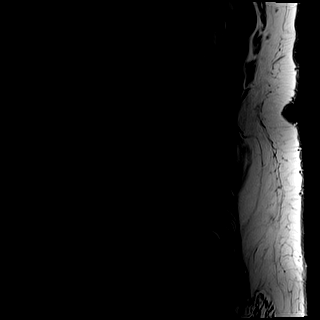

[Series 5: T2 · axial · 4.0mm · 0.78mm/px · z∈[-29,+155]mm · 10 of 34 slices shown (2 of 2)]
[im 1/34]
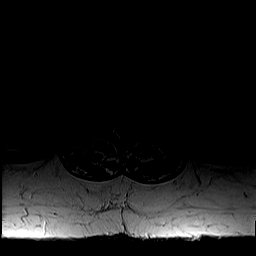
[im 3/34]
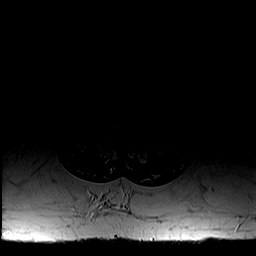
[im 6/34]
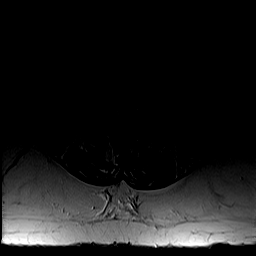
[im 8/34]
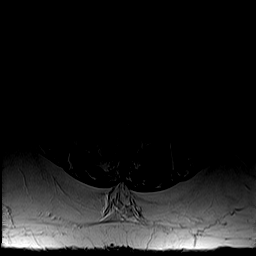
[im 11/34]
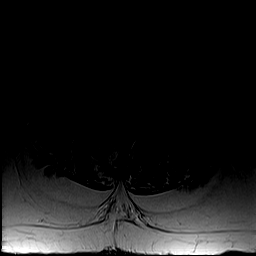
[im 16/34]
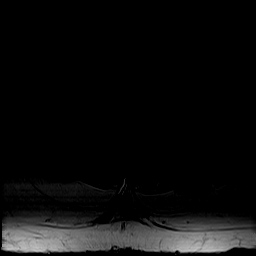
[im 18/34]
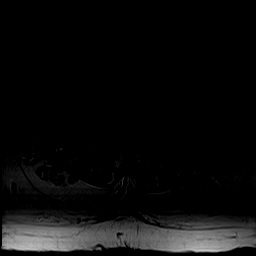
[im 23/34]
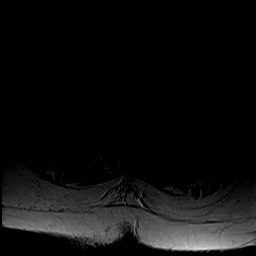
[im 28/34]
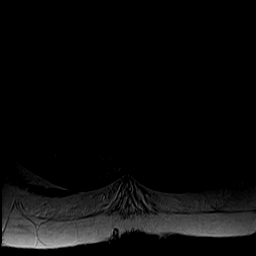
[im 34/34]
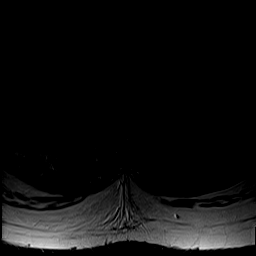

[Series 6: T1 · axial · 4.0mm · 0.39mm/px · z∈[-29,+155]mm · 8 of 34 slices shown (2 of 2)]
[im 1/34]
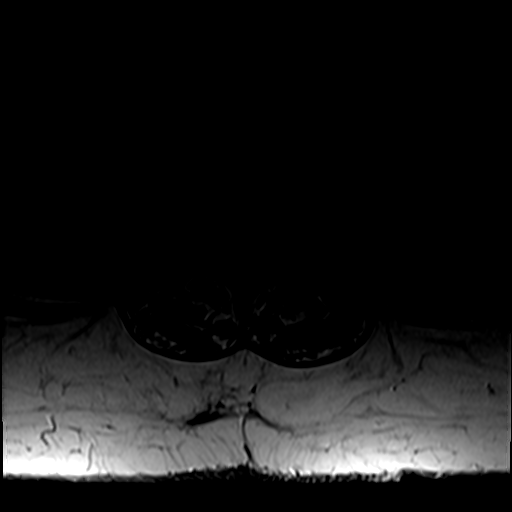
[im 6/34]
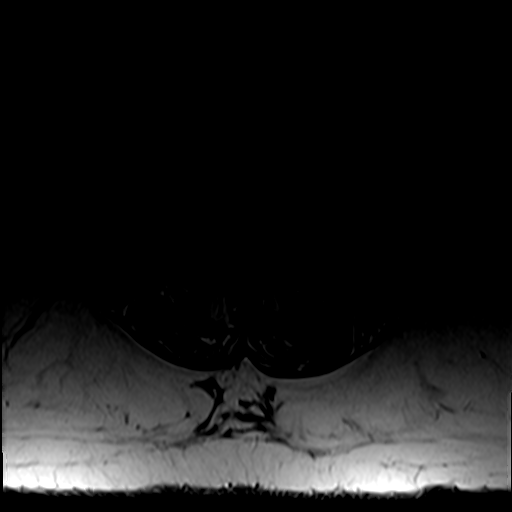
[im 11/34]
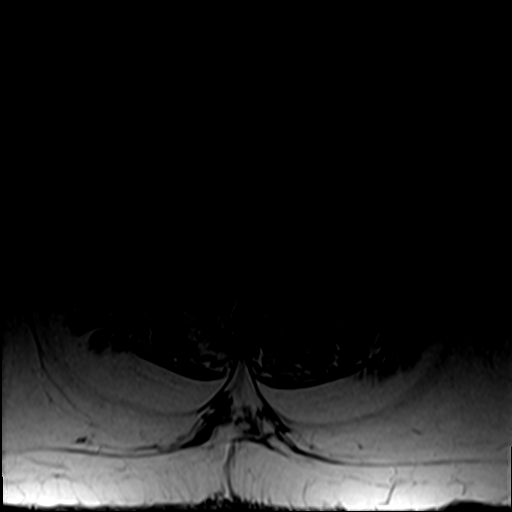
[im 16/34]
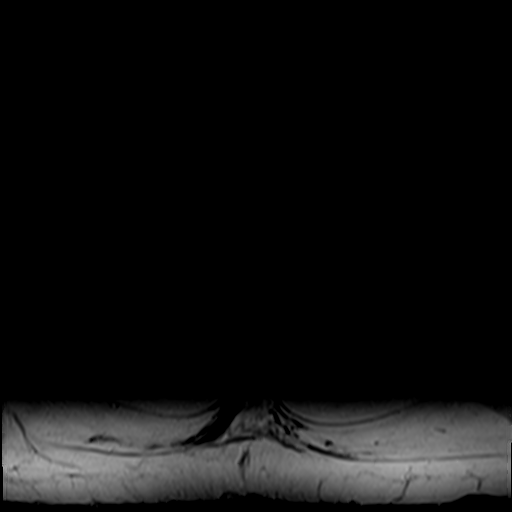
[im 18/34]
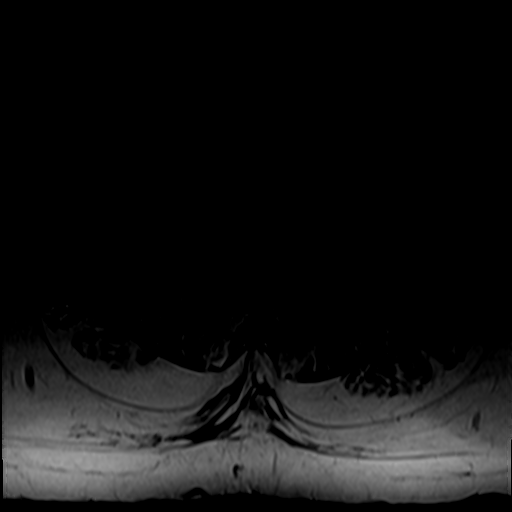
[im 23/34]
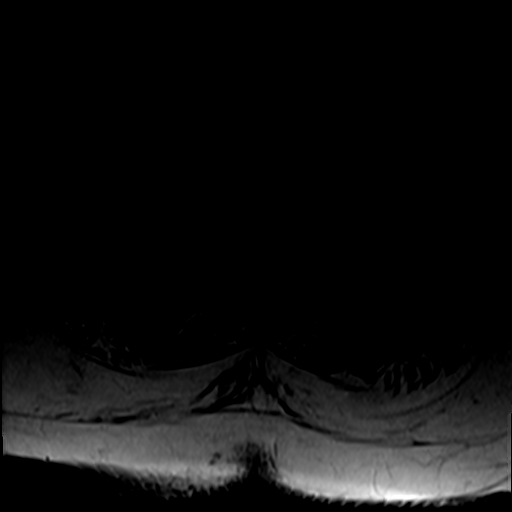
[im 28/34]
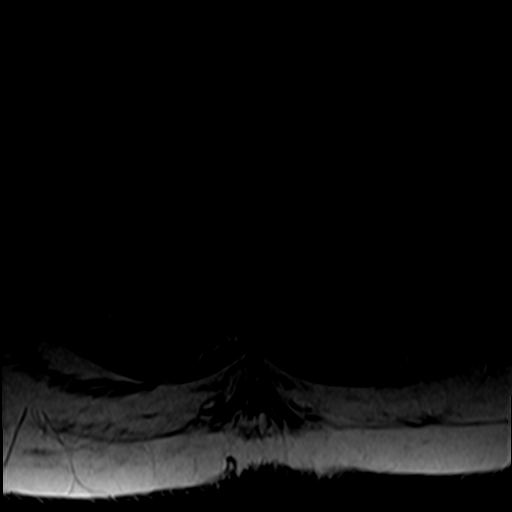
[im 34/34]
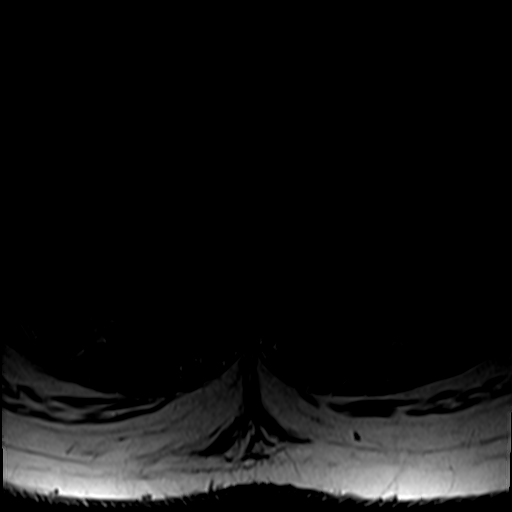

[Series 100: STIR · sagittal · 4.0mm · 1.02mm/px · 7 of 17 slices shown]
[im 1/17]
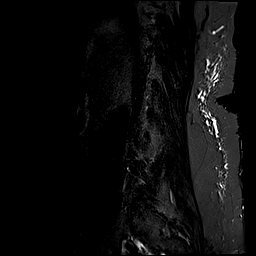
[im 3/17]
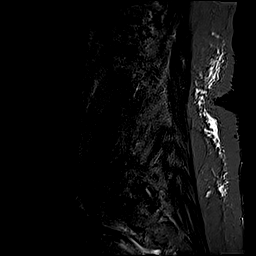
[im 6/17]
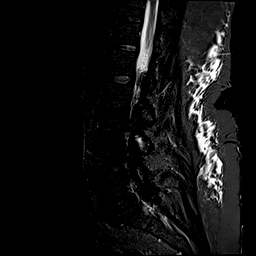
[im 9/17]
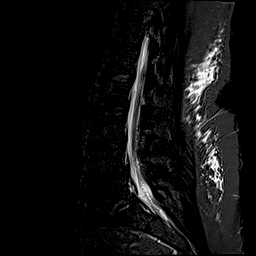
[im 11/17]
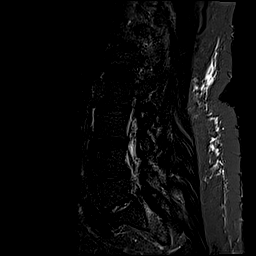
[im 14/17]
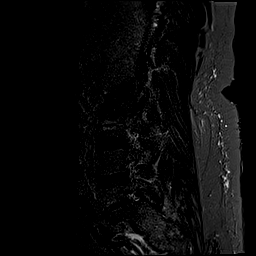
[im 17/17]
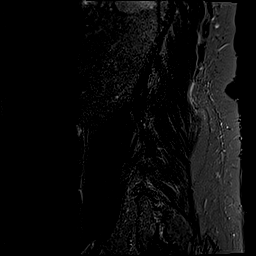

[38 of 48 positions shown; findings below may reference images not displayed]

FINDINGS: Normal signal is present in the conus medullaris which terminates at
L1-2. Levoconvex curvature lumbar spine is centered at L4. Chronic
endplate marrow changes are most evident on the right at L4-5 but
also seen on the left at L5-S1 and on the right at L3-4. Vertebral
body heights are maintained. Grade 1 anterolisthesis is present at
L5-S1.

Limited imaging of the abdomen is within normal limits. No
significant adenopathy is present.

L1-2:  Negative.

L2-3: Mild facet hypertrophy is present bilaterally without
significant stenosis.

L3-4: A rightward disc protrusion is present. Asymmetric right-sided
facet hypertrophy is noted. Mild right subarticular narrowing is
present. Mild to moderate right and mild left foraminal narrowing is
present.

L4-5: A rightward disc protrusion is present. Asymmetric right-sided
facet hypertrophy is noted. Mild right subarticular narrowing is
noted. Mild foraminal narrowing is present bilaterally.

L5-S1: A broad-based disc protrusion is present. Moderate facet
hypertrophy is slightly worse on the left. Mild subarticular
narrowing is present bilaterally. Moderate left and mild right
foraminal stenosis is present.
IMPRESSION: 1. Levoconvex curvature of the lumbar spine is present at L4.
2. Mild right subarticular narrowing at L3-4.
3. Mild to moderate right and mild left foraminal narrowing at L3-4.
4. Mild right subarticular and bilateral foraminal narrowing at
L4-5.
5. Mild subarticular narrowing at L5-S1 bilaterally with moderate
left and mild right foraminal stenosis.

## 2017-08-07 DIAGNOSIS — J9611 Chronic respiratory failure with hypoxia: Secondary | ICD-10-CM | POA: Insufficient documentation

## 2017-08-07 DIAGNOSIS — G2581 Restless legs syndrome: Secondary | ICD-10-CM | POA: Insufficient documentation

## 2017-08-07 DIAGNOSIS — G4733 Obstructive sleep apnea (adult) (pediatric): Secondary | ICD-10-CM | POA: Insufficient documentation

## 2017-08-09 ENCOUNTER — Ambulatory Visit (INDEPENDENT_AMBULATORY_CARE_PROVIDER_SITE_OTHER): Payer: Medicare Other | Admitting: Surgery

## 2017-08-09 ENCOUNTER — Encounter: Payer: Self-pay | Admitting: Surgery

## 2017-08-09 VITALS — BP 127/81 | HR 103 | Temp 98.4°F | Ht 64.0 in

## 2017-08-09 DIAGNOSIS — D1721 Benign lipomatous neoplasm of skin and subcutaneous tissue of right arm: Secondary | ICD-10-CM | POA: Diagnosis not present

## 2017-08-09 NOTE — Patient Instructions (Addendum)
We have your CT scan scheduled for 08/20/17 @ 11 am. Nothing to eat/drink 4 hours prior to test.  Please see follow up appointment listed below.

## 2017-08-13 ENCOUNTER — Encounter: Payer: Self-pay | Admitting: Surgery

## 2017-08-13 NOTE — Progress Notes (Signed)
Patient ID: Loretta Wade, female   DOB: [birth year], 67 y.o.   MRN: [id number]  HPI Loretta Wade is a 67 y.o. female seen in consultation at the request of Dr. Lovie Macadamia for right arm soft tissue mass. Comes in a stretcher was brought by EMS.  She is got significant comorbidities including COPD on home oxygen, CHF, orbit obesity. Reports she noticed a new right arm fullness for several months.  Because of some intermittent discomfort and some mild dull pain.  No specific alleviating or aggravating factors. Is very debilitating and is in a nursing home. No family history of liposarcomas or soft tissue sarcomas.  HPI  Past Medical History:  Diagnosis Date  . Anemia   . CHF (congestive heart failure) ([place])   . Chronic back pain   . COPD (chronic obstructive pulmonary disease) (H[facility])    on 2L home o2  . Edema   . Esophageal reflux   . Fatigue   . Fibromyalgia    Following with pain management  . Hypertension   . Nocturia   . OA (osteoarthritis)   . Sleep apnea    not on CPAP    Past Surgical History:  Procedure Laterality Date  . ABDOMINAL HYSTERECTOMY    . CHOLECYSTECTOMY    . TOTAL KNEE ARTHROPLASTY     right  . VESICOVAGINAL FISTULA CLOSURE W/ TAH      Family History  Problem Relation Age of Onset  . Hypertension Mother   . Diabetes Unknown   . Breast cancer Daughter     Social History Social History   Tobacco Use  . Smoking status: Former Smoker    Last attempt to quit: 11/04/1983    Years since quitting: 33.7  . Smokeless tobacco: Never Used  . Tobacco comment: quit several years ago - almost 30 years ago  Substance Use Topics  . Alcohol use: No    Alcohol/week: 0.0 oz  . Drug use: No    Allergies  Allergen Reactions  . Gabapentin     Pt reports dizziness and abnormal urine labs.  . Tramadol Nausea And Vomiting  . Vioxx [Rofecoxib]     No reaction listed on MAR.    Current Outpatient Medications  Medication Sig Dispense Refill  .  acetaminophen (TYLENOL) 325 MG tablet Take 2 tablets (650 mg total) by mouth every 6 (six) hours as needed for mild pain (or Fever >/= 101). 30 tablet 0  . albuterol (PROVENTIL) (2.5 MG/3ML) 0.083% nebulizer solution USE ONE TREATMENT EVERY 6 HOURS AS NEEDED FOR WHEEZING OR SHORTNESS OF BREATH 360 mL 0  . ANORO ELLIPTA 62.5-25 MCG/INH AEPB Inhale 1 puff into the lungs daily.    . chlorpheniramine-HYDROcodone (TUSSIONEX) 10-8 MG/5ML SUER Take 5 mLs by mouth every 12 (twelve) hours as needed for cough. 115 mL 0  . cholecalciferol (VITAMIN D) 1000 UNITS tablet Take 1,000 Units by mouth daily.     . citalopram (CELEXA) 20 MG tablet TAKE ONE (1) TABLET BY MOUTH EVERY DAY 30 tablet 0  . colesevelam (WELCHOL) 625 MG tablet TAKE ONE TABLET TWICE A DAY WITH FOOD 60 tablet 0  . cyanocobalamin 500 MCG tablet Take 500 mcg by mouth daily.    Marland Kitchen dexlansoprazole (DEXILANT) 60 MG capsule TAKE ONE (1) CAPSULE EACH DAY 30 capsule 0  . diclofenac sodium (VOLTAREN) 1 % GEL Apply 4 g topically 4 (four) times daily.    . fluticasone (FLONASE) 50 MCG/ACT nasal spray USE TWO SPRAYS IN EACH NOSTRIL EVERY  DAY 16 g 0  . furosemide (LASIX) 40 MG tablet Take 1 tablet (40 mg total) by mouth 2 (two) times daily. 30 tablet 60  . guaiFENesin (MUCINEX) 600 MG 12 hr tablet Take 1 tablet (600 mg total) by mouth 2 (two) times daily. 20 tablet 0  . HYDROcodone-acetaminophen (NORCO/VICODIN) 5-325 MG tablet Take 1 tablet by mouth every 6 (six) hours as needed for moderate pain.    Marland Kitchen HYDROcodone-homatropine (HYCODAN) 5-1.5 MG/5ML syrup hydrocodone-homatropine 5 mg-1.5 mg/5 mL oral syrup  Take 5 mL every 6 hours by oral route as needed.    . hydrocortisone cream 1 % Apply 1 application topically 2 (two) times daily. Apply to rash on upper back.    Marland Kitchen ipratropium-albuterol (DUONEB) 0.5-2.5 (3) MG/3ML SOLN Take 3 mLs by nebulization every 6 (six) hours as needed. 360 mL 0  . loperamide (IMODIUM) 2 MG capsule Take 4 mg by mouth daily as needed  for diarrhea or loose stools.     Marland Kitchen LORazepam (ATIVAN) 0.5 MG tablet     . Magnesium 250 MG TABS Take 250 mg by mouth daily.    Marland Kitchen nystatin (MYCOSTATIN) 100000 UNIT/ML suspension Take 5 mLs (500,000 Units total) by mouth 4 (four) times daily. 180 mL 0  . omeprazole (PRILOSEC) 20 MG capsule Take 20 mg by mouth daily.    Marland Kitchen oxybutynin (DITROPAN) 5 MG tablet TAKE ONE (1) TABLET BY MOUTH EVERY DAY 30 tablet 0  . oxybutynin (DITROPAN-XL) 10 MG 24 hr tablet Take 10 mg by mouth daily.    . polyethylene glycol (MIRALAX / GLYCOLAX) packet Take 17 g daily by mouth. 14 each 0  . predniSONE (DELTASONE) 20 MG tablet Take 2 tablets (40 mg total) by mouth daily. 8 tablet 0  . pregabalin (LYRICA) 150 MG capsule Take 1 capsule (150 mg total) by mouth 2 (two) times daily. 60 capsule 0  . Probiotic Product (ALIGN) 4 MG CAPS Take 4 mg by mouth daily.    Marland Kitchen rOPINIRole (REQUIP) 0.25 MG tablet Take 3 tablets (0.75 mg total) by mouth at bedtime. 90 tablet 0   No current facility-administered medications for this visit.      Review of Systems Full ROS  was asked and was negative except for the information on the HPI  Physical Exam Blood pressure 127/81, pulse (!) 103, temperature 98.4 F (36.9 C), temperature source Oral, height 5\' 4"  (1.626 m). CONSTITUTIONAL: Debilitated female chronically ill.  Comes via EMS in a stretcher. EYES: Pupils are equal, round, and reactive to light, Sclera are non-icteric. GI: The abdomen is  soft, nontender, and nondistended. There are no palpable masses. There is no hepatosplenomegaly. There are normal bowel sounds in all quadrants. GU: Rectal deferred.   MUSCULOSKELETAL: Normal muscle strength and tone. No cyanosis or edema.   SKIN: Turgor is good and there are no pathologic skin lesions or ulcers. A large subcutaneous tissue mass on the right arm measuring approximately 15 x 15 cm is mobile and mildly tender to palpation.  NEUROLOGIC: Motor and sensation is grossly normal.  Cranial nerves are grossly intact. PSYCH:  Oriented to person, place and time. Affect is normal.  Data Reviewed  I have personally reviewed the patient's imaging, laboratory findings and medical records.    Assessment/Plan  68 year old morbidly obese female with multiple comorbidities and debilitated presents with a large right arm lipoma. discussed with the patient in detail and we will obtain a CT scan of her arm to rule out the possibility of liposarcoma.  After that is performed then will talk to her and had a more detailed discussion about potential surgical intervention versus observation depending on findings on imaging studies. No need for emergent surgical intervention at this time. Copy of this report will be sent to the referring provider   Caroleen Hamman, MD FACS General Surgeon 08/13/2017, 2:19 AM

## 2017-08-20 ENCOUNTER — Ambulatory Visit: Admission: RE | Admit: 2017-08-20 | Payer: Medicaid Other | Source: Ambulatory Visit

## 2017-08-21 ENCOUNTER — Telehealth: Payer: Self-pay | Admitting: Surgery

## 2017-08-21 NOTE — Telephone Encounter (Signed)
Spoke with patient and she stated she had an ear infection and a UTI and that's why she didn't;t have the CT scan done 08/20/17.   I called [facility] facility and spoke with Tiffany and let her know the appointment information below.   CT scan 08/29/17 @ 1:45 am , NPO 4 hours prior. Dr.Pabon 08/31/17. If patient does not have CT scan done then she will need to cancel the appointment with Dr.Pabon.

## 2017-08-21 NOTE — Telephone Encounter (Signed)
Ct called informing us the patient did not show up for ct scan appointment. They were just calling to inform us since the patient is due to come in next week for a follow up on her ct scan. Can you call the patient and find out if she is going to r/s her missed appointment. Please call patient and advise.

## 2017-08-29 ENCOUNTER — Ambulatory Visit
Admission: RE | Admit: 2017-08-29 | Discharge: 2017-08-29 | Disposition: A | Discharge status: Home / Self Care | Payer: Medicare Other | Source: Ambulatory Visit | Attending: Surgery | Admitting: Surgery

## 2017-08-29 DIAGNOSIS — D1721 Benign lipomatous neoplasm of skin and subcutaneous tissue of right arm: Secondary | ICD-10-CM | POA: Diagnosis present

## 2017-08-29 LAB — POCT I-STAT CREATININE: CREATININE: 0.6 mg/dL (ref 0.44–1.00)

## 2017-08-29 MED ORDER — IOPAMIDOL (ISOVUE-300) INJECTION 61%
100.0000 mL | Freq: Once | INTRAVENOUS | Status: AC | PRN
  Administered 2017-08-29: 100 mL via INTRAVENOUS

## 2017-08-31 ENCOUNTER — Ambulatory Visit (INDEPENDENT_AMBULATORY_CARE_PROVIDER_SITE_OTHER): Payer: Medicare Other | Admitting: Surgery

## 2017-08-31 ENCOUNTER — Encounter: Payer: Self-pay | Admitting: Surgery

## 2017-08-31 VITALS — BP 130/77 | HR 97 | Ht 64.0 in

## 2017-08-31 DIAGNOSIS — D1721 Benign lipomatous neoplasm of skin and subcutaneous tissue of right arm: Secondary | ICD-10-CM

## 2017-08-31 NOTE — Patient Instructions (Addendum)
Please see your follow up appointment listed below.  °

## 2017-08-31 NOTE — Progress Notes (Signed)
Outpatient Surgical Follow Up  08/31/2017  Loretta Wade is an 67 y.o. female.   Chief Complaint  Patient presents with  . Follow-up    HPI: Loretta Wade is following up for right arm mass.  CT scan personally reviewed and there is evidence of a large lipoma discussed with her in detail.  There is evidence of an large lipoma without any sarcomatous nature. She is still debilitated and unable to walk yet. Comes in a stretcher with EMS She reports some intermittent mild to moderate pain on the right arm.  No specific alleviating or aggravating factors.  Pain is dull and does not radiate  Past Medical History:  Diagnosis Date  . Anemia   . CHF (congestive heart failure) ([place])   . Chronic back pain   . COPD (chronic obstructive pulmonary disease) ([facility])    on 2L home o2  . Edema   . Esophageal reflux   . Fatigue   . Fibromyalgia    Following with pain management  . Hypertension   . Nocturia   . OA (osteoarthritis)   . Sleep apnea    not on CPAP    Past Surgical History:  Procedure Laterality Date  . ABDOMINAL HYSTERECTOMY    . CHOLECYSTECTOMY    . TOTAL KNEE ARTHROPLASTY     right  . VESICOVAGINAL FISTULA CLOSURE W/ TAH      Family History  Problem Relation Age of Onset  . Hypertension Mother   . Diabetes Unknown   . Breast cancer Daughter     Social History:  reports that she quit smoking about 33 years ago. She has never used smokeless tobacco. She reports that she does not drink alcohol or use drugs.  Allergies:  Allergies  Allergen Reactions  . Gabapentin     Pt reports dizziness and abnormal urine labs.  . Tramadol Nausea And Vomiting  . Vioxx [Rofecoxib]     No reaction listed on MAR.    Medications reviewed.    ROS Full ROS performed and is otherwise negative other than what is stated in HPI   BP 130/77   Pulse 97   Ht 5\' 4"  (1.626 m)   BMI 57.50 kg/m   Physical Exam  Constitutional: She is oriented to person, place, and time. No  distress.  Dilatated obese female on home oxygen  Abdominal: Soft. She exhibits no distension and no mass. There is no tenderness. There is no rebound and no guarding.  Musculoskeletal:  There is a large right  upper arm lipoma.  Nontender to palpation.  Neurological: She is alert and oriented to person, place, and time. She displays normal reflexes. No cranial nerve deficit. Coordination normal.  Skin: Skin is warm and dry. Capillary refill takes less than 2 seconds.  Psychiatric: She has a normal mood and affect. Her behavior is normal. Judgment and thought content normal.  Nursing note and vitals reviewed.      Results for orders placed or performed during the hospital encounter of 08/29/17 (from the past 48 hour(s))  I-STAT creatinine     Status: None   Collection Time: 08/29/17  2:10 PM  Result Value Ref Range   Creatinine, Ser 0.60 0.44 - 1.00 mg/dL   Ct Extrem Up Entire Arm R W/cm  Result Date: 08/30/2017 CLINICAL DATA:  Right upper extremity soft tissue mass. EXAM: CT OF THE UPPER RIGHT EXTREMITY WITH CONTRAST TECHNIQUE: Multidetector CT imaging of the upper right extremity was performed according to the standard protocol following  intravenous contrast administration. COMPARISON:  None. CONTRAST:  124mL ISOVUE-300 IOPAMIDOL (ISOVUE-300) INJECTION 61% FINDINGS: Bones/Joint/Cartilage No fracture or dislocation. Normal alignment. No joint effusion. Mild osteoarthritis of glenohumeral joint. Mild osteoarthritis of the radiocapitellar joint and ulnohumeral joint. Mild arthropathy of the acromioclavicular joint. Ligaments Ligaments are suboptimally evaluated by CT. Muscles and Tendons No muscle atrophy. Soft tissue No fluid collection or hematoma. 7.2 x 8.8 x 15.4 cm fat attenuating mass along the posterolateral aspect of the right upper arm partially within the subcutaneous fat and partially involving the lateral head of the triceps muscle most consistent with an intramuscular lipoma. No  thickened septations or mural nodule. No other soft tissue mass. Incidental note made of small right mastoid effusion. IMPRESSION: 1. Large 7.2 x 8.8 x 15.4 cm simple lipoma involving the posterolateral aspect of the right upper arm. Electronically Signed   By: Kathreen Devoid   On: 08/30/2017 08:22    Assessment/Plan:  1. Lipoma of right upper extremity discussed with the patient in detail about CT findings.  There is no evidence of any malignancy or sarcomatous nature of her soft tissue arm mass.  Given that the patient is debilitated I will want to make sure that she is optimized from medical perspective.  I will see her back in 3 months and see how she is doing she is still not ambulatory and is still having significant dyspnea on exertion. No need for emergent surgical intervention at this time.  Plan for reevaluation in 3 months  Caroleen Hamman, MD [address] Surgeon

## 2017-09-20 ENCOUNTER — Other Ambulatory Visit: Payer: Self-pay | Admitting: Family Medicine

## 2017-09-20 DIAGNOSIS — Z1231 Encounter for screening mammogram for malignant neoplasm of breast: Secondary | ICD-10-CM

## 2017-11-13 ENCOUNTER — Telehealth: Payer: Self-pay | Admitting: *Deleted

## 2017-11-13 NOTE — Telephone Encounter (Signed)
Patient called and stated that the last time she was here Dr.Pabon gave her a letter for physical therapy and she has missed placed it and wanted another one.

## 2017-11-13 NOTE — Telephone Encounter (Signed)
Spoke with patient and let her know that I do not see a letter for PT. She said she brought paperwork from the assisted living facility and we can only assume that it was written on the plan.  However she has a follow up appointment on 11/21/17 and we can discuss this with Dr.Pabon at this time. Patient agreed.

## 2017-11-21 ENCOUNTER — Ambulatory Visit: Payer: Medicare Other | Admitting: Surgery

## 2018-02-08 ENCOUNTER — Non-Acute Institutional Stay: Payer: Medicaid Other | Admitting: Nurse Practitioner

## 2018-02-08 VITALS — BP 124/68 | HR 78 | Temp 98.0°F | Resp 18 | Wt 313.0 lb

## 2018-02-08 DIAGNOSIS — R531 Weakness: Secondary | ICD-10-CM

## 2018-02-08 DIAGNOSIS — G894 Chronic pain syndrome: Secondary | ICD-10-CM

## 2018-02-08 DIAGNOSIS — R0602 Shortness of breath: Secondary | ICD-10-CM

## 2018-02-08 DIAGNOSIS — Z515 Encounter for palliative care: Secondary | ICD-10-CM

## 2018-02-08 NOTE — Progress Notes (Signed)
[facility] Telephone: [phone] Fax: [phone]  PATIENT NAME: Loretta Wade DOB: April 13, 1950 MRN: [id number]  PRIMARY CARE PROVIDER:   Dr Abner Greenspan REFERRING PROVIDER: Dr Abner Greenspan RESPONSIBLE PARTY:   [facility]  ASSESSMENT:     I visited and observed Loretta Wade. We talked about purpose for palliative medicine visit and she consent to agreement. We talked about how she was feeling today. She verbalize that she is concerned about the Mass on the left side of her neck. We talked about symptoms of pain when she currently denies. We talked about options of which [facility] nurse practitioner did offered ENT appointments but she deferred due to cost and unwillingness to be transported out of [place]. She would require EMS to be transported. We talked about mobility and her last palliative discussion encouraging Loretta Wade to be out of bed. Loretta Wade endorses that she is working with the therapy department to try to come up with a schedule to get her out of bed more frequently. Encourage Loretta Wade to get out of bed daily but she declined. She verbalize that she will attempt to get out of bed three times a week. She was very concerned about the amount of time that she would have to sit up. She verbalize that she would not be able nor willing to sit at more than two hours three times a week. We talked at length about the importance of mobility benefiting chronic disease progression, obesity, symptoms and overall Wellness. Loretta Gene endorses she understands that it could help and she is willing to try but on her terms. She was also open to an ENT referral. We talked about symptoms of shortness of breath with her chronic. We talked about quality of life. Her wishes are to continue medical goals aggressively including rehospitalization if necessary. She talked about challenges residing in Skilled Nursing Facility, lack of Independence. We Revisited mobility and in courage to  continue working with psychotherapy including coping strategies. Therapeutic listening and emotional support provided. At present time discuss she is stable. Discuss that will follow up in one month after ENT referral is completed and to see if she's made any progress in her goals of Mobility. Loretta Wade and agreement. I updated [facility] nurse practitioner and staff.  RECOMMENDATIONS and PLAN:  1.Generalized weakness R53.1  secondary to obesity, CHF continue with therapy as able. Encourage energy conservation and rest times.  2. Dyspneic R06.00 secondary to congestive heart failure remain stable at present time. Continue daily weights   3. Chronic pain G89.29 monitor on pain scale, monitor efficacy vs adverse side effects. Currently taking   4. Palliative care encounter Z51.5; Ongoing monitoring chronic disease management, symptoms, goc with emotional support.  I spent 60 minutes providing this consultation,  from 9:00am to 10:00am. More than 50% of the time in this consultation was spent coordinating communication.   HISTORY OF PRESENT ILLNESS:  Loretta Wade is a 67 y.o. year old female with multiple medical problems including congestive heart failure, morbid obesity, for full vascular disease, anemia, chronic back pain, COPD, on oxygen continuous, fibromyalgia, gerd, hypertension, osteoarthritis, obstructive sleep apnea, hyperlipidemia, opiate dependent, adjustment disorder, major depression with hallucinations, vitamin D deficiency. She resides in skilled long-term care facility secondary to obesity, COPD and O2 dependence. She does transferred to the wheelchair but often refuses per staff to get up. She is total ADL assistance. She is able to feed herself and appetite has been Fair. She does verbalize her needs.  10 / 3 / 2019 Initial palliative medicine visit at [facility] although patient while known for multiple stents on palliative medicine. Goals of care clarify that this  visit at present time remains full code and aggressive interventions. Symptoms managed dyspnea which was stable at that time. No recent Falls, wounds, hospitalizations. She is followed at the facility by Psychiatry with last date of service 85 / 7 / 2019 for anxiety and mood disorder currently on Celexa. No changes to medication [facility] at this visit. She also is followed by Psychotherapy with last visit 11/26 flash 2019. She does remaining full code. Last primary provider note 11 / 1 / 2018 for COPD with cough chest x-ray negative for pneumonia. She was prescribed antibiotics for parotitis. The mass has not resolved and she was requesting further diagnostic testing. Primary provider offered ENT referral but Loretta Wade declined due to an outstanding bill she is not able to pay. She requested to be transferred to the hospital but primary is working with her upon  a referral although Loretta Wade deferred going outside of [place]. She is her own responsible party and decision-maker. She verbalizes her needs. At present Loretta. Wade is lying in bed. She appears obese, debilitated, chronically ill but appears comfortable. No visitors present.*. Palliative Care was asked to help address goals of care.   CODE STATUS: full code  PPS: 40% HOSPICE ELIGIBILITY/DIAGNOSIS: TBD  PAST MEDICAL HISTORY:  Past Medical History:  Diagnosis Date  . Anemia   . CHF (congestive heart failure) ([facility])   . Chronic back pain   . COPD (chronic obstructive pulmonary disease) ([facility])    on 2L home o2  . Edema   . Esophageal reflux   . Fatigue   . Fibromyalgia    Following with pain management  . Hypertension   . Nocturia   . OA (osteoarthritis)   . Sleep apnea    not on CPAP    SOCIAL HX:  Social History   Tobacco Use  . Smoking status: Former Smoker    Last attempt to quit: 11/04/1983    Years since quitting: 34.2  . Smokeless tobacco: Never Used  . Tobacco comment: quit several years ago - almost 30  years ago  Substance Use Topics  . Alcohol use: No    Alcohol/week: 0.0 standard drinks    ALLERGIES:  Allergies  Allergen Reactions  . Gabapentin     Pt reports dizziness and abnormal urine labs.  . Tramadol Nausea And Vomiting  . Vioxx [Rofecoxib]     No reaction listed on MAR.     PERTINENT MEDICATIONS:  Outpatient Encounter Medications as of 02/08/2018  Medication Sig  . acetaminophen (TYLENOL) 325 MG tablet Take 2 tablets (650 mg total) by mouth every 6 (six) hours as needed for mild pain (or Fever >/= 101).  Marland Kitchen albuterol (PROVENTIL) (2.5 MG/3ML) 0.083% nebulizer solution USE ONE TREATMENT EVERY 6 HOURS AS NEEDED FOR WHEEZING OR SHORTNESS OF BREATH  . ANORO ELLIPTA 62.5-25 MCG/INH AEPB Inhale 1 puff into the lungs daily.  . chlorpheniramine-HYDROcodone (TUSSIONEX) 10-8 MG/5ML SUER Take 5 mLs by mouth every 12 (twelve) hours as needed for cough.  . cholecalciferol (VITAMIN D) 1000 UNITS tablet Take 1,000 Units by mouth daily.   . citalopram (CELEXA) 20 MG tablet TAKE ONE (1) TABLET BY MOUTH EVERY DAY  . colesevelam (WELCHOL) 625 MG tablet TAKE ONE TABLET TWICE A DAY WITH FOOD  . cyanocobalamin 500 MCG tablet Take 500 mcg  by mouth daily.  Marland Kitchen dexlansoprazole (DEXILANT) 60 MG capsule TAKE ONE (1) CAPSULE EACH DAY  . diclofenac sodium (VOLTAREN) 1 % GEL Apply 4 g topically 4 (four) times daily.  . fluticasone (FLONASE) 50 MCG/ACT nasal spray USE TWO SPRAYS IN EACH NOSTRIL EVERY DAY  . furosemide (LASIX) 40 MG tablet Take 1 tablet (40 mg total) by mouth 2 (two) times daily.  Marland Kitchen guaiFENesin (MUCINEX) 600 MG 12 hr tablet Take 1 tablet (600 mg total) by mouth 2 (two) times daily.  Marland Kitchen HYDROcodone-acetaminophen (NORCO/VICODIN) 5-325 MG tablet Take 1 tablet by mouth every 6 (six) hours as needed for moderate pain.  Marland Kitchen HYDROcodone-homatropine (HYCODAN) 5-1.5 MG/5ML syrup hydrocodone-homatropine 5 mg-1.5 mg/5 mL oral syrup  Take 5 mL every 6 hours by oral route as needed.  . hydrocortisone cream 1  % Apply 1 application topically 2 (two) times daily. Apply to rash on upper back.  Marland Kitchen ipratropium-albuterol (DUONEB) 0.5-2.5 (3) MG/3ML SOLN Take 3 mLs by nebulization every 6 (six) hours as needed.  . loperamide (IMODIUM) 2 MG capsule Take 4 mg by mouth daily as needed for diarrhea or loose stools.   Marland Kitchen LORazepam (ATIVAN) 0.5 MG tablet   . Magnesium 250 MG TABS Take 250 mg by mouth daily.  Marland Kitchen nystatin (MYCOSTATIN) 100000 UNIT/ML suspension Take 5 mLs (500,000 Units total) by mouth 4 (four) times daily.  Marland Kitchen omeprazole (PRILOSEC) 20 MG capsule Take 20 mg by mouth daily.  Marland Kitchen oxybutynin (DITROPAN) 5 MG tablet TAKE ONE (1) TABLET BY MOUTH EVERY DAY  . oxybutynin (DITROPAN-XL) 10 MG 24 hr tablet Take 10 mg by mouth daily.  . polyethylene glycol (MIRALAX / GLYCOLAX) packet Take 17 g daily by mouth.  . predniSONE (DELTASONE) 20 MG tablet Take 2 tablets (40 mg total) by mouth daily.  . pregabalin (LYRICA) 150 MG capsule Take 1 capsule (150 mg total) by mouth 2 (two) times daily.  . Probiotic Product (ALIGN) 4 MG CAPS Take 4 mg by mouth daily.  Marland Kitchen rOPINIRole (REQUIP) 0.25 MG tablet Take 3 tablets (0.75 mg total) by mouth at bedtime.   No facility-administered encounter medications on file as of 02/08/2018.     PHYSICAL EXAM:   General: NAD,obese, pleasant debilitated Cardiovascular: regular rate and rhythm Pulmonary: clear ant fields Abdomen: soft, nontender, + bowel sounds GU: no suprapubic tenderness Extremities: + edema, no joint deformities Skin: no rashes Neurological: functionally quadriplegic  Oshea Percival [place], NP

## 2018-03-28 ENCOUNTER — Non-Acute Institutional Stay: Payer: Medicare Other | Admitting: Nurse Practitioner

## 2018-03-28 ENCOUNTER — Encounter: Payer: Self-pay | Admitting: Nurse Practitioner

## 2018-03-28 VITALS — BP 111/61 | HR 77 | Temp 97.8°F | Resp 20 | Ht 65.0 in | Wt 306.6 lb

## 2018-03-28 DIAGNOSIS — R531 Weakness: Secondary | ICD-10-CM

## 2018-03-28 DIAGNOSIS — G894 Chronic pain syndrome: Secondary | ICD-10-CM

## 2018-03-28 DIAGNOSIS — R0602 Shortness of breath: Secondary | ICD-10-CM

## 2018-03-28 DIAGNOSIS — Z515 Encounter for palliative care: Secondary | ICD-10-CM

## 2018-03-28 NOTE — Progress Notes (Signed)
[facility] Telephone: [phone] Fax: [phone]  PATIENT NAME: Loretta Wade DOB: Nov 11, 1950 MRN: [id number]  PRIMARY CARE PROVIDER:   Dr Kathleen Lime PROVIDER:  Dr Hodges/[facility] RESPONSIBLE PARTY:   Self  ASSESSMENT:     I visited and observe Loretta Wade. We talked about purpose for palliative care visit and she verbally consented. Symptoms of pain apparently she denied. We talked about shortness of breath which does worsen with positioning and movement though she does remain dependent on 02. We talked about recent events with mass noted on her neck and she declined to follow up with ENT. We also talked about abdomen being more swollen. We talked about increase in ammonia level. Loretta Wade requested that her lactulose be increased. We talked about mechanism of lactulose and difference between acute episode with elevated ammonia levels versus stable with maintenance dose. She verbalized understanding though was unhappy that the dose was not able to be increased. We talked about residing and skilled Long-Term Care Nursing Facility. We talked about the importance of getting out of bed, mobility. She continued to decline to get out of bed though she shared that she did get up last week. We revisited last palliative care discussion about importance of mobility. We talked about importance of activities and social interactions. Loretta Wade endorses that she does understand. She did not mention snakes or any other type of hallucinations on this visit. Loretta Wade is her own responsible party. We talked about medical goals of care and she endorses she wants to be a full code and does not want to further talk about advance directives. Medical goals to consist of aggressive interventions. We talked about role of palliative care and plan of care. No new changes at present time. Discuss that will follow up in two months if needed or sooner should she declined.  Encouraged Loretta Wade to think about several goals we discussed including mobility, increase socialization, and compliance with medical care. Therapeutic listening and emotional support provided. Questions answered to satisfaction. Updated staff, Psychiatry and Psychotherapy  PC 12/04/2014 to 12/24/2014 PC 06/03/2015 to 06/24/2015 PC 07/28/2015 to 07/28/2015  PC 12/06/2017 Hospice 12/25/2014 to 03/23/2015  RECOMMENDATIONS and PLAN:  1.Generalized weaknessR53.1  secondary to obesity, CHF continue with therapy as able. Encourage energy conservation and rest times.  2. Dyspneic R06.00 secondary to congestive heart failure remain stable at present time. Continue daily weights   3. Chronic pain G89.29 monitor on pain scale, monitor efficacy vs adverse side effects. Currently taking   4. Palliative care encounter Z51.5; Ongoing monitoring chronic disease management, symptoms, goc with emotional support.  I spent 75 minutes providing this consultation,  from 11:00am to 12:15am. More than 50% of the time in this consultation was spent coordinating communication.   HISTORY OF PRESENT ILLNESS:  Loretta Wade is a 68 y.o. year old female with multiple medical problems including Congestive heart failure, morbid obesity, peripheral vascular disease, anemia, chronic back pain, COPD, oh too dependent, fibromyalgia, hypertension, obstructive sleep apnea, gerd, osteoarthritis, hyperlipidemia, opiate dependent, adjustment disorder, major depression with hallucinations, vitamin D deficiency. She does continue to reside in skilled long-term Nursing Care Facility, bed down and refuses to get up. She would require to be hoyer if she was an agreement. She's total ADL dependence as she has difficulty turning herself in the bed. She does feed herself and appetite has been good. No recent wounds, hospitalizations, Falls. Last palliative care visit 12 / 6 / 2019 for discussion  of mass on the left side of her neck with  concerns with following up at ent which she declined to go. ENT appointment offered at [facility] though she continued to decline to go. Ultrasound that was completed to the left side of her face indicated enlargement of parotid gland with no visible discrete mass. She was treated with antibiotic therapy with no adverse reaction. She was at this time looking forward to working with physical therapy, she was at baseline and non-compliant with Mobility. Last primary provider visit 1/8 / 2020 for follow-up lab work with increasing confusion and drowsiness with ammonia level noted to be elevated. Lactulose ordered and corrected. She was placed on the maintenance dose of lactulose. She does take requip for restless leg syndrome with improved symptoms. She takes Flonase and azelastine for allergies with loratadine. She does take align for probiotic. She is currently on brovana for COPD. Chronic pain she does remain on lidocaine patch, Voltaren gel in addition to Lyrica and Oxycodone 5 mg Q 6 hours as needed for severe pain. Blood pressures have been stable and she continues on Lasix. She is currently on Citalopram for depression. She is followed by Psychiatry and Psychotherapy at the facility for ongoing management of seeing snakes. She is also followed by Podiatry at the facility. Staff endorses that she does have intermittent complaints about roommates and has been moved several times. I present she is lying in bed, appears obese but comfortable. No visitors present. Palliative Care was asked to help address goals of care.   CODE STATUS: full  PPS: 30% HOSPICE ELIGIBILITY/DIAGNOSIS: TBD  PAST MEDICAL HISTORY:  Past Medical History:  Diagnosis Date  . Anemia   . CHF (congestive heart failure) (HCC)   . Chronic back pain   . COPD (chronic obstructive pulmonary disease) (HCC)    on 2L home o2  . Edema   . Esophageal reflux   . Fatigue   . Fibromyalgia    Following with pain management  . Hypertension   .  Nocturia   . OA (osteoarthritis)   . Sleep apnea    not on CPAP    SOCIAL HX:  Social History   Tobacco Use  . Smoking status: Former Smoker    Last attempt to quit: 11/04/1983    Years since quitting: 34.4  . Smokeless tobacco: Never Used  . Tobacco comment: quit several years ago - almost 30 years ago  Substance Use Topics  . Alcohol use: No    Alcohol/week: 0.0 standard drinks    ALLERGIES:  Allergies  Allergen Reactions  . Gabapentin     Pt reports dizziness and abnormal urine labs.  . Tramadol Nausea And Vomiting  . Vioxx [Rofecoxib]     No reaction listed on MAR.     PERTINENT MEDICATIONS:  Outpatient Encounter Medications as of 03/28/2018  Medication Sig  . acetaminophen (TYLENOL) 325 MG tablet Take 2 tablets (650 mg total) by mouth every 6 (six) hours as needed for mild pain (or Fever >/= 101).  Marland Kitchen albuterol (PROVENTIL) (2.5 MG/3ML) 0.083% nebulizer solution USE ONE TREATMENT EVERY 6 HOURS AS NEEDED FOR WHEEZING OR SHORTNESS OF BREATH  . ANORO ELLIPTA 62.5-25 MCG/INH AEPB Inhale 1 puff into the lungs daily.  . chlorpheniramine-HYDROcodone (TUSSIONEX) 10-8 MG/5ML SUER Take 5 mLs by mouth every 12 (twelve) hours as needed for cough.  . cholecalciferol (VITAMIN D) 1000 UNITS tablet Take 1,000 Units by mouth daily.   . citalopram (CELEXA) 20 MG tablet TAKE ONE (1)  TABLET BY MOUTH EVERY DAY  . colesevelam (WELCHOL) 625 MG tablet TAKE ONE TABLET TWICE A DAY WITH FOOD  . cyanocobalamin 500 MCG tablet Take 500 mcg by mouth daily.  Marland Kitchen dexlansoprazole (DEXILANT) 60 MG capsule TAKE ONE (1) CAPSULE EACH DAY  . diclofenac sodium (VOLTAREN) 1 % GEL Apply 4 g topically 4 (four) times daily.  . fluticasone (FLONASE) 50 MCG/ACT nasal spray USE TWO SPRAYS IN EACH NOSTRIL EVERY DAY  . furosemide (LASIX) 40 MG tablet Take 1 tablet (40 mg total) by mouth 2 (two) times daily.  Marland Kitchen guaiFENesin (MUCINEX) 600 MG 12 hr tablet Take 1 tablet (600 mg total) by mouth 2 (two) times daily.  Marland Kitchen  HYDROcodone-acetaminophen (NORCO/VICODIN) 5-325 MG tablet Take 1 tablet by mouth every 6 (six) hours as needed for moderate pain.  Marland Kitchen HYDROcodone-homatropine (HYCODAN) 5-1.5 MG/5ML syrup hydrocodone-homatropine 5 mg-1.5 mg/5 mL oral syrup  Take 5 mL every 6 hours by oral route as needed.  . hydrocortisone cream 1 % Apply 1 application topically 2 (two) times daily. Apply to rash on upper back.  Marland Kitchen ipratropium-albuterol (DUONEB) 0.5-2.5 (3) MG/3ML SOLN Take 3 mLs by nebulization every 6 (six) hours as needed.  . loperamide (IMODIUM) 2 MG capsule Take 4 mg by mouth daily as needed for diarrhea or loose stools.   Marland Kitchen LORazepam (ATIVAN) 0.5 MG tablet   . Magnesium 250 MG TABS Take 250 mg by mouth daily.  Marland Kitchen nystatin (MYCOSTATIN) 100000 UNIT/ML suspension Take 5 mLs (500,000 Units total) by mouth 4 (four) times daily.  Marland Kitchen omeprazole (PRILOSEC) 20 MG capsule Take 20 mg by mouth daily.  Marland Kitchen oxybutynin (DITROPAN) 5 MG tablet TAKE ONE (1) TABLET BY MOUTH EVERY DAY  . oxybutynin (DITROPAN-XL) 10 MG 24 hr tablet Take 10 mg by mouth daily.  . polyethylene glycol (MIRALAX / GLYCOLAX) packet Take 17 g daily by mouth.  . predniSONE (DELTASONE) 20 MG tablet Take 2 tablets (40 mg total) by mouth daily.  . pregabalin (LYRICA) 150 MG capsule Take 1 capsule (150 mg total) by mouth 2 (two) times daily.  . Probiotic Product (ALIGN) 4 MG CAPS Take 4 mg by mouth daily.  Marland Kitchen rOPINIRole (REQUIP) 0.25 MG tablet Take 3 tablets (0.75 mg total) by mouth at bedtime.   No facility-administered encounter medications on file as of 03/28/2018.     PHYSICAL EXAM:   General: obese, chronically ill, debilitated female Cardiovascular: regular rate and rhythm Pulmonary: clear ant fields Abdomen: +obese, soft, nontender, + bowel sounds GU: no suprapubic tenderness Extremities: + edema, no joint deformities Skin: no rashes Neurological: Weakness but otherwise nonfocal/functional quadriplegic  Jazzalynn Rhudy Prince [place], NP

## 2018-04-18 NOTE — Progress Notes (Signed)
This encounter was created in error - please disregard.

## 2018-04-18 NOTE — Telephone Encounter (Signed)
This encounter was created in error - please disregard.

## 2018-05-15 ENCOUNTER — Non-Acute Institutional Stay: Payer: Medicare Other | Admitting: Nurse Practitioner

## 2018-05-15 ENCOUNTER — Other Ambulatory Visit: Payer: Self-pay

## 2018-05-15 VITALS — BP 122/73 | HR 87 | Temp 97.4°F | Resp 18 | Wt 296.2 lb

## 2018-05-15 DIAGNOSIS — Z515 Encounter for palliative care: Secondary | ICD-10-CM

## 2018-05-15 DIAGNOSIS — R531 Weakness: Secondary | ICD-10-CM

## 2018-05-15 DIAGNOSIS — G894 Chronic pain syndrome: Secondary | ICD-10-CM

## 2018-05-15 DIAGNOSIS — R0602 Shortness of breath: Secondary | ICD-10-CM

## 2018-05-15 NOTE — Progress Notes (Signed)
Therapist, nutritional Palliative Care Consult Note Telephone: [phone]  Fax: [phone]  PATIENT NAME: Loretta Wade DOB: 1950-03-13 MRN: [id number]  PRIMARY CARE PROVIDER:   Annita Brod, MD  REFERRING PROVIDER:  Annita Brod, MD [address] [place], [place] [address]  RESPONSIBLE PARTY:   Self  RECOMMENDATIONS and PLAN: 1.Generalized weaknessR53.1 secondary to obesity, CHFcontinue with therapy as able. Encourage energy conservation and rest times.  2.Dyspneic R06.00 secondary to congestive heart failure remain stable at present time. Continue daily weights  3.Chronic pain G89.29 monitor on pain scale, monitor efficacy vs adverse side effects. Currently taking  4.Palliative care encounter Z51.5; Ongoing monitoring chronic disease management, symptoms, goc with emotional support.  ASSESSMENT:     Loretta Meth NP student and I visited and observed for a follow up from previous visit on 03/28/2018. Patient is seen laying in bed on 2L [place] in no acute distress. Patient states that she has been doing "ok" overall. States her mood has been doing well and that she is happy with her current regimen of Celexa 10mg  daily. Patient denies any depression or SI currently. She remains paranoid, insinuating that there were people listening to everything that she says from a device near her television, none were present on inspection, as well as her roommate reporting to the staff everything that she says. Currently denies any hallucinations. Denies any confusion and says she is more clear since stating lactulose and rifaximin. Claiming to be motivated to work with physical therapy in order to get a motorized wheelchair because she refusing to be in a regular wheelchair, but patient refuses on each physical therapy encounter. Patient's respiratory status is stable on 2L O2 [place]. She states that she can have an increased work of breathing in the evening, but  denies it causing any increased anxiety or not allowing her to sleep. Patient educated on repositioning that could improve her breathing, as patient is currently laying flat at 15 degrees greatly reducing her ability to ventilate. Patient also attests to a dry cough in the evenings. When asked, she says that she does not ask for her PRN cough medicine as she knows that the nurses will not bring them to her. Patient encouraged to ask for her medicine and if they do not bring them to simply continue asking. Patient continues to have chronic pain, stating that it is generalized but her back is the most bothersome. Pain controlled transiently with Oxycodone 5mg  q6 PRN, but patient states it does not last long enough and is currently taking all 4 PRN doses daily. Fibromyalgia pain well controlled on pregabalin 200mg  BID. Abdominal pain and swelling has improved per patient since starting the lactulose and rifaximin. Patient also states her left facial swelling has improved with only mild tenderness. Patient expressed concerns regarding her urinary incontinence saying it has worsened and wanted reassurance that she was still receiving her Oxybutinin, which was provided. Discussion with the patient regarding non-compliance with ENT referral completed. Per patient it was due to her not wanting to be in a wheelchair and if the visit could have been conducted on a stretcher the patient would have cooperated. Patient also mentioned a concern regarding her financial situation, but would not elaborate. Therapeutic listening completed and emotional support provided.  PC 12/04/2014 to 12/24/2014 PC 06/03/2015 to 06/24/2015 PC 07/28/2015 to 07/28/2015  PC 12/06/2017 Hospice 12/25/2014 to 03/23/2015  I spent 60 minutes providing this consultation,  from 1:00pm to 2:00pm. More than 50% of the  time in this consultation was spent coordinating communication.   HISTORY OF PRESENT ILLNESS:  Loretta Wade is a 68 y.o. year old  female with multiple medical problems includingCongestive heart failure, morbid obesity, peripheral vascular disease, anemia, chronic back pain, COPD, oh too dependent, fibromyalgia, hypertension, obstructive sleep apnea, gerd, osteoarthritis, hyperlipidemia, opiate dependent, adjustment disorder, major depression with hallucinations, vitamin D deficiency. She does continue to reside in skilled long-term Nursing Care Facility, bed down and refuses to get up. She would require to be hoyer if she was an agreement. She's total ADL dependence as she has difficulty turning herself in the bed. She does feed herself and appetite has been good. No recent wounds, hospitalizations, Falls . Palliative Care was asked to help address goals of care. At present, Loretta Wade is lying in bed, appears comfortable, chronically ill. No visitors present.   CODE STATUS: FULL CODE  PPS: 40% HOSPICE ELIGIBILITY/DIAGNOSIS: TBD  PAST MEDICAL HISTORY:  Past Medical History:  Diagnosis Date   Anemia    CHF (congestive heart failure) (HCC)    Chronic back pain    COPD (chronic obstructive pulmonary disease) (HCC)    on 2L home o2   Edema    Esophageal reflux    Fatigue    Fibromyalgia    Following with pain management   Hypertension    Nocturia    OA (osteoarthritis)    Sleep apnea    not on CPAP    SOCIAL HX:  Social History   Tobacco Use   Smoking status: Former Smoker    Last attempt to quit: 11/04/1983    Years since quitting: 34.5   Smokeless tobacco: Never Used   Tobacco comment: quit several years ago - almost 30 years ago  Substance Use Topics   Alcohol use: No    Alcohol/week: 0.0 standard drinks    ALLERGIES:  Allergies  Allergen Reactions   Gabapentin     Pt reports dizziness and abnormal urine labs.   Tramadol Nausea And Vomiting   Vioxx [Rofecoxib]     No reaction listed on MAR.     PERTINENT MEDICATIONS:  Outpatient Encounter Medications as of 05/15/2018    Medication Sig   acetaminophen (TYLENOL) 325 MG tablet Take 2 tablets (650 mg total) by mouth every 6 (six) hours as needed for mild pain (or Fever >/= 101).   albuterol (PROVENTIL) (2.5 MG/3ML) 0.083% nebulizer solution USE ONE TREATMENT EVERY 6 HOURS AS NEEDED FOR WHEEZING OR SHORTNESS OF BREATH   ANORO ELLIPTA 62.5-25 MCG/INH AEPB Inhale 1 puff into the lungs daily.   chlorpheniramine-HYDROcodone (TUSSIONEX) 10-8 MG/5ML SUER Take 5 mLs by mouth every 12 (twelve) hours as needed for cough.   cholecalciferol (VITAMIN D) 1000 UNITS tablet Take 1,000 Units by mouth daily.    citalopram (CELEXA) 20 MG tablet TAKE ONE (1) TABLET BY MOUTH EVERY DAY   colesevelam (WELCHOL) 625 MG tablet TAKE ONE TABLET TWICE A DAY WITH FOOD   cyanocobalamin 500 MCG tablet Take 500 mcg by mouth daily.   dexlansoprazole (DEXILANT) 60 MG capsule TAKE ONE (1) CAPSULE EACH DAY   diclofenac sodium (VOLTAREN) 1 % GEL Apply 4 g topically 4 (four) times daily.   fluticasone (FLONASE) 50 MCG/ACT nasal spray USE TWO SPRAYS IN EACH NOSTRIL EVERY DAY   furosemide (LASIX) 40 MG tablet Take 1 tablet (40 mg total) by mouth 2 (two) times daily.   guaiFENesin (MUCINEX) 600 MG 12 hr tablet Take 1 tablet (600 mg total) by mouth  2 (two) times daily.   HYDROcodone-acetaminophen (NORCO/VICODIN) 5-325 MG tablet Take 1 tablet by mouth every 6 (six) hours as needed for moderate pain.   HYDROcodone-homatropine (HYCODAN) 5-1.5 MG/5ML syrup hydrocodone-homatropine 5 mg-1.5 mg/5 mL oral syrup  Take 5 mL every 6 hours by oral route as needed.   hydrocortisone cream 1 % Apply 1 application topically 2 (two) times daily. Apply to rash on upper back.   ipratropium-albuterol (DUONEB) 0.5-2.5 (3) MG/3ML SOLN Take 3 mLs by nebulization every 6 (six) hours as needed.   loperamide (IMODIUM) 2 MG capsule Take 4 mg by mouth daily as needed for diarrhea or loose stools.    LORazepam (ATIVAN) 0.5 MG tablet    Magnesium 250 MG TABS Take  250 mg by mouth daily.   nystatin (MYCOSTATIN) 100000 UNIT/ML suspension Take 5 mLs (500,000 Units total) by mouth 4 (four) times daily.   omeprazole (PRILOSEC) 20 MG capsule Take 20 mg by mouth daily.   oxybutynin (DITROPAN) 5 MG tablet TAKE ONE (1) TABLET BY MOUTH EVERY DAY   oxybutynin (DITROPAN-XL) 10 MG 24 hr tablet Take 10 mg by mouth daily.   polyethylene glycol (MIRALAX / GLYCOLAX) packet Take 17 g daily by mouth.   predniSONE (DELTASONE) 20 MG tablet Take 2 tablets (40 mg total) by mouth daily.   pregabalin (LYRICA) 150 MG capsule Take 1 capsule (150 mg total) by mouth 2 (two) times daily.   Probiotic Product (ALIGN) 4 MG CAPS Take 4 mg by mouth daily.   rOPINIRole (REQUIP) 0.25 MG tablet Take 3 tablets (0.75 mg total) by mouth at bedtime.   No facility-administered encounter medications on file as of 05/15/2018.     PHYSICAL EXAM:   General: obese, chronically ill female Cardiovascular: regular rate and rhythm Pulmonary: clear ant fields Abdomen: Obese, nontender, + bowel sounds GU: no suprapubic tenderness Extremities: +BLE edema, no joint deformities Skin: no rashes Neurological: Weakness but otherwise nonfocal/non-ambulatory  Cambria Osten Prince [place], NP

## 2018-07-17 ENCOUNTER — Encounter: Payer: Self-pay | Admitting: Nurse Practitioner

## 2018-07-17 ENCOUNTER — Non-Acute Institutional Stay: Payer: Medicare Other | Admitting: Nurse Practitioner

## 2018-07-17 VITALS — BP 126/78 | HR 80 | Temp 98.0°F | Resp 18 | Ht 65.0 in | Wt 284.0 lb

## 2018-07-17 DIAGNOSIS — Z515 Encounter for palliative care: Secondary | ICD-10-CM

## 2018-07-17 DIAGNOSIS — R0602 Shortness of breath: Secondary | ICD-10-CM

## 2018-07-17 DIAGNOSIS — G894 Chronic pain syndrome: Secondary | ICD-10-CM

## 2018-07-17 DIAGNOSIS — R531 Weakness: Secondary | ICD-10-CM

## 2018-07-17 NOTE — Progress Notes (Signed)
Therapist, nutritional Palliative Care Consult Note Telephone: [phone]  Fax: [phone]  PATIENT NAME: Loretta Wade DOB: Mar 23, 1950 MRN: [id number]  PRIMARY CARE PROVIDER:   Dr Kathleen Lime PROVIDER:  Dr Hodges/[facility] RESPONSIBLE PARTY:   Self/Brandon Dileonardo son [phone]  RECOMMENDATIONS and PLAN:  1. Palliative care encounter Z51.5; Ongoing monitoring chronic disease management, symptoms, goc with emotional support.  2.Generalized weaknessR53.1  secondary to obesity, CHF continue with therapy as able. Encourage energy conservation and rest times.  3. Dyspneic R06.00 secondary to congestive heart failure remain stable at present time. Continue daily weights   4. Chronic pain G89.29 monitor on pain scale, monitor efficacy vs adverse side effects. Currently taking   ASSESSMENT:     I visited and observed Loretta Wade. We talked about purpose and palliative care visit and she gave verbal agreement. We talked about how she was feeling today. She ruminated overstaffed talking about her. We talked about her delusions. We talked about cooking strategies. Reassured her that staff was there to help her. We talked about chronic disease processes. She talked about her abdomen feeling strange. We talked about symptoms of pain but she currently denies. We talked about shortness of breath what she currently denies. She continued to ruminate about staff. Attempted to reassure Loretta Wade and emotional support. We talked about role of palliative care and plan of care. She is having great difficulty with judgement, decision making in her ability to process information it appears. She is very focused on Scientist, research (life sciences) and people talking about her. Psychiatry started Buspar for increase in anxiety and symptoms as Loretta. Wade declined to take any other medications. Continue to offer Psychotherapy and palliative care support. Medical goals  continue to focus on aggressive interventions. I have attempted to contact her son for update on palliative care visit in further discussions of goals of care including code status. She previously was a DNR though after last hospitalization wishes to be a full code. I updated nursing staff many changes at present time to goals or plan of care. Will continue to follow monitor with palliative care with next visit in 4 weeks if needed or sooner should she declined.  3 / 17 / 2020 WBC 5.6, hemoglobin 11.3, hematocrit 34.6, platelets 186, sodium 145, potassium 3.5, chloride 100, co2 38, calcium 9.1, bun 10.9, creatinine 0.46, glucose 124, total protein 6.6, albumin 3.3, alt 40, AST 88, alkaline phosphatase 157, B12 1000, direct bilirubin 0.430, folic acid 7.2, vitamin D 29.20  4 / 27 / 2020 ammonia 94  I spent 45 minutes providing this consultation,  From 1:30pm to 2:15pm. More than 50% of the time in this consultation was spent coordinating communication.   HISTORY OF PRESENT ILLNESS:  PALLAS SCHWENDEMANN is a 68 y.o. year old female with multiple medical problems including congestive heart failure, morbid obesity, for full vascular disease, anemia, chronic back pain, COPD, on oxygen continuous, fibromyalgia, gerd, hypertension, osteoarthritis, obstructive sleep apnea, hyperlipidemia, opiate dependent, adjustment disorder, major depression with hallucinations, vitamin D deficiency. Loretta Wade continues to reside at skilled [facility] at [facility]. She is bed bound secondary to obesity and refusal to get out of bed. She would require a Nurse, adult. She is ADL dependent. She requires staff to turn her for positioning. She is episodes of incontinence. She is able to feed herself and appetite has been Fair. No recent hospitalizations, wounds, Falls. Staff endorses she has been having more  difficulty with paranoia and thinking people are talking about her. She is Halo by  Psychiatry and Psychotherapy to help manage delusions. She does take lactulose for increasing ammonia. She currently is on Oxycodone 5 mg 6 hours as needed for chronic pain and 0 / 24 hours. She does remaining full code. At present she is lying in bed, morbidly obese appears chronically ill 02 dependent. No distress noted. No visitors present. Palliative Care was asked to help to continue to address goals of care.   CODE STATUS: Full code  PPS: 30% HOSPICE ELIGIBILITY/DIAGNOSIS: TBD  PAST MEDICAL HISTORY:  Past Medical History:  Diagnosis Date  . Anemia   . CHF (congestive heart failure) (HCC)   . Chronic back pain   . COPD (chronic obstructive pulmonary disease) (HCC)    on 2L home o2  . Edema   . Esophageal reflux   . Fatigue   . Fibromyalgia    Following with pain management  . Hypertension   . Nocturia   . OA (osteoarthritis)   . Sleep apnea    not on CPAP    SOCIAL HX:  Social History   Tobacco Use  . Smoking status: Former Smoker    Last attempt to quit: 11/04/1983    Years since quitting: 34.7  . Smokeless tobacco: Never Used  . Tobacco comment: quit several years ago - almost 30 years ago  Substance Use Topics  . Alcohol use: No    Alcohol/week: 0.0 standard drinks    ALLERGIES:  Allergies  Allergen Reactions  . Gabapentin     Pt reports dizziness and abnormal urine labs.  . Tramadol Nausea And Vomiting  . Vioxx [Rofecoxib]     No reaction listed on MAR.     PERTINENT MEDICATIONS:  Outpatient Encounter Medications as of 07/17/2018  Medication Sig  . albuterol (PROVENTIL) (2.5 MG/3ML) 0.083% nebulizer solution USE ONE TREATMENT EVERY 6 HOURS AS NEEDED FOR WHEEZING OR SHORTNESS OF BREATH  . ANORO ELLIPTA 62.5-25 MCG/INH AEPB Inhale 1 puff into the lungs daily.  . busPIRone (BUSPAR) 15 MG tablet Take 15 mg by mouth. 1/2 tablet daily  . cholecalciferol (VITAMIN D) 1000 UNITS tablet Take 1,000 Units by mouth daily.   . Cranberry 475 MG CAPS Take by mouth. 1 po  daily  . diclofenac sodium (VOLTAREN) 1 % GEL Apply 4 g topically 4 (four) times daily.  . fluticasone (FLONASE) 50 MCG/ACT nasal spray USE TWO SPRAYS IN EACH NOSTRIL EVERY DAY  . furosemide (LASIX) 40 MG tablet Take 1 tablet (40 mg total) by mouth 2 (two) times daily.  . hydroxypropyl methylcellulose / hypromellose (ISOPTO TEARS / GONIOVISC) 2.5 % ophthalmic solution Place 1 drop into both eyes.  Marland Kitchen lactulose (CHRONULAC) 10 GM/15ML solution Take by mouth 3 (three) times daily. 45gm po tid  . Lidocaine (HM LIDOCAINE PATCH) 4 % PTCH Apply topically. Apply once a day  . loratadine (CLARITIN) 10 MG tablet Take 10 mg by mouth daily.  . magnesium oxide (MAG-OX) 400 MG tablet Take 400 mg by mouth daily.  Marland Kitchen omeprazole (PRILOSEC) 20 MG capsule Take 20 mg by mouth daily.  Marland Kitchen oxybutynin (DITROPAN) 5 MG tablet TAKE ONE (1) TABLET BY MOUTH EVERY DAY  . oxycodone (OXY-IR) 5 MG capsule Take 5 mg by mouth. 1 po q8hrs as needed for pain  . polyethylene glycol (MIRALAX / GLYCOLAX) 17 g packet Take 17 g by mouth daily.  . pregabalin (LYRICA) 200 MG capsule Take 200 mg by mouth 2 (  two) times daily.  . Probiotic Product (ALIGN) 4 MG CAPS Take 4 mg by mouth daily.  Marland Kitchen rOPINIRole (REQUIP) 0.25 MG tablet Take 0.25 mg by mouth at bedtime. 3 tablets qhs  . nystatin (MYCOSTATIN/NYSTOP) powder nystatin 100,000 unit/gram topical powder  . PERFOROMIST 20 MCG/2ML nebulizer solution   . [DISCONTINUED] acetaminophen (TYLENOL) 325 MG tablet Take 2 tablets (650 mg total) by mouth every 6 (six) hours as needed for mild pain (or Fever >/= 101). (Patient not taking: Reported on 07/17/2018)  . [DISCONTINUED] chlorpheniramine-HYDROcodone (TUSSIONEX) 10-8 MG/5ML SUER Take 5 mLs by mouth every 12 (twelve) hours as needed for cough. (Patient not taking: Reported on 07/17/2018)  . [DISCONTINUED] citalopram (CELEXA) 20 MG tablet TAKE ONE (1) TABLET BY MOUTH EVERY DAY (Patient not taking: Reported on 07/17/2018)  . [DISCONTINUED] colesevelam  (WELCHOL) 625 MG tablet TAKE ONE TABLET TWICE A DAY WITH FOOD (Patient not taking: Reported on 07/17/2018)  . [DISCONTINUED] cyanocobalamin 500 MCG tablet Take 500 mcg by mouth daily.  . [DISCONTINUED] dexlansoprazole (DEXILANT) 60 MG capsule TAKE ONE (1) CAPSULE EACH DAY (Patient not taking: Reported on 07/17/2018)  . [DISCONTINUED] guaiFENesin (MUCINEX) 600 MG 12 hr tablet Take 1 tablet (600 mg total) by mouth 2 (two) times daily. (Patient not taking: Reported on 07/17/2018)  . [DISCONTINUED] HYDROcodone-acetaminophen (NORCO/VICODIN) 5-325 MG tablet Take 1 tablet by mouth every 6 (six) hours as needed for moderate pain.  . [DISCONTINUED] HYDROcodone-homatropine (HYCODAN) 5-1.5 MG/5ML syrup hydrocodone-homatropine 5 mg-1.5 mg/5 mL oral syrup  Take 5 mL every 6 hours by oral route as needed.  . [DISCONTINUED] hydrocortisone cream 1 % Apply 1 application topically 2 (two) times daily. Apply to rash on upper back.  . [DISCONTINUED] ipratropium-albuterol (DUONEB) 0.5-2.5 (3) MG/3ML SOLN Take 3 mLs by nebulization every 6 (six) hours as needed. (Patient not taking: Reported on 07/17/2018)  . [DISCONTINUED] loperamide (IMODIUM) 2 MG capsule Take 4 mg by mouth daily as needed for diarrhea or loose stools.   . [DISCONTINUED] LORazepam (ATIVAN) 0.5 MG tablet   . [DISCONTINUED] Magnesium 250 MG TABS Take 250 mg by mouth daily.  . [DISCONTINUED] nystatin (MYCOSTATIN) 100000 UNIT/ML suspension Take 5 mLs (500,000 Units total) by mouth 4 (four) times daily. (Patient not taking: Reported on 07/17/2018)  . [DISCONTINUED] oxybutynin (DITROPAN-XL) 10 MG 24 hr tablet Take 10 mg by mouth daily.  . [DISCONTINUED] polyethylene glycol (MIRALAX / GLYCOLAX) packet Take 17 g daily by mouth. (Patient not taking: Reported on 07/17/2018)  . [DISCONTINUED] predniSONE (DELTASONE) 20 MG tablet Take 2 tablets (40 mg total) by mouth daily. (Patient not taking: Reported on 07/17/2018)  . [DISCONTINUED] pregabalin (LYRICA) 150 MG capsule  Take 1 capsule (150 mg total) by mouth 2 (two) times daily. (Patient not taking: Reported on 07/17/2018)  . [DISCONTINUED] rOPINIRole (REQUIP) 0.25 MG tablet Take 3 tablets (0.75 mg total) by mouth at bedtime. (Patient not taking: Reported on 07/17/2018)   No facility-administered encounter medications on file as of 07/17/2018.     PHYSICAL EXAM:   General: obese, chronically ill, debilitated female Cardiovascular: regular rate and rhythm Pulmonary: clear ant fields/poor air movement Abdomen: soft, nontender, + bowel sounds GU: no suprapubic tenderness Extremities: +generalized edema, no joint deformities Skin: no rashes Neurological: Weakness but otherwise nonfocal/functional quadriplegic  Juanitta Earnhardt [place], NP

## 2018-07-18 ENCOUNTER — Emergency Department: Payer: Medicare Other

## 2018-07-18 ENCOUNTER — Other Ambulatory Visit: Payer: Self-pay

## 2018-07-18 ENCOUNTER — Emergency Department
Admission: EM | Admit: 2018-07-18 | Discharge: 2018-07-19 | Disposition: A | Discharge status: Home / Self Care | Payer: Medicare Other | Attending: Internal Medicine | Admitting: Internal Medicine

## 2018-07-18 DIAGNOSIS — Z1159 Encounter for screening for other viral diseases: Secondary | ICD-10-CM | POA: Diagnosis not present

## 2018-07-18 DIAGNOSIS — D649 Anemia, unspecified: Secondary | ICD-10-CM | POA: Insufficient documentation

## 2018-07-18 DIAGNOSIS — R4182 Altered mental status, unspecified: Secondary | ICD-10-CM | POA: Insufficient documentation

## 2018-07-18 DIAGNOSIS — F22 Delusional disorders: Secondary | ICD-10-CM

## 2018-07-18 DIAGNOSIS — I10 Essential (primary) hypertension: Secondary | ICD-10-CM | POA: Diagnosis not present

## 2018-07-18 DIAGNOSIS — Z87891 Personal history of nicotine dependence: Secondary | ICD-10-CM | POA: Insufficient documentation

## 2018-07-18 DIAGNOSIS — I509 Heart failure, unspecified: Secondary | ICD-10-CM | POA: Insufficient documentation

## 2018-07-18 DIAGNOSIS — R443 Hallucinations, unspecified: Secondary | ICD-10-CM | POA: Insufficient documentation

## 2018-07-18 DIAGNOSIS — F4320 Adjustment disorder, unspecified: Secondary | ICD-10-CM | POA: Diagnosis not present

## 2018-07-18 DIAGNOSIS — F331 Major depressive disorder, recurrent, moderate: Secondary | ICD-10-CM | POA: Insufficient documentation

## 2018-07-18 DIAGNOSIS — J449 Chronic obstructive pulmonary disease, unspecified: Secondary | ICD-10-CM | POA: Diagnosis not present

## 2018-07-18 LAB — CBC WITH DIFFERENTIAL/PLATELET
Abs Immature Granulocytes: 0.03 10*3/uL (ref 0.00–0.07)
Basophils Absolute: 0.1 10*3/uL (ref 0.0–0.1)
Basophils Relative: 1 %
Eosinophils Absolute: 0.2 10*3/uL (ref 0.0–0.5)
Eosinophils Relative: 3 %
HCT: 42 % (ref 36.0–46.0)
Hemoglobin: 13.1 g/dL (ref 12.0–15.0)
Immature Granulocytes: 0 %
Lymphocytes Relative: 32 %
Lymphs Abs: 2.2 10*3/uL (ref 0.7–4.0)
MCH: 30.4 pg (ref 26.0–34.0)
MCHC: 31.2 g/dL (ref 30.0–36.0)
MCV: 97.4 fL (ref 80.0–100.0)
Monocytes Absolute: 0.8 10*3/uL (ref 0.1–1.0)
Monocytes Relative: 12 %
Neutro Abs: 3.5 10*3/uL (ref 1.7–7.7)
Neutrophils Relative %: 52 %
Platelets: 228 10*3/uL (ref 150–400)
RBC: 4.31 MIL/uL (ref 3.87–5.11)
RDW: 14.6 % (ref 11.5–15.5)
WBC: 6.8 10*3/uL (ref 4.0–10.5)
nRBC: 0 % (ref 0.0–0.2)

## 2018-07-18 LAB — URINALYSIS, COMPLETE (UACMP) WITH MICROSCOPIC
Bacteria, UA: NONE SEEN
Bilirubin Urine: NEGATIVE
Glucose, UA: NEGATIVE mg/dL
Hgb urine dipstick: NEGATIVE
Ketones, ur: NEGATIVE mg/dL
Leukocytes,Ua: NEGATIVE
Nitrite: NEGATIVE
Protein, ur: 30 mg/dL — AB
Specific Gravity, Urine: 1.02 (ref 1.005–1.030)
pH: 5 (ref 5.0–8.0)

## 2018-07-18 LAB — COMPREHENSIVE METABOLIC PANEL
ALT: 38 U/L (ref 0–44)
AST: 78 U/L — ABNORMAL HIGH (ref 15–41)
Albumin: 3.4 g/dL — ABNORMAL LOW (ref 3.5–5.0)
Alkaline Phosphatase: 176 U/L — ABNORMAL HIGH (ref 38–126)
Anion gap: 10 (ref 5–15)
BUN: 10 mg/dL (ref 8–23)
CO2: 32 mmol/L (ref 22–32)
Calcium: 9.3 mg/dL (ref 8.9–10.3)
Chloride: 99 mmol/L (ref 98–111)
Creatinine, Ser: 0.7 mg/dL (ref 0.44–1.00)
GFR calc Af Amer: >60 mL/min (ref >60)
GFR calc non Af Amer: >60 mL/min (ref >60)
Glucose, Bld: 128 mg/dL — ABNORMAL HIGH (ref 70–99)
Potassium: 3.3 mmol/L — ABNORMAL LOW (ref 3.5–5.1)
Sodium: 141 mmol/L (ref 135–145)
Total Bilirubin: 1 mg/dL (ref 0.3–1.2)
Total Protein: 7.8 g/dL (ref 6.5–8.1)

## 2018-07-18 LAB — URINE DRUG SCREEN, QUALITATIVE (ARMC ONLY)
Amphetamines, Ur Screen: NOT DETECTED
Barbiturates, Ur Screen: NOT DETECTED
Benzodiazepine, Ur Scrn: NOT DETECTED
Cannabinoid 50 Ng, Ur ~~LOC~~: NOT DETECTED
Cocaine Metabolite,Ur ~~LOC~~: NOT DETECTED
MDMA (Ecstasy)Ur Screen: NOT DETECTED
Methadone Scn, Ur: NOT DETECTED
Opiate, Ur Screen: NOT DETECTED
Phencyclidine (PCP) Ur S: NOT DETECTED
Tricyclic, Ur Screen: NOT DETECTED

## 2018-07-18 LAB — AMMONIA: Ammonia: 41 umol/L — ABNORMAL HIGH (ref 9–35)

## 2018-07-18 LAB — TROPONIN I: Troponin I: <0.03 ng/mL (ref <0.03)

## 2018-07-18 LAB — SARS CORONAVIRUS 2 BY RT PCR (HOSPITAL ORDER, PERFORMED IN ~~LOC~~ HOSPITAL LAB): SARS Coronavirus 2: NEGATIVE

## 2018-07-18 LAB — ETHANOL: Alcohol, Ethyl (B): <10 mg/dL (ref <10)

## 2018-07-18 MED ORDER — LACTULOSE 10 GM/15ML PO SOLN
30.0000 g | Freq: Once | ORAL | Status: AC
  Administered 2018-07-18: 30 g via ORAL
  Filled 2018-07-18: qty 60

## 2018-07-18 MED ORDER — OXYCODONE-ACETAMINOPHEN 5-325 MG PO TABS: 2.0000 | ORAL_TABLET | Freq: Once | ORAL | Status: DC

## 2018-07-18 MED ORDER — QUETIAPINE FUMARATE ER 50 MG PO TB24
50.0000 mg | ORAL_TABLET | Freq: Every day | ORAL | Status: DC
  Administered 2018-07-18: 23:00:00 50 mg via ORAL
  Filled 2018-07-18 (×2): qty 1

## 2018-07-18 MED ORDER — NYSTATIN 100000 UNIT/GM EX POWD
Freq: Two times a day (BID) | CUTANEOUS | Status: DC
  Administered 2018-07-19: 08:00:00 via TOPICAL
  Filled 2018-07-18: qty 15

## 2018-07-18 MED ORDER — OXYCODONE HCL 5 MG PO TABS
5.0000 mg | ORAL_TABLET | Freq: Once | ORAL | Status: AC
  Administered 2018-07-18: 5 mg via ORAL
  Filled 2018-07-18: qty 1

## 2018-07-18 NOTE — ED Triage Notes (Signed)
She arrives today via [facility] from [facility] under IVC  EMS reports that the staff stated the pt has been paranoid  Pt believes that someone is trying to harm her and her family   Pt reports that [facility] threw her out today and she has been living there since 2015    EMS reports that the staff at [facility] had her outside on a stretcher when they arrived  - they moved her from one stretcher to another and then as they were moving towards the truck the stretcher flipped and resulted in her falling off the stretcher  P[t verbalizing right sided pain

## 2018-07-18 NOTE — ED Notes (Signed)
Patient is back in room from X-ray, will continue to monitor.

## 2018-07-18 NOTE — BH Assessment (Signed)
Patient is not yet medically clear; TTS consult is delayed.

## 2018-07-18 NOTE — ED Notes (Signed)
Put yellow socks and a fall risk bracelet on pt. Pt was given 2 warm blankets.

## 2018-07-18 NOTE — Clinical Social Work Note (Signed)
Patient reported to [facility] that patient was thrown out of [facility] where she has been living since 2015.  CSW contacted [facility] and they said they did not throw her out and she can return once she is medically ready to return.  CSW reviewed documentation that [facility] had patient on a stretcher outside, and patient fell.  CSW spoke to Merchandiser, retail of nursing (DON) at [facility], she witnessed that patient had 2 EMS workers, 2 police officers, and 3 staff who were trying to help patient from patient's personal genron broda chair to the stretcher provided by EMS.  DON stated that EMS only had on strap on patient while they were getting her into the ambulance and patient fell.  DON documented that patient received two abrassions on her right elbow from the fall.  DON made a statement to the police and also left a message with supervisor of EMS.  DON stated if any more information is needed she can be contacted.  Jones Broom. Norval Morton, MSW, [place]  07/18/2018 4:35 PM
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
## 2018-07-18 NOTE — ED Notes (Signed)
Patient brief dry and intact per Nurse Tech. Patient did complain regarding yeast infection, ointment ( white substance is noted already on groin that was on her on initial assessment" ) Nurse talked with Doctor and received order for Nystatin powder, Patient had pain medication and she did go back to sleep easily.
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
## 2018-07-18 NOTE — ED Notes (Signed)
Director of [place] called and states that she has been hallucinating thinking that Loretta Wade is going to kill her family and they are trying to steal her stimulus check, Director states that she hears things on tv and thinks that it is about her family, and she refused trying seroquel medication, also states that her ammonia level has been elevated lately. Nurse will pass information along to Doctor.

## 2018-07-18 NOTE — ED Notes (Signed)
Patient transferred via bed to x-ray with assist, and escort of nurse aid and police. Patient is IVC'd.

## 2018-07-19 DIAGNOSIS — F22 Delusional disorders: Secondary | ICD-10-CM | POA: Insufficient documentation

## 2018-07-19 DIAGNOSIS — R443 Hallucinations, unspecified: Secondary | ICD-10-CM | POA: Diagnosis not present

## 2018-07-19 MED ORDER — QUETIAPINE FUMARATE ER 50 MG PO TB24: 50.0000 mg | ORAL_TABLET | Freq: Every day | ORAL | 0 refills | Status: DC

## 2018-07-19 MED ORDER — QUETIAPINE FUMARATE ER 50 MG PO TB24: 50.0000 mg | ORAL_TABLET | Freq: Every day | ORAL | 1 refills | Status: AC

## 2018-07-19 MED ORDER — RISPERIDONE 1 MG PO TABS
0.5000 mg | ORAL_TABLET | Freq: Two times a day (BID) | ORAL | Status: DC
  Administered 2018-07-19: 0.5 mg via ORAL
  Filled 2018-07-19: qty 1

## 2018-07-19 NOTE — NC FL2 (Signed)
[place] MEDICAID FL2 LEVEL OF CARE SCREENING TOOL     IDENTIFICATION  Patient Name: Loretta Wade Birthdate: [birth year] Sex: female Admission Date (Current Location): 07/18/2018  [place] and [place] Number:  [address] [id number] T Facility and Address:  [facility], [address], [place], [place] [address]      Provider Number: [id number]  Attending Physician Name and Address:    Relative Name and Phone Number:  Arvilla Market [phone] or Remii, Adriano [phone] or Miles,Kim Niece [phone] or Miles,Sherri Niece [phone]     Current Level of Care: Hospital Recommended Level of Care: Skilled Nursing Facility Prior Approval Number:    Date Approved/Denied:   PASRR Number: [id number]  Discharge Plan: SNF    Current Diagnoses: Patient Active Problem List   Diagnosis Date Noted  . Delusional disorder, persecutory type, continuous (HCC) 07/19/2018  . Paranoia (HCC)   . Altered mental status, unspecified 07/18/2018  . Weakness generalized 02/08/2018  . Chronic hypoxemic respiratory failure (HCC) 08/07/2017  . Morbid obesity (HCC) 08/07/2017  . Obstructive sleep apnea syndrome 08/07/2017  . Restless legs 08/07/2017  . CHF (congestive heart failure) (HCC) 01/18/2017  . Candidal intertrigo 02/08/2016  . Dysuria 02/08/2016  . Requires assistance with activities of daily living (ADL) 02/08/2016  . Oral thrush 11/10/2015  . Tachypnea 11/01/2015  . Acute non-recurrent maxillary sinusitis 10/29/2015  . Shortness of breath 10/19/2015  . Fall 07/11/2015  . Dyslipidemia 06/17/2015  . Overflow stress incontinence of urine in female 06/17/2015  . Chronic pain 06/10/2015  . Chronic low back pain (Location of Tertiary source of pain) (Bilateral) (R>L) 06/10/2015  . Chronic shoulder pain (Location of Secondary source of pain) (Left) 06/10/2015  . Costochondritis (Location of Primary Source of Pain) 06/10/2015  . Long term  current use of opiate analgesic 06/10/2015  . Long term prescription opiate use 06/10/2015  . Opiate use 06/10/2015  . Encounter for therapeutic drug level monitoring 06/10/2015  . Palliative care encounter 06/10/2015  . Chronic feet pain (Bilateral) (R>L) 06/10/2015  . Osteoarthritis 06/10/2015  . Osteoarthritis of shoulder (Left) 06/10/2015  . COPD exacerbation (HCC) 04/12/2015  . Poor mobility 04/12/2015  . Fibromyalgia 03/23/2015  . GERD (gastroesophageal reflux disease) 03/23/2015  . MRSA infection 11/17/2014  . Chronic diastolic CHF (congestive heart failure) (HCC) 11/12/2014  . Morbid obesity with BMI of 50.0-59.9, adult (HCC) 11/12/2014  . Neck pain, chronic 11/12/2014  . COPD (chronic obstructive pulmonary disease) (HCC) 11/04/2014  . Urinary tract infection 10/02/2014    Orientation RESPIRATION BLADDER Height & Weight     Self, Situation, Place, Time  Normal Incontinent Weight: 284 lb 6.3 oz (129 kg) Height:  5\' 5"  (165.1 cm)  BEHAVIORAL SYMPTOMS/MOOD NEUROLOGICAL BOWEL NUTRITION STATUS      Continent Diet(Low sodium heart healthy)  AMBULATORY STATUS COMMUNICATION OF NEEDS Skin   Extensive Assist   Skin abrasions                       Personal Care Assistance Level of Assistance  Bathing, Feeding, Dressing Bathing Assistance: Maximum assistance Feeding assistance: Independent Dressing Assistance: Maximum assistance     Functional Limitations Info  Sight, Hearing, Speech Sight Info: Adequate Hearing Info: Adequate Speech Info: Adequate    SPECIAL CARE FACTORS FREQUENCY                       Contractures Contractures Info: Not present    Additional Factors Info  Code Status, Allergies,  Psychotropic Code Status Info: Full Code Allergies Info: GABAPENTIN, TRAMADOL, VIOXX ROFECOXIB  Psychotropic Info: QUEtiapine (SEROQUEL XR) 24 hr tablet 50 mg and risperiDONE (RISPERDAL) tablet 0.5 mg          Current Medications (07/19/2018):  This is the  current hospital active medication list Current Facility-Administered Medications  Medication Dose Route Frequency Provider Last Rate Last Dose  . nystatin (MYCOSTATIN/NYSTOP) topical powder   Topical BID Emily Filbert, MD      . QUEtiapine (SEROQUEL XR) 24 hr tablet 50 mg  50 mg Oral QHS Catalina Gravel, NP   50 mg at 07/18/18 2323  . risperiDONE (RISPERDAL) tablet 0.5 mg  0.5 mg Oral BID Charm Rings, NP   0.5 mg at 07/19/18 0981   Current Outpatient Medications  Medication Sig Dispense Refill  . albuterol (PROVENTIL) (2.5 MG/3ML) 0.083% nebulizer solution USE ONE TREATMENT EVERY 6 HOURS AS NEEDED FOR WHEEZING OR SHORTNESS OF BREATH 360 mL 0  . ANORO ELLIPTA 62.5-25 MCG/INH AEPB Inhale 1 puff into the lungs daily.    . busPIRone (BUSPAR) 15 MG tablet Take 7.5 mg by mouth daily. 1/2 tablet daily     . cholecalciferol (VITAMIN D) 1000 UNITS tablet Take 1,000 Units by mouth daily.     . Cranberry 475 MG CAPS Take by mouth. 1 po daily    . diclofenac sodium (VOLTAREN) 1 % GEL Apply 4 g topically 4 (four) times daily.    . fluticasone (FLONASE) 50 MCG/ACT nasal spray USE TWO SPRAYS IN EACH NOSTRIL EVERY DAY 16 g 0  . furosemide (LASIX) 40 MG tablet Take 1 tablet (40 mg total) by mouth 2 (two) times daily. 30 tablet 60  . hydroxypropyl methylcellulose / hypromellose (ISOPTO TEARS / GONIOVISC) 2.5 % ophthalmic solution Place 1 drop into both eyes.    Marland Kitchen lactulose (CHRONULAC) 10 GM/15ML solution Take by mouth 3 (three) times daily. 45gm po tid    . Lidocaine (HM LIDOCAINE PATCH) 4 % PTCH Apply topically. Apply once a day    . loratadine (CLARITIN) 10 MG tablet Take 10 mg by mouth daily.    . magnesium oxide (MAG-OX) 400 MG tablet Take 400 mg by mouth daily.    Marland Kitchen nystatin (MYCOSTATIN/NYSTOP) powder nystatin 100,000 unit/gram topical powder    . omeprazole (PRILOSEC) 20 MG capsule Take 20 mg by mouth daily.    Marland Kitchen oxybutynin (DITROPAN-XL) 5 MG 24 hr tablet Take 5 mg by mouth daily.    Marland Kitchen  oxycodone (OXY-IR) 5 MG capsule Take 5 mg by mouth. 1 po q8hrs as needed for pain    . PERFOROMIST 20 MCG/2ML nebulizer solution     . polyethylene glycol (MIRALAX / GLYCOLAX) 17 g packet Take 17 g by mouth daily.    . pregabalin (LYRICA) 200 MG capsule Take 200 mg by mouth 2 (two) times daily.    . Probiotic Product (ALIGN) 4 MG CAPS Take 4 mg by mouth daily.    Marland Kitchen rOPINIRole (REQUIP) 0.25 MG tablet Take 0.25 mg by mouth at bedtime. 3 tablets qhs    . azelastine (ASTELIN) 0.1 % nasal spray Place 1 spray into both nostrils daily.    . QUEtiapine (SEROQUEL XR) 50 MG TB24 24 hr tablet Take 1 tablet (50 mg total) by mouth at bedtime. 30 each 1     Discharge Medications: Please see discharge summary for a list of discharge medications.  Relevant Imaging Results:  Relevant Lab Results:   Additional Information SSN [id number]  Tierre Netto,  Ervin Knack, LCSW

## 2018-07-19 NOTE — ED Notes (Signed)
Pt's depend checked by this RN and nurse tech. Pt is clean and dry.

## 2018-07-19 NOTE — ED Notes (Signed)
Patient is becoming increasingly agiated about going back to [facility], patient is saying they are crooked people there and Ronny Bacon (transfer scheduler) is crooked. NP Lord notified.

## 2018-07-19 NOTE — ED Notes (Addendum)
Report called to [facility], RN at [facility].

## 2018-07-19 NOTE — TOC Transition Note (Signed)
Transition of Care [facility]) - CM/SW Discharge Note   Patient Details  Name: Loretta Wade MRN: [id number] Date of Birth: [birth year]  [facility] of [facility]) CM/SW Contact:  Ross Ludwig, LCSW Phone Number: 07/19/2018, 11:27 AM   Clinical Narrative:     Patient to be d/c'ed today to [facility] room 76B.  Patient and family agreeable to plans will transport via ems RN to call report [phone].  Final next level of care: Skilled Nursing Facility Barriers to Discharge: No Barriers Identified   Patient Goals and CMS Choice Patient states their goals for this hospitalization and ongoing recovery are:: To return back to SNF. CMS Medicare.gov Compare Post Acute Care list provided to:: Patient Choice offered to / list presented to : Patient  Discharge Placement    Patient will be discharging back to [facility] SNF.        Discharge Plan and Services In-house Referral: Clinical Social Work Discharge Planning Services: NA Post Acute Care Choice: [facility]          DME Arranged: N/A DME Agency: NA                  Social Determinants of Health (SDOH) Interventions     Readmission Risk Interventions No flowsheet data found.

## 2018-07-19 NOTE — ED Notes (Signed)
Patient says the [facility] staff want her money and she dont have any

## 2018-07-19 NOTE — ED Notes (Signed)
Hourly rounding reveals patient sleeping in room. No complaints, stable, in no acute distress. Q15 minute rounds and monitoring via Security to continue. 

## 2018-07-19 NOTE — ED Notes (Addendum)
Patient attempted to call sister [name] [phone] no answer

## 2018-07-19 NOTE — ED Provider Notes (Signed)
-----------------------------------------   11:50 AM on 07/19/2018 -----------------------------------------  The patient has been evaluated by the behavioral health team and cleared for discharge.  IVC has been rescinded by Dr. Weber Cooks.  The patient is stable for discharge at this time.  Return precautions provided.   Arta Silence, MD 07/19/18 1151

## 2018-07-19 NOTE — ED Notes (Signed)
Patient was told that she was going back to [facility] and patient said "oh im going back to [place]" Patient is crying out loud and tearful

## 2018-07-19 NOTE — ED Notes (Signed)
Spoke with Merry Lofty DON and she said the only way patient could come back is via EMS. She stated patient is welcomed to come back

## 2018-07-19 NOTE — ED Notes (Addendum)
Patient diaper changed by Probation officer and ED tech Felicia and nystatin applied to her genital area. NAD noted noted. Patient tolerated turning well.

## 2018-07-19 NOTE — Consult Note (Signed)
[facility] ED Discharge  07/19/2018 9:20 AM Loretta Wade  MRN:  [id number] Principal Problem: Delusional disorder, persecutory type, continuous [facility]) Discharge Diagnoses: Principal Problem:   Delusional disorder, persecutory type, continuous ([name])  Subjective: 68 yo female admitted from her SNF for paranoia and delusions of persecutions. She was started on Seroquel last night, compliant with her medications without any issues.  Today on assessment, she was calmly finishing her breakfast while watching television.  She denied having any issues except she still stated, "I don't want these people to hurt me or my family."  No distress and calmly stated this.  This provider assured her that no one was going to hurt her here and we were here to protect her.  Her nurse is at her doorway and her sitter who both report the patient being calm and cooperative with no issues voiced.  However, when the nurse mentioned her going back to her facility and she became upset.  Stating she does not like it there and does not want to return.  Her symptoms increase with the stress of returning to her facility and decrease when she is in the ED.  No suicidal/homicidal ideations, hallucinations, or substance abuse.  Not responding to internal stimuli.  Does not meet criteria for geriatric psychiatry at this time.  The nursing home was contacted per their request and report she is not compliant with medications there and Seroquel "won't work" because of this.  The patient has not displayed this behavior in the ED, Dr Dwyane Dee the psychiatric medical director is in agreement for the person to return as she is not meeting criteria for admission.  Total Time spent with patient: 30 minutes  Past Psychiatric History: none  Past Medical History:  Past Medical History:  Diagnosis Date  . Anemia   . CHF (congestive heart failure) ([place])   . Chronic back pain   . COPD (chronic obstructive pulmonary disease) ([facility])    on 2L home  o2  . Edema   . Esophageal reflux   . Fatigue   . Fibromyalgia    Following with pain management  . Hypertension   . Nocturia   . OA (osteoarthritis)   . Sleep apnea    not on CPAP    Past Surgical History:  Procedure Laterality Date  . ABDOMINAL HYSTERECTOMY    . CHOLECYSTECTOMY    . TOTAL KNEE ARTHROPLASTY     right  . VESICOVAGINAL FISTULA CLOSURE W/ TAH     Family History:  Family History  Problem Relation Age of Onset  . Hypertension Mother   . Diabetes Other   . Breast cancer Daughter    Family Psychiatric  History: none Social History:  Social History   Substance and Sexual Activity  Alcohol Use No  . Alcohol/week: 0.0 standard drinks     Social History   Substance and Sexual Activity  Drug Use No    Social History   Socioeconomic History  . Marital status: Single    Spouse name: Not on file  . Number of children: Not on file  . Years of education: Not on file  . Highest education level: Not on file  Occupational History  . Not on file  Social Needs  . Financial resource strain: Not on file  . Food insecurity:    Worry: Not on file    Inability: Not on file  . Transportation needs:    Medical: Not on file    Non-medical: Not on file  Tobacco Use  . Smoking status: Former Smoker    Last attempt to quit: 11/04/1983    Years since quitting: 34.7  . Smokeless tobacco: Never Used  . Tobacco comment: quit several years ago - almost 30 years ago  Substance and Sexual Activity  . Alcohol use: No    Alcohol/week: 0.0 standard drinks  . Drug use: No  . Sexual activity: Not on file  Lifestyle  . Physical activity:    Days per week: Not on file    Minutes per session: Not on file  . Stress: Not on file  Relationships  . Social connections:    Talks on phone: Not on file    Gets together: Not on file    Attends religious service: Not on file    Active member of club or organization: Not on file    Attends meetings of clubs or organizations: Not  on file    Relationship status: Not on file  Other Topics Concern  . Not on file  Social History Narrative   From [facility] assisted living.   Has a walker and wheelchair at baseline.    Has this patient used any form of tobacco in the last 30 days? (Cigarettes, Smokeless Tobacco, Cigars, and/or Pipes) NA  Current Medications: Current Facility-Administered Medications  Medication Dose Route Frequency Provider Last Rate Last Dose  . nystatin (MYCOSTATIN/NYSTOP) topical powder   Topical BID Earleen Newport, MD      . QUEtiapine (SEROQUEL XR) 24 hr tablet 50 mg  50 mg Oral QHS Lamont Dowdy, NP   50 mg at 07/18/18 2323  . risperiDONE (RISPERDAL) tablet 0.5 mg  0.5 mg Oral BID Patrecia Pour, NP       Current Outpatient Medications  Medication Sig Dispense Refill  . albuterol (PROVENTIL) (2.5 MG/3ML) 0.083% nebulizer solution USE ONE TREATMENT EVERY 6 HOURS AS NEEDED FOR WHEEZING OR SHORTNESS OF BREATH 360 mL 0  . ANORO ELLIPTA 62.5-25 MCG/INH AEPB Inhale 1 puff into the lungs daily.    . busPIRone (BUSPAR) 15 MG tablet Take 7.5 mg by mouth daily. 1/2 tablet daily     . cholecalciferol (VITAMIN D) 1000 UNITS tablet Take 1,000 Units by mouth daily.     . Cranberry 475 MG CAPS Take by mouth. 1 po daily    . diclofenac sodium (VOLTAREN) 1 % GEL Apply 4 g topically 4 (four) times daily.    . fluticasone (FLONASE) 50 MCG/ACT nasal spray USE TWO SPRAYS IN EACH NOSTRIL EVERY DAY 16 g 0  . furosemide (LASIX) 40 MG tablet Take 1 tablet (40 mg total) by mouth 2 (two) times daily. 30 tablet 60  . hydroxypropyl methylcellulose / hypromellose (ISOPTO TEARS / GONIOVISC) 2.5 % ophthalmic solution Place 1 drop into both eyes.    Marland Kitchen lactulose (CHRONULAC) 10 GM/15ML solution Take by mouth 3 (three) times daily. 45gm po tid    . Lidocaine (HM LIDOCAINE PATCH) 4 % PTCH Apply topically. Apply once a day    . loratadine (CLARITIN) 10 MG tablet Take 10 mg by mouth daily.    . magnesium oxide  (MAG-OX) 400 MG tablet Take 400 mg by mouth daily.    Marland Kitchen nystatin (MYCOSTATIN/NYSTOP) powder nystatin 100,000 unit/gram topical powder    . omeprazole (PRILOSEC) 20 MG capsule Take 20 mg by mouth daily.    Marland Kitchen oxybutynin (DITROPAN-XL) 5 MG 24 hr tablet Take 5 mg by mouth daily.    Marland Kitchen oxycodone (OXY-IR) 5 MG capsule Take  5 mg by mouth. 1 po q8hrs as needed for pain    . PERFOROMIST 20 MCG/2ML nebulizer solution     . polyethylene glycol (MIRALAX / GLYCOLAX) 17 g packet Take 17 g by mouth daily.    . pregabalin (LYRICA) 200 MG capsule Take 200 mg by mouth 2 (two) times daily.    . Probiotic Product (ALIGN) 4 MG CAPS Take 4 mg by mouth daily.    Marland Kitchen rOPINIRole (REQUIP) 0.25 MG tablet Take 0.25 mg by mouth at bedtime. 3 tablets qhs    . azelastine (ASTELIN) 0.1 % nasal spray Place 1 spray into both nostrils daily.    . QUEtiapine (SEROQUEL XR) 50 MG TB24 24 hr tablet Take 1 tablet (50 mg total) by mouth at bedtime. 90 each 0   PTA Medications: (Not in a hospital admission)   Musculoskeletal: Strength & Muscle Tone: decreased Gait & Station: did not witness Patient leans: N/A  Psychiatric Specialty Exam: Physical Exam  Constitutional: She is oriented to person, place, and time. She appears well-developed and well-nourished.  HENT:  Head: Normocephalic.  Neck: Normal range of motion.  Respiratory: Effort normal.  Neurological: She is alert and oriented to person, place, and time.  Psychiatric: Her speech is normal and behavior is normal. Judgment and thought content normal. Her affect is blunt. Cognition and memory are impaired.    Review of Systems  Psychiatric/Behavioral: Positive for memory loss.  All other systems reviewed and are negative.   Blood pressure 130/72, pulse 99, temperature 98.7 F (37.1 C), temperature source Oral, resp. rate 15, height 5\' 5"  (1.651 m), weight 129 kg, SpO2 98 %.Body mass index is 47.33 kg/m.  General Appearance: Casual  Eye Contact:  Good  Speech:  Slow   Volume:  Normal  Mood:  Euthymic  Affect:  Blunt  Thought Process:  Coherent and Descriptions of Associations: Intact  Orientation:  Other:  person and place  Thought Content:  Delusions, fixed  Suicidal Thoughts:  No  Homicidal Thoughts:  No  Memory:  Immediate;   Fair Recent;   Fair Remote;   Fair  Judgement:  Fair  Insight:  Fair  Psychomotor Activity:  Decreased  Concentration:  Concentration: Good and Attention Span: Good  Recall:  [employer] of Knowledge:  Fair  Language:  Good  Akathisia:  No  Handed:  Right  AIMS (if indicated):     Assets:  Housing Leisure Time Resilience  ADL's:  Impaired  Cognition:  Impaired,  Mild  Sleep:        Demographic Factors:  Age 9 or older  Loss Factors: NA  Historical Factors: NA  Risk Reduction Factors:   Sense of responsibility to family, Living with another person, especially a relative, Positive social support and Positive therapeutic relationship  Continued Clinical Symptoms:  Fixed delusions  Cognitive Features That Contribute To Risk:  None    Suicide Risk:  Minimal: No identifiable suicidal ideation.  Patients presenting with no risk factors but with morbid ruminations; may be classified as minimal risk based on the severity of the depressive symptoms   Plan Of Care/Follow-up recommendations:  Delusion disorder, persecutory type, continous: -Started Seroquel 50 mg at bedtime   Activity:  as tolerated Diet:  heart healthy diet  Disposition: discharge back to [place], NP 07/19/2018, 9:20 AM

## 2018-07-19 NOTE — ED Notes (Addendum)
Pt discharged back to [facility]. Report given to [place], Therapist, sports. Pt denies SI/HI. Denies pain. Pt transported via [facility]. All belongings returned to pt (given to EMS).

## 2018-07-19 NOTE — Discharge Instructions (Addendum)
Return to the ER for any new or worsening symptoms including any thoughts of wanting to harm yourself or anyone else.  Follow-up with your regular doctor.

## 2018-07-19 NOTE — ED Notes (Signed)
Warm wash cloth was given to patient to wash face and hands at this time

## 2018-07-19 NOTE — TOC Initial Note (Signed)
Transition of Care [facility]) - Initial/Assessment Note    Patient Details  Name: Loretta Wade MRN: [id number] Date of Birth: [birth year]  Transition [facility] CM/SW Contact:    Ross Ludwig, LCSW Phone Number: 07/19/2018, 11:20 AM  Clinical Narrative:                  Patient is a long term care resident at [facility].  Patient is alert and oriented x 3.  Patient has been at [facility] since 2015, per facility patient is normally cooperative and gets along with the staff.  Per Claiborne Billings the facility is able to accept patient back, and she can return.  ( see CSW note from yesterday).  Patient has history of delusions and hallucinations, patient is also paranoid per psych note.  Psychiatrist changed some medications.  Patient plans to return back to [facility] where she is a long term care resident.  Expected Discharge Plan: Skilled Nursing Facility Barriers to Discharge: No Barriers Identified   Patient Goals and CMS Choice Patient states their goals for this hospitalization and ongoing recovery are:: To return back to SNF. CMS Medicare.gov Compare Post Acute Care list provided to:: Patient Choice offered to / list presented to : Patient  Expected Discharge Plan and Services Expected Discharge Plan: [place] In-house Referral: Clinical Social Work Discharge Planning Services: NA Post Acute Care Choice: [place] Living arrangements for the past 2 months: [place] Expected Discharge Date: 07/19/18               DME Arranged: N/A DME Agency: NA                  Prior Living Arrangements/Services Living arrangements for the past 2 months: [place] Lives with:: Facility Resident Patient language and need for interpreter reviewed:: Yes Do you feel safe going back to the place where you live?: Yes      Need for Family Participation in Patient Care: No (Comment) Care giver  support system in place?: Yes (comment)   Criminal Activity/Legal Involvement Pertinent to Current Situation/Hospitalization: No - Comment as needed  Activities of Daily Living      Permission Sought/Granted Permission sought to share information with : Facility Sport and exercise psychologist, Family Supports Permission granted to share information with : Yes, Verbal Permission Granted  Share Information with NAME: Lewis Shock [phone] or [place], [place] [phone] or Miles,Kim Niece [phone] or Miles,Sherri Niece [phone]   Permission granted to share info w AGENCY: SNF admissions        Emotional Assessment Appearance:: Appears older than stated age Attitude/Demeanor/Rapport: Angry Affect (typically observed): Accepting, Appropriate Orientation: : Oriented to Place, Oriented to  Time, Oriented to Self Alcohol / Substance Use: Not Applicable Psych Involvement: No (comment)  Admission diagnosis:  IVC Patient Active Problem List   Diagnosis Date Noted  . Delusional disorder, persecutory type, continuous ([place]) 07/19/2018  . Paranoia ([place])   . Altered mental status, unspecified 07/18/2018  . Weakness generalized 02/08/2018  . Chronic hypoxemic respiratory failure ([place]) 08/07/2017  . Morbid obesity ([place]) 08/07/2017  . Obstructive sleep apnea syndrome 08/07/2017  . Restless legs 08/07/2017  . CHF (congestive heart failure) ([place]) 01/18/2017  . Candidal intertrigo 02/08/2016  . Dysuria 02/08/2016  . Requires assistance with activities of daily living (ADL) 02/08/2016  . Oral thrush 11/10/2015  . Tachypnea 11/01/2015  . Acute non-recurrent maxillary sinusitis 10/29/2015  . Shortness of breath 10/19/2015  .  Fall 07/11/2015  . Dyslipidemia 06/17/2015  . Overflow stress incontinence of urine in female 06/17/2015  . Chronic pain 06/10/2015  . Chronic low back pain (Location of Tertiary source of pain) (Bilateral) (R>L) 06/10/2015  . Chronic shoulder pain  (Location of Secondary source of pain) (Left) 06/10/2015  . Costochondritis (Location of Primary Source of Pain) 06/10/2015  . Long term current use of opiate analgesic 06/10/2015  . Long term prescription opiate use 06/10/2015  . Opiate use 06/10/2015  . Encounter for therapeutic drug level monitoring 06/10/2015  . Palliative care encounter 06/10/2015  . Chronic feet pain (Bilateral) (R>L) 06/10/2015  . Osteoarthritis 06/10/2015  . Osteoarthritis of shoulder (Left) 06/10/2015  . COPD exacerbation ([place]) 04/12/2015  . Poor mobility 04/12/2015  . Fibromyalgia 03/23/2015  . GERD (gastroesophageal reflux disease) 03/23/2015  . MRSA infection 11/17/2014  . Chronic diastolic CHF (congestive heart failure) ([place]) 11/12/2014  . Morbid obesity with BMI of 50.0-59.9, adult ([place]) 11/12/2014  . Neck pain, chronic 11/12/2014  . COPD (chronic obstructive pulmonary disease) ([place]) 11/04/2014  . Urinary tract infection 10/02/2014   PCP:  Clovia Cuff, MD Pharmacy:   [facility] [place], [place] [address] [place] [address] Phone: [phone] Fax: [place], [place] [address] [place] [address] Phone: [phone] Fax: [phone]     Social Determinants of Health (SDOH) Interventions    Readmission Risk Interventions No flowsheet data found.

## 2018-07-19 NOTE — ED Provider Notes (Signed)
-----------------------------------------   6:56 AM on 07/19/2018 -----------------------------------------   Blood pressure 130/72, pulse 99, temperature 98.7 F (37.1 C), temperature source Oral, resp. rate 15, height 5\' 5"  (1.651 m), weight 129 kg, SpO2 98 %.  The patient is calm and cooperative at this time.  There have been no acute events since the last update.  Awaiting disposition plan from Behavioral Medicine team.   Paulette Blanch, MD 07/19/18 [phone]

## 2018-07-19 NOTE — ED Notes (Signed)
Left a HIPAA client message forTijuanna Ilda Wade [phone] at [facility]

## 2018-08-26 ENCOUNTER — Encounter: Payer: Self-pay | Admitting: *Deleted

## 2018-09-20 ENCOUNTER — Non-Acute Institutional Stay: Payer: Medicare Other | Admitting: Nurse Practitioner

## 2018-09-20 ENCOUNTER — Encounter: Payer: Self-pay | Admitting: Nurse Practitioner

## 2018-09-20 VITALS — BP 125/67 | HR 88 | Temp 98.0°F | Resp 18 | Ht 65.0 in | Wt 288.5 lb

## 2018-09-20 DIAGNOSIS — R0602 Shortness of breath: Secondary | ICD-10-CM

## 2018-09-20 DIAGNOSIS — G894 Chronic pain syndrome: Secondary | ICD-10-CM

## 2018-09-20 DIAGNOSIS — Z515 Encounter for palliative care: Secondary | ICD-10-CM

## 2018-09-20 NOTE — Progress Notes (Signed)
Therapist, nutritional Palliative Care Consult Note Telephone: [phone]  Fax: [phone]  PATIENT NAME: Loretta Wade DOB: [birth year] MRN: [id number]  PRIMARY CARE PROVIDER:   Dr Kathleen Lime PROVIDER: Dr Hodges/[facility] RESPONSIBLE PARTY:   Self  RECOMMENDATIONS and PLAN:  1.Palliative care encounter Z51.5; Ongoing monitoring chronic disease management, symptoms, goc with emotional support.  2.Dyspneic R06.00 secondary to congestive heart failure remain stable at present time. Continue daily weights  3.Chronic pain G89.29 monitor on pain scale, monitor efficacy vs adverse side effects. Currently taking  ASSESSMENT:   I visited and observed Loretta Wade. We talked about purpose of palliative care visit and she was in agreement for palliative care follow-up visit. We talked about how she was feeling today. She replies that she is not feeling all that great. I talked about symptoms of pain and she shared that she is currently comfortable. She wishes to become more mobile. When we talk about getting out of bed which is the first step and staff attempts to do that she declines. We talked about that upfront and Loretta Wade endorses she knows that it does start with her. Her will to want to do more for herself. She is working with Psychotherapy and Psychiatry verbalized has been helpful. We talked about overall shortness of breath. We talked about being dependent on oxygen. We talked about Improvement of shortness of breath with more mobility. She verbalized understanding. We did talk about her concerns and paranoia about the staff talking about her. We talked about coping strategies. We talked about delusions. Reassured Loretta Wade that staff has her best interest and they are there to care for her, support her. We talked about challenges with loss of Independence. We talked about Loretta. Wade being under palliative care for several years now  at a different facility then discharged home for hospice and hospitalized with transition to long-term care at [facility] where she does continue palliative care. We talked about with initial palliative console several years ago she was ambulatory with rehab and now she has to have staff help turn her in bed. We talked about the frustration that she does experience. We talked about challenges with loss of Independence. We talked about how her life is very different than she thought it would be. She talked about family Dynamics. Wishes are to continue to be full code with aggressive interventions. We talked about medical goals of care and the importance of helping herself when she is able. We talked about poor motivation. We talked about role of palliative care and plan of care. Will continue to be a full code with aggressive interventions. Continue to encourage Mobility. Therapeutic listening an emotional support provided. Questions answered to satisfaction. She has her own responsible party. After the nursing staff and encouraged them to encourage her to get out of bed. Staff endorses when they do attempt that her wishes are to return right back to bed. We talked about different strategies to keep her sitting in a chair and will try. Will follow up with palliative care in 4 weeks if needed or sooner should she declined.   6 / 30 / 2020 sodium 143, potassium 3.9, chloride 100, CO2 32, calcium 9.4, bun 7.5, creatinine 0.44, glucose 139, albumin 3.7, total protein 6.9, TSH 4.34, B12 773, magnesium 1.9, WBC 6.7, hemoglobin 12.4, hematocrit 37.2, platelets 201  PC 12/04/2014 to 12/24/2014 PC 06/03/2015 to 06/24/2015 PC 07/28/2015 to 07/28/2015  PC 12/06/2017 Hospice 12/25/2014  to 03/23/2015  I spent 45 minutes providing this consultation,  from  12:30pmto 1:15pm. More than 50% of the time in this consultation was spent coordinating communication.   HISTORY OF PRESENT ILLNESS:  Loretta Wade is  a 68 y.o. year old female with multiple medical problems including Congestive heart failure, morbid obesity, peripheral vascular disease, anemia, chronic back pain, COPD, oh too dependent, fibromyalgia, hypertension, obstructive sleep apnea, gerd, osteoarthritis, hyperlipidemia, opiate dependent, adjustment disorder, major depression with hallucinations, vitamin D deficiency. Last primary provider Note 5 / 29 / 2020 for acute cystitis treated with antibiotics. She currently is on an antibiotic for UTI. She was seen by Psychiatry date of service 6 / 8 / 2020 for MDD, anxiety with delusions with persistent systems like schizoaffective. She was started on Seroquel by hospital when she was sent out for Behavioral evaluation. She seems to be tolerating the Seroquel with no medication adjustment at this time. Last psychiatric visit 6 / 18 / 2020 for mood disorder with anxiety delusions/AH. She is prescribed Celexa for mood disorder from my recently started on Seroquel by hospital when sent out for [facility] evaluation. She does take BuSpar for anxiety. Seroquel was increasing appears to be tolerating well no adjustment at this time. She does appear to be calmer, less suspicious and staff reported changes and improvements.Loretta Wade continues to reside in Skilled Long-Term Care Nursing Facility at [facility]. She does romaine bed-bound and refuses to get out of bed. She does require positioning do to morbid obesity and difficult to maneuver in the bariatric bed. She is total ADL dependents. She does have episodes of incontinence. She feeds herself after tray she talked about not feeling setup and weights have been relatively stable. She has been having some difficulty with edema. She does continue to have paranoia and delusions that the staff is talking about her. She is able to verbalize her needs. At present she is lying in bed. She appears obese, debilitated, chronically ill 02 dependent but  comfortable. No visitors present. Palliative Care was asked to help to continue to address goals of care.   CODE STATUS: Full code  PPS: 30% HOSPICE ELIGIBILITY/DIAGNOSIS: TBD  PAST MEDICAL HISTORY:  Past Medical History:  Diagnosis Date   Anemia    CHF (congestive heart failure) (HCC)    Chronic back pain    COPD (chronic obstructive pulmonary disease) (HCC)    on 2L home o2   Edema    Esophageal reflux    Fatigue    Fibromyalgia    Following with pain management   Hypertension    Nocturia    OA (osteoarthritis)    Sleep apnea    not on CPAP    SOCIAL HX:  Social History   Tobacco Use   Smoking status: Former Smoker    Quit date: 11/04/1983    Years since quitting: 34.9   Smokeless tobacco: Never Used   Tobacco comment: quit several years ago - almost 30 years ago  Substance Use Topics   Alcohol use: No    Alcohol/week: 0.0 standard drinks    ALLERGIES:  Allergies  Allergen Reactions   Gabapentin     Pt reports dizziness and abnormal urine labs.   Tramadol Nausea And Vomiting   Vioxx [Rofecoxib]     No reaction listed on MAR.     PERTINENT MEDICATIONS:  Outpatient Encounter Medications as of 09/20/2018  Medication Sig   albuterol (PROVENTIL) (2.5 MG/3ML) 0.083% nebulizer solution USE  ONE TREATMENT EVERY 6 HOURS AS NEEDED FOR WHEEZING OR SHORTNESS OF BREATH   ANORO ELLIPTA 62.5-25 MCG/INH AEPB Inhale 1 puff into the lungs daily.   azelastine (ASTELIN) 0.1 % nasal spray Place 1 spray into both nostrils daily.   busPIRone (BUSPAR) 15 MG tablet Take 7.5 mg by mouth daily. 1/2 tablet daily    cholecalciferol (VITAMIN D) 1000 UNITS tablet Take 1,000 Units by mouth daily.    Cranberry 475 MG CAPS Take by mouth. 1 po daily   diclofenac sodium (VOLTAREN) 1 % GEL Apply 4 g topically 4 (four) times daily.   fluticasone (FLONASE) 50 MCG/ACT nasal spray USE TWO SPRAYS IN EACH NOSTRIL EVERY DAY   furosemide (LASIX) 40 MG tablet Take 1 tablet  (40 mg total) by mouth 2 (two) times daily.   hydroxypropyl methylcellulose / hypromellose (ISOPTO TEARS / GONIOVISC) 2.5 % ophthalmic solution Place 1 drop into both eyes.   lactulose (CHRONULAC) 10 GM/15ML solution Take by mouth 3 (three) times daily. 45gm po tid   Lidocaine (HM LIDOCAINE PATCH) 4 % PTCH Apply topically. Apply once a day   loratadine (CLARITIN) 10 MG tablet Take 10 mg by mouth daily.   magnesium oxide (MAG-OX) 400 MG tablet Take 400 mg by mouth daily.   nystatin (MYCOSTATIN/NYSTOP) powder nystatin 100,000 unit/gram topical powder   omeprazole (PRILOSEC) 20 MG capsule Take 20 mg by mouth daily.   oxybutynin (DITROPAN-XL) 5 MG 24 hr tablet Take 5 mg by mouth daily.   oxycodone (OXY-IR) 5 MG capsule Take 5 mg by mouth. 1 po q8hrs as needed for pain   PERFOROMIST 20 MCG/2ML nebulizer solution    polyethylene glycol (MIRALAX / GLYCOLAX) 17 g packet Take 17 g by mouth daily.   pregabalin (LYRICA) 200 MG capsule Take 200 mg by mouth 2 (two) times daily.   Probiotic Product (ALIGN) 4 MG CAPS Take 4 mg by mouth daily.   QUEtiapine (SEROQUEL XR) 50 MG TB24 24 hr tablet Take 1 tablet (50 mg total) by mouth at bedtime.   rOPINIRole (REQUIP) 0.25 MG tablet Take 0.25 mg by mouth at bedtime. 3 tablets qhs   No facility-administered encounter medications on file as of 09/20/2018.     PHYSICAL EXAM:   General: Obese, chronically ill, pleasant debilitated female Cardiovascular: regular rate and rhythm Pulmonary: clear ant fields Abdomen: soft, nontender, + bowel sounds GU: no suprapubic tenderness Extremities: + edema, no joint deformities Skin: no rashes Neurological: Weakness but otherwise nonfocal/funcational quadriplegic  Ermelinda Eckert [place], NP

## 2018-09-23 ENCOUNTER — Other Ambulatory Visit: Payer: Self-pay

## 2018-10-04 ENCOUNTER — Non-Acute Institutional Stay: Payer: Medicare Other | Admitting: Nurse Practitioner

## 2018-10-04 ENCOUNTER — Encounter: Payer: Self-pay | Admitting: Nurse Practitioner

## 2018-10-04 ENCOUNTER — Other Ambulatory Visit: Payer: Self-pay

## 2018-10-04 VITALS — BP 117/68 | HR 82 | Temp 98.1°F | Resp 18 | Wt 288.5 lb

## 2018-10-04 DIAGNOSIS — Z515 Encounter for palliative care: Secondary | ICD-10-CM

## 2018-10-04 DIAGNOSIS — R0602 Shortness of breath: Secondary | ICD-10-CM

## 2018-10-04 DIAGNOSIS — G894 Chronic pain syndrome: Secondary | ICD-10-CM

## 2018-10-04 NOTE — Progress Notes (Addendum)
Therapist, nutritional Palliative Care Consult Note Telephone: [phone]  Fax: [phone]  PATIENT NAME: Loretta Wade DOB: 15-Sep-1950 MRN: [id number]  PRIMARY CARE PROVIDER:   Annita Brod, MD  REFERRING PROVIDER:  Dr Hodges/[facility] RESPONSIBLE PARTY:   Self  RECOMMENDATIONS and PLAN: 1.Palliative care encounter Z51.5; Ongoing monitoring chronic disease management, symptoms, goc with emotional support.  2.Dyspneic R06.00 secondary to congestive heart failure remain stable at present time. Continue daily weights  3.Chronic pain G89.29 monitor on pain scale, monitor efficacy vs adverse side effects. Currently taking  ASSESSMENT:     I visited and observed Loretta Wade. We talked about purpose for palliative care visit what she requested. We talked about how she was feeling today. She shared that it's hard with social isolation and covid-19 pandemic. She talked about the staff talking about her. She talked about her delusions and that she is concerned that she may be hurt. She denies suicidal ideations. We talked about safety can we talk to better psychotherapy session in addition to medications. We talked about delusions at length. We talked about coping strategies which follow along the line of psychotherapy counseling work with Loretta Wade. We talked about symptoms of pain what she denies. We talked about shortness of breath what she denies. We talked about her appetite. We talked about the importance of mobility and getting out of bed. She politely declined. We talked about challenges residing in Skilled Nursing Facility with loss of Independence. We talked about role of palliative care and plan of care. With delusions will not revisit goals, code status at this visit. Loretta Wade and daughter says she feels much better after palliative care visit. She felt reassured that she is safe. Discuss next palliative care visit in 4 weeks if  needed or sooner should she declined. Loretta Wade an agreement. Emotional support and therapeutic listening provided. Questions answer satisfaction. I updated nursing staff. No new changes to current goals or plan of care, continue Psychotherapy requiring coping strategies and a lot of emotional support, reassuring.  PC 12/04/2014 to 12/24/2014 PC 06/03/2015 to 06/24/2015 PC 07/28/2015 to 07/28/2015  PC 12/06/2017 Hospice 12/25/2014 to 03/23/2015   I spent 40 minutes providing this consultation,  from 12:15pm to 12:50pm. More than 50% of the time in this consultation was spent coordinating communication.   HISTORY OF PRESENT ILLNESS:  Loretta Wade is a 68 y.o. year old female with multiple medical problems including includingCongestive heart failure, morbid obesity, peripheral vascular disease, anemia, chronic back pain, COPD, oh too dependent, fibromyalgia, hypertension, obstructive sleep apnea, gerd, osteoarthritis, hyperlipidemia, opiate dependent, adjustment disorder, major depression with hallucinations, vitamin D deficiency.Loretta Wade continues to reside in Skilled Long-Term Care Nursing Facility at [facility]. Loretta Wade requested palliative care visit today for further discussion of concerns about residing at skilled Long-Term Care Nursing Facility. She does remains bed-bound and declines getting out of bed. She is total ADL dependent with episodes of incontinence. She feeds herself but if are appetite. She is able to verbalize her needs. Last primary provider visit 7 / 10 / 2020 for acute cystitis follow-up and paranoid personality disorder. She did complete 7 Days of antibiotic therapy for cystitis with  culture producing esbl in urine. She does remain a full code with wishes for aggressive interventions. She continues to have significant delusions of feeling that staff is talking about her or not being nice to her. She has followed by Psychotherapy in addition to Psychiatry.  Less psychiatric unit at facility 7/16 / 2020 for history of schizoaffective disorder, delusions come anxiety. She currently is prescribed Seroquel for delusions / sad, Buspar for anxiety, Citalopram for mood. She is tolerating medications. She is suspicious, paranoid with poor decision-making and delusions that other people want to hurt her. Psychotherapy continues to work with Loretta Wade about safety of delusions. The symptoms of been present for months and then slightly improved with BuSpar and Seroquel. At present she is lying in bed. She appears obese, chronically ill, severely debilitated. She does remain on continuous oxygen. No visitors present. Palliative Care was asked to help to continue to address goals of care.   CODE STATUS: Full code  PPS: 30% HOSPICE ELIGIBILITY/DIAGNOSIS: TBD  PAST MEDICAL HISTORY:  Past Medical History:  Diagnosis Date   Anemia    CHF (congestive heart failure) (HCC)    Chronic back pain    COPD (chronic obstructive pulmonary disease) (HCC)    on 2L home o2   Edema    Esophageal reflux    Fatigue    Fibromyalgia    Following with pain management   Hypertension    Nocturia    OA (osteoarthritis)    Sleep apnea    not on CPAP    SOCIAL HX:  Social History   Tobacco Use   Smoking status: Former Smoker    Quit date: 11/04/1983    Years since quitting: 34.9   Smokeless tobacco: Never Used   Tobacco comment: quit several years ago - almost 30 years ago  Substance Use Topics   Alcohol use: No    Alcohol/week: 0.0 standard drinks    ALLERGIES:  Allergies  Allergen Reactions   Gabapentin     Pt reports dizziness and abnormal urine labs.   Tramadol Nausea And Vomiting   Vioxx [Rofecoxib]     No reaction listed on MAR.     PERTINENT MEDICATIONS:  Outpatient Encounter Medications as of 10/04/2018  Medication Sig   busPIRone (BUSPAR) 15 MG tablet Take 7.5 mg by mouth daily. 1/2 tablet daily    guaiFENesin (MUCINEX) 600 MG  12 hr tablet Take 1,200 mg by mouth every 6 (six) hours as needed.   hydroxypropyl methylcellulose / hypromellose (ISOPTO TEARS / GONIOVISC) 2.5 % ophthalmic solution Place 1 drop into both eyes.   lactulose (CHRONULAC) 10 GM/15ML solution Take by mouth 3 (three) times daily. 45gm po tid   meclizine (ANTIVERT) 25 MG tablet Take 25 mg by mouth every 8 (eight) hours.   ondansetron (ZOFRAN) 4 MG tablet Take 4 mg by mouth every 8 (eight) hours as needed for nausea or vomiting.   QUEtiapine (SEROQUEL XR) 50 MG TB24 24 hr tablet Take 1 tablet (50 mg total) by mouth at bedtime. (Patient taking differently: Take 75 mg by mouth at bedtime. )   albuterol (PROVENTIL) (2.5 MG/3ML) 0.083% nebulizer solution USE ONE TREATMENT EVERY 6 HOURS AS NEEDED FOR WHEEZING OR SHORTNESS OF BREATH (Patient not taking: Reported on 10/04/2018)   ANORO ELLIPTA 62.5-25 MCG/INH AEPB Inhale 1 puff into the lungs daily.   azelastine (ASTELIN) 0.1 % nasal spray Place 1 spray into both nostrils daily.   cholecalciferol (VITAMIN D) 1000 UNITS tablet Take 1,000 Units by mouth daily.    Cranberry 475 MG CAPS Take by mouth. 1 po daily   diclofenac sodium (VOLTAREN) 1 % GEL Apply 4 g topically 4 (four) times daily.   fluticasone (FLONASE) 50 MCG/ACT nasal spray USE TWO SPRAYS IN EACH NOSTRIL EVERY  DAY (Patient not taking: Reported on 10/04/2018)   furosemide (LASIX) 40 MG tablet Take 1 tablet (40 mg total) by mouth 2 (two) times daily. (Patient not taking: Reported on 10/04/2018)   Lidocaine (HM LIDOCAINE PATCH) 4 % PTCH Apply topically. Apply once a day   loratadine (CLARITIN) 10 MG tablet Take 10 mg by mouth daily.   magnesium oxide (MAG-OX) 400 MG tablet Take 400 mg by mouth daily.   nystatin (MYCOSTATIN/NYSTOP) powder nystatin 100,000 unit/gram topical powder   omeprazole (PRILOSEC) 20 MG capsule Take 20 mg by mouth daily.   oxybutynin (DITROPAN-XL) 5 MG 24 hr tablet Take 5 mg by mouth daily.   oxycodone (OXY-IR) 5  MG capsule Take 5 mg by mouth. 1 po q8hrs as needed for pain   PERFOROMIST 20 MCG/2ML nebulizer solution    polyethylene glycol (MIRALAX / GLYCOLAX) 17 g packet Take 17 g by mouth daily.   pregabalin (LYRICA) 200 MG capsule Take 200 mg by mouth 2 (two) times daily.   Probiotic Product (ALIGN) 4 MG CAPS Take 4 mg by mouth daily.   rOPINIRole (REQUIP) 0.25 MG tablet Take 0.25 mg by mouth at bedtime. 3 tablets qhs   No facility-administered encounter medications on file as of 10/04/2018.     PHYSICAL EXAM:   General: NAD, obese, debilitated female Cardiovascular: regular rate and rhythm Pulmonary: clear ant fields Abdomen: soft, nontender, + bowel sounds Extremities: + edema, no joint deformities Neurological: Weakness but otherwise nonfocal/non-ambulatory  Malonie Tatum [place], NP

## 2018-10-07 ENCOUNTER — Other Ambulatory Visit: Payer: Self-pay

## 2018-10-11 ENCOUNTER — Encounter: Payer: Self-pay | Admitting: Nurse Practitioner

## 2018-10-11 ENCOUNTER — Non-Acute Institutional Stay: Payer: Medicare Other | Admitting: Nurse Practitioner

## 2018-10-11 VITALS — BP 131/79 | HR 84 | Resp 20 | Wt 293.0 lb

## 2018-10-11 DIAGNOSIS — Z515 Encounter for palliative care: Secondary | ICD-10-CM

## 2018-10-11 NOTE — Progress Notes (Signed)
Therapist, nutritional Palliative Care Consult Note Telephone: [phone]  Fax: [phone]  PATIENT NAME: Loretta Wade DOB: 03-Nov-1950 MRN: [id number]  PRIMARY CARE PROVIDER:   Dr Kathleen Lime PROVIDER: Dr Hodges/[facility] RESPONSIBLE PARTY:   Self/Brandon Cruces son [phone]  RECOMMENDATIONS and PLAN: 1.ACP: full code; full scope interventions  2.Generalized weaknesssecondary to obesity, CHF. Encourage energy conservation and rest times.  3.Dyspneic secondary to congestive heart failure remain stable at present time. Continue daily weights  4.Chronic pain monitor on pain scale, monitor efficacy vs adverse side effects. Currently taking voltaren gel; increase mobility recommended  5. Palliative care encounter Ongoing monitoring chronic disease management, symptoms, goc with emotional support.  I spent 45 minutes providing this consultation,  from 9:30am to 10:15am. More than 50% of the time in this consultation was spent coordinating communication.   HISTORY OF PRESENT ILLNESS:  Loretta Wade is a 68 y.o. year old female with multiple medical problems including congestive heart failure, morbid obesity, for full vascular disease, anemia, chronic back pain, COPD, on oxygen continuous, fibromyalgia, gerd, hypertension, osteoarthritis, obstructive sleep apnea, hyperlipidemia, opiate dependent, adjustment disorder, major depression with hallucinations, vitamin D deficiency . Ms. Ricchio continues to reside in [facility]. Palliative care working on goals of care to include code status and currently wishes are for aggressive interventions though intermittent non-compliance. She is total ADL dependent, requires assistance for turning secondary to severe obesity and her bed. She is incontinent intermittently. She does feed herself after tray setup. She is on a regular  diet, regular texture with regular liquid consistency. She does continue on continuous oxygen for chronic respiratory failure. She is able to verbalize her needs. She has continued to have ongoing delusions that staff will harm her. She is followed by Psychiatry with medication adjustment. There is an order for Ms Greenhalgh to get up daily and stop does offer for which she does the climb. Ms Ishida requested palliative care visit for today for further discussion of pain in her right foot. At present she is lying in bed, appears morbidly obese, chronically ill, no acute distress. No visitors present. I visited and observed Ms.Radle. We talked about purpose for palliative care visit as she requested follow up visit for today. We talked about how she was feeling. She shared that her leg was hurting, the Voltaren gel was not helping. She wanted to talk at length about mobility. She shared that she was agreeable to getting out of bed. Discuss with Ms. Draeger that when did ask her to get out of bed she was agreeable yet when staff came to get her up she declined. This is been going on for multiple ongoing months, she has not been out of bed likely in over a year. We talked at length about chronic disease progression. We talked about positioning. We talked about the importance of self-care and helping yourself. Discussed at length with Ms Brinkmann goals of care. She wishes to continue aggressive interventions, full code. Discuss shortness of breath what she currently denies though she remains on continuous oxygen. We did talk about pain regimen including Voltaren gel, repositioning and Mobility. We talked about her appetite. We talked about delusions. We talked about role of palliative care and plan of care. Discuss that will follow up in 2 weeks to see if she follow through with being out of bed. Will continue with psychiatry in addition to psychotherapy. I updated primary provider and  nursing staff. Therapeutic  listening and emotional support provided. Questions answered to satisfaction. Palliative Care was asked to help to continue to address goals of care.   CODE STATUS: Full code  PPS: 30% HOSPICE ELIGIBILITY/DIAGNOSIS: TBD  PAST MEDICAL HISTORY:  Past Medical History:  Diagnosis Date  . Anemia   . CHF (congestive heart failure) (HCC)   . Chronic back pain   . COPD (chronic obstructive pulmonary disease) (HCC)    on 2L home o2  . Edema   . Esophageal reflux   . Fatigue   . Fibromyalgia    Following with pain management  . Hypertension   . Nocturia   . OA (osteoarthritis)   . Sleep apnea    not on CPAP    SOCIAL HX:  Social History   Tobacco Use  . Smoking status: Former Smoker    Quit date: 11/04/1983    Years since quitting: 66  . Smokeless tobacco: Never Used  . Tobacco comment: quit several years ago - almost 30 years ago  Substance Use Topics  . Alcohol use: No    Alcohol/week: 0.0 standard drinks    ALLERGIES:  Allergies  Allergen Reactions  . Gabapentin     Pt reports dizziness and abnormal urine labs.  . Tramadol Nausea And Vomiting  . Vioxx [Rofecoxib]     No reaction listed on MAR.     PERTINENT MEDICATIONS:  Outpatient Encounter Medications as of 10/11/2018  Medication Sig  . albuterol (PROVENTIL) (2.5 MG/3ML) 0.083% nebulizer solution USE ONE TREATMENT EVERY 6 HOURS AS NEEDED FOR WHEEZING OR SHORTNESS OF BREATH  . ANORO ELLIPTA 62.5-25 MCG/INH AEPB Inhale 1 puff into the lungs daily.  Marland Kitchen azelastine (ASTELIN) 0.1 % nasal spray Place 1 spray into both nostrils daily.  . busPIRone (BUSPAR) 15 MG tablet Take 7.5 mg by mouth daily. 1/2 tablet daily   . cholecalciferol (VITAMIN D) 1000 UNITS tablet Take 1,000 Units by mouth daily.   . Cranberry 475 MG CAPS Take by mouth. 1 po daily  . diclofenac sodium (VOLTAREN) 1 % GEL Apply 4 g topically 4 (four) times daily.  . fluticasone (FLONASE) 50 MCG/ACT nasal spray USE TWO SPRAYS IN EACH NOSTRIL EVERY DAY  .  furosemide (LASIX) 40 MG tablet Take 1 tablet (40 mg total) by mouth 2 (two) times daily.  Marland Kitchen guaiFENesin (MUCINEX) 600 MG 12 hr tablet Take 1,200 mg by mouth every 6 (six) hours as needed.  . hydroxypropyl methylcellulose / hypromellose (ISOPTO TEARS / GONIOVISC) 2.5 % ophthalmic solution Place 1 drop into both eyes.  Marland Kitchen lactulose (CHRONULAC) 10 GM/15ML solution Take by mouth 3 (three) times daily. 45gm po tid  . Lidocaine (HM LIDOCAINE PATCH) 4 % PTCH Apply topically. Apply once a day  . loratadine (CLARITIN) 10 MG tablet Take 10 mg by mouth daily.  . magnesium oxide (MAG-OX) 400 MG tablet Take 400 mg by mouth daily.  . meclizine (ANTIVERT) 25 MG tablet Take 25 mg by mouth every 8 (eight) hours.  Marland Kitchen nystatin (MYCOSTATIN/NYSTOP) powder nystatin 100,000 unit/gram topical powder  . omeprazole (PRILOSEC) 20 MG capsule Take 20 mg by mouth daily.  . ondansetron (ZOFRAN) 4 MG tablet Take 4 mg by mouth every 8 (eight) hours as needed for nausea or vomiting.  Marland Kitchen oxybutynin (DITROPAN-XL) 5 MG 24 hr tablet Take 5 mg by mouth daily.  Marland Kitchen oxycodone (OXY-IR) 5 MG capsule Take 5 mg by mouth. 1 po q8hrs as needed for pain  . PERFOROMIST 20 MCG/2ML nebulizer  solution   . polyethylene glycol (MIRALAX / GLYCOLAX) 17 g packet Take 17 g by mouth daily.  . pregabalin (LYRICA) 200 MG capsule Take 200 mg by mouth 2 (two) times daily.  . Probiotic Product (ALIGN) 4 MG CAPS Take 4 mg by mouth daily.  . QUEtiapine (SEROQUEL XR) 50 MG TB24 24 hr tablet Take 1 tablet (50 mg total) by mouth at bedtime. (Patient taking differently: Take 75 mg by mouth at bedtime. )  . rOPINIRole (REQUIP) 0.25 MG tablet Take 0.25 mg by mouth at bedtime. 3 tablets qhs   No facility-administered encounter medications on file as of 10/11/2018.     PHYSICAL EXAM:   General: NAD, obese, chronically ill, bedbound female Cardiovascular: regular rate and rhythm Pulmonary: clear ant fields Abdomen: soft, nontender, + bowel sounds Extremities: +  edema, no joint deformities Neurological: Weakness but otherwise nonfocal/functionally quadriplegic  Adwoa Axe [place], NP

## 2018-10-14 ENCOUNTER — Other Ambulatory Visit: Payer: Self-pay

## 2018-12-04 ENCOUNTER — Non-Acute Institutional Stay: Payer: Medicare Other | Admitting: Nurse Practitioner

## 2018-12-04 ENCOUNTER — Encounter: Payer: Self-pay | Admitting: Nurse Practitioner

## 2018-12-04 VITALS — BP 148/82 | HR 61 | Temp 98.4°F | Resp 18 | Wt 293.4 lb

## 2018-12-04 DIAGNOSIS — Z515 Encounter for palliative care: Secondary | ICD-10-CM

## 2018-12-04 NOTE — Progress Notes (Signed)
[place] Consult Note Telephone: [phone]  Fax: [phone]  PATIENT NAME: Loretta Wade DOB: [birth year] MRN: [id number]  PRIMARY CARE PROVIDER:   Dr Yves Dill PROVIDER: Dr Hodges/[facility] RESPONSIBLE PARTY:   Self/Brandon Jolly son [phone]  RECOMMENDATIONS and PLAN: 1.ACP: full code; full scope interventions  2.Generalized weaknesssecondary to obesity, CHF. Encourage energy conservation and rest times.  3.Dyspneic secondary to congestive heart failure remain stable at present time. Continue daily weights  4. Palliative care encounter Palliative medicine team will continue to support patient, patient's family, and medical team. Visit consisted of counseling and education dealing with the complex and emotionally intense issues of symptom management and palliative care in the setting of serious and potentially life-threatening illness  I spent 65 minutes providing this consultation,  from 3:15pm to 4:20pm. More than 50% of the time in this consultation was spent coordinating communication.   HISTORY OF PRESENT ILLNESS:  Loretta Wade is a 68 y.o. year old female with multiple medical problems including congestive heart failure, morbid obesity, for full vascular disease, anemia, chronic back pain, COPD, on oxygen continuous, fibromyalgia, gerd, hypertension, osteoarthritis, obstructive sleep apnea, hyperlipidemia, opiate dependent, adjustment disorder, major depression with hallucinations, vitamin D deficiency. Loretta Wade continues to reside in [place] at [facility]. She does remain bed-bound, where lift to a chair. His total ADL dependence and require stuff to help turn and position her secondary to severe obesity. Loretta Wade does feed herself an appetite has been good. Last primary provider visit 9 / 9 / 2020 for follow-up fibromyalgia,  renal cysts in hepatic encephalopathy. She does continue to have elevated ammonia levels with confusion and drowsiness. Rifampin therapy done for two weeks and then repeated ammonia level without Improvement. Renal cyst to left kidney ongoing and continues to complain of intermittent abdominal pain. Nephrology referral ordered and consult done on 9/9 / 2020 by Dr. Candiss Norse by Telehealth. continues to have delusions and hallucinations. She does need to be followed by Psychiatry with last day of service 9/17 / 2020 for follow up after medication change for increasing dose of Buspar and she has tolerated well. No new adjustments at this visit. 9 / 29 / 2020 episode of unresponsiveness with intermittent arousal, she received lactulose enema which resulted in a bowel movement and she became more awake, oriented. No further episodes. Palliative care to see today for follow-up visit for chronic disease management, goals of care and ACP. At present Loretta Wade appears the debilitated but comfortable. No visitors present. I visited and observed Loretta Wade. We talked about purpose of palliative care visit and she was in agreement. We talked about had she was feeling today. She replies that she is doing okay. She feels better sitting up. Praised Loretta Wade for getting out of bed as this is not her normal routine. We talked about the importance of mobility coming being out of her room, social aspects. We talked about symptoms of pain but she currently is not experiencing. We talked about shortness of breath what she does experience intermittently. She does remain on continuous oxygen. We talked about mobility and positioning with being in a recliner. We talked about her appetite. She talked about an event that happened to her yesterday. She became unresponsive. She talked at length about feeling like she was leaving this world yesterday. We talked about has she feels more today. She was thankful that she did not. She  continues  to have delusions about the staff trying to harm her. We talked about positive motivating topics to include feeding herself, talking on her phone doing the things that she can for herself. She does continue to remain a full code. Medical goals are to continue aggressive interventions. We talked about role of palliative care and plan of care. Therapeutic listening and emotional support provided. Loretta Wade endorses she's feeling better some palliative care visit. Questions answered to satisfaction. No new changes to current goals are plan of care. Discussed with Loretta Wade will follow up in one month if needed or sooner should she did fine. Loretta Wade in agreement. I have updated nursing staff.  WBC 3.7, hemoglobin 11.8, hematocrit, platelets 162, sodium 146, potassium 3.7, chloride 100, Co2 35, calcium 8.7, bun 8.6, creatinine 0.53, glucose 97, albumin 3.4, total protein 7.0  Palliative Care was asked to help to continue to address goals of care.   CODE STATUS: DNR  PPS: 30% HOSPICE ELIGIBILITY/DIAGNOSIS: TBD  PAST MEDICAL HISTORY:  Past Medical History:  Diagnosis Date  . Anemia   . CHF (congestive heart failure) ([place])   . Chronic back pain   . COPD (chronic obstructive pulmonary disease) (HCC)    on 2L home o2  . Edema   . Esophageal reflux   . Fatigue   . Fibromyalgia    Following with pain management  . Hypertension   . Nocturia   . OA (osteoarthritis)   . Sleep apnea    not on CPAP    SOCIAL HX:  Social History   Tobacco Use  . Smoking status: Former Smoker    Quit date: 11/04/1983    Years since quitting: 35.1  . Smokeless tobacco: Never Used  . Tobacco comment: quit several years ago - almost 30 years ago  Substance Use Topics  . Alcohol use: No    Alcohol/week: 0.0 standard drinks    ALLERGIES:  Allergies  Allergen Reactions  . Gabapentin     Pt reports dizziness and abnormal urine labs.  . Tramadol Nausea And Vomiting  . Vioxx [Rofecoxib]     No reaction  listed on MAR.     PERTINENT MEDICATIONS:  Outpatient Encounter Medications as of 12/04/2018  Medication Sig  . albuterol (PROVENTIL) (2.5 MG/3ML) 0.083% nebulizer solution USE ONE TREATMENT EVERY 6 HOURS AS NEEDED FOR WHEEZING OR SHORTNESS OF BREATH  . ANORO ELLIPTA 62.5-25 MCG/INH AEPB Inhale 1 puff into the lungs daily.  Marland Kitchen azelastine (ASTELIN) 0.1 % nasal spray Place 1 spray into both nostrils daily.  . busPIRone (BUSPAR) 15 MG tablet Take 7.5 mg by mouth daily. 1/2 tablet daily   . cholecalciferol (VITAMIN D) 1000 UNITS tablet Take 1,000 Units by mouth daily.   . Cranberry 475 MG CAPS Take by mouth. 1 po daily  . diclofenac sodium (VOLTAREN) 1 % GEL Apply 4 g topically 4 (four) times daily.  . fluticasone (FLONASE) 50 MCG/ACT nasal spray USE TWO SPRAYS IN EACH NOSTRIL EVERY DAY  . furosemide (LASIX) 40 MG tablet Take 1 tablet (40 mg total) by mouth 2 (two) times daily.  Marland Kitchen guaiFENesin (MUCINEX) 600 MG 12 hr tablet Take 1,200 mg by mouth every 6 (six) hours as needed.  . hydroxypropyl methylcellulose / hypromellose (ISOPTO TEARS / GONIOVISC) 2.5 % ophthalmic solution Place 1 drop into both eyes.  Marland Kitchen lactulose (CHRONULAC) 10 GM/15ML solution Take by mouth 3 (three) times daily. 45gm po tid  . Lidocaine (HM LIDOCAINE PATCH) 4 % PTCH Apply  topically. Apply once a day  . loratadine (CLARITIN) 10 MG tablet Take 10 mg by mouth daily.  . magnesium oxide (MAG-OX) 400 MG tablet Take 400 mg by mouth daily.  . meclizine (ANTIVERT) 25 MG tablet Take 25 mg by mouth every 8 (eight) hours.  Marland Kitchen nystatin (MYCOSTATIN/NYSTOP) powder nystatin 100,000 unit/gram topical powder  . omeprazole (PRILOSEC) 20 MG capsule Take 20 mg by mouth daily.  . ondansetron (ZOFRAN) 4 MG tablet Take 4 mg by mouth every 8 (eight) hours as needed for nausea or vomiting.  Marland Kitchen oxybutynin (DITROPAN-XL) 5 MG 24 hr tablet Take 5 mg by mouth daily.  Marland Kitchen oxycodone (OXY-IR) 5 MG capsule Take 5 mg by mouth. 1 po q8hrs as needed for pain  .  PERFOROMIST 20 MCG/2ML nebulizer solution   . polyethylene glycol (MIRALAX / GLYCOLAX) 17 g packet Take 17 g by mouth daily.  . pregabalin (LYRICA) 200 MG capsule Take 200 mg by mouth 2 (two) times daily.  . Probiotic Product (ALIGN) 4 MG CAPS Take 4 mg by mouth daily.  . QUEtiapine (SEROQUEL XR) 50 MG TB24 24 hr tablet Take 1 tablet (50 mg total) by mouth at bedtime. (Patient taking differently: Take 75 mg by mouth at bedtime. )  . rOPINIRole (REQUIP) 0.25 MG tablet Take 0.25 mg by mouth at bedtime. 3 tablets qhs   No facility-administered encounter medications on file as of 12/04/2018.     PHYSICAL EXAM:   General: Obese, debilitated, chronically ill delusional female Cardiovascular: regular rate and rhythm Pulmonary: clear ant fields Abdomen: Obese soft, nontender, + bowel sounds Extremities: BLE edema, no joint deformities Neurological: functionally quadriplegic   Ihor Gully, NP

## 2018-12-05 ENCOUNTER — Other Ambulatory Visit: Payer: Self-pay

## 2019-01-01 ENCOUNTER — Non-Acute Institutional Stay: Payer: Medicare Other | Admitting: Nurse Practitioner

## 2019-01-01 ENCOUNTER — Encounter: Payer: Self-pay | Admitting: Nurse Practitioner

## 2019-01-01 VITALS — BP 130/62 | HR 72 | Temp 97.7°F | Resp 18 | Wt 294.0 lb

## 2019-01-01 DIAGNOSIS — Z515 Encounter for palliative care: Secondary | ICD-10-CM

## 2019-01-01 NOTE — Progress Notes (Signed)
Therapist, nutritional Palliative Care Consult Note Telephone: [phone]  Fax: [phone]  PATIENT NAME: Loretta Wade DOB: 1950-05-22 MRN: [id number] PRIMARY CARE PROVIDER:   Dr Kathleen Lime PROVIDER:Dr Hodges/[facility] RESPONSIBLE PARTY:Self/Brandon Blanford son [phone]  RECOMMENDATIONS and PLAN: 1.ACP: full code; full scope interventions  2.Generalized weaknesssecondary to obesity, CHF. Encourage energy conservation and rest times.  3.Dyspneic secondary to congestive heart failure remain stable at present time. Continue daily weights; recent covid infection currently negative  4.Palliative care encounter Palliative medicine team will continue to support patient, patient's family, and medical team. Visit consisted of counseling and education dealing with the complex and emotionally intense issues of symptom management and palliative care in the setting of serious and potentially life-threatening illness  I spent 45 minutes providing this consultation,  from 3:00pm to 3:45pm. More than 50% of the time in this consultation was spent coordinating communication.   HISTORY OF PRESENT ILLNESS:  Loretta Wade is a 68 y.o. year old female with multiple medical problems including congestive heart failure, morbid obesity, for full vascular disease, anemia, chronic back pain, COPD, on oxygen continuous, fibromyalgia, gerd, hypertension, osteoarthritis, obstructive sleep apnea, hyperlipidemia, opiate dependent, adjustment disorder, major depression with hallucinations, vitamin D deficiency. Loretta Wade continues to reside in [facility] at [facility]. She does remain bed-bound, she has to be hoyer to chair. She has not been out of bed and a few weeks secondary to covid-19. She is now tested negative so she has moved back to a regular room. Loretta. Wade does remsin total ADL dependence  with episodes of incontinence. Loretta. Wade does feed herself and appetite has been improving. Staff endorses shortness of breath is better and she is no longer on her continuous oxygen. Staff endorses Loretta. Wade continues to have delusions of staff trying to hurt her. She does continue to be followed by Psychiatry at the facility. She does continue to take Oxycodone 5 mg every 6 hours as needed for chronic pain. She has required  10 mg / 24 hours. Staff endorses she is using her incentive spirometer. Loretta. Wade has continued to take her lactulose. At present Loretta. Wade is lying in bed. She appears over all chronically ill but comfortable, no distress. No visitors present. I visited and observed Loretta Wade. We talked about purpose of palliative care visit and she was in agreement. We talked about how she was feeling overall. She shared that she is glad that she is over covid-19. She talked about being scared that she was sick. We talked about symptoms of pain when she denies. We talked about symptoms of shortness of breath which have improved. We talked about her appetite which is improving. We talked about the importance of mobility and getting out of bed. Loretta Wade talked about her delusions and fears about staff trying to hurt her. We talked about the delusions and coping strategies. Reassured and discussed her that she was safe and that staff was there to help and take care of her. We talked about residing at [facility] and challenges involved. We talked about medical goals of care for which she is a full code and wants aggressive interventions though when offered at time she declined. We talked about role of palliative care and plan of care. We talked about chronic disease progression, obesity. Therapeutic listening and emotional support provided. Questions answered to satisfaction. I updated nursing staff has she currently has her  own responsible party.  Palliative Care  was asked to help to continue to address goals of care.   CODE STATUS: Full code  PPS: 30% HOSPICE ELIGIBILITY/DIAGNOSIS: TBD  PAST MEDICAL HISTORY:  Past Medical History:  Diagnosis Date  . Anemia   . CHF (congestive heart failure) (HCC)   . Chronic back pain   . COPD (chronic obstructive pulmonary disease) (HCC)    on 2L home o2  . Edema   . Esophageal reflux   . Fatigue   . Fibromyalgia    Following with pain management  . Hypertension   . Nocturia   . OA (osteoarthritis)   . Sleep apnea    not on CPAP    SOCIAL HX:  Social History   Tobacco Use  . Smoking status: Former Smoker    Quit date: 11/04/1983    Years since quitting: 35.1  . Smokeless tobacco: Never Used  . Tobacco comment: quit several years ago - almost 30 years ago  Substance Use Topics  . Alcohol use: No    Alcohol/week: 0.0 standard drinks    ALLERGIES:  Allergies  Allergen Reactions  . Gabapentin     Pt reports dizziness and abnormal urine labs.  . Tramadol Nausea And Vomiting  . Vioxx [Rofecoxib]     No reaction listed on MAR.     PERTINENT MEDICATIONS:  Outpatient Encounter Medications as of 01/01/2019  Medication Sig  . albuterol (PROVENTIL) (2.5 MG/3ML) 0.083% nebulizer solution USE ONE TREATMENT EVERY 6 HOURS AS NEEDED FOR WHEEZING OR SHORTNESS OF BREATH  . ANORO ELLIPTA 62.5-25 MCG/INH AEPB Inhale 1 puff into the lungs daily.  Marland Kitchen azelastine (ASTELIN) 0.1 % nasal spray Place 1 spray into both nostrils daily.  . busPIRone (BUSPAR) 15 MG tablet Take 7.5 mg by mouth daily. 1/2 tablet daily   . cholecalciferol (VITAMIN D) 1000 UNITS tablet Take 1,000 Units by mouth daily.   . Cranberry 475 MG CAPS Take by mouth. 1 po daily  . diclofenac sodium (VOLTAREN) 1 % GEL Apply 4 g topically 4 (four) times daily.  . fluticasone (FLONASE) 50 MCG/ACT nasal spray USE TWO SPRAYS IN EACH NOSTRIL EVERY DAY  . furosemide (LASIX) 40 MG tablet Take 1 tablet (40 mg total) by mouth 2 (two) times daily.  Marland Kitchen  guaiFENesin (MUCINEX) 600 MG 12 hr tablet Take 1,200 mg by mouth every 6 (six) hours as needed.  . hydroxypropyl methylcellulose / hypromellose (ISOPTO TEARS / GONIOVISC) 2.5 % ophthalmic solution Place 1 drop into both eyes.  Marland Kitchen lactulose (CHRONULAC) 10 GM/15ML solution Take by mouth 3 (three) times daily. 45gm po tid  . Lidocaine (HM LIDOCAINE PATCH) 4 % PTCH Apply topically. Apply once a day  . loratadine (CLARITIN) 10 MG tablet Take 10 mg by mouth daily.  . magnesium oxide (MAG-OX) 400 MG tablet Take 400 mg by mouth daily.  . meclizine (ANTIVERT) 25 MG tablet Take 25 mg by mouth every 8 (eight) hours.  Marland Kitchen nystatin (MYCOSTATIN/NYSTOP) powder nystatin 100,000 unit/gram topical powder  . omeprazole (PRILOSEC) 20 MG capsule Take 20 mg by mouth daily.  . ondansetron (ZOFRAN) 4 MG tablet Take 4 mg by mouth every 8 (eight) hours as needed for nausea or vomiting.  Marland Kitchen oxybutynin (DITROPAN-XL) 5 MG 24 hr tablet Take 5 mg by mouth daily.  Marland Kitchen oxycodone (OXY-IR) 5 MG capsule Take 5 mg by mouth. 1 po q8hrs as needed for pain  . PERFOROMIST 20 MCG/2ML nebulizer solution   . polyethylene glycol (MIRALAX /  GLYCOLAX) 17 g packet Take 17 g by mouth daily.  . pregabalin (LYRICA) 200 MG capsule Take 200 mg by mouth 2 (two) times daily.  . Probiotic Product (ALIGN) 4 MG CAPS Take 4 mg by mouth daily.  . QUEtiapine (SEROQUEL XR) 50 MG TB24 24 hr tablet Take 1 tablet (50 mg total) by mouth at bedtime. (Patient taking differently: Take 75 mg by mouth at bedtime. )  . rOPINIRole (REQUIP) 0.25 MG tablet Take 0.25 mg by mouth at bedtime. 3 tablets qhs   No facility-administered encounter medications on file as of 01/01/2019.     PHYSICAL EXAM:   General: obese, debilitated, delusional pleasant female Cardiovascular: regular rate and rhythm Pulmonary: clear ant fields;decrease bases Abdomen: obese soft, nontender, + bowel sounds Extremities: edema, no joint deformities Neurological: functional quadriplegic   Rudolfo Brandow Prince [place], NP

## 2019-01-02 ENCOUNTER — Other Ambulatory Visit: Payer: Self-pay

## 2019-01-17 ENCOUNTER — Non-Acute Institutional Stay: Payer: Medicare Other | Admitting: Nurse Practitioner

## 2019-01-17 ENCOUNTER — Other Ambulatory Visit: Payer: Self-pay

## 2019-01-17 ENCOUNTER — Encounter: Payer: Self-pay | Admitting: Nurse Practitioner

## 2019-01-17 VITALS — BP 119/59 | HR 68 | Temp 97.8°F | Resp 18 | Wt 294.0 lb

## 2019-01-17 DIAGNOSIS — Z515 Encounter for palliative care: Secondary | ICD-10-CM

## 2019-01-17 DIAGNOSIS — I509 Heart failure, unspecified: Secondary | ICD-10-CM

## 2019-01-17 NOTE — Progress Notes (Signed)
Therapist, nutritional Palliative Care Consult Note Telephone: [phone]  Fax: [phone]  PATIENT NAME: JULIO KAATZ DOB: 07-11-1950 MRN: [id number]   PRIMARY CARE PROVIDER:Dr Kathleen Lime PROVIDER:Dr Hodges/[facility] RESPONSIBLE PARTY:Self/Brandon Henegar son [phone]  RECOMMENDATIONS and PLAN: 1.ACP: full code; full scope interventions  2.Generalized weaknesssecondary to obesity, CHF. Encourage energy conservation and rest times.  3.Dyspneic secondary to congestive heart failure remain stable at present time. Continue daily weights; recent covid infection currently negative  4.Palliative care encounter Palliative medicine team will continue to support patient, patient's family, and medical team. Visit consisted of counseling and education dealing with the complex and emotionally intense issues of symptom management and palliative care in the setting of serious and potentially life-threatening illness  HISTORY OF PRESENT ILLNESS:  KYANNE ESKIN is a 68 y.o. year old female with multiple medical problems including  congestive heart failure, morbid obesity, for full vascular disease, anemia, chronic back pain, COPD, on oxygen continuous, fibromyalgia, gerd, hypertension, osteoarthritis, obstructive sleep apnea, hyperlipidemia, opiate dependent, adjustment disorder, major depression with hallucinations, vitamin D deficiency. Ms Gillings continues to reside in [facility] at [facility]. She is a Nurse, adult to the chair for which staff has started getting her back up again. She has been covid positive but now has completed her isolation days, remains asymptomatic. Ms. Prucha is total ADL dependents, require staff to turn in position her. Ms. Gatzke does have episodes of incontinence although she is able to verbalize when she needs to use the bathroom. She does feed herself  after tray setup. Appetite has been improving. Ms. Pelham is able to verbalize your needs. Staff endorses Ms. Netherland continues to have delusions. No recent wounds, falls, hospitalizations. At present she is sitting in the recliner in the hall. She appears chronically ill but comfortable. No visitors present. I visited and observe Ms Divelbiss. We talked about purpose of palliative care visit and she was in agreement. We talked about how she was feeling today. Ms. Menger verbalize that she is doing much better. Ms Kines endorses she is so thankful to be sitting in a recliner outside her room. We talked about challenges with social isolation. We talked about symptoms of pain and shortness of breath what she denies. Currently Ms. List does not have her oxygen on and she appears comfortable speaking full sentences without difficulty. We talked about her appetite which is improving. Ms Ditton revisited the challenges with social isolation. She did talk about staff, delusions though improved. We talked about medical and her wishes are to continue to be a full code and focus on aggressive interventions. We talked about role of palliative care and plan of care. Discuss that will follow up in 4 weeks if needed or sooner should she declined. Ms Donis and agreement. I have updated nursing staff new new changes to current goals or plan of care. Therapeutic listening and emotional support provided.  Palliative Care was asked to help to continue to address goals of care.  I spent 40 minutes providing this consultation,  from 11:15am to 11:50am. More than 50% of the time in this consultation was spent coordinating communication.   CODE STATUS: DNR  PPS: 40% HOSPICE ELIGIBILITY/DIAGNOSIS: TBD  PAST MEDICAL HISTORY:  Past Medical History:  Diagnosis Date   Anemia    CHF (congestive heart failure) (HCC)    Chronic back pain    COPD (chronic obstructive pulmonary disease) (HCC)    on  2L home o2     Edema    Esophageal reflux    Fatigue    Fibromyalgia    Following with pain management   Hypertension    Nocturia    OA (osteoarthritis)    Sleep apnea    not on CPAP    SOCIAL HX:  Social History   Tobacco Use   Smoking status: Former Smoker    Quit date: 11/04/1983    Years since quitting: 35.2   Smokeless tobacco: Never Used   Tobacco comment: quit several years ago - almost 30 years ago  Substance Use Topics   Alcohol use: No    Alcohol/week: 0.0 standard drinks    ALLERGIES:  Allergies  Allergen Reactions   Gabapentin     Pt reports dizziness and abnormal urine labs.   Tramadol Nausea And Vomiting   Vioxx [Rofecoxib]     No reaction listed on MAR.     PERTINENT MEDICATIONS:  Outpatient Encounter Medications as of 01/17/2019  Medication Sig   albuterol (PROVENTIL) (2.5 MG/3ML) 0.083% nebulizer solution USE ONE TREATMENT EVERY 6 HOURS AS NEEDED FOR WHEEZING OR SHORTNESS OF BREATH   ANORO ELLIPTA 62.5-25 MCG/INH AEPB Inhale 1 puff into the lungs daily.   azelastine (ASTELIN) 0.1 % nasal spray Place 1 spray into both nostrils daily.   busPIRone (BUSPAR) 15 MG tablet Take 7.5 mg by mouth daily. 1/2 tablet daily    cholecalciferol (VITAMIN D) 1000 UNITS tablet Take 1,000 Units by mouth daily.    Cranberry 475 MG CAPS Take by mouth. 1 po daily   diclofenac sodium (VOLTAREN) 1 % GEL Apply 4 g topically 4 (four) times daily.   fluticasone (FLONASE) 50 MCG/ACT nasal spray USE TWO SPRAYS IN EACH NOSTRIL EVERY DAY   furosemide (LASIX) 40 MG tablet Take 1 tablet (40 mg total) by mouth 2 (two) times daily.   guaiFENesin (MUCINEX) 600 MG 12 hr tablet Take 1,200 mg by mouth every 6 (six) hours as needed.   hydroxypropyl methylcellulose / hypromellose (ISOPTO TEARS / GONIOVISC) 2.5 % ophthalmic solution Place 1 drop into both eyes.   lactulose (CHRONULAC) 10 GM/15ML solution Take by mouth 3 (three) times daily. 45gm po tid   Lidocaine (HM LIDOCAINE  PATCH) 4 % PTCH Apply topically. Apply once a day   loratadine (CLARITIN) 10 MG tablet Take 10 mg by mouth daily.   magnesium oxide (MAG-OX) 400 MG tablet Take 400 mg by mouth daily.   meclizine (ANTIVERT) 25 MG tablet Take 25 mg by mouth every 8 (eight) hours.   nystatin (MYCOSTATIN/NYSTOP) powder nystatin 100,000 unit/gram topical powder   omeprazole (PRILOSEC) 20 MG capsule Take 20 mg by mouth daily.   ondansetron (ZOFRAN) 4 MG tablet Take 4 mg by mouth every 8 (eight) hours as needed for nausea or vomiting.   oxybutynin (DITROPAN-XL) 5 MG 24 hr tablet Take 5 mg by mouth daily.   oxycodone (OXY-IR) 5 MG capsule Take 5 mg by mouth. 1 po q8hrs as needed for pain   PERFOROMIST 20 MCG/2ML nebulizer solution    polyethylene glycol (MIRALAX / GLYCOLAX) 17 g packet Take 17 g by mouth daily.   pregabalin (LYRICA) 200 MG capsule Take 200 mg by mouth 2 (two) times daily.   Probiotic Product (ALIGN) 4 MG CAPS Take 4 mg by mouth daily.   QUEtiapine (SEROQUEL XR) 50 MG TB24 24 hr tablet Take 1 tablet (50 mg total) by mouth at bedtime. (Patient taking differently: Take 75 mg by mouth  at bedtime. )   rOPINIRole (REQUIP) 0.25 MG tablet Take 0.25 mg by mouth at bedtime. 3 tablets qhs   No facility-administered encounter medications on file as of 01/17/2019.     PHYSICAL EXAM:   General: obese, debilitated, pleasant female Cardiovascular: regular rate and rhythm Pulmonary: clear ant fields Abdomen: soft, nontender, + bowel sounds Extremities: + edema, no joint deformities Neurological: functionally quadriplegic  Daryn Pisani Prince [place], NP

## 2019-01-29 ENCOUNTER — Other Ambulatory Visit: Payer: Self-pay

## 2019-01-29 ENCOUNTER — Encounter: Payer: Self-pay | Admitting: Nurse Practitioner

## 2019-01-29 ENCOUNTER — Non-Acute Institutional Stay: Payer: Medicare Other | Admitting: Nurse Practitioner

## 2019-01-29 VITALS — BP 105/59 | HR 82 | Temp 98.2°F | Resp 18 | Wt 295.2 lb

## 2019-01-29 DIAGNOSIS — Z515 Encounter for palliative care: Secondary | ICD-10-CM

## 2019-01-29 DIAGNOSIS — I509 Heart failure, unspecified: Secondary | ICD-10-CM

## 2019-01-29 NOTE — Progress Notes (Signed)
Therapist, nutritional Palliative Care Consult Note Telephone: [phone]  Fax: [phone]  PATIENT NAME: Loretta Wade DOB: 24-Jan-1951 MRN: [id number] PRIMARY CARE PROVIDER:Dr Kathleen Lime PROVIDER:Dr Hodges/[facility] RESPONSIBLE PARTY:Self/Brandon Forsee son [phone]  RECOMMENDATIONS and PLAN: 1.ACP: full code; full scope interventions  2.Generalized weaknesssecondary to obesity, CHF. Encourage energy conservation and rest times.  3.Dyspneic secondary to congestive heart failure remain stable at present time. Continue daily weights; recent covid infection currently negative  4.Palliative care encounter Palliative medicine team will continue to support patient, patient's family, and medical team. Visit consisted of counseling and education dealing with the complex and emotionally intense issues of symptom management and palliative care in the setting of serious and potentially life-threatening illness  I spent 45 minutes providing this consultation,  from 1:00pm to 1:45pm. More than 50% of the time in this consultation was spent coordinating communication.   HISTORY OF PRESENT ILLNESS:  Loretta Wade is a 68 y.o. year old female with multiple medical problems including congestive heart failure, morbid obesity, for full vascular disease, anemia, chronic back pain, COPD, on oxygen continuous, fibromyalgia, gerd, hypertension, osteoarthritis, obstructive sleep apnea, hyperlipidemia, opiate dependent, adjustment disorder, major depression with hallucinations, vitamin D deficiency.  Loretta Wade continues to reside in [facility] at [facility]. 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 She does require assistance with ADLs and toileting. She does feed herself after tray set up an appetite has improved. She has recovered from covid-19 infection. Loretta Wade does require  continuous oxygen. Staff endorses Loretta. Boord continues to have delusions have people talking about her or trying to hurt her or her family. Psychotherapy and Psychiatry continues to follow. Last primary provider note 11 / 11 / 2020 for cystitis, anxiety and moderate protein calorie malnutrition. At present Loretta Blackbear is lying in bed. She appears obese, debilitated but no distress. No visitors present. I visited and observed Loretta Wade. We talked about purpose for palliative care visit and she smiled and said thank you for coming to see me. We talked about symptoms of pain and shortness of breath when she denies. We talked about her appetite which has been improving. We talked about the challenges residing in a facility and her wishes to be independent. We talked about her delusions and concerns. We talked about her being in the safe environment, Staff that cares for her. We talked about her son. We talked about quality of life. Some discussions were limited due to focus on delusions but was redirectable. Therapeutic listening an emotional support provided. We talked about role of palliative care and plan of care. Discuss with Loretta Wade will follow up in one month if needed her sooner she declined. Loretta Wade in agreement. Loretta. Wade smiled and said she was grateful therapeutic listening an emotional support provided. We talked about role of palliative care and plan of care. Discuss with Loretta Wade will follow up in one month if needed her sooner she declined. Loretta Gaitor in agreement. She smiled and said she was thankful for palliative care visit and support. No new changes to current goals or plan of care. Medical goals continue to focus on treat what is treatable, her wishes are to remain a full code with aggressive interventions. I have updated nursing staff  Palliative Care was asked to help to continue to address goals of care.   CODE STATUS: Full code  PPS: 30% HOSPICE ELIGIBILITY/DIAGNOSIS:  TBD  PAST MEDICAL HISTORY:  Past Medical History:  Diagnosis Date   Anemia    CHF (congestive heart failure) (HCC)    Chronic back pain    COPD (chronic obstructive pulmonary disease) (HCC)    on 2L home o2   Edema    Esophageal reflux    Fatigue    Fibromyalgia    Following with pain management   Hypertension    Nocturia    OA (osteoarthritis)    Sleep apnea    not on CPAP    SOCIAL HX:  Social History   Tobacco Use   Smoking status: Former Smoker    Quit date: 11/04/1983    Years since quitting: 35.2   Smokeless tobacco: Never Used   Tobacco comment: quit several years ago - almost 30 years ago  Substance Use Topics   Alcohol use: No    Alcohol/week: 0.0 standard drinks    ALLERGIES:  Allergies  Allergen Reactions   Gabapentin     Pt reports dizziness and abnormal urine labs.   Tramadol Nausea And Vomiting   Vioxx [Rofecoxib]     No reaction listed on MAR.     PERTINENT MEDICATIONS:  Outpatient Encounter Medications as of 01/29/2019  Medication Sig   albuterol (PROVENTIL) (2.5 MG/3ML) 0.083% nebulizer solution USE ONE TREATMENT EVERY 6 HOURS AS NEEDED FOR WHEEZING OR SHORTNESS OF BREATH   ANORO ELLIPTA 62.5-25 MCG/INH AEPB Inhale 1 puff into the lungs daily.   azelastine (ASTELIN) 0.1 % nasal spray Place 1 spray into both nostrils daily.   busPIRone (BUSPAR) 15 MG tablet Take 7.5 mg by mouth daily. 1/2 tablet daily    cholecalciferol (VITAMIN D) 1000 UNITS tablet Take 1,000 Units by mouth daily.    Cranberry 475 MG CAPS Take by mouth. 1 po daily   diclofenac sodium (VOLTAREN) 1 % GEL Apply 4 g topically 4 (four) times daily.   fluticasone (FLONASE) 50 MCG/ACT nasal spray USE TWO SPRAYS IN EACH NOSTRIL EVERY DAY   furosemide (LASIX) 40 MG tablet Take 1 tablet (40 mg total) by mouth 2 (two) times daily.   guaiFENesin (MUCINEX) 600 MG 12 hr tablet Take 1,200 mg by mouth every 6 (six) hours as needed.   hydroxypropyl methylcellulose  / hypromellose (ISOPTO TEARS / GONIOVISC) 2.5 % ophthalmic solution Place 1 drop into both eyes.   lactulose (CHRONULAC) 10 GM/15ML solution Take by mouth 3 (three) times daily. 45gm po tid   Lidocaine (HM LIDOCAINE PATCH) 4 % PTCH Apply topically. Apply once a day   loratadine (CLARITIN) 10 MG tablet Take 10 mg by mouth daily.   magnesium oxide (MAG-OX) 400 MG tablet Take 400 mg by mouth daily.   meclizine (ANTIVERT) 25 MG tablet Take 25 mg by mouth every 8 (eight) hours.   nystatin (MYCOSTATIN/NYSTOP) powder nystatin 100,000 unit/gram topical powder   omeprazole (PRILOSEC) 20 MG capsule Take 20 mg by mouth daily.   ondansetron (ZOFRAN) 4 MG tablet Take 4 mg by mouth every 8 (eight) hours as needed for nausea or vomiting.   oxybutynin (DITROPAN-XL) 5 MG 24 hr tablet Take 5 mg by mouth daily.   oxycodone (OXY-IR) 5 MG capsule Take 5 mg by mouth. 1 po q8hrs as needed for pain   PERFOROMIST 20 MCG/2ML nebulizer solution    polyethylene glycol (MIRALAX / GLYCOLAX) 17 g packet Take 17 g by mouth daily.   pregabalin (LYRICA) 200 MG capsule Take 200 mg by mouth 2 (two) times daily.   Probiotic Product (ALIGN)  4 MG CAPS Take 4 mg by mouth daily.   QUEtiapine (SEROQUEL XR) 50 MG TB24 24 hr tablet Take 1 tablet (50 mg total) by mouth at bedtime. (Patient taking differently: Take 75 mg by mouth at bedtime. )   rOPINIRole (REQUIP) 0.25 MG tablet Take 0.25 mg by mouth at bedtime. 3 tablets qhs   No facility-administered encounter medications on file as of 01/29/2019.     PHYSICAL EXAM:   General: obese, chronically ill female Cardiovascular: regular rate and rhythm Pulmonary: poor air movement Abdomen: obese; soft, nontender, + bowel sounds Extremities: + edema, no joint deformities Neurological: functionally quadriplegic  Graycen Sadlon Prince [place], NP

## 2019-02-14 ENCOUNTER — Non-Acute Institutional Stay: Payer: Medicare Other | Admitting: Nurse Practitioner

## 2019-02-14 ENCOUNTER — Encounter: Payer: Self-pay | Admitting: Nurse Practitioner

## 2019-02-14 VITALS — BP 109/64 | HR 71 | Temp 97.9°F | Resp 18 | Wt 295.2 lb

## 2019-02-14 DIAGNOSIS — I509 Heart failure, unspecified: Secondary | ICD-10-CM

## 2019-02-14 DIAGNOSIS — Z515 Encounter for palliative care: Secondary | ICD-10-CM

## 2019-02-14 NOTE — Progress Notes (Signed)
Therapist, nutritional Palliative Care Consult Note Telephone: [phone]  Fax: [phone]  PATIENT NAME: Loretta Wade DOB: 26-Mar-1950 MRN: [id number] PRIMARY CARE PROVIDER:Dr Kathleen Lime PROVIDER:Dr Hodges/[facility] RESPONSIBLE PARTY:Self/Brandon Yeley son [phone]  RECOMMENDATIONS and PLAN: 1.ACP: full code; full scope interventions  2.Generalized weaknesssecondary to obesity, CHF. Encourage energy conservation and rest times.  3.Dyspneic secondary to congestive heart failure remain stable at present time. Continue daily weights; recent covid infection currently negative  4.Palliative care encounter Palliative medicine team will continue to support patient, patient's family, and medical team. Visit consisted of counseling and education dealing with the complex and emotionally intense issues of symptom management and palliative care in the setting of serious and potentially life-threatening illness  I spent 35 minutes providing this consultation,  from 1:30pm to 2:05pm. More than 50% of the time in this consultation was spent coordinating communication.   HISTORY OF PRESENT ILLNESS:  Loretta Wade is a 68 y.o. year old female with multiple medical problems including congestive heart failure, morbid obesity, for full vascular disease, anemia, chronic back pain, COPD, on oxygen continuous, fibromyalgia, gerd, hypertension, osteoarthritis, obstructive sleep apnea, hyperlipidemia, opiate dependent, adjustment disorder, major depression with hallucinations, vitamin D deficiency. Loretta Wade continues to reside at [facility] at [facility]. Loretta. Wade remains bed-bound, total lift to a recliner for which stop does get her up several times a week. Loretta Wade is total ADL dependence including repositioning in the bed secondary to severe obesity. Loretta. Wade is able to feed  herself with a fair appetite. No recent Falls, hospitalizations. Loretta. Wade does continue to take Oxycodone 5 mg every 6 hours as needed for pain with 20 mg / 24 hours. Loretta. Wade does continue to take her lactulose non-compliance at times.  12 / 8 / 2020 ammonia level 136. Loretta Wade did receive a lactulose enema. Loretta. Wade has had a covid-19 infection. completed her quarantine days. Loretta. Wade does remain a full code.  Last primary provider visit 12 / 2 / 2020 for encephalopathy and atelectasis. Encephalopathy ongoing and stable presently with ammonia level normal with treatment of oral lactulose and intermittent enema. Loretta. Wade is followed by Psychiatry with less data service 12 / 3 / 2020 with schizoaffective disorder depressive type requiring Seroquel which was increased at this visit due to increase in delusions of her son dying, delusional disorder and anxiety for what Loretta. Slone is tolerating Buspar. I visited and observed Loretta Wade. We talked about purpose or palliative care visit and she was an agreement. We talked about how she was feeling today. Loretta. Wade replies that she is doing okay. Loretta. Wade endorse she would like to have her nails done. Loretta. Wade currently was not wearing her oxygen and shared that she forgot to put it back on. Assisted Loretta Wade and placing her oxygen back on correctly. We talked about symptoms of pain which she denies. We talked about shortness of breath. Loretta. Josefine Wade endorses that she does get short of breath at times but currently uncomfortable. Loretta. Wade does get relief with her nebulizers. We talked about mobility and getting out of bed. Loretta. Wade shared that she had gotten up about a week ago but since she was been having trouble with her ammonia. Loretta. Wade endorses staff has been reluctant to get her out of bed due to the result from the light [employer]. It is more difficult to get her cleaned up  in a chair. We talked about her  lactulose in compliance. We talked about the ammonia level. We talked about her concerns about staff and delusions. We talked about her son and this [place] indoors says that she recently talked to him on the phone. We talked about quality of life. We talked about challenges to residing at skilled facility. We talked about challenges relying on others for essential needs. We talked about medical goals of care as she was very appropriate during palliative care visit with minimal delusions. We talked about code status as previously she had been a DNR. Her wishes are to We talked about code status as previously she had been a DNR and currently now a full code. Loretta Wade endorses that she does want to have CPR and aggressive interventions, placed on a ventilator if necessary. Loretta Wade endorses she also does not want to be sustained long-term on a ventilator. We talked about medical goals of care. Discuss that at present time Loretta Wade will remain a full code with aggressive interventions and we'll make notation in chart about wishes not to be sustained long-term on a ventilator. We talked about role of palliative care and plan of care. We talked about next palliative care visit in 4 weeks if needed or sooner should she declined. Loretta. Wade in agreement. Therapeutic listening and emotional support provided. Questions answered to satisfaction. Loretta. Wade express she was very thankful for palliative care visit and support. I have updated nursing staff. I have updated [facility] nurse practitioner.    Palliative Care was asked to help to continue to address goals of care.   CODE STATUS: Full code  PPS: 30% HOSPICE ELIGIBILITY/DIAGNOSIS: TBD  PAST MEDICAL HISTORY:  Past Medical History:  Diagnosis Date   Anemia    CHF (congestive heart failure) (HCC)    Chronic back pain    COPD (chronic obstructive pulmonary disease) (HCC)    on 2L home o2   Edema    Esophageal reflux    Fatigue     Fibromyalgia    Following with pain management   Hypertension    Nocturia    OA (osteoarthritis)    Sleep apnea    not on CPAP    SOCIAL HX:  Social History   Tobacco Use   Smoking status: Former Smoker    Quit date: 11/04/1983    Years since quitting: 35.3   Smokeless tobacco: Never Used   Tobacco comment: quit several years ago - almost 30 years ago  Substance Use Topics   Alcohol use: No    Alcohol/week: 0.0 standard drinks    ALLERGIES:  Allergies  Allergen Reactions   Gabapentin     Pt reports dizziness and abnormal urine labs.   Tramadol Nausea And Vomiting   Vioxx [Rofecoxib]     No reaction listed on MAR.     PERTINENT MEDICATIONS:  Outpatient Encounter Medications as of 02/14/2019  Medication Sig   albuterol (PROVENTIL) (2.5 MG/3ML) 0.083% nebulizer solution USE ONE TREATMENT EVERY 6 HOURS AS NEEDED FOR WHEEZING OR SHORTNESS OF BREATH   ANORO ELLIPTA 62.5-25 MCG/INH AEPB Inhale 1 puff into the lungs daily.   azelastine (ASTELIN) 0.1 % nasal spray Place 1 spray into both nostrils daily.   busPIRone (BUSPAR) 15 MG tablet Take 7.5 mg by mouth daily. 1/2 tablet daily    cholecalciferol (VITAMIN D) 1000 UNITS tablet Take 1,000 Units by mouth daily.    Cranberry 475 MG CAPS Take by mouth. 1 po daily  diclofenac sodium (VOLTAREN) 1 % GEL Apply 4 g topically 4 (four) times daily.   fluticasone (FLONASE) 50 MCG/ACT nasal spray USE TWO SPRAYS IN EACH NOSTRIL EVERY DAY   furosemide (LASIX) 40 MG tablet Take 1 tablet (40 mg total) by mouth 2 (two) times daily.   guaiFENesin (MUCINEX) 600 MG 12 hr tablet Take 1,200 mg by mouth every 6 (six) hours as needed.   hydroxypropyl methylcellulose / hypromellose (ISOPTO TEARS / GONIOVISC) 2.5 % ophthalmic solution Place 1 drop into both eyes.   lactulose (CHRONULAC) 10 GM/15ML solution Take by mouth 3 (three) times daily. 45gm po tid   Lidocaine (HM LIDOCAINE PATCH) 4 % PTCH Apply topically. Apply once a day     loratadine (CLARITIN) 10 MG tablet Take 10 mg by mouth daily.   magnesium oxide (MAG-OX) 400 MG tablet Take 400 mg by mouth daily.   meclizine (ANTIVERT) 25 MG tablet Take 25 mg by mouth every 8 (eight) hours.   nystatin (MYCOSTATIN/NYSTOP) powder nystatin 100,000 unit/gram topical powder   omeprazole (PRILOSEC) 20 MG capsule Take 20 mg by mouth daily.   ondansetron (ZOFRAN) 4 MG tablet Take 4 mg by mouth every 8 (eight) hours as needed for nausea or vomiting.   oxybutynin (DITROPAN-XL) 5 MG 24 hr tablet Take 5 mg by mouth daily.   oxycodone (OXY-IR) 5 MG capsule Take 5 mg by mouth. 1 po q8hrs as needed for pain   PERFOROMIST 20 MCG/2ML nebulizer solution    polyethylene glycol (MIRALAX / GLYCOLAX) 17 g packet Take 17 g by mouth daily.   pregabalin (LYRICA) 200 MG capsule Take 200 mg by mouth 2 (two) times daily.   Probiotic Product (ALIGN) 4 MG CAPS Take 4 mg by mouth daily.   QUEtiapine (SEROQUEL XR) 50 MG TB24 24 hr tablet Take 1 tablet (50 mg total) by mouth at bedtime. (Patient taking differently: Take 75 mg by mouth at bedtime. )   rOPINIRole (REQUIP) 0.25 MG tablet Take 0.25 mg by mouth at bedtime. 3 tablets qhs   No facility-administered encounter medications on file as of 02/14/2019.    PHYSICAL EXAM:   General: NAD, obese, bedbound pleasant female Cardiovascular: regular rate and rhythm Pulmonary: clear ant fields Abdomen: obese, soft, nontender, + bowel sounds Extremities: + edema, no joint deformities Neurological: functional quadriplegic  Soren Pigman Prince [place], NP

## 2019-02-17 ENCOUNTER — Other Ambulatory Visit: Payer: Self-pay

## 2019-04-18 ENCOUNTER — Non-Acute Institutional Stay: Payer: Medicare Other | Admitting: Nurse Practitioner

## 2019-04-18 ENCOUNTER — Encounter: Payer: Self-pay | Admitting: Nurse Practitioner

## 2019-04-18 VITALS — BP 106/62 | HR 87 | Temp 97.1°F | Resp 18 | Wt 238.5 lb

## 2019-04-18 DIAGNOSIS — Z515 Encounter for palliative care: Secondary | ICD-10-CM

## 2019-04-18 DIAGNOSIS — I509 Heart failure, unspecified: Secondary | ICD-10-CM

## 2019-04-18 NOTE — Progress Notes (Signed)
Therapist, nutritional Palliative Care Consult Note Telephone: [phone]  Fax: [phone]  PATIENT NAME: Loretta Wade DOB: 1950/04/09 MRN: [id number] PRIMARY CARE PROVIDER:Dr Loretta Wade PROVIDER:Dr Hodges/[facility] RESPONSIBLE PARTY:Loretta Wade [phone] or [phone] Wade  RECOMMENDATIONS and PLAN: 1.ACP: full code; full scope interventions  2.Generalized weaknesssecondary to obesity, CHF. Encourage energy conservation and rest times.  3.Dyspneic secondary to congestive heart failure remain stable at present time. Continue daily weights; recent covid infection currently negative  4.Palliative care encounter Palliative medicine team will continue to support patient, patient's family, and medical team. Visit consisted of counseling and education dealing with the complex and emotionally intense issues of symptom management and palliative care in the setting of serious and potentially life-threatening illness  I spent 45 minutes providing this consultation,  from 11:15am to 12:00pm. More than 50% of the time in this consultation was spent coordinating communication.   HISTORY OF PRESENT ILLNESS:  Loretta Wade is a 69 y.o. year old female with multiple medical problems including congestive heart failure, morbid obesity, for full vascular disease, anemia, chronic back pain, COPD, on oxygen continuous, fibromyalgia, gerd, hypertension, osteoarthritis, obstructive sleep apnea, hyperlipidemia, opiate dependent, adjustment disorder, major depression with hallucinations, vitamin D deficiency. Loretta. Wade continues to reside at [facility] in [facility]. Loretta. Wade is bed-bound secondary to severe obesity. Loretta Wade remains on continuous oxygen. Loretta. Wade does require stop to turn in position, proper with pillows. Loretta. Wade get hoyer to a chair but has not been out of  bed in weeks. This [place] does require ADL dependents Total Care, incontinence. Loretta. Wade require assistance for feeding. 2 / 2 / 2021 weight 238.5 lb. Regular diet, regular texture. Appetite has thing declined. Last primary provider note 1 / 20 / 2021 for monthly follow-up. Labile ammonia levels she does receive po lactulose when she will take it. Last ammonia 1/4 / 2021 was 102. Medical goals to focus more on Comfort. Full code change to DNR, do not intubate and do not hospitalize. Focus on Comfort. Loretta Wade continues to be followed by Psychotherapy in addition to Psychiatry at the facility. Last psychiatric visit 2 / 4 / 2021 for Schizoaffective disorder and sad prescribed Citalopram for mood and Seroquel for delusions, paranoia. Loretta Wade also takes Buspar for anxiety. No recent hospitalizations, Falls, wounds. Loretta Wade has had covid-infection with recurrent UTIs. Loretta. Wade overall continues to decline clinically as she is more confused per stop over the last week despite being compliant with lactulose. Staff endorses Loretta Wade is becoming more shortness of breath with positioning and moving. Staff endorses Loretta Wade is overall declining. At present Loretta Wade is lying in bed. Loretta Wade is confused she does open her eyes to verbal cues but then goes right back to sleep. I visited and observed Loretta. Wade. Loretta. Wade did not make eye contact to verbal cues though otherwise closed her eyes. No verbal response. She does appear comfortable though very lethargic and weak. Loretta Wade was cooperative with assessment. Emotional support provided. I called and Loretta Wade, Loretta Wade. We talked about purpose of palliative care visit. Loretta Loretta Wade in agreement. We talked about Loretta Wade being very familiar to palliative care as she has received palliative on and off for the last 5 years. We talked about previous Hospice Services though discharge due to stability. We talked about past medical  history in the center of chronic disease and progression. We talked  about palliative care visit today with Loretta Wade. We talked about overall decline, debility and progression. We talked about symptoms of increased confusion despite compliance with lactulose. We talked about medical goals of care including aggressive versus Comfort Care. Recent MOST and DNR form completed including do not hospitalize. We talked about option of revisiting Hospice Services. Loretta Loretta Wade in agreement for Hospice Services as she is her Wade and responsible party since Loretta Wade is confused. Loretta Loretta Wade in agreement to proceed with hospice at the facility. We talked about role of palliative care and plan of care. Therapeutic listening and emotional support provided. Contact information provided. Questions answered to satisfaction. I updated nursing staff and [facility] nurse practitioner for hospice order  10 / 19 / 2020 weight 294.0 lbs 2 / 2 / 2021 weight 238.5 lbs 55lb weight loss in 3 months  Palliative Care was asked to help to continue to address goals of care.   CODE STATUS: DNR  PPS: 30% HOSPICE ELIGIBILITY/DIAGNOSIS: After review with Hospice Physician appears <6 months life expectancy with clinical condition  PAST MEDICAL HISTORY:  Past Medical History:  Diagnosis Date  . Anemia   . CHF (congestive heart failure) (HCC)   . Chronic back pain   . COPD (chronic obstructive pulmonary disease) (HCC)    on 2L home o2  . Edema   . Esophageal reflux   . Fatigue   . Fibromyalgia    Following with pain management  . Hypertension   . Nocturia   . OA (osteoarthritis)   . Sleep apnea    not on CPAP    SOCIAL HX:  Social History   Tobacco Use  . Smoking status: Former Smoker    Quit date: 11/04/1983    Years since quitting: 35.4  . Smokeless tobacco: Never Used  . Tobacco comment: quit several years ago - almost 30 years ago  Substance Use Topics  . Alcohol use: No    Alcohol/week: 0.0 standard drinks     ALLERGIES:  Allergies  Allergen Reactions  . Gabapentin     Pt reports dizziness and abnormal urine labs.  . Tramadol Nausea And Vomiting  . Vioxx [Rofecoxib]     No reaction listed on MAR.     PERTINENT MEDICATIONS:  Outpatient Encounter Medications as of 04/18/2019  Medication Sig  . albuterol (PROVENTIL) (2.5 MG/3ML) 0.083% nebulizer solution USE ONE TREATMENT EVERY 6 HOURS AS NEEDED FOR WHEEZING OR SHORTNESS OF BREATH  . ANORO ELLIPTA 62.5-25 MCG/INH AEPB Inhale 1 puff into the lungs daily.  Marland Kitchen azelastine (ASTELIN) 0.1 % nasal spray Place 1 spray into both nostrils daily.  . busPIRone (BUSPAR) 15 MG tablet Take 7.5 mg by mouth daily. 1/2 tablet daily   . cholecalciferol (VITAMIN D) 1000 UNITS tablet Take 1,000 Units by mouth daily.   . Cranberry 475 MG CAPS Take by mouth. 1 po daily  . diclofenac sodium (VOLTAREN) 1 % GEL Apply 4 g topically 4 (four) times daily.  . fluticasone (FLONASE) 50 MCG/ACT nasal spray USE TWO SPRAYS IN EACH NOSTRIL EVERY DAY  . furosemide (LASIX) 40 MG tablet Take 1 tablet (40 mg total) by mouth 2 (two) times daily.  Marland Kitchen guaiFENesin (MUCINEX) 600 MG 12 hr tablet Take 1,200 mg by mouth every 6 (six) hours as needed.  . hydroxypropyl methylcellulose / hypromellose (ISOPTO TEARS / GONIOVISC) 2.5 % ophthalmic solution Place 1 drop into both eyes.  Marland Kitchen lactulose (CHRONULAC) 10 GM/15ML solution Take by mouth 3 (three) times daily. 45gm  po tid  . Lidocaine (HM LIDOCAINE PATCH) 4 % PTCH Apply topically. Apply once a day  . loratadine (CLARITIN) 10 MG tablet Take 10 mg by mouth daily.  . magnesium oxide (MAG-OX) 400 MG tablet Take 400 mg by mouth daily.  . meclizine (ANTIVERT) 25 MG tablet Take 25 mg by mouth every 8 (eight) hours.  Marland Kitchen nystatin (MYCOSTATIN/NYSTOP) powder nystatin 100,000 unit/gram topical powder  . omeprazole (PRILOSEC) 20 MG capsule Take 20 mg by mouth daily.  . ondansetron (ZOFRAN) 4 MG tablet Take 4 mg by mouth every 8 (eight) hours as needed for  nausea or vomiting.  Marland Kitchen oxybutynin (DITROPAN-XL) 5 MG 24 hr tablet Take 5 mg by mouth daily.  Marland Kitchen oxycodone (OXY-IR) 5 MG capsule Take 5 mg by mouth. 1 po q8hrs as needed for pain  . PERFOROMIST 20 MCG/2ML nebulizer solution   . polyethylene glycol (MIRALAX / GLYCOLAX) 17 g packet Take 17 g by mouth daily.  . pregabalin (LYRICA) 200 MG capsule Take 200 mg by mouth 2 (two) times daily.  . Probiotic Product (ALIGN) 4 MG CAPS Take 4 mg by mouth daily.  . QUEtiapine (SEROQUEL XR) 50 MG TB24 24 hr tablet Take 1 tablet (50 mg total) by mouth at bedtime. (Patient taking differently: Take 75 mg by mouth at bedtime. )  . rOPINIRole (REQUIP) 0.25 MG tablet Take 0.25 mg by mouth at bedtime. 3 tablets qhs   No facility-administered encounter medications on file as of 04/18/2019.    PHYSICAL EXAM:   General: obese, debilitated, chonically ill, lethargic, confused female Cardiovascular: regular rate and rhythm Pulmonary: clear ant fields Abdomen: soft, nontender, + bowel sounds Extremities: + edema, no joint deformities Neurological: functionally quadriplegic  Marelly Wehrman Prince [place], NP

## 2019-04-21 ENCOUNTER — Other Ambulatory Visit: Payer: Self-pay

## 2019-05-05 DEATH — deceased
